# Patient Record
Sex: Male | Born: 1935 | Race: White | Hispanic: No | Smoking: Former smoker
Health system: Southern US, Community
[De-identification: ages and names within clinical notes are randomized; demographics above are authoritative.]

## PROBLEM LIST (undated history)

## (undated) DIAGNOSIS — Z86010 Personal history of colonic polyps: Secondary | ICD-10-CM

## (undated) DIAGNOSIS — I10 Essential (primary) hypertension: Secondary | ICD-10-CM

## (undated) DIAGNOSIS — T7840XA Allergy, unspecified, initial encounter: Secondary | ICD-10-CM

## (undated) DIAGNOSIS — C4491 Basal cell carcinoma of skin, unspecified: Secondary | ICD-10-CM

## (undated) DIAGNOSIS — E785 Hyperlipidemia, unspecified: Secondary | ICD-10-CM

## (undated) DIAGNOSIS — I2510 Atherosclerotic heart disease of native coronary artery without angina pectoris: Secondary | ICD-10-CM

## (undated) HISTORY — DX: Hyperlipidemia, unspecified: E78.5

## (undated) HISTORY — PX: COLONOSCOPY: SHX174

## (undated) HISTORY — DX: Atherosclerotic heart disease of native coronary artery without angina pectoris: I25.10

## (undated) HISTORY — DX: Allergy, unspecified, initial encounter: T78.40XA

## (undated) HISTORY — DX: Personal history of colonic polyps: Z86.010

## (undated) HISTORY — PX: MOHS SURGERY: SHX181

## (undated) HISTORY — PX: CATARACT EXTRACTION: SUR2

## (undated) HISTORY — DX: Essential (primary) hypertension: I10

## (undated) HISTORY — PX: EYE SURGERY: SHX253

## (undated) HISTORY — DX: Basal cell carcinoma of skin, unspecified: C44.91

## (undated) HISTORY — PX: HERNIA REPAIR: SHX51

---

## 1998-01-27 HISTORY — PX: CORONARY ARTERY BYPASS GRAFT: SHX141

## 1998-11-23 ENCOUNTER — Inpatient Hospital Stay (HOSPITAL_COMMUNITY): Admission: AD | Admit: 1998-11-23 | Discharge: 1998-12-03 | Payer: Self-pay | Admitting: Cardiology

## 1998-11-23 ENCOUNTER — Encounter: Payer: Self-pay | Admitting: Cardiology

## 1998-11-24 ENCOUNTER — Encounter: Payer: Self-pay | Admitting: Thoracic Surgery (Cardiothoracic Vascular Surgery)

## 1998-11-26 ENCOUNTER — Encounter: Payer: Self-pay | Admitting: Thoracic Surgery (Cardiothoracic Vascular Surgery)

## 1998-11-27 ENCOUNTER — Encounter: Payer: Self-pay | Admitting: Thoracic Surgery (Cardiothoracic Vascular Surgery)

## 1998-11-28 ENCOUNTER — Encounter: Payer: Self-pay | Admitting: Thoracic Surgery (Cardiothoracic Vascular Surgery)

## 1998-11-29 ENCOUNTER — Encounter: Payer: Self-pay | Admitting: Thoracic Surgery (Cardiothoracic Vascular Surgery)

## 1998-11-30 ENCOUNTER — Encounter: Payer: Self-pay | Admitting: Thoracic Surgery (Cardiothoracic Vascular Surgery)

## 1999-02-05 ENCOUNTER — Encounter (HOSPITAL_COMMUNITY): Admission: RE | Admit: 1999-02-05 | Discharge: 1999-05-06 | Payer: Self-pay | Admitting: Family Medicine

## 2000-03-24 ENCOUNTER — Encounter: Payer: Self-pay | Admitting: Surgery

## 2000-03-26 ENCOUNTER — Ambulatory Visit (HOSPITAL_COMMUNITY): Admission: RE | Admit: 2000-03-26 | Discharge: 2000-03-26 | Payer: Self-pay | Admitting: Surgery

## 2004-03-04 ENCOUNTER — Ambulatory Visit: Payer: Self-pay | Admitting: Family Medicine

## 2004-03-13 ENCOUNTER — Ambulatory Visit: Payer: Self-pay | Admitting: Internal Medicine

## 2004-03-19 ENCOUNTER — Ambulatory Visit: Payer: Self-pay | Admitting: Internal Medicine

## 2004-03-22 ENCOUNTER — Ambulatory Visit: Payer: Self-pay | Admitting: Internal Medicine

## 2004-05-27 ENCOUNTER — Ambulatory Visit: Payer: Self-pay | Admitting: Family Medicine

## 2004-12-05 ENCOUNTER — Ambulatory Visit: Payer: Self-pay | Admitting: Family Medicine

## 2005-01-30 ENCOUNTER — Encounter: Admission: RE | Admit: 2005-01-30 | Discharge: 2005-01-30 | Payer: Self-pay | Admitting: Family Medicine

## 2005-01-30 ENCOUNTER — Ambulatory Visit: Payer: Self-pay | Admitting: Family Medicine

## 2005-12-10 ENCOUNTER — Ambulatory Visit: Payer: Self-pay | Admitting: Family Medicine

## 2006-02-10 ENCOUNTER — Ambulatory Visit: Payer: Self-pay | Admitting: Family Medicine

## 2006-02-10 LAB — CONVERTED CEMR LAB
ALT: 15 units/L (ref 0–40)
AST: 20 units/L (ref 0–37)
Alkaline Phosphatase: 66 units/L (ref 39–117)
BUN: 12 mg/dL (ref 6–23)
Basophils Absolute: 0 10*3/uL (ref 0.0–0.1)
Basophils Relative: 0.3 % (ref 0.0–1.0)
Calcium: 9.5 mg/dL (ref 8.4–10.5)
Cholesterol: 122 mg/dL (ref 0–200)
Creatinine, Ser: 1.2 mg/dL (ref 0.4–1.5)
Eosinophils Relative: 0.6 % (ref 0.0–5.0)
Glucose, Bld: 91 mg/dL (ref 70–99)
HCT: 47.3 % (ref 39.0–52.0)
HDL: 35 mg/dL — ABNORMAL LOW (ref >39.0)
Hemoglobin: 15.8 g/dL (ref 13.0–17.0)
LDL Cholesterol: 59 mg/dL (ref 0–99)
Lymphocytes Relative: 26.1 % (ref 12.0–46.0)
MCHC: 33.4 g/dL (ref 30.0–36.0)
MCV: 91.7 fL (ref 78.0–100.0)
Monocytes Absolute: 0.4 10*3/uL (ref 0.2–0.7)
Monocytes Relative: 6.7 % (ref 3.0–11.0)
Neutro Abs: 4 10*3/uL (ref 1.4–7.7)
Neutrophils Relative %: 66.3 % (ref 43.0–77.0)
PSA: 2.92 ng/mL (ref 0.10–4.00)
Platelets: 256 10*3/uL (ref 150–400)
Potassium: 4.2 meq/L (ref 3.5–5.1)
RBC: 5.16 M/uL (ref 4.22–5.81)
RDW: 12.2 % (ref 11.5–14.6)
Sodium: 139 meq/L (ref 135–145)
TSH: 3.99 microintl units/mL (ref 0.35–5.50)
Total CHOL/HDL Ratio: 3.5
Triglycerides: 138 mg/dL (ref 0–149)
VLDL: 28 mg/dL (ref 0–40)
WBC: 5.9 10*3/uL (ref 4.5–10.5)

## 2006-02-17 ENCOUNTER — Ambulatory Visit: Payer: Self-pay | Admitting: Family Medicine

## 2006-05-14 ENCOUNTER — Ambulatory Visit: Payer: Self-pay | Admitting: Family Medicine

## 2006-12-03 ENCOUNTER — Ambulatory Visit: Payer: Self-pay | Admitting: Family Medicine

## 2007-02-22 ENCOUNTER — Ambulatory Visit: Payer: Self-pay | Admitting: Family Medicine

## 2007-02-22 DIAGNOSIS — I10 Essential (primary) hypertension: Secondary | ICD-10-CM | POA: Insufficient documentation

## 2007-02-22 DIAGNOSIS — E785 Hyperlipidemia, unspecified: Secondary | ICD-10-CM | POA: Insufficient documentation

## 2007-02-22 DIAGNOSIS — B3749 Other urogenital candidiasis: Secondary | ICD-10-CM | POA: Insufficient documentation

## 2007-02-22 LAB — CONVERTED CEMR LAB
ALT: 13 units/L (ref 0–53)
AST: 19 units/L (ref 0–37)
Albumin: 3.9 g/dL (ref 3.5–5.2)
Basophils Absolute: 0 10*3/uL (ref 0.0–0.1)
Basophils Relative: 0.1 % (ref 0.0–1.0)
Blood in Urine, dipstick: NEGATIVE
Calcium: 9.6 mg/dL (ref 8.4–10.5)
Chloride: 102 meq/L (ref 96–112)
Eosinophils Absolute: 0 10*3/uL (ref 0.0–0.6)
Eosinophils Relative: 0.4 % (ref 0.0–5.0)
GFR calc non Af Amer: 63 mL/min
HCT: 44.7 % (ref 39.0–52.0)
Hemoglobin: 15.2 g/dL (ref 13.0–17.0)
Lymphocytes Relative: 28.1 % (ref 12.0–46.0)
MCHC: 34.1 g/dL (ref 30.0–36.0)
Monocytes Relative: 8.1 % (ref 3.0–11.0)
Nitrite: NEGATIVE
PSA: 4.1 ng/mL — ABNORMAL HIGH (ref 0.10–4.00)
Protein, U semiquant: NEGATIVE
RDW: 11.9 % (ref 11.5–14.6)
Specific Gravity, Urine: 1.025
TSH: 3.85 microintl units/mL (ref 0.35–5.50)
WBC Urine, dipstick: NEGATIVE

## 2007-03-02 ENCOUNTER — Ambulatory Visit: Payer: Self-pay | Admitting: Family Medicine

## 2007-03-02 DIAGNOSIS — I25810 Atherosclerosis of coronary artery bypass graft(s) without angina pectoris: Secondary | ICD-10-CM

## 2007-03-02 DIAGNOSIS — I25701 Atherosclerosis of coronary artery bypass graft(s), unspecified, with angina pectoris with documented spasm: Secondary | ICD-10-CM | POA: Insufficient documentation

## 2007-10-13 ENCOUNTER — Telehealth: Payer: Self-pay | Admitting: Family Medicine

## 2007-11-10 ENCOUNTER — Ambulatory Visit: Payer: Self-pay | Admitting: Family Medicine

## 2008-03-02 ENCOUNTER — Ambulatory Visit: Payer: Self-pay | Admitting: Family Medicine

## 2008-03-09 ENCOUNTER — Ambulatory Visit: Payer: Self-pay | Admitting: Internal Medicine

## 2008-09-14 DIAGNOSIS — S61209A Unspecified open wound of unspecified finger without damage to nail, initial encounter: Secondary | ICD-10-CM | POA: Insufficient documentation

## 2008-09-28 ENCOUNTER — Ambulatory Visit: Payer: Self-pay | Admitting: Family Medicine

## 2008-10-03 ENCOUNTER — Ambulatory Visit: Payer: Self-pay | Admitting: Family Medicine

## 2008-10-03 ENCOUNTER — Encounter: Payer: Self-pay | Admitting: Family Medicine

## 2008-10-05 DIAGNOSIS — D2330 Other benign neoplasm of skin of unspecified part of face: Secondary | ICD-10-CM

## 2008-10-16 DIAGNOSIS — L0201 Cutaneous abscess of face: Secondary | ICD-10-CM

## 2008-10-16 DIAGNOSIS — L03211 Cellulitis of face: Secondary | ICD-10-CM

## 2008-10-18 ENCOUNTER — Ambulatory Visit: Payer: Self-pay | Admitting: Family Medicine

## 2008-10-31 ENCOUNTER — Ambulatory Visit: Payer: Self-pay | Admitting: Family Medicine

## 2008-11-07 ENCOUNTER — Ambulatory Visit: Payer: Self-pay | Admitting: Family Medicine

## 2009-03-27 ENCOUNTER — Ambulatory Visit: Payer: Self-pay | Admitting: Family Medicine

## 2009-03-27 DIAGNOSIS — N400 Enlarged prostate without lower urinary tract symptoms: Secondary | ICD-10-CM | POA: Insufficient documentation

## 2009-04-02 ENCOUNTER — Telehealth (INDEPENDENT_AMBULATORY_CARE_PROVIDER_SITE_OTHER): Payer: Self-pay | Admitting: Radiology

## 2009-04-03 ENCOUNTER — Ambulatory Visit: Payer: Self-pay

## 2009-04-03 ENCOUNTER — Ambulatory Visit: Payer: Self-pay | Admitting: Cardiovascular Disease

## 2009-04-03 ENCOUNTER — Encounter (HOSPITAL_COMMUNITY): Admission: RE | Admit: 2009-04-03 | Discharge: 2009-05-29 | Payer: Self-pay | Admitting: Family Medicine

## 2009-04-13 ENCOUNTER — Encounter (INDEPENDENT_AMBULATORY_CARE_PROVIDER_SITE_OTHER): Payer: Self-pay | Admitting: *Deleted

## 2009-04-30 ENCOUNTER — Encounter (INDEPENDENT_AMBULATORY_CARE_PROVIDER_SITE_OTHER): Payer: Self-pay

## 2009-05-01 ENCOUNTER — Ambulatory Visit: Payer: Self-pay | Admitting: Internal Medicine

## 2009-05-16 ENCOUNTER — Ambulatory Visit: Payer: Self-pay | Admitting: Internal Medicine

## 2009-12-10 ENCOUNTER — Ambulatory Visit: Payer: Self-pay | Admitting: Family Medicine

## 2010-02-24 LAB — CONVERTED CEMR LAB
ALT: 15 units/L (ref 0–53)
AST: 24 units/L (ref 0–37)
Albumin: 3.9 g/dL (ref 3.5–5.2)
Alkaline Phosphatase: 63 units/L (ref 39–117)
BUN: 13 mg/dL (ref 6–23)
BUN: 15 mg/dL (ref 6–23)
Basophils Absolute: 0 10*3/uL (ref 0.0–0.1)
Bilirubin Urine: NEGATIVE
Bilirubin, Direct: 0.1 mg/dL (ref 0.0–0.3)
Bilirubin, Direct: 0.2 mg/dL (ref 0.0–0.3)
Blood in Urine, dipstick: NEGATIVE
Blood in Urine, dipstick: NEGATIVE
Chloride: 114 meq/L — ABNORMAL HIGH (ref 96–112)
Cholesterol: 120 mg/dL (ref 0–200)
Creatinine, Ser: 1.1 mg/dL (ref 0.4–1.5)
Eosinophils Absolute: 0 10*3/uL (ref 0.0–0.7)
Eosinophils Relative: 0.4 % (ref 0.0–5.0)
Eosinophils Relative: 0.6 % (ref 0.0–5.0)
GFR calc non Af Amer: 69.57 mL/min (ref >60)
GFR calc non Af Amer: 78 mL/min
Glucose, Bld: 99 mg/dL (ref 70–99)
Glucose, Urine, Semiquant: NEGATIVE
HDL: 32.5 mg/dL — ABNORMAL LOW (ref >39.0)
HDL: 32.7 mg/dL — ABNORMAL LOW (ref >39.0)
Ketones, urine, test strip: NEGATIVE
LDL Cholesterol: 57 mg/dL (ref 0–99)
Lymphs Abs: 1.2 10*3/uL (ref 0.7–4.0)
MCV: 90.3 fL (ref 78.0–100.0)
MCV: 93.3 fL (ref 78.0–100.0)
Monocytes Absolute: 0.3 10*3/uL (ref 0.1–1.0)
Monocytes Relative: 7 % (ref 3.0–12.0)
Neutrophils Relative %: 67 % (ref 43.0–77.0)
Neutrophils Relative %: 67.7 % (ref 43.0–77.0)
Nitrite: NEGATIVE
PSA: 3.74 ng/mL (ref 0.10–4.00)
Platelets: 197 10*3/uL (ref 150.0–400.0)
Platelets: 210 10*3/uL (ref 150–400)
Potassium: 4.5 meq/L (ref 3.5–5.1)
Protein, U semiquant: NEGATIVE
RDW: 12.3 % (ref 11.5–14.6)
RDW: 12.3 % (ref 11.5–14.6)
Sodium: 144 meq/L (ref 135–145)
TSH: 3.22 microintl units/mL (ref 0.35–5.50)
Total Bilirubin: 0.9 mg/dL (ref 0.3–1.2)
Total Bilirubin: 0.9 mg/dL (ref 0.3–1.2)
Total CHOL/HDL Ratio: 3.8
Triglycerides: 126 mg/dL (ref 0.0–149.0)
Triglycerides: 137 mg/dL (ref 0–149)
Urobilinogen, UA: 0.2
VLDL: 22 mg/dL (ref 0–40)
VLDL: 25.2 mg/dL (ref 0.0–40.0)
WBC Urine, dipstick: NEGATIVE
WBC: 4.9 10*3/uL (ref 4.5–10.5)
WBC: 5.4 10*3/uL (ref 4.5–10.5)
pH: 6.5
pH: 6.5

## 2010-02-26 NOTE — Assessment & Plan Note (Signed)
Summary: FLU SHOT // RS  Flu Vaccine Consent Questions     Do you have a history of severe allergic reactions to this vaccine? no    Any prior history of allergic reactions to egg and/or gelatin? no    Do you have a sensitivity to the preservative Thimersol? no    Do you have a past history of Guillan-Barre Syndrome? no    Do you currently have an acute febrile illness? no    Have you ever had a severe reaction to latex? no    Vaccine information given and explained to patient? yes    Are you currently pregnant? no    Lot Number:[id number]   Exp Date:07/27/2010   Site Given  Left Deltoid IM  Nurse Visit   Allergies: 1)  ! * Bee Sting  Orders Added: 1)  Flu Vaccine 26yrs + MEDICARE PATIENTS [Q2039] 2)  Administration Flu vaccine - MCR [G0008]

## 2010-02-26 NOTE — Assessment & Plan Note (Signed)
Summary: emp -will fast//ccm   Vital Signs:  Patient profile:   77 year old male Weight:      180 pounds BMI:     28.08 Temp:     97.9 degrees F oral Pulse rate:   48 / minute Pulse rhythm:   regular BP sitting:   104 / 70  (left arm) Cuff size:   regular  Vitals Entered By: Raechel Ache, RN (March 27, 2009 9:15 AM) CC: OV, fasting.   CC:  OV and fasting.[name]  History of Present Illness: Robert Pacheco is a 77 year old, married male, nonsmoker, who comes in today for multiple issues.  He is a history of underlying coronary artery disease.  He spent asymptomatic.  No chest pain.  He has a history of hypertension, for which he takes a beta-blocker, 50 mg one half tablet b.i.d.  He takes Zocor 20 mg nightly one half tablet for hyperlipidemia.  Will check lipid panel today.  He also takes aspirin, niacin, saw palmetto.  He also use Nizoral for fungal infections in the groin area.  Tetanus booster 2006 Pneumovax 2009 seasonal flu 2000 and  His last cardiac stress test was about 3 years ago.  His heart surgery was about 11 years ago.  He denies any chest pain, shortness of breath, decreased exercise tolerance, etc.  Allergies: 1)  ! * Bee Sting  Past History:  Past medical, surgical, family and social histories (including risk factors) reviewed, and no changes noted (except as noted below).  Past Medical History: Reviewed history from 03/02/2007 and no changes required. Hyperlipidemia Hypertension cabs 2000 allergic rhinitis  Past Surgical History: Reviewed history from 03/02/2008 and no changes required. Coronary artery bypass graft   2000  Family History: Reviewed history from 03/02/2007 and no changes required. father died at 20 of an MI.  Smoker mother died 43 motor vehicle accident 3 brothers in good health 5 sisters in good health  Social History: Reviewed history from 02/22/2007 and no changes required. Retired Married Never Smoked Alcohol use-no Drug  use-no Regular exercise-yes  Review of Systems      See HPI  Physical Exam  General:  Well-developed,well-nourished,in no acute distress; alert,appropriate and cooperative throughout examination Head:  Normocephalic and atraumatic without obvious abnormalities. No apparent alopecia or balding. Eyes:  No corneal or conjunctival inflammation noted. EOMI. Perrla. Funduscopic exam benign, without hemorrhages, exudates or papilledema. Vision grossly normal. Ears:  External ear exam shows no significant lesions or deformities.  Otoscopic examination reveals clear canals, tympanic membranes are intact bilaterally without bulging, retraction, inflammation or discharge. Hearing is grossly normal bilaterally. Nose:  External nasal examination shows no deformity or inflammation. Nasal mucosa are pink and moist without lesions or exudates. Mouth:  Oral mucosa and oropharynx without lesions or exudates.  Teeth in good repair. Neck:  No deformities, masses, or tenderness noted. Chest Wall:  No deformities, masses, tenderness or gynecomastia noted. Breasts:  No masses or gynecomastia noted Lungs:  Normal respiratory effort, chest expands symmetrically. Lungs are clear to auscultation, no crackles or wheezes. Heart:  Normal rate and regular rhythm. S1 and S2 normal without gallop, murmur, click, rub or other extra sounds. Abdomen:  Bowel sounds positive,abdomen soft and non-tender without masses, organomegaly or hernias noted. Rectal:  No external abnormalities noted. Normal sphincter tone. No rectal masses or tenderness. Genitalia:  Testes bilaterally descended without nodularity, tenderness or masses. No scrotal masses or lesions. No penis lesions or urethral discharge. Prostate:  no nodules, no asymmetry, and  2+ enlarged.   Msk:  No deformity or scoliosis noted of thoracic or lumbar spine.   Pulses:  R and L carotid,radial,femoral,dorsalis pedis and posterior tibial pulses are full and equal  bilaterally Extremities:  No clubbing, cyanosis, edema, or deformity noted with normal full range of motion of all joints.   Neurologic:  No cranial nerve deficits noted. Station and gait are normal. Plantar reflexes are down-going bilaterally. DTRs are symmetrical throughout. Sensory, motor and coordinative functions appear intact. Skin:  Intact without suspicious lesions or rashes Cervical Nodes:  No lymphadenopathy noted Axillary Nodes:  No palpable lymphadenopathy Inguinal Nodes:  No significant adenopathy Psych:  Cognition and judgment appear intact. Alert and cooperative with normal attention span and concentration. No apparent delusions, illusions, hallucinations   Impression & Recommendations:  Problem # 1:  CORONARY ATHEROSCLEROSIS OF ARTERY BYPASS GRAFT (ICD-414.04) Assessment Unchanged  His updated medication list for this problem includes:    Metoprolol Succinate 50 Mg Tb24 (Metoprolol succinate) .[name]... 1/2 am 1/2 pm    Adult Aspirin Low Strength 81 Mg Tbdp (Aspirin)    Nitrostat 0.4 Mg Subl (Nitroglycerin) ..... Uad  Orders: Cardiology Referral (Cardiology) Prescription Created Electronically [phone]) UA Dipstick w/o Micro (automated)  (81003) Venipuncture (98119) TLB-Lipid Panel (80061-LIPID) TLB-BMP (Basic Metabolic Panel-BMET) (80048-METABOL) TLB-CBC Platelet - w/Differential (85025-CBCD) TLB-Hepatic/Liver Function Pnl (80076-HEPATIC) TLB-TSH (Thyroid Stimulating Hormone) (84443-TSH) TLB-PSA (Prostate Specific Antigen) (84153-PSA)  Problem # 2:  HYPERTENSION (ICD-401.9) Assessment: Improved  His updated medication list for this problem includes:    Metoprolol Succinate 50 Mg Tb24 (Metoprolol succinate) .[name]... 1/2 am 1/2 pm  Orders: Prescription Created Electronically [phone]) UA Dipstick w/o Micro (automated)  (81003) Venipuncture (95621) TLB-Lipid Panel (80061-LIPID) TLB-BMP (Basic Metabolic Panel-BMET) (80048-METABOL) TLB-CBC Platelet - w/Differential  (85025-CBCD) TLB-Hepatic/Liver Function Pnl (80076-HEPATIC) TLB-TSH (Thyroid Stimulating Hormone) (84443-TSH) TLB-PSA (Prostate Specific Antigen) (84153-PSA) EKG w/ Interpretation (93000)  Problem # 3:  HYPERLIPIDEMIA (ICD-272.4) Assessment: Improved  His updated medication list for this problem includes:    Zocor 20 Mg Tabs (Simvastatin) .[name]... 1/2 tab once daily    Niacin 500 Mg Tabs (Niacin) .[name]... Take 1 tablet by mouth once a day otc  Orders: Prescription Created Electronically [phone]) UA Dipstick w/o Micro (automated)  (81003) Venipuncture (78469) TLB-Lipid Panel (80061-LIPID) TLB-BMP (Basic Metabolic Panel-BMET) (80048-METABOL) TLB-CBC Platelet - w/Differential (85025-CBCD) TLB-Hepatic/Liver Function Pnl (80076-HEPATIC) TLB-TSH (Thyroid Stimulating Hormone) (84443-TSH) TLB-PSA (Prostate Specific Antigen) (84153-PSA)  Problem # 4:  CANDIDIASIS OF OTHER UROGENITAL SITES (ICD-112.2) Assessment: Improved  Orders: Prescription Created Electronically [phone]) UA Dipstick w/o Micro (automated)  (81003) Venipuncture (84132) TLB-Lipid Panel (80061-LIPID) TLB-BMP (Basic Metabolic Panel-BMET) (80048-METABOL) TLB-CBC Platelet - w/Differential (85025-CBCD) TLB-Hepatic/Liver Function Pnl (80076-HEPATIC) TLB-TSH (Thyroid Stimulating Hormone) (84443-TSH) TLB-PSA (Prostate Specific Antigen) (84153-PSA)  Problem # 5:  BENIGN PROSTATIC HYPERTROPHY, WITH OBSTRUCTION (ICD-600.01) Assessment: New  Orders: Prescription Created Electronically [phone]) UA Dipstick w/o Micro (automated)  (81003) Venipuncture (27253) TLB-Lipid Panel (80061-LIPID) TLB-BMP (Basic Metabolic Panel-BMET) (80048-METABOL) TLB-CBC Platelet - w/Differential (85025-CBCD) TLB-Hepatic/Liver Function Pnl (80076-HEPATIC) TLB-TSH (Thyroid Stimulating Hormone) (84443-TSH) TLB-PSA (Prostate Specific Antigen) (84153-PSA)  Complete Medication List: 1)  Metoprolol Succinate 50 Mg Tb24 (Metoprolol succinate) .... 1/2 am 1/2  pm 2)  Zocor 20 Mg Tabs (Simvastatin) .... 1/2 tab once daily 3)  Adult Aspirin Low Strength 81 Mg Tbdp (Aspirin) 4)  Fish Oil  .... Two times a day otc 5)  Niacin 500 Mg Tabs (Niacin) .... Take 1 tablet by mouth once a day otc 6)  Saw Palmetto 450  .... Two times a day  otc 7)  Ketoconazole 2 % Crea (Ketoconazole) .... Apply b.i.d. 8)  Nitrostat 0.4 Mg Subl (Nitroglycerin) .... Uad  Patient Instructions: 1)  Please schedule a follow-up appointment in 1 year. 2)  It is important that you exercise regularly at least 20 minutes 5 times a week. If you develop chest pain, have severe difficulty breathing, or feel very tired , stop exercising immediately and seek medical attention. 3)  Schedule a colonoscopy/sigmoidoscopy to help detect colon cancer. 4)  Take an Aspirin every day 5)  we will get to set up for a cardiac stress test. Prescriptions: NITROSTAT 0.4 MG  SUBL (NITROGLYCERIN) UAD  #25 x 1   Entered and Authorized by:   Roderick Pee MD   Signed by:   Roderick Pee MD on 03/27/2009   Method used:   Electronically to        [facility] [phone]* (retail)       [address] [place], [place]  [address]       Ph: [phone]       Fax: [phone]   RxID:   [id number] KETOCONAZOLE 2 %  CREA (KETOCONAZOLE) apply b.i.d.  #60gr. x 3   Entered and Authorized by:   Roderick Pee MD   Signed by:   Roderick Pee MD on 03/27/2009   Method used:   Electronically to        [facility] [phone]* (retail)       [address] [place], [place]  [address]       Ph: [phone]       Fax: [phone]   RxID:   [id number] NIACIN 500 MG  TABS (NIACIN) Take 1 tablet by mouth once a day otc  #100 x 3   Entered and Authorized by:   Roderick Pee MD   Signed by:   Roderick Pee MD on 03/27/2009   Method used:   Electronically to        [facility] [phone]* (retail)       [address] [place], [place]  [address]       Ph: [phone]       Fax: [phone]    RxID:   [id number] ZOCOR 20 MG  TABS (SIMVASTATIN) 1/2 TAB once daily  #50 x 3   Entered and Authorized by:   Roderick Pee MD   Signed by:   Roderick Pee MD on 03/27/2009   Method used:   Electronically to        [facility] [phone]* (retail)       [address] [place], [place]  [address]       Ph: [phone]       Fax: [phone]   RxID:   [id number] METOPROLOL SUCCINATE 50 MG  TB24 (METOPROLOL SUCCINATE) 1/2 AM 1/2 PM  #100 x 3   Entered and Authorized by:   Roderick Pee MD   Signed by:   Roderick Pee MD on 03/27/2009   Method used:   Electronically to        [facility] [phone]* (retail)       [address] [place], [place]  [address]       Ph: [phone]  Fax: [phone]   RxID:   [id number]     Laboratory Results   Urine Tests  Date/Time Recieved: March 27, 2009 1:23 PM  Date/Time Reported: March 27, 2009 1:23 PM   Routine Urinalysis   Color: yellow Appearance: Clear Glucose: negative   (Normal Range: Negative) Bilirubin: negative   (Normal Range: Negative) Ketone: negative   (Normal Range: Negative) Spec. Gravity: 1.020   (Normal Range: 1.003-1.035) Blood: negative   (Normal Range: Negative) pH: 6.5   (Normal Range: 5.0-8.0) Protein: negative   (Normal Range: Negative) Urobilinogen: 0.2   (Normal Range: 0-1) Nitrite: negative   (Normal Range: Negative) Leukocyte Esterace: negative   (Normal Range: Negative)    Comments: Wynona Canes, CMA  March 27, 2009 1:23 PM

## 2010-02-26 NOTE — Letter (Signed)
Summary: [facility] Instructions  [facility]  [address] [place], [place] [address]   Phone: [phone]  Fax: [phone]       Robert Pacheco    [birth year]    MRN: [id number]        Procedure Day /Date:  05/16/09  Wednesday     Arrival Time:  7:30am      Procedure Time: 8:30am     Location of Procedure:                    _ x_  [facility] (4th Floor)   PREPARATION FOR COLONOSCOPY WITH MOVIPREP   Starting 5 days prior to your procedure _ 4/15/11_ do not eat nuts, seeds, popcorn, corn, beans, peas,  salads, or any raw vegetables.  Do not take any fiber supplements (e.g. Metamucil, Citrucel, and Benefiber).  THE DAY BEFORE YOUR PROCEDURE         DATE:  05/15/09  DAY:  Tuesday  1.  Drink clear liquids the entire day-NO SOLID FOOD  2.  Do not drink anything colored red or purple.  Avoid juices with pulp.  No orange juice.  3.  Drink at least 64 oz. (8 glasses) of fluid/clear liquids during the day to prevent dehydration and help the prep work efficiently.  CLEAR LIQUIDS INCLUDE: Water Jello Ice Popsicles Tea (sugar ok, no milk/cream) Powdered fruit flavored drinks Coffee (sugar ok, no milk/cream) Gatorade Juice: apple, white grape, white cranberry  Lemonade Clear bullion, consomm, broth Carbonated beverages (any kind) Strained chicken noodle soup Hard Candy                             4.  In the morning, mix first dose of MoviPrep solution:    Empty 1 Pouch A and 1 Pouch B into the disposable container    Add lukewarm drinking water to the top line of the container. Mix to dissolve    Refrigerate (mixed solution should be used within 24 hrs)  5.  Begin drinking the prep at 5:00 p.m. The MoviPrep container is divided by 4 marks.   Every 15 minutes drink the solution down to the next mark (approximately 8 oz) until the full liter is complete.   6.  Follow completed prep with 16 oz of clear liquid of your choice (Nothing red or purple).   Continue to drink clear liquids until bedtime.  7.  Before going to bed, mix second dose of MoviPrep solution:    Empty 1 Pouch A and 1 Pouch B into the disposable container    Add lukewarm drinking water to the top line of the container. Mix to dissolve    Refrigerate  THE DAY OF YOUR PROCEDURE      DATE:  05/16/09  DAY:  Wednesday  Beginning at 4:30 a.m. (5 hours before procedure):         1. Every 15 minutes, drink the solution down to the next mark (approx 8 oz) until the full liter is complete.  2. Follow completed prep with 16 oz. of clear liquid of your choice.    3. You may drink clear liquids until   6:30am (2 HOURS BEFORE PROCEDURE).   MEDICATION INSTRUCTIONS  Unless otherwise instructed, you should take regular prescription medications with a small sip of water   as early as possible the morning of your procedure.        OTHER INSTRUCTIONS  You will need a responsible adult at least 75 years of age to accompany you and drive you home.   This person must remain in the waiting room during your procedure.  Wear loose fitting clothing that is easily removed.  Leave jewelry and other valuables at home.  However, you may wish to bring a book to read or  an iPod/MP3 player to listen to music as you wait for your procedure to start.  Remove all body piercing jewelry and leave at home.  Total time from sign-in until discharge is approximately 2-3 hours.  You should go home directly after your procedure and rest.  You can resume normal activities the  day after your procedure.  The day of your procedure you should not:   Drive   Make legal decisions   Operate machinery   Drink alcohol   Return to work  You will receive specific instructions about eating, activities and medications before you leave.    The above instructions have been reviewed and explained to me by   [name]  May 01, 2009 9:22 AM     I fully understand and can verbalize  these instructions _____________________________ Date _________

## 2010-02-26 NOTE — Assessment & Plan Note (Signed)
Summary: Cardioogy Nuclear Study  Nuclear Med Background Indications for Stress Test: Evaluation for Ischemia, Graft Patency   History: CABG, GXT, Heart Catheterization  History Comments: 2000-Cath-3V disease- CABG(X4) 03/09/08- Negative GXT  Symptoms: Palpitations    Nuclear Pre-Procedure Cardiac Risk Factors: Family History - CAD, Hypertension, Lipids Caffeine/Decaff Intake: none NPO After: 6:00 PM Lungs: clear IV 0.9% NS with Angio Cath: 20g     IV Site: (R) wrist IV Started by: Stanton Kidney EMT-P Chest Size (in) 40     Height (in): 68 Weight (lb): 178 BMI: 27.16  Nuclear Med Study 1 or 2 day study:  1 day     Stress Test Type:  Stress Reading MD:  Charlton Haws, MD     Referring MD:  J.Todd Resting Radionuclide:  Technetium 45m Tetrofosmin     Resting Radionuclide Dose:  11.0 mCi  Stress Radionuclide:  Technetium 88m Tetrofosmin     Stress Radionuclide Dose:  33.0 mCi   Stress Protocol Exercise Time (min):  6:00 min     Max HR:  129 bpm     Predicted Max HR:  147 bpm  Max Systolic BP: 207 mm Hg     Percent Max HR:  87.76 %     METS: 7.0 Rate Pressure Product:  04540    Stress Test Technologist:  Milana Na EMT-P     Nuclear Technologist:  Burna Mortimer Deal RT-N  Rest Procedure  Myocardial perfusion imaging was performed at rest 45 minutes following the intravenous administration of Myoview Technetium 32m Tetrofosmin.  Stress Procedure  The patient exercised for 6:00. The patient stopped due to fatigue and denied any chest pain.  There were non specific ST-T wave changes and occ pacs/pvcs in pretest and recovery.  Myoview was injected at peak exercise and myocardial perfusion imaging was performed after a brief delay.  QPS Raw Data Images:  Normal; no motion artifact; normal heart/lung ratio. Stress Images:  NI: Uniform and normal uptake of tracer in all myocardial segments. Rest Images:  Normal homogeneous uptake in all areas of the myocardium. Subtraction (SDS):   Normal Transient Ischemic Dilatation:  1.00  (Normal <1.22)  Lung/Heart Ratio:  .31  (Normal <0.45)  Quantitative Gated Spect Images QGS EDV:  77 ml QGS ESV:  23 ml QGS EF:  70 % QGS cine images:  normal  Findings Normal nuclear study      Overall Impression  Exercise Capacity: Fair exercise capacity. BP Response: Hypertensive blood pressure response. Clinical Symptoms: No chest pain ECG Impression: No significant ST segment change suggestive of ischemia. Overall Impression: Normal stress nuclear study.  Appended Document: Cardioogy Nuclear Study  please call cardiac stress test, normal  Appended Document: Cardioogy Nuclear Study patient is aware

## 2010-02-26 NOTE — Letter (Signed)
Summary: Previsit letter  [facility]  [address] [place], [place] [address]   Phone: [phone]  Fax: [phone]       04/13/2007 MRN: [id number]  Robert Pacheco [address] [place], [place]  [address]  Dear [name],  Welcome to the Gastroenterology Division at [facility].    You are scheduled to see a nurse for your pre-procedure visit on 05/01/2009 at 9:00AM on the 3rd floor at [facility], [address]  We ask that you try to arrive at our office 15 minutes prior to your appointment time to allow for check-in.  Your nurse visit will consist of discussing your medical and surgical history, your immediate family medical history, and your medications.    Please bring a complete list of all your medications or, if you prefer, bring the medication bottles and we will list them.  We will need to be aware of both prescribed and over the counter drugs.  We will need to know exact dosage information as well.  If you are on blood thinners (Coumadin, Plavix, Aggrenox, Ticlid, etc.) please call our office today/prior to your appointment, as we need to consult with your physician about holding your medication.   Please be prepared to read and sign documents such as consent forms, a financial agreement, and acknowledgement forms.  If necessary, and with your consent, a friend or relative is welcome to sit-in on the nurse visit with you.  Please bring your insurance card so that we may make a copy of it.  If your insurance requires a referral to see a specialist, please bring your referral form from your primary care physician.  No co-pay is required for this nurse visit.     If you cannot keep your appointment, please call [phone] to cancel or reschedule prior to your appointment date.  This allows [place] the opportunity to schedule an appointment for another patient in need of care.    Thank you for choosing [facility] for your medical needs.   We appreciate the opportunity to care for you.  Please visit [place] at our website  to learn more about our practice.                     Sincerely.                                                                                                                   The Gastroenterology Division

## 2010-02-26 NOTE — Progress Notes (Signed)
Summary: Nuc Pre-Procedure  Phone Note Outgoing Call   Call placed by: Harlow Asa, CNMT Call placed to: Patient Reason for Call: Confirm/change Appt Summary of Call: Reviewed information on Myoview Information Sheet (see scanned document for further details).  Spoke with patient.     Nuclear Med Background Indications for Stress Test: Evaluation for Ischemia, Graft Patency   History: CABG, GXT, Heart Catheterization  History Comments: 2000-Cath-3V disease- CABG(X4) 03/09/08- Negative GXT     Nuclear Pre-Procedure Cardiac Risk Factors: Family History - CAD, Hypertension, Lipids Height (in): 67.25

## 2010-02-26 NOTE — Miscellaneous (Signed)
Summary: Lec previsit  Clinical Lists Changes  Medications: Added new medication of MOVIPREP 100 GM  SOLR (PEG-KCL-NACL-NASULF-NA ASC-C) As per prep instructions. - Signed Rx of MOVIPREP 100 GM  SOLR (PEG-KCL-NACL-NASULF-NA ASC-C) As per prep instructions.;  #1 x 0;  Signed;  Entered by: [name];  Authorized by: Iva Boop MD, [facility];  Method used: Electronically to [id number]*, [address] [place], [place]  [address], Ph: [phone], Fax: [phone] Observations: Added new observation of ALLERGY REV: Done (05/01/2009 8:48)    Prescriptions: MOVIPREP 100 GM  SOLR (PEG-KCL-NACL-NASULF-NA ASC-C) As per prep instructions.  #1 x 0   Entered by:   [name]   Authorized by:   Iva Boop MD, [facility]   Signed by:   [name] on 05/01/2009   Method used:   Electronically to        [facility] [phone]* (retail)       [address] [place], [place]  [address]       Ph: [phone]       Fax: [phone]   RxID:   [id number]

## 2010-02-26 NOTE — Procedures (Signed)
Summary: Colonoscopy  Patient: [name] Note: All result statuses are Final unless otherwise noted.  Tests: (1) Colonoscopy (COL)   COL Colonoscopy           DONE     [facility]     [address]     [place], [place]  [address]           COLONOSCOPY PROCEDURE REPORT           PATIENT:  Robert Pacheco, Robert Pacheco  MR#:  [id number]     BIRTHDATE:  08/06/1933, 76 yrs. old  GENDER:  male     ENDOSCOPIST:  Iva Boop, MD, [facility]           PROCEDURE DATE:  05/16/2009     PROCEDURE:  Colonoscopy with snare polypectomy     ASA CLASS:  Class II     INDICATIONS:  surveillance and high-risk screening, history of     pre-cancerous (adenomatous) colon polyps tubulovillous adenoma     removed 2006     MEDICATIONS:   Fentanyl 50 mcg IV, Versed 7 mg IV           DESCRIPTION OF PROCEDURE:   After the risks benefits and     alternatives of the procedure were thoroughly explained, informed     consent was obtained.  Digital rectal exam was performed and     revealed an enlarged prostate and no rectal masses.  Smooth,     moderately enlarged prostate. The LB CF-H180AL [id number] endoscope     was introduced through the anus and advanced to the cecum, which     was identified by both the appendix and ileocecal valve, without     limitations.  The quality of the prep was excellent, using     MoviPrep.  The instrument was then slowly withdrawn as the colon     was fully examined. Insertion: 6:40 minutes Withdrawal: 12:10     minutes     <<PROCEDUREIMAGES>>           FINDINGS:  There were multiple polyps identified and removed.     throughout the colon. Six polyps removed from cecum (1), hepatic     flexure (2), transverse colon (1), splenic flexure (1) and     descending colon (1). Largest 8 mm, smallest 1-2 mm (that one was     not recovered) Polyps were snared without cautery. Retrieval was     successful. snare polyp  This was otherwise a normal examination     of the colon.   Retroflexed  views in the rectum revealed no     abnormalities.    The scope was then withdrawn from the patient     and the procedure completed.           COMPLICATIONS:  None     ENDOSCOPIC IMPRESSION:     1) Polyps, multiple throughout the colon - six removed and five     recovered     2) Otherwise normal examination, excellent prep           3) Personal history of tubulovillous adenoma removal 2006.           REPEAT EXAM:  In for Colonoscopy, pending biopsy results.           Iva Boop, MD, [facility]           CC:  The Patient           n.  eSIGNED:   Iva Boop at 05/16/2009 09:28 AM           [name], [id number]  Note: An exclamation mark (!) indicates a result that was not dispersed into the flowsheet. Document Creation Date: 05/16/2009 9:29 AM _______________________________________________________________________  (1) Order result status: Final Collection or observation date-time: 05/16/2009 09:14 Requested date-time:  Receipt date-time:  Reported date-time:  Referring Physician:   Ordering Physician: Stan Head [phone]) Specimen Source:  Source: Launa Grill Order Number: [id number] Lab site:   Appended Document: Colonoscopy: 6 adenomas   Colonoscopy  Procedure date:  05/16/2009  Findings:      1) Polyps, multiple throughout the colon - six removed and five     recovered ADENOMAS     2) Otherwise normal examination, excellent prep           3) Personal history of tubulovillous adenoma removal 2006  Comments:      Repeat colonoscopy in 3 years.     Procedures Next Due Date:    Colonoscopy: 05/2012   Appended Document: Colonoscopy     Procedures Next Due Date:    Colonoscopy: 05/2012

## 2010-04-09 ENCOUNTER — Ambulatory Visit (INDEPENDENT_AMBULATORY_CARE_PROVIDER_SITE_OTHER): Payer: Medicare Other | Admitting: Family Medicine

## 2010-04-09 ENCOUNTER — Encounter: Payer: Self-pay | Admitting: Family Medicine

## 2010-04-09 DIAGNOSIS — D2330 Other benign neoplasm of skin of unspecified part of face: Secondary | ICD-10-CM

## 2010-04-09 DIAGNOSIS — I10 Essential (primary) hypertension: Secondary | ICD-10-CM

## 2010-04-09 DIAGNOSIS — N401 Enlarged prostate with lower urinary tract symptoms: Secondary | ICD-10-CM

## 2010-04-09 DIAGNOSIS — E785 Hyperlipidemia, unspecified: Secondary | ICD-10-CM

## 2010-04-09 DIAGNOSIS — I25810 Atherosclerosis of coronary artery bypass graft(s) without angina pectoris: Secondary | ICD-10-CM

## 2010-04-09 LAB — POCT URINALYSIS DIPSTICK
Bilirubin, UA: NEGATIVE
Blood, UA: NEGATIVE
Glucose, UA: NEGATIVE
Leukocytes, UA: NEGATIVE
Nitrite, UA: NEGATIVE

## 2010-04-09 LAB — CBC WITH DIFFERENTIAL/PLATELET
Basophils Relative: 0.3 % (ref 0.0–3.0)
Eosinophils Relative: 0.5 % (ref 0.0–5.0)
HCT: 45.5 % (ref 39.0–52.0)
MCV: 92.2 fl (ref 78.0–100.0)
Monocytes Absolute: 0.5 10*3/uL (ref 0.1–1.0)
Neutrophils Relative %: 69.8 % (ref 43.0–77.0)
RBC: 4.94 Mil/uL (ref 4.22–5.81)
WBC: 6.6 10*3/uL (ref 4.5–10.5)

## 2010-04-09 LAB — LIPID PANEL
Total CHOL/HDL Ratio: 4
Triglycerides: 163 mg/dL — ABNORMAL HIGH (ref 0.0–149.0)

## 2010-04-09 LAB — HEPATIC FUNCTION PANEL
ALT: 15 U/L (ref 0–53)
AST: 21 U/L (ref 0–37)
Bilirubin, Direct: 0.1 mg/dL (ref 0.0–0.3)
Total Protein: 6.9 g/dL (ref 6.0–8.3)

## 2010-04-09 LAB — BASIC METABOLIC PANEL
Chloride: 104 mEq/L (ref 96–112)
Creatinine, Ser: 1.1 mg/dL (ref 0.4–1.5)
Potassium: 5.1 mEq/L (ref 3.5–5.1)

## 2010-04-09 LAB — TSH: TSH: 4.77 u[IU]/mL (ref 0.35–5.50)

## 2010-04-09 LAB — PSA: PSA: 4.24 ng/mL — ABNORMAL HIGH (ref 0.10–4.00)

## 2010-04-09 MED ORDER — NITROGLYCERIN 0.4 MG SL SUBL: 0.4000 mg | SUBLINGUAL_TABLET | SUBLINGUAL | Status: DC | PRN

## 2010-04-09 MED ORDER — SIMVASTATIN 20 MG PO TABS: 20.0000 mg | ORAL_TABLET | Freq: Every day | ORAL | Status: DC

## 2010-04-09 MED ORDER — METOPROLOL SUCCINATE ER 50 MG PO TB24: ORAL_TABLET | ORAL | Status: DC

## 2010-04-09 NOTE — Progress Notes (Signed)
  Subjective:    Patient ID: Robert Pacheco, male    DOB: 1933/07/29, 77 y.o.   MRN: [id number]  Sakuma is a 77 year old, married male, nonsmoker, who comes in today for evaluation of multiple issues.  11 years ago.  He had bypass surgery.  He has done well.  No recurrence of chest pain.  He walks.  No chest pain or shortness of breath.  Cardiac stress test last year.  Normal.  He takes a beta-blocker 25 mg b.i.d. BP 110/70.  He is lightheaded when he stands out were cut, beta-blocker, and half.  He takes Zocor 20 mg nightly for hyperlipidemia.  Will check lipid panel.  He has BPH with outlet destruction currently urinating okay.  No medication indicated.  Review of systems otherwise negative except for an excoriated lesion left temple.  Advised to go back to the dermatologist    Review of Systems  Constitutional: Negative.   HENT: Negative.   Eyes: Negative.   Respiratory: Negative.   Cardiovascular: Negative.   Gastrointestinal: Negative.   Genitourinary: Negative.   Musculoskeletal: Negative.   Skin: Negative.   Neurological: Negative.   Hematological: Negative.   Psychiatric/Behavioral: Negative.        Objective:   Physical Exam  Constitutional: He is oriented to person, place, and time. He appears well-developed and well-nourished.  HENT:  Head: Normocephalic and atraumatic.  Right Ear: External ear normal.  Left Ear: External ear normal.  Nose: Nose normal.  Mouth/Throat: Oropharynx is clear and moist.  Eyes: Conjunctivae and EOM are normal. Pupils are equal, round, and reactive to light.  Neck: Normal range of motion. Neck supple. No JVD present. No tracheal deviation present. No thyromegaly present.  Cardiovascular: Normal rate, regular rhythm, normal heart sounds and intact distal pulses.  Exam reveals no gallop and no friction rub.   No murmur heard. Pulmonary/Chest: Effort normal and breath sounds normal. No stridor. No respiratory distress. He has no  wheezes. He has no rales. He exhibits no tenderness.  Abdominal: Soft. Bowel sounds are normal. He exhibits no distension and no mass. There is no tenderness. There is no rebound and no guarding.  Genitourinary: Rectum normal and penis normal. Guaiac negative stool. No penile tenderness.       3+ symmetrical.  BPH  Musculoskeletal: Normal range of motion. He exhibits no edema and no tenderness.  Lymphadenopathy:    He has no cervical adenopathy.  Neurological: He is alert and oriented to person, place, and time. He has normal reflexes. No cranial nerve deficit. He exhibits normal muscle tone.  Skin: Skin is warm and dry. No rash noted. No erythema. No pallor.  Psychiatric: He has a normal mood and affect. His behavior is normal. Judgment and thought content normal.          Assessment & Plan:  Coronary disease, status post bypass surgery.  No chest pain.............. Continue exercise.  Hypertension continue beta blocker, but I does to one half tab daily because systolic 110.  Hyperlipidemia.  Continue Zocor 20 nightly and an aspirin tablet.  BPH with outlet obstruction.  No medication indicated at this time.  Abnormal left forehead referred to dermatology

## 2010-04-09 NOTE — Patient Instructions (Signed)
Continue your current medications follow-up in one year or sooner if any problems 

## 2010-04-10 NOTE — Progress Notes (Signed)
Quick Note:  Attempt to call - ans mach - left msg that labs were normal ______

## 2010-04-24 ENCOUNTER — Other Ambulatory Visit: Payer: Self-pay | Admitting: Dermatology

## 2010-05-20 ENCOUNTER — Other Ambulatory Visit: Payer: Self-pay | Admitting: Dermatology

## 2010-05-23 ENCOUNTER — Other Ambulatory Visit: Payer: Self-pay | Admitting: Family Medicine

## 2010-05-23 NOTE — Telephone Encounter (Signed)
Time for an office visit 

## 2010-05-27 ENCOUNTER — Ambulatory Visit (INDEPENDENT_AMBULATORY_CARE_PROVIDER_SITE_OTHER): Payer: Medicare Other | Admitting: Family Medicine

## 2010-05-27 ENCOUNTER — Ambulatory Visit (INDEPENDENT_AMBULATORY_CARE_PROVIDER_SITE_OTHER)
Admission: RE | Admit: 2010-05-27 | Discharge: 2010-05-27 | Disposition: A | Discharge status: Home / Self Care | Payer: Medicare Other | Source: Ambulatory Visit | Attending: Family Medicine | Admitting: Family Medicine

## 2010-05-27 ENCOUNTER — Ambulatory Visit
Admission: RE | Admit: 2010-05-27 | Discharge: 2010-05-27 | Disposition: A | Discharge status: Home / Self Care | Payer: Medicare Other | Source: Ambulatory Visit | Attending: Family Medicine | Admitting: Family Medicine

## 2010-05-27 ENCOUNTER — Encounter: Payer: Self-pay | Admitting: Family Medicine

## 2010-05-27 VITALS — BP 132/72 | Temp 97.9°F | Ht 67.0 in | Wt 182.0 lb

## 2010-05-27 DIAGNOSIS — M545 Low back pain, unspecified: Secondary | ICD-10-CM

## 2010-05-27 MED ORDER — HYDROCODONE-ACETAMINOPHEN 7.5-750 MG PO TABS: ORAL_TABLET | ORAL | Status: DC

## 2010-05-27 NOTE — Progress Notes (Signed)
Addended by: Kern Reap on: 05/27/2010 12:25 PM   Modules accepted: Orders

## 2010-05-27 NOTE — Patient Instructions (Signed)
Go to the main office now for your back.  X-rays  Vicodin one half tab 3 times a day as needed for severe pain.  I will call you the x-ray report and then we will go from there
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
## 2010-05-29 ENCOUNTER — Telehealth: Payer: Self-pay | Admitting: *Deleted

## 2010-05-29 NOTE — Telephone Encounter (Signed)
Calling for x-ray results.

## 2010-05-29 NOTE — Telephone Encounter (Signed)
Left message on machine.

## 2010-05-29 NOTE — Telephone Encounter (Signed)
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

## 2010-05-29 NOTE — Telephone Encounter (Signed)
Left message on machine for patient  To return our call 

## 2010-05-30 NOTE — Progress Notes (Signed)
patient  Is aware 

## 2010-06-14 NOTE — Discharge Summary (Signed)
[place]  Patient:    Robert Pacheco                         MRN: [id number] Adm. Date:  [id number] Disc. Date: 12/03/98 Attending:  Evette Georges Dictator:   Luretha Rued Newman, P.A.C. CC:         Arturo Morton. Riley Kill, M.D. [facility]             Bruce H. Swords, M.D. [facility]             Steven C. Dorris Fetch, M.D./[facility]                           Discharge Summary  DATE OF BIRTH:  03-22-2033.  DATE OF SURGERY:  11/27/98.  ADMITTING DIAGNOSES: 1. Coronary artery disease. 2. Hyperlipidemia. 3. Positive family medical history of coronary artery disease.  DISCHARGE DIAGNOSIS:  Coronary artery disease, now status post coronary artery bypass grafting x 4, with placement of a left internal mammary artery to the left anterior descending artery, a right internal mammary artery to the right coronary artery and a left radial artery to the obtuse marginal 1 and obtuse marginal 2.  HISTORY OF PRESENT ILLNESS:  [name] is a 77 year old male patient of Drs. Shawnie Pons and [employer], with known coronary artery disease, who presented to Dr. Louretta Shorten office for a treadmill after having chest pain.  Upon treadmill, he had chest pain and EKG changes.  He is now being referred for cardiac catheterization.  HOSPITAL COURSE:  [name] was admitted to the hospital on 11/23/98 after having a positive exercise treadmill with EKG changes and chest pain.  He underwent coronary catheterization on 11/23/98 by Dr. Shawnie Pons and found to have three- vessel coronary artery disease.  Dr. Charlett Lango of [facility] was consulted for possible coronary artery bypass grafting.  After discussing the possibilities, risks and benefits of surgery with [name], the decision to proceed was agreed upon.  [name] underwent coronary artery bypass grafting on 11/27/98 by Dr. Charlett Lango.  He underwent coronary artery bypass grafting times  four, with placement of a left internal mammary artery to the left anterior descending, a right internal mammary artery to the right coronary artery and a left radial artery to the obtuse marginal 1 and obtuse marginal 2 sequentially.  He tolerated this  residual well, and was transferred to the SICU in stable condition.  His initial visit in postoperative SICU revealed stable hemodynamics with a cardiac index of 4.0.  He was being diuresed and we maintained his pulmonary toilet. The following day, he was followed throughout his hospital course by cardiology. His pleural tubes were discontinued on postop day 2, and the patient was transferred to unit 2000, in stable condition.  Continuation of his hospital course:  He continued to improve daily; he began ambulating without difficulty.  His activity was increased.  He had some episodes of poor appetite; however, this has resolved.  DISPOSITION:  The patient was deemed ready for discharge home on 12/03/98.  DISCHARGE MEDICATIONS:  Imdur 30 mg p.o. q.d., Cardizem 30 mg p.o. q.6h., enteric coated aspirin 325 mg p.o. q.d., Darvocet-N 100 one to two tablets q.4-6h. p.r.n. pain.  DISCHARGE INSTRUCTIONS:  No heavy lifting.  No strenuous activity.  No driving  car.  Keep wounds clean and dry.  He may shower as desired.  He has  a follow-up  with Dr. Riley Kill in two weeks.  He is to bring a chest x-ray from Dr. Louretta Shorten office to see Dr. Dorris Fetch in three weeks.  He is to follow-up with Dr. Cato Mulligan as needed. DD:  12/03/98 TD:  12/03/98 Job: [id number]

## 2010-06-14 NOTE — Op Note (Signed)
[facility]  Patient:    Robert Pacheco, Robert Pacheco                        MRN: [id number] Proc. Date: 03/26/00 Adm. Date:  [id number] Attending:  Charlton Haws CC:         Evette Georges, M.D. [facility]   Operative Report  CCS# [phone]  PREOPERATIVE DIAGNOSIS:  Left inguinal hernia.  POSTOPERATIVE DIAGNOSIS:  Left inguinal hernia, direct.  OPERATION PERFORMED:  Repair of direct left inguinal hernia with mesh.  SURGEON:  Currie Paris, M.D.  ANESTHESIA:  MAC.  INDICATIONS FOR PROCEDURE:  The patient is a 77 year old with a left inguinal hernia that he desired to have repaired.  DESCRIPTION OF PROCEDURE:  The patient was brought to the operating room having had the operative side identified preoperatively and marked.  He was given IV sedation and the left groin area was prepped and draped.  A combination of 1% Xylocaine plus 0.5% Marcaine was mixed with epinephrine and the Xylocaine was mixed equally.  This was used for local and infiltrated along the incision line plus above the anterior superior iliac spine. Incision was made and deepened to the external oblique aponeurosis with additional local infiltrated as needed and bleeders electrocoagulated or clamped, cut and tied with 4-0 Vicryl.  The external oblique was opened in line with its fibers and the external oblique aponeurosis lifted up off the underlying tissues.  The cord was identified and the ilioinguinal nerve kept with this while it was dissected up off the floor.  The cord appeared to be intact ____________ indirect sac.  There was a large direct hernia involving the entire floor.  This was freed up off the cord inferiorly where it was stuck a little bit to the symphysis pubis and reduced.  A large mesh patch was used to plug the hole and sutured with 2-0 Prolene to hold it in place.  This produced a nice closure and nothing else was protuding.  A final check was made of the cord for  an indirect sac and again, none was found.  The patch was then overlaid and sutured with a running Prolene inferiorly and tacked medially to the internal oblique and the tails crossed to go around the cord recreating a deep ring.  The wound was checked for hemostasis and everything appeared to be dry.  The incision was closed with 3-0 Vicryl to close the external oblique over the repair, 3-0 Vicryl in the Scarpas and 4-0 Monocryl subcuticular with Steri-Strips in the skin.  The patient tolerated the procedure well.  There were no operative complications.  All counts were correct. DD:  03/26/00 TD:  03/27/00 Job: [id number]

## 2010-06-14 NOTE — Cardiovascular Report (Signed)
[place]. [facility]  Patient:    Robert Pacheco                         MRN: [id number] Proc. Date: 11/23/98 Adm. Date:  [id number] Attending:  Evette Georges CC:         Valetta Mole Swords, M.D. [facility]             Evette Georges, M.D. [facility]             Cardiac Catheterization Laboratory                        Cardiac Catheterization  INDICATIONS:  Mr. Montanye is a pleasant 77 year old white male who has had a recent history of exertional chest tightness, exercise tolerance test was positive, and he was referred for cardiac catheterization.  PROCEDURES PERFORMED:  Left heart catheterization, selective coronary angiography, selective left ventriculography, subclavian angiography.  DESCRIPTION OF PROCEDURE:  The patient was performed from the right femoral artery using 6-French catheters.  We had to upgrade to a 7-French catheter and used a JL-3.0 in order to engage the left coronary system which had separate ostia. The procedure was then completed without complication.  He was taken to the holding  area for direct manual compression.  HEMODYNAMIC DATA:  The central aortic pressure was 139/80.  LV pressure 128/9.  There was no gradient on pullback across the aortic valve.  ANGIOGRAPHIC DATA:  There are separate ostia for the left anterior descending and left circumflex arteries.  1. The left anterior descending artery has 30% to 50% segmental diffuse plaquing in    the proximal vessel.  The vessel overall appears to be somewhat diffusely    diseased.  In the mid portion, there is an 80% to 90% area of stenosis.  The    distal vessel appears to be suitable for grafting.  However, there is, as    previously noted, some diffuse irregularity.  The ostium of the LAD is difficult    to grade, but does appear to be at least 30% to 50%.  2. The circumflex is a large codominant vessel.  There is about 50% to 70%    narrowing at the ostium, although this  is somewhat difficult to grade as well.    There is a tiny first marginal branch that is insignificant.  There is a second    large marginal branch which bifurcates and has a 90% stenosis crossing the    bifurcation.  The superior aspect of the bifurcation also has 80% narrowing.  Both branches appear to be suitable for grafting.  The distal vessel courses to  the far lateral wall where it provides a posterolateral branch and a small    PDA.  3. The right coronary artery is a codominant vessel.  The right coronary artery    demonstrates an 80% proximal and an 80% stenosis in the distal portion of the    mid vessel and a 70% stenosis distally leading into the posterior descending.  4. The subclavian is widely patent and so in the internal mammary and vertebral.  LEFT VENTRICULOGRAPHY:  Ventriculography in the RAO projection reveals vigorous  global systolic function.  Ejection fraction is 78%.  No significant mitral regurgitation was noted.  CONCLUSIONS: 1. Severe three-vessel coronary artery disease. 2. Preserved left ventricular function.  RECOMMENDATIONS:  A CVTS consultation will be obtained for possible revascularization surgery.  DD:  11/23/98 TD:  11/25/98 Job: [id number]

## 2010-08-16 ENCOUNTER — Other Ambulatory Visit: Payer: Self-pay | Admitting: Family Medicine

## 2010-08-22 ENCOUNTER — Other Ambulatory Visit: Payer: Self-pay | Admitting: Family Medicine

## 2010-12-16 ENCOUNTER — Other Ambulatory Visit: Payer: Self-pay | Admitting: Family Medicine

## 2010-12-31 ENCOUNTER — Ambulatory Visit (INDEPENDENT_AMBULATORY_CARE_PROVIDER_SITE_OTHER): Payer: Medicare Other | Admitting: Family Medicine

## 2010-12-31 DIAGNOSIS — Z23 Encounter for immunization: Secondary | ICD-10-CM

## 2011-03-06 ENCOUNTER — Other Ambulatory Visit: Payer: Self-pay | Admitting: Family Medicine

## 2011-04-09 ENCOUNTER — Other Ambulatory Visit: Payer: Self-pay | Admitting: Family Medicine

## 2011-04-29 ENCOUNTER — Encounter: Payer: Medicare Other | Admitting: Family Medicine

## 2011-05-27 ENCOUNTER — Encounter: Payer: Self-pay | Admitting: Family Medicine

## 2011-05-28 ENCOUNTER — Encounter: Payer: Self-pay | Admitting: Family Medicine

## 2011-05-28 ENCOUNTER — Ambulatory Visit (INDEPENDENT_AMBULATORY_CARE_PROVIDER_SITE_OTHER): Payer: Medicare Other | Admitting: Family Medicine

## 2011-05-28 VITALS — BP 124/80 | Temp 97.4°F | Ht 67.5 in | Wt 183.0 lb

## 2011-05-28 DIAGNOSIS — E785 Hyperlipidemia, unspecified: Secondary | ICD-10-CM

## 2011-05-28 DIAGNOSIS — I10 Essential (primary) hypertension: Secondary | ICD-10-CM

## 2011-05-28 DIAGNOSIS — Z0000 Encounter for general adult medical examination without abnormal findings: Secondary | ICD-10-CM

## 2011-05-28 DIAGNOSIS — I25810 Atherosclerosis of coronary artery bypass graft(s) without angina pectoris: Secondary | ICD-10-CM

## 2011-05-28 DIAGNOSIS — B49 Unspecified mycosis: Secondary | ICD-10-CM

## 2011-05-28 DIAGNOSIS — N401 Enlarged prostate with lower urinary tract symptoms: Secondary | ICD-10-CM

## 2011-05-28 LAB — HEPATIC FUNCTION PANEL
Alkaline Phosphatase: 71 U/L (ref 39–117)
Bilirubin, Direct: 0.2 mg/dL (ref 0.0–0.3)
Total Protein: 6.7 g/dL (ref 6.0–8.3)

## 2011-05-28 LAB — LIPID PANEL
Total CHOL/HDL Ratio: 3
Triglycerides: 97 mg/dL (ref 0.0–149.0)

## 2011-05-28 LAB — BASIC METABOLIC PANEL
BUN: 17 mg/dL (ref 6–23)
CO2: 26 mEq/L (ref 19–32)
Calcium: 8.9 mg/dL (ref 8.4–10.5)
Creatinine, Ser: 1.1 mg/dL (ref 0.4–1.5)
Glucose, Bld: 78 mg/dL (ref 70–99)

## 2011-05-28 LAB — CBC WITH DIFFERENTIAL/PLATELET
Basophils Relative: 0.2 % (ref 0.0–3.0)
Eosinophils Absolute: 0 10*3/uL (ref 0.0–0.7)
Lymphs Abs: 1.6 10*3/uL (ref 0.7–4.0)
MCHC: 33.9 g/dL (ref 30.0–36.0)
MCV: 91.6 fl (ref 78.0–100.0)
Monocytes Absolute: 0.4 10*3/uL (ref 0.1–1.0)
Neutro Abs: 4.2 10*3/uL (ref 1.4–7.7)
Neutrophils Relative %: 67.9 % (ref 43.0–77.0)
RBC: 5.03 Mil/uL (ref 4.22–5.81)

## 2011-05-28 LAB — POCT URINALYSIS DIPSTICK
Protein, UA: NEGATIVE
Spec Grav, UA: 1.02
Urobilinogen, UA: 1
pH, UA: 6

## 2011-05-28 MED ORDER — NITROGLYCERIN 0.4 MG SL SUBL: 0.4000 mg | SUBLINGUAL_TABLET | SUBLINGUAL | Status: DC | PRN

## 2011-05-28 MED ORDER — METOPROLOL SUCCINATE ER 50 MG PO TB24: 50.0000 mg | ORAL_TABLET | Freq: Every day | ORAL | Status: DC

## 2011-05-28 MED ORDER — KETOCONAZOLE 2 % EX CREA: TOPICAL_CREAM | Freq: Two times a day (BID) | CUTANEOUS | Status: DC

## 2011-05-28 MED ORDER — SIMVASTATIN 20 MG PO TABS: 20.0000 mg | ORAL_TABLET | Freq: Every day | ORAL | Status: DC

## 2011-05-28 NOTE — Patient Instructions (Signed)
Continue your current medications  Followup in 1 year sooner if any problems 

## 2011-05-28 NOTE — Progress Notes (Signed)
  Subjective:    Patient ID: Robert Pacheco, male    DOB: [birth year], 78 y.o.   MRN: [id number]  HPI Robert Pacheco is a 78 year old male nonsmoker who comes in today for a Medicare wellness examination because of a history of coronary disease,,,,,,,,, bypass surgery by Dr. Robby Pacheco in 13 years ago,,,,,,, hypertension hyperlipidemia  He states he feels well no complications walks no angina. Vaccinations tetanus 2006, Pneumovax x2, sloughs seasonal flu shot 2012, information given on shingles  Cognitive function normal he walks on a regular basis home health safety reviewed no issues identified no guns in the house he does have a health care power of attorney and living well  He gets routine eye care recently told he was borderline glaucoma, regular hearing, regular dental care, colonoscopy done in GI.   Review of Systems  Constitutional: Negative.   HENT: Negative.   Eyes: Negative.   Respiratory: Negative.   Cardiovascular: Negative.   Gastrointestinal: Negative.   Genitourinary: Negative.   Musculoskeletal: Negative.   Skin: Negative.   Neurological: Negative.   Hematological: Negative.   Psychiatric/Behavioral: Negative.        Objective:   Physical Exam  Constitutional: He is oriented to person, place, and time. He appears well-developed and well-nourished.  HENT:  Head: Normocephalic and atraumatic.  Right Ear: External ear normal.  Left Ear: External ear normal.  Nose: Nose normal.  Mouth/Throat: Oropharynx is clear and moist.  Eyes: Conjunctivae and EOM are normal. Pupils are equal, round, and reactive to light.  Neck: Normal range of motion. Neck supple. No JVD present. No tracheal deviation present. No thyromegaly present.  Cardiovascular: Normal rate, regular rhythm, normal heart sounds and intact distal pulses.  Exam reveals no gallop and no friction rub.   No murmur heard. Pulmonary/Chest: Effort normal and breath sounds normal. No stridor. No respiratory distress. He has  no wheezes. He has no rales. He exhibits no tenderness.  Abdominal: Soft. Bowel sounds are normal. He exhibits no distension and no mass. There is no tenderness. There is no rebound and no guarding.  Genitourinary: Rectum normal and penis normal. Guaiac negative stool. No penile tenderness.       2+ symmetrical BPH  Musculoskeletal: Normal range of motion. He exhibits no edema and no tenderness.  Lymphadenopathy:    He has no cervical adenopathy.  Neurological: He is alert and oriented to person, place, and time. He has normal reflexes. No cranial nerve deficit. He exhibits normal muscle tone.  Skin: Skin is warm and dry. No rash noted. No erythema. No pallor.  Psychiatric: He has a normal mood and affect. His behavior is normal. Judgment and thought content normal.          Assessment & Plan:  Healthy male  Coronary disease asymptomatic  Hypertension at goal continue current medication  Hyperlipidemia check labs  BPH with outlet obstruction continue OTC Robert Pacheco

## 2012-01-14 ENCOUNTER — Ambulatory Visit (INDEPENDENT_AMBULATORY_CARE_PROVIDER_SITE_OTHER): Payer: Medicare Other

## 2012-01-14 DIAGNOSIS — Z23 Encounter for immunization: Secondary | ICD-10-CM

## 2012-05-10 ENCOUNTER — Encounter: Payer: Self-pay | Admitting: Internal Medicine

## 2012-05-10 DIAGNOSIS — Z860100 Personal history of colon polyps, unspecified: Secondary | ICD-10-CM

## 2012-05-10 HISTORY — DX: Personal history of colonic polyps: Z86.010

## 2012-05-10 HISTORY — DX: Personal history of colon polyps, unspecified: Z86.0100

## 2012-05-11 ENCOUNTER — Encounter: Payer: Self-pay | Admitting: Internal Medicine

## 2012-07-02 ENCOUNTER — Other Ambulatory Visit: Payer: Self-pay | Admitting: *Deleted

## 2012-07-02 DIAGNOSIS — I10 Essential (primary) hypertension: Secondary | ICD-10-CM

## 2012-07-02 MED ORDER — METOPROLOL SUCCINATE ER 50 MG PO TB24: 50.0000 mg | ORAL_TABLET | Freq: Every day | ORAL | Status: DC

## 2012-07-06 ENCOUNTER — Ambulatory Visit (INDEPENDENT_AMBULATORY_CARE_PROVIDER_SITE_OTHER): Payer: Medicare Other | Admitting: Family Medicine

## 2012-07-06 ENCOUNTER — Encounter: Payer: Self-pay | Admitting: Family Medicine

## 2012-07-06 VITALS — BP 140/80 | Temp 98.1°F | Ht 67.0 in | Wt 182.0 lb

## 2012-07-06 DIAGNOSIS — Z860100 Personal history of colon polyps, unspecified: Secondary | ICD-10-CM

## 2012-07-06 DIAGNOSIS — E785 Hyperlipidemia, unspecified: Secondary | ICD-10-CM

## 2012-07-06 DIAGNOSIS — N401 Enlarged prostate with lower urinary tract symptoms: Secondary | ICD-10-CM

## 2012-07-06 DIAGNOSIS — I10 Essential (primary) hypertension: Secondary | ICD-10-CM

## 2012-07-06 DIAGNOSIS — Z0000 Encounter for general adult medical examination without abnormal findings: Secondary | ICD-10-CM

## 2012-07-06 DIAGNOSIS — I25810 Atherosclerosis of coronary artery bypass graft(s) without angina pectoris: Secondary | ICD-10-CM

## 2012-07-06 DIAGNOSIS — N138 Other obstructive and reflux uropathy: Secondary | ICD-10-CM

## 2012-07-06 LAB — CBC WITH DIFFERENTIAL/PLATELET
Basophils Relative: 0.4 % (ref 0.0–3.0)
Eosinophils Absolute: 0 10*3/uL (ref 0.0–0.7)
Lymphocytes Relative: 24.8 % (ref 12.0–46.0)
MCHC: 33.3 g/dL (ref 30.0–36.0)
Monocytes Relative: 7.7 % (ref 3.0–12.0)
Neutrophils Relative %: 66.5 % (ref 43.0–77.0)
RBC: 4.87 Mil/uL (ref 4.22–5.81)
WBC: 6.3 10*3/uL (ref 4.5–10.5)

## 2012-07-06 LAB — LIPID PANEL
Cholesterol: 121 mg/dL (ref 0–200)
HDL: 30.5 mg/dL — ABNORMAL LOW (ref >39.00)
LDL Cholesterol: 67 mg/dL (ref 0–99)
Triglycerides: 116 mg/dL (ref 0.0–149.0)
VLDL: 23.2 mg/dL (ref 0.0–40.0)

## 2012-07-06 LAB — HEPATIC FUNCTION PANEL
Albumin: 3.9 g/dL (ref 3.5–5.2)
Total Protein: 7 g/dL (ref 6.0–8.3)

## 2012-07-06 LAB — BASIC METABOLIC PANEL
Calcium: 9.3 mg/dL (ref 8.4–10.5)
GFR: 76.98 mL/min (ref >60.00)
Sodium: 139 mEq/L (ref 135–145)

## 2012-07-06 LAB — POCT URINALYSIS DIPSTICK
Glucose, UA: NEGATIVE
Nitrite, UA: NEGATIVE
Spec Grav, UA: >=1.03
Urobilinogen, UA: 1

## 2012-07-06 LAB — TSH: TSH: 3.77 u[IU]/mL (ref 0.35–5.50)

## 2012-07-06 LAB — PSA: PSA: 4.3 ng/mL — ABNORMAL HIGH (ref 0.10–4.00)

## 2012-07-06 MED ORDER — SIMVASTATIN 20 MG PO TABS: 20.0000 mg | ORAL_TABLET | Freq: Every day | ORAL | Status: DC

## 2012-07-06 MED ORDER — METOPROLOL SUCCINATE ER 50 MG PO TB24: 50.0000 mg | ORAL_TABLET | Freq: Every day | ORAL | Status: DC

## 2012-07-06 MED ORDER — NITROGLYCERIN 0.4 MG SL SUBL: 0.4000 mg | SUBLINGUAL_TABLET | SUBLINGUAL | Status: DC | PRN

## 2012-07-06 NOTE — Progress Notes (Signed)
Subjective:    Patient ID: Robert Pacheco, male    DOB: 10/26/1933, 79 y.o.   MRN: [id number]  HPI Robert Pacheco is a 79 year old married male nonsmoker who comes in today for a Medicare wellness examination because of a history of underlying coronary artery disease,,,,,,,,,, bypass surgery 14 years ago,,,,,,,, hyperlipidemia on Zocor 20 mg daily. He also takes Toprol 50 mg daily BP 140 or 80 and an aspirin tablet. He has a prescription for nitroglycerin but has not had to use it. He walks about 3-4 days per week no chest pain  He states he feels well he has no complaints  He gets routine eye care, dental care, recently got a letter from GI about a followup colonoscopy. He states he's not sure if he should go back. He does have a history of colon polyps therefore I would recommend he have another colonoscopy  Cognitive function normal he walks on a regular basis home health safety reviewed no issues identified, no guns in the house, he does have a health care power of attorney and living well   Review of Systems  Constitutional: Negative.   HENT: Negative.   Eyes: Negative.   Respiratory: Negative.   Cardiovascular: Negative.   Gastrointestinal: Negative.   Genitourinary: Negative.   Musculoskeletal: Negative.   Skin: Negative.   Neurological: Negative.   Psychiatric/Behavioral: Negative.        Objective:   Physical Exam  Constitutional: He is oriented to person, place, and time. He appears well-developed and well-nourished.  HENT:  Head: Normocephalic and atraumatic.  Right Ear: External ear normal.  Left Ear: External ear normal.  Nose: Nose normal.  Mouth/Throat: Oropharynx is clear and moist.  Eyes: Conjunctivae and EOM are normal. Pupils are equal, round, and reactive to light.  Neck: Normal range of motion. Neck supple. No JVD present. No tracheal deviation present. No thyromegaly present.  Cardiovascular: Normal rate, regular rhythm, normal heart sounds and intact distal  pulses.  Exam reveals no gallop and no friction rub.   No murmur heard. Scar midline chest from previous bypass surgery 14 years ago  No carotid or aortic bruits peripheral pulses 1+ out of 2 and symmetrical  Pulmonary/Chest: Effort normal and breath sounds normal. No stridor. No respiratory distress. He has no wheezes. He has no rales. He exhibits no tenderness.  Abdominal: Soft. Bowel sounds are normal. He exhibits no distension and no mass. There is no tenderness. There is no rebound and no guarding.  Genitourinary: Rectum normal, prostate normal and penis normal. Guaiac negative stool. No penile tenderness.  Musculoskeletal: Normal range of motion. He exhibits no edema and no tenderness.  Lymphadenopathy:    He has no cervical adenopathy.  Neurological: He is alert and oriented to person, place, and time. He has normal reflexes. No cranial nerve deficit. He exhibits normal muscle tone.  Skin: Skin is warm and dry. No rash noted. No erythema. No pallor.  Total body skin exam normal. He has had a basal cell carcinoma removed from his face. He recently had a skin exam by his dermatologist. They froze off a couple lesions on the top of his head and one on his right forehead.  Psychiatric: He has a normal mood and affect. His behavior is normal. Judgment and thought content normal.  symmetrical 2+ BPH        Assessment & Plan:  Healthy male  Status post bypass surgery 14 years ago asymptomatic  Hypertension continue Toprol 50 mg daily and an aspirin  Hyperlipidemia continue Zocor 20 mg daily  BPH with mild outlet obstruction

## 2012-07-06 NOTE — Patient Instructions (Signed)
Continue your current medications  Remember to walk daily  Followup in 1 year sooner if any problems  Call GI and get set up for followup colonoscopy since you had colon polyps

## 2012-07-23 ENCOUNTER — Encounter: Payer: Self-pay | Admitting: Internal Medicine

## 2012-09-09 ENCOUNTER — Ambulatory Visit (AMBULATORY_SURGERY_CENTER): Payer: Medicare Other

## 2012-09-09 VITALS — Ht 67.5 in | Wt 180.6 lb

## 2012-09-09 DIAGNOSIS — Z860100 Personal history of colon polyps, unspecified: Secondary | ICD-10-CM

## 2012-09-09 MED ORDER — SUPREP BOWEL PREP KIT 17.5-3.13-1.6 GM/177ML PO SOLN: 1.0000 | Freq: Once | ORAL | Status: DC

## 2012-09-23 ENCOUNTER — Ambulatory Visit (AMBULATORY_SURGERY_CENTER): Payer: Medicare Other | Admitting: Internal Medicine

## 2012-09-23 ENCOUNTER — Encounter: Payer: Self-pay | Admitting: Internal Medicine

## 2012-09-23 VITALS — BP 125/76 | HR 67 | Temp 96.8°F | Resp 22 | Ht 67.2 in | Wt 180.0 lb

## 2012-09-23 DIAGNOSIS — Z86010 Personal history of colonic polyps: Secondary | ICD-10-CM

## 2012-09-23 DIAGNOSIS — D126 Benign neoplasm of colon, unspecified: Secondary | ICD-10-CM

## 2012-09-23 MED ORDER — SODIUM CHLORIDE 0.9 % IV SOLN: 500.0000 mL | INTRAVENOUS | Status: DC

## 2012-09-23 MED ORDER — ASPIRIN 81 MG PO TABS: 81.0000 mg | ORAL_TABLET | Freq: Every day | ORAL | Status: DC

## 2012-09-23 NOTE — Op Note (Addendum)
[facility] [address] [place], [address]   COLONOSCOPY PROCEDURE REPORT  PATIENT: Robert Pacheco, Robert Pacheco  MR#: [id number] BIRTHDATE: 10/04/1933 , 79  yrs. old GENDER: Male ENDOSCOPIST: Iva Boop, MD, [facility] REFERRED BY: PROCEDURE DATE:  09/23/2012 PROCEDURE:   Colonoscopy with biopsy and snare polypectomy First Screening Colonoscopy - Avg.  risk and is 50 yrs.  old or older - No.  Prior Negative Screening - Now for repeat screening. N/A  History of Adenoma - Now for follow-up colonoscopy & has been > or = to 3 yrs.  Yes hx of adenoma.  Has been 3 or more years since last colonoscopy.  Polyps Removed Today? Yes. ASA CLASS:   Class II INDICATIONS:Patient's personal history of adenomatous colon polyps.  MEDICATIONS: Propofol (Diprivan) 330 mg IV, MAC sedation, administered by CRNA, and These medications were titrated to patient response per physician's verbal order  DESCRIPTION OF PROCEDURE:   After the risks benefits and alternatives of the procedure were thoroughly explained, informed consent was obtained.  A digital rectal exam revealed an enlarged prostate, moderate, that was smooth.   The [id number] endoscope was introduced through the anus and advanced to the cecum, which was identified by both the appendix and ileocecal valve. No adverse events experienced.   The quality of the prep was Suprep good  The instrument was then slowly withdrawn as the colon was fully examined.      COLON FINDINGS: Six sessile polyps measuring 3-15 mm in size were found in the ascending colon (15 mm hot snare and 3 mm cold biopsy removal both recovered), transverse colon (5 mm cold snare not recovered) , at the splenic flexure (1 cm and 8 mm hot snare - 8 mm not recovered), and in the descending colon (12 mm hot snare recovered).  A polypectomy was performed with cold forceps, with a cold snare and using snare cautery.  The resection was complete and the polyp  tissue was partially retrieved.   The colon mucosa was otherwise normal.  Retroflexed views revealed no abnormalities. The time to cecum=9 minutes 26 seconds.  Withdrawal time=24 minutes 23 seconds.  The scope was withdrawn and the procedure completed. COMPLICATIONS: There were no complications.  ENDOSCOPIC IMPRESSION: 1.   Six sessile polyps measuring 3-15 mm in size were found in the ascending colon, transverse colon, at the splenic flexure, and in the descending colon; polypectomy was performed with cold forceps, with a cold snare and using snare cautery 2.   The colon mucosa was otherwise normal - good prep - hx of adenomas in this patient - brother recently dx CRCA  RECOMMENDATIONS: 1.  Hold aspirin, aspirin products, and anti-inflammatory medication for 2 weeks. restart 10/08/2012 2.  Timing of repeat colonoscopy will be determined by pathology findings. 3.   Even though he is elderly, given #'s of polyps in past 2 colonoscopies would consider a repeat colonoscopy one more time if fit.   eSigned:  Iva Boop, MD, [facility] 09/23/2012 11:28 AM Revised: 09/23/2012 11:28 AM  cc: The Patient   PATIENT NAME:  [name], [name] MR#: [id number]

## 2012-09-23 NOTE — Progress Notes (Signed)
Called to room to assist during endoscopic procedure.  Patient ID and intended procedure confirmed with present staff. Received instructions for my participation in the procedure from the performing physician.  

## 2012-09-23 NOTE — Patient Instructions (Addendum)
I found and removed 6 polyps. I was unable to collect 2 of them but they all looked benign.   I will let you know pathology results and when to have another routine colonoscopy by mail. Given today's findings you should consider having another colonoscopy in 3 years, if fit.  I appreciate the opportunity to care for you. Iva Boop, MD, FACG   YOU HAD AN ENDOSCOPIC PROCEDURE TODAY AT THE [facility]: Refer to the procedure report that was given to you for any specific questions about what was found during the examination.  If the procedure report does not answer your questions, please call your gastroenterologist to clarify.  If you requested that your care partner not be given the details of your procedure findings, then the procedure report has been included in a sealed envelope for you to review at your convenience later.  YOU SHOULD EXPECT: Some feelings of bloating in the abdomen. Passage of more gas than usual.  Walking can help get rid of the air that was put into your GI tract during the procedure and reduce the bloating. If you had a lower endoscopy (such as a colonoscopy or flexible sigmoidoscopy) you may notice spotting of blood in your stool or on the toilet paper. If you underwent a bowel prep for your procedure, then you may not have a normal bowel movement for a few days.  DIET: Your first meal following the procedure should be a light meal and then it is ok to progress to your normal diet.  A half-sandwich or bowl of soup is an example of a good first meal.  Heavy or fried foods are harder to digest and may make you feel nauseous or bloated.  Likewise meals heavy in dairy and vegetables can cause extra gas to form and this can also increase the bloating.  Drink plenty of fluids but you should avoid alcoholic beverages for 24 hours.  ACTIVITY: Your care partner should take you home directly after the procedure.  You should plan to take it easy, moving slowly for the  rest of the day.  You can resume normal activity the day after the procedure however you should NOT DRIVE or use heavy machinery for 24 hours (because of the sedation medicines used during the test).    SYMPTOMS TO REPORT IMMEDIATELY: A gastroenterologist can be reached at any hour.  During normal business hours, 8:30 AM to 5:00 PM Monday through Friday, call [phone].  After hours and on weekends, please call the GI answering service at [phone] who will take a message and have the physician on call contact you.   Following lower endoscopy (colonoscopy or flexible sigmoidoscopy):  Excessive amounts of blood in the stool  Significant tenderness or worsening of abdominal pains  Swelling of the abdomen that is new, acute  Fever of 100F or higher  FOLLOW UP: If any biopsies were taken you will be contacted by phone or by letter within the next 1-3 weeks.  Call your gastroenterologist if you have not heard about the biopsies in 3 weeks.  Our staff will call the home number listed on your records the next business day following your procedure to check on you and address any questions or concerns that you may have at that time regarding the information given to you following your procedure. This is a courtesy call and so if there is no answer at the home number and we have not heard from you through the emergency  physician on call, we will assume that you have returned to your regular daily activities without incident.  SIGNATURES/CONFIDENTIALITY: You and/or your care partner have signed paperwork which will be entered into your electronic medical record.  These signatures attest to the fact that that the information above on your After Visit Summary has been reviewed and is understood.  Full responsibility of the confidentiality of this discharge information lies with you and/or your care-partner.   HOLD ASPIRIN, IBUPROFEN, ALEVE, AND ANY OTHER NSAIDS FOR 2 WEEKS.   YOU MAY TAKE TYLENOL  FOR PAIN RELIEF.

## 2012-09-23 NOTE — Progress Notes (Signed)
Patient did not have preoperative order for IV antibiotic SSI prophylaxis. (G8918)  Patient did not experience any of the following events: a burn prior to discharge; a fall within the facility; wrong site/side/patient/procedure/implant event; or a hospital transfer or hospital admission upon discharge from the facility. (G8907)  

## 2012-09-23 NOTE — Progress Notes (Signed)
Procedure ends, to recovery, report given and VSS. 

## 2012-09-24 ENCOUNTER — Telehealth: Payer: Self-pay

## 2012-09-24 NOTE — Telephone Encounter (Signed)
  Follow up Call-  Call back number 09/23/2012  Post procedure Call Back phone  # [phone]  Permission to leave phone message Yes     Patient questions:  Do you have a fever, pain , or abdominal swelling? no Pain Score  0   Have you tolerated food without any problems? yes  Have you been able to return to your normal activities? yes  Do you have any questions about your discharge instructions: Diet   no Medications  no Follow up visit  no  Do you have questions or concerns about your Care? no  Actions: * If pain score is 4 or above: No action needed, pain <4.

## 2012-10-01 ENCOUNTER — Encounter: Payer: Self-pay | Admitting: Internal Medicine

## 2012-10-01 DIAGNOSIS — Z800 Family history of malignant neoplasm of digestive organs: Secondary | ICD-10-CM | POA: Insufficient documentation

## 2012-10-01 NOTE — Progress Notes (Signed)
Quick Note:  6 adenomas and TV adenomas max 15 mm Repeat colon 2017 ______

## 2012-12-15 ENCOUNTER — Ambulatory Visit (INDEPENDENT_AMBULATORY_CARE_PROVIDER_SITE_OTHER): Payer: Medicare Other

## 2012-12-15 ENCOUNTER — Telehealth: Payer: Self-pay | Admitting: Family Medicine

## 2012-12-15 DIAGNOSIS — Z23 Encounter for immunization: Secondary | ICD-10-CM

## 2012-12-15 NOTE — Telephone Encounter (Signed)
Patient was concerned about the medication he is currently taking niacin 500 MG tablet. He stated he noticed an announcement on tv and it is causing several other things and he was just wondering if he still needs to take it.

## 2012-12-15 NOTE — Telephone Encounter (Signed)
Patient has a  Question about niacin 500 MG tablet Sig - Route: Take 500 mg by mouth daily with breakfast. - Oral He has been taking it and there has been an announcement on TV about it and he is concerned. It was a study and can cause several things and he was just wondering.

## 2012-12-16 NOTE — Telephone Encounter (Signed)
Robert Pacheco please call okay to stop the niacin

## 2012-12-17 NOTE — Telephone Encounter (Signed)
Spoke with patient.

## 2013-07-09 ENCOUNTER — Other Ambulatory Visit: Payer: Self-pay | Admitting: Family Medicine

## 2013-07-28 ENCOUNTER — Encounter: Payer: Self-pay | Admitting: Family Medicine

## 2013-07-28 ENCOUNTER — Ambulatory Visit (INDEPENDENT_AMBULATORY_CARE_PROVIDER_SITE_OTHER): Payer: Medicare Other | Admitting: Family Medicine

## 2013-07-28 VITALS — BP 142/80 | Temp 98.4°F | Ht 67.0 in | Wt 180.0 lb

## 2013-07-28 DIAGNOSIS — Z86010 Personal history of colonic polyps: Secondary | ICD-10-CM

## 2013-07-28 DIAGNOSIS — I10 Essential (primary) hypertension: Secondary | ICD-10-CM

## 2013-07-28 DIAGNOSIS — I209 Angina pectoris, unspecified: Secondary | ICD-10-CM

## 2013-07-28 DIAGNOSIS — N401 Enlarged prostate with lower urinary tract symptoms: Secondary | ICD-10-CM

## 2013-07-28 DIAGNOSIS — Z23 Encounter for immunization: Secondary | ICD-10-CM

## 2013-07-28 DIAGNOSIS — I25701 Atherosclerosis of coronary artery bypass graft(s), unspecified, with angina pectoris with documented spasm: Secondary | ICD-10-CM

## 2013-07-28 DIAGNOSIS — I25810 Atherosclerosis of coronary artery bypass graft(s) without angina pectoris: Secondary | ICD-10-CM

## 2013-07-28 DIAGNOSIS — E785 Hyperlipidemia, unspecified: Secondary | ICD-10-CM

## 2013-07-28 LAB — LIPID PANEL
CHOL/HDL RATIO: 3
Cholesterol: 127 mg/dL (ref 0–200)
HDL: 36.9 mg/dL — ABNORMAL LOW (ref >39.00)
LDL Cholesterol: 67 mg/dL (ref 0–99)
NONHDL: 90.1
Triglycerides: 117 mg/dL (ref 0.0–149.0)
VLDL: 23.4 mg/dL (ref 0.0–40.0)

## 2013-07-28 LAB — HEPATIC FUNCTION PANEL
ALK PHOS: 76 U/L (ref 39–117)
ALT: 17 U/L (ref 0–53)
AST: 21 U/L (ref 0–37)
Albumin: 4 g/dL (ref 3.5–5.2)
BILIRUBIN DIRECT: 0.2 mg/dL (ref 0.0–0.3)
Total Bilirubin: 1.1 mg/dL (ref 0.2–1.2)
Total Protein: 6.8 g/dL (ref 6.0–8.3)

## 2013-07-28 LAB — BASIC METABOLIC PANEL
BUN: 15 mg/dL (ref 6–23)
CALCIUM: 9.3 mg/dL (ref 8.4–10.5)
CHLORIDE: 105 meq/L (ref 96–112)
CO2: 28 meq/L (ref 19–32)
Creatinine, Ser: 0.9 mg/dL (ref 0.4–1.5)
GFR: 83.47 mL/min (ref >60.00)
GLUCOSE: 97 mg/dL (ref 70–99)
Potassium: 4.6 mEq/L (ref 3.5–5.1)
SODIUM: 140 meq/L (ref 135–145)

## 2013-07-28 LAB — POCT URINALYSIS DIPSTICK
Bilirubin, UA: NEGATIVE
GLUCOSE UA: NEGATIVE
Ketones, UA: NEGATIVE
Leukocytes, UA: NEGATIVE
Nitrite, UA: NEGATIVE
PROTEIN UA: NEGATIVE
SPEC GRAV UA: 1.025
UROBILINOGEN UA: 0.2
pH, UA: 5.5

## 2013-07-28 LAB — TSH: TSH: 4.8 u[IU]/mL — ABNORMAL HIGH (ref 0.35–4.50)

## 2013-07-28 LAB — CBC WITH DIFFERENTIAL/PLATELET
BASOS PCT: 0.3 % (ref 0.0–3.0)
Basophils Absolute: 0 10*3/uL (ref 0.0–0.1)
EOS ABS: 0 10*3/uL (ref 0.0–0.7)
Eosinophils Relative: 0.2 % (ref 0.0–5.0)
HCT: 46.5 % (ref 39.0–52.0)
HEMOGLOBIN: 15.9 g/dL (ref 13.0–17.0)
Lymphocytes Relative: 21.2 % (ref 12.0–46.0)
Lymphs Abs: 1.5 10*3/uL (ref 0.7–4.0)
MCHC: 34.2 g/dL (ref 30.0–36.0)
MCV: 92.1 fl (ref 78.0–100.0)
MONO ABS: 0.4 10*3/uL (ref 0.1–1.0)
Monocytes Relative: 6.1 % (ref 3.0–12.0)
NEUTROS ABS: 5.1 10*3/uL (ref 1.4–7.7)
Neutrophils Relative %: 72.2 % (ref 43.0–77.0)
Platelets: 221 10*3/uL (ref 150.0–400.0)
RBC: 5.05 Mil/uL (ref 4.22–5.81)
RDW: 13.5 % (ref 11.5–15.5)
WBC: 7.1 10*3/uL (ref 4.0–10.5)

## 2013-07-28 LAB — PSA: PSA: 4.63 ng/mL — ABNORMAL HIGH (ref 0.10–4.00)

## 2013-07-28 MED ORDER — FINASTERIDE 5 MG PO TABS
5.0000 mg | ORAL_TABLET | Freq: Every day | ORAL | Status: DC
Start: 2013-07-28 — End: 2014-02-16

## 2013-07-28 MED ORDER — NITROGLYCERIN 0.4 MG SL SUBL: 0.4000 mg | SUBLINGUAL_TABLET | SUBLINGUAL | Status: DC | PRN

## 2013-07-28 MED ORDER — METOPROLOL SUCCINATE ER 50 MG PO TB24: 50.0000 mg | ORAL_TABLET | Freq: Every day | ORAL | Status: DC

## 2013-07-28 MED ORDER — SIMVASTATIN 20 MG PO TABS: 20.0000 mg | ORAL_TABLET | Freq: Every day | ORAL | Status: DC

## 2013-07-28 NOTE — Patient Instructions (Signed)
Continue current medications except stop the saw palmetto  Proscar 5 mg one tablet Monday Wednesday Friday  Return in 3 months for followup

## 2013-07-28 NOTE — Progress Notes (Signed)
Pre visit review using our clinic review tool, if applicable. No additional management support is needed unless otherwise documented below in the visit note. 

## 2013-07-28 NOTE — Progress Notes (Signed)
   Subjective:    Patient ID: Robert Pacheco, male    DOB: [birth year], 80 y.o.   MRN: [id number]  HPI [name] is a 80 year old married male nonsmoker who comes in today for evaluation of hypertension, coronary disease status post bypass surgery, hyperlipidemia, BPH with worsening outlet obstruction and nocturia now x4  He stood shingles vaccine  He gets routine eye care, dental care, colonoscopy last year showed 6 polyps. He's to go back in 3 years. He was given a Prevnar 13 today  Cognitive function normal he walks daily home health safety reviewed no issues identified, no guns in the house, he does have a health care power of attorney and living well.   Review of Systems  Constitutional: Negative.   HENT: Negative.   Eyes: Negative.   Respiratory: Negative.   Cardiovascular: Negative.   Gastrointestinal: Negative.   Genitourinary: Negative.   Musculoskeletal: Negative.   Skin: Negative.   Neurological: Negative.   Psychiatric/Behavioral: Negative.        Objective:   Physical Exam  Nursing note and vitals reviewed. Constitutional: He is oriented to person, place, and time. He appears well-developed and well-nourished.  HENT:  Head: Normocephalic and atraumatic.  Right Ear: External ear normal.  Left Ear: External ear normal.  Nose: Nose normal.  Mouth/Throat: Oropharynx is clear and moist.  Eyes: Conjunctivae and EOM are normal. Pupils are equal, round, and reactive to light.  Neck: Normal range of motion. Neck supple. No JVD present. No tracheal deviation present. No thyromegaly present.  Cardiovascular: Normal rate, regular rhythm, normal heart sounds and intact distal pulses.  Exam reveals no gallop and no friction rub.   No murmur heard. Bruits peripheral pulses 2+ and symmetrical  ...............[name] midline scar from bypass surgery 15 years ago cardiac-wise asymptomatic  Pulmonary/Chest: Effort normal and breath sounds normal. No stridor. No respiratory distress. He has  no wheezes. He has no rales. He exhibits no tenderness.  Abdominal: Soft. Bowel sounds are normal. He exhibits no distension and no mass. There is no tenderness. There is no rebound and no guarding.  Genitourinary: Rectum normal and penis normal. Guaiac negative stool. No penile tenderness.  3+ symmetrical BPH  Musculoskeletal: Normal range of motion. He exhibits no edema and no tenderness.  Lymphadenopathy:    He has no cervical adenopathy.  Neurological: He is alert and oriented to person, place, and time. He has normal reflexes. He displays normal reflexes. No cranial nerve deficit. He exhibits normal muscle tone. Coordination normal.  Skin: Skin is warm and dry. No rash noted. No erythema. No pallor.  Psychiatric: He has a normal mood and affect. His behavior is normal. Judgment and thought content normal.          Assessment & Plan:  Healthy male  Coronary disease bypass surgery 15 years ago......... asymptomatic  Hyperlipidemia continue aspirin and Zocor  Hypertension continue Toprol 50 mg daily  Worsening BPH now with nocturia x3,,,,,,, stop saw palmetto,,,,,,, begin Proscar 5 mg,

## 2013-08-01 ENCOUNTER — Telehealth: Payer: Self-pay | Admitting: Family Medicine

## 2013-08-01 NOTE — Telephone Encounter (Signed)
Relevant patient education mailed to patient.  

## 2013-11-03 ENCOUNTER — Ambulatory Visit: Payer: Medicare Other | Admitting: Family Medicine

## 2013-12-13 ENCOUNTER — Ambulatory Visit: Payer: Medicare Other

## 2013-12-13 ENCOUNTER — Ambulatory Visit (INDEPENDENT_AMBULATORY_CARE_PROVIDER_SITE_OTHER): Payer: Medicare Other

## 2013-12-13 DIAGNOSIS — Z23 Encounter for immunization: Secondary | ICD-10-CM

## 2014-01-10 ENCOUNTER — Other Ambulatory Visit: Payer: Self-pay | Admitting: Family Medicine

## 2014-02-16 ENCOUNTER — Telehealth: Payer: Self-pay | Admitting: Family Medicine

## 2014-02-16 DIAGNOSIS — N400 Enlarged prostate without lower urinary tract symptoms: Secondary | ICD-10-CM

## 2014-02-16 MED ORDER — FINASTERIDE 5 MG PO TABS: 5.0000 mg | ORAL_TABLET | Freq: Every day | ORAL | Status: DC

## 2014-02-16 NOTE — Telephone Encounter (Signed)
Pt request refill of the following: finasteride (PROSCAR) 5 MG tablet   Phamacy: [facility] [place]

## 2014-03-09 DIAGNOSIS — H52229 Regular astigmatism, unspecified eye: Secondary | ICD-10-CM | POA: Insufficient documentation

## 2014-03-09 DIAGNOSIS — H25812 Combined forms of age-related cataract, left eye: Secondary | ICD-10-CM | POA: Insufficient documentation

## 2014-06-06 ENCOUNTER — Ambulatory Visit (INDEPENDENT_AMBULATORY_CARE_PROVIDER_SITE_OTHER): Payer: Medicare Other | Admitting: Podiatry

## 2014-06-06 ENCOUNTER — Ambulatory Visit (INDEPENDENT_AMBULATORY_CARE_PROVIDER_SITE_OTHER): Payer: Medicare Other

## 2014-06-06 ENCOUNTER — Encounter: Payer: Self-pay | Admitting: Podiatry

## 2014-06-06 VITALS — BP 153/84 | HR 59 | Resp 18

## 2014-06-06 DIAGNOSIS — M79672 Pain in left foot: Secondary | ICD-10-CM

## 2014-06-06 DIAGNOSIS — M722 Plantar fascial fibromatosis: Secondary | ICD-10-CM

## 2014-06-06 MED ORDER — TRIAMCINOLONE ACETONIDE 10 MG/ML IJ SUSP
10.0000 mg | Freq: Once | INTRAMUSCULAR | Status: AC
  Administered 2014-06-06: 10 mg

## 2014-06-06 NOTE — Progress Notes (Signed)
   Subjective:    Patient ID: Robert Pacheco, male    DOB: [birth year], 81 y.o.   MRN: [id number]  HPI I HAVE SOME LEFT HEEL PAIN ON THE BOTTOM AND HAS BEEN HURTING FOR ABOUT  A MONTH AND HURTS IN THE AM AND WALKS ON THE BALL OF MY LEFT FOOT AND IS SORE    Review of Systems  Endocrine:       Increase urination  Genitourinary: Positive for urgency.  Hematological: Bruises/bleeds easily.  All other systems reviewed and are negative.      Objective:   Physical Exam        Assessment & Plan:

## 2014-06-06 NOTE — Patient Instructions (Signed)

## 2014-06-06 NOTE — Progress Notes (Signed)
Subjective:     Patient ID: Robert Pacheco, male   DOB: Aug 23, 1933, 81 y.o.   MRN: [id number]  HPI patient presents stating I have a lot of pain in my left heel that's been present for several months and worse over the last month. Worse when I get up in the morning and after periods of sitting   Review of Systems  All other systems reviewed and are negative.      Objective:   Physical Exam  Constitutional: He is oriented to person, place, and time.  Cardiovascular: Intact distal pulses.   Musculoskeletal: Normal range of motion.  Neurological: He is oriented to person, place, and time.  Skin: Skin is warm.  Nursing note and vitals reviewed.  neurovascular status intact muscle strength adequate range of motion within normal limits. Patient's noted to have exquisite discomfort plantar aspect left heel at the insertional point the tendon the calcaneus with inflammation and fluid around the medial band patient has good digital perfusion and is well oriented 3     Assessment:     Plantar fasciitis left acute in nature medial band left heel    Plan:     H&P and x-rays reviewed. Today I injected the left plantar fascia 3 mg Kenalog 5 mg Xylocaine and applied fascial brace with instructions on usage. Instructed on physical therapy and reappoint to recheck in one week

## 2014-06-14 ENCOUNTER — Encounter: Payer: Self-pay | Admitting: Podiatry

## 2014-06-14 ENCOUNTER — Ambulatory Visit (INDEPENDENT_AMBULATORY_CARE_PROVIDER_SITE_OTHER): Payer: Medicare Other | Admitting: Podiatry

## 2014-06-14 VITALS — BP 164/84 | HR 56 | Resp 15

## 2014-06-14 DIAGNOSIS — M722 Plantar fascial fibromatosis: Secondary | ICD-10-CM

## 2014-06-14 MED ORDER — TRIAMCINOLONE ACETONIDE 10 MG/ML IJ SUSP
10.0000 mg | Freq: Once | INTRAMUSCULAR | Status: AC
  Administered 2014-06-14: 10 mg

## 2014-06-14 NOTE — Progress Notes (Signed)
Subjective:     Patient ID: Robert Pacheco, male   DOB: 09/01/1933, 81 y.o.   MRN: [id number]  HPI patient presents stating my heel is better but it still hurts   Review of Systems     Objective:   Physical Exam Neurovascular status intact muscle strength adequate range of motion within normal limits with discomfort in the plantar heel left still noted    Assessment:     Plantar fasciitis left with inflammation and fluid    Plan:     Injected the left plantar fascia 3 mg Kenalog 5 mg Xylocaine and patient will be seen back as needed

## 2014-09-11 ENCOUNTER — Encounter: Payer: Self-pay | Admitting: Physician Assistant

## 2014-09-11 ENCOUNTER — Ambulatory Visit (INDEPENDENT_AMBULATORY_CARE_PROVIDER_SITE_OTHER): Payer: Medicare Other | Admitting: Physician Assistant

## 2014-09-11 VITALS — BP 134/62 | HR 56 | Temp 97.5°F | Ht 67.0 in | Wt 185.2 lb

## 2014-09-11 DIAGNOSIS — I10 Essential (primary) hypertension: Secondary | ICD-10-CM | POA: Diagnosis not present

## 2014-09-11 DIAGNOSIS — Z23 Encounter for immunization: Secondary | ICD-10-CM

## 2014-09-11 DIAGNOSIS — N400 Enlarged prostate without lower urinary tract symptoms: Secondary | ICD-10-CM

## 2014-09-11 DIAGNOSIS — E785 Hyperlipidemia, unspecified: Secondary | ICD-10-CM | POA: Diagnosis not present

## 2014-09-11 DIAGNOSIS — Z136 Encounter for screening for cardiovascular disorders: Secondary | ICD-10-CM

## 2014-09-11 DIAGNOSIS — Z0000 Encounter for general adult medical examination without abnormal findings: Secondary | ICD-10-CM | POA: Diagnosis not present

## 2014-09-11 LAB — CBC
HCT: 46.9 % (ref 39.0–52.0)
Hemoglobin: 15.9 g/dL (ref 13.0–17.0)
MCHC: 33.8 g/dL (ref 30.0–36.0)
MCV: 92.2 fl (ref 78.0–100.0)
Platelets: 257 10*3/uL (ref 150.0–400.0)
RBC: 5.09 Mil/uL (ref 4.22–5.81)
RDW: 13.2 % (ref 11.5–15.5)
WBC: 7.3 10*3/uL (ref 4.0–10.5)

## 2014-09-11 LAB — URINALYSIS, ROUTINE W REFLEX MICROSCOPIC
Bilirubin Urine: NEGATIVE
HGB URINE DIPSTICK: NEGATIVE
Ketones, ur: NEGATIVE
Leukocytes, UA: NEGATIVE
Nitrite: NEGATIVE
SPECIFIC GRAVITY, URINE: 1.02 (ref 1.000–1.030)
Total Protein, Urine: NEGATIVE
UROBILINOGEN UA: 0.2 (ref 0.0–1.0)
Urine Glucose: NEGATIVE
pH: 5.5 (ref 5.0–8.0)

## 2014-09-11 LAB — LDL CHOLESTEROL, DIRECT: LDL DIRECT: 135 mg/dL

## 2014-09-11 LAB — COMPREHENSIVE METABOLIC PANEL
ALT: 12 U/L (ref 0–53)
AST: 18 U/L (ref 0–37)
Albumin: 4.3 g/dL (ref 3.5–5.2)
Alkaline Phosphatase: 83 U/L (ref 39–117)
BILIRUBIN TOTAL: 0.7 mg/dL (ref 0.2–1.2)
BUN: 15 mg/dL (ref 6–23)
CALCIUM: 9.5 mg/dL (ref 8.4–10.5)
CHLORIDE: 105 meq/L (ref 96–112)
CO2: 29 meq/L (ref 19–32)
CREATININE: 0.98 mg/dL (ref 0.40–1.50)
GFR: 78.35 mL/min (ref >60.00)
GLUCOSE: 101 mg/dL — AB (ref 70–99)
Potassium: 4.8 mEq/L (ref 3.5–5.1)
SODIUM: 140 meq/L (ref 135–145)
Total Protein: 7.1 g/dL (ref 6.0–8.3)

## 2014-09-11 LAB — LIPID PANEL
Cholesterol: 212 mg/dL — ABNORMAL HIGH (ref 0–200)
HDL: 32.7 mg/dL — AB (ref >39.00)
NonHDL: 178.9
Total CHOL/HDL Ratio: 6
Triglycerides: 213 mg/dL — ABNORMAL HIGH (ref 0.0–149.0)
VLDL: 42.6 mg/dL — AB (ref 0.0–40.0)

## 2014-09-11 LAB — PSA: PSA: 2.81 ng/mL (ref 0.10–4.00)

## 2014-09-11 NOTE — Progress Notes (Signed)
Pre visit review using our clinic review tool, if applicable. No additional management support is needed unless otherwise documented below in the visit note. 

## 2014-09-11 NOTE — Progress Notes (Signed)
Subjective:    Robert Pacheco is a 81 y.o. male who presents for Medicare Annual/Subsequent preventive examination.   Preventive Screening-Counseling & Management  Tobacco History  Smoking status  . Former Smoker  Smokeless tobacco  . Never Used    Problems Prior to Visit 1. Hypertension with Hx of CAD -- Currently on Toprol XL 50 mg daily. Patient denies chest pain, palpitations, lightheadedness, dizziness, vision changes or frequent headaches. Is taking ASA 81 mg daily.   BP Readings from Last 3 Encounters:  09/11/14 134/62  06/14/14 164/84  06/06/14 153/84   2. Hyperlipidemia -- Previously on Simvastatin 20 mg daily. Has not taken medication in 1 year. Is fasting for repeat lipid panel  3. BPH -- Currently on Finasteride with good relief of urinary symptoms.  4. Constipation -- Endorses BM every 2-3 days. Has tried to increase fluids and start a fiber supplement.  Current Problems (verified) Patient Active Problem List   Diagnosis Date Noted  . Family history of colon cancer 10/01/2012  . Personal history of colonic adenomas 05/10/2012  . Lower back pain 05/27/2010  . BENIGN PROSTATIC HYPERTROPHY, WITH OBSTRUCTION 03/27/2009  . DYSPLASTIC NEVUS, [place] 10/05/2008  . CORONARY ATHEROSCLEROSIS OF ARTERY BYPASS GRAFT 03/02/2007  . HYPERLIPIDEMIA 02/22/2007  . HYPERTENSION 02/22/2007    Medications Prior to Visit Current Outpatient Prescriptions on File Prior to Visit  Medication Sig Dispense Refill  . aspirin 81 MG chewable tablet Chew 81 mg by mouth.    . finasteride (PROSCAR) 5 MG tablet Take 1 tablet (5 mg total) by mouth daily. 100 tablet 1  . fish oil-omega-3 fatty acids 1000 MG capsule Take 2 g by mouth daily.      [name] ketoconazole (NIZORAL) 2 % cream APPLY CREAM TOPICALLY TWICE DAILY 60 g 1  . metoprolol succinate (TOPROL-XL) 50 MG 24 hr tablet TAKE ONE TABLET BY MOUTH ONCE DAILY WITH OR IMMEDIATELY FOLLOWING A MEAL 90 tablet 0  . nitroGLYCERIN (NITROSTAT) 0.4 MG  SL tablet Place 1 tablet (0.4 mg total) under the tongue every 5 (five) minutes as needed. 25 tablet 1  . Saw Palmetto 450 MG CAPS Take 1 tablet by mouth.    . simvastatin (ZOCOR) 20 MG tablet Take 1 tablet (20 mg total) by mouth at bedtime. 100 tablet 3   No current facility-administered medications on file prior to visit.    Current Medications (verified) Current Outpatient Prescriptions  Medication Sig Dispense Refill  . aspirin 81 MG chewable tablet Chew 81 mg by mouth.    . finasteride (PROSCAR) 5 MG tablet Take 1 tablet (5 mg total) by mouth daily. 100 tablet 1  . fish oil-omega-3 fatty acids 1000 MG capsule Take 2 g by mouth daily.      [name] ketoconazole (NIZORAL) 2 % cream APPLY CREAM TOPICALLY TWICE DAILY 60 g 1  . metoprolol succinate (TOPROL-XL) 50 MG 24 hr tablet TAKE ONE TABLET BY MOUTH ONCE DAILY WITH OR IMMEDIATELY FOLLOWING A MEAL 90 tablet 0  . nitroGLYCERIN (NITROSTAT) 0.4 MG SL tablet Place 1 tablet (0.4 mg total) under the tongue every 5 (five) minutes as needed. 25 tablet 1  . Saw Palmetto 450 MG CAPS Take 1 tablet by mouth.    . simvastatin (ZOCOR) 20 MG tablet Take 1 tablet (20 mg total) by mouth at bedtime. 100 tablet 3   No current facility-administered medications for this visit.     Allergies (verified) Other   PAST HISTORY  Family History Family History  Problem Relation  Age of Onset  . Heart disease Father   . Colon cancer Brother     2 brothers  . Healthy Child     x 4  . Healthy Grandchild     x 11    Social History Social History  Substance Use Topics  . Smoking status: Former Research scientist (life sciences)  . Smokeless tobacco: Never Used  . Alcohol Use: No   Are there smokers in your home (other than you)?  No  Risk Factors Current exercise habits: Home exercise routine includes walking 1 hrs per day.  Dietary issues discussed: Body mass index is 29 kg/(m^2). Endorses well-balanced diet. Drinks a lot of water but sometimes has iced tea and milk.   Cardiac  risk factors: advanced age (older than 104 for men, 51 for women), dyslipidemia, family history of premature cardiovascular disease, hypertension, male gender and sedentary lifestyle.  Depression Screen (Note: if answer to either of the following is "Yes", a more complete depression screening is indicated)   Q1: Over the past two weeks, have you felt down, depressed or hopeless? No  Q2: Over the past two weeks, have you felt little interest or pleasure in doing things? No  Have you lost interest or pleasure in daily life? No  Do you often feel hopeless? No  Do you cry easily over simple problems? No  Activities of Daily Living In your present state of health, do you have any difficulty performing the following activities?:  Driving? No Managing money?  No Feeding yourself? No Getting from bed to chair? No Climbing a flight of stairs? No Preparing food and eating?: No Bathing or showering? No Getting dressed: No Getting to the toilet? No Using the toilet:No Moving around from place to place: No In the past year have you fallen or had a near fall?:No   Are you sexually active?  Yes  Do you have more than one partner?  No  Hearing Difficulties: Yes -- Does not wish to address further Do you often ask people to speak up or repeat themselves? Yes Do you experience ringing or noises in your ears? No Do you have difficulty understanding soft or whispered voices? No   Do you feel that you have a problem with memory? No  Do you often misplace items? No  Do you feel safe at home?  Yes  Cognitive Testing  Alert? Yes  Normal Appearance?Yes  Oriented to person? Yes  Place? Yes   Time? Yes  Recall of three objects?  Yes  Can perform simple calculations? Yes  Displays appropriate judgment?Yes  Can read the correct time from a watch face?Yes   Advanced Directives have been discussed with the patient? Yes   List the Names of Other Physician/Practitioners you currently use: 1.     Indicate any recent Medical Services you may have received from other than [facility] providers in the past year (date may be approximate).  Immunization History  Administered Date(s) Administered  . Influenza Split 12/31/2010, 01/14/2012  . Influenza Whole 12/03/2006, 11/10/2007, 10/31/2008, 12/10/2009  . Influenza, High Dose Seasonal PF 12/15/2012, 12/13/2013  . Pneumococcal Conjugate-13 07/28/2013  . Pneumococcal Polysaccharide-23 01/27/2001, 03/02/2007  . Td 01/28/2004    Screening Tests Health Maintenance  Topic Date Due  . ZOSTAVAX  05/12/1995  . TETANUS/TDAP  01/27/2014  . INFLUENZA VACCINE  08/28/2014  . COLONOSCOPY  09/24/2015  . PNA vac Low Risk Adult  Completed    All answers were reviewed with the patient and necessary referrals were made:  Leeanne Rio, PA-C   09/11/2014   History reviewed: allergies, current medications, past family history, past medical history, past social history, past surgical history and problem list  Review of Systems A comprehensive review of systems was negative.    Objective:     Blood pressure 134/62, pulse 56, temperature 97.5 F (36.4 C), temperature source Oral, height 5\' 7"  (1.702 m), weight 185 lb 3.2 oz (84.006 kg), SpO2 98 %. Body mass index is 29 kg/(m^2).  General appearance: alert, cooperative, appears stated age and no distress Head: Normocephalic, without obvious abnormality, atraumatic Eyes: conjunctivae/corneas clear. PERRL, EOM's intact. Fundi benign. Ears: normal TM's and external ear canals both ears Nose: Nares normal. Septum midline. Mucosa normal. No drainage or sinus tenderness. Throat: lips, mucosa, and tongue normal; teeth and gums normal Lungs: clear to auscultation bilaterally Heart: regular rate and rhythm, S1, S2 normal, no murmur, click, rub or gallop Abdomen: soft, non-tender; bowel sounds normal; no masses,  no organomegaly Extremities: extremities normal, atraumatic, no cyanosis or  edema Pulses: 2+ and symmetric Skin: Skin color, texture, turgor normal. No rashes or lesions Lymph nodes: Cervical, supraclavicular, and axillary nodes normal. Neurologic: Alert and oriented X 3, normal strength and tone. Normal symmetric reflexes. Normal coordination and gait     Assessment:     (1) Medicare Wellness, Subsequent (2) Screening for Ischemic Heart Disease (3) Hypertension w/ Hx CAD (4) Hyperlipidemia (5) BPH     Plan:     (1) During the course of the visit the patient was educated and counseled about appropriate screening and preventive services including:    Td vaccine  Screening electrocardiogram  Prostate cancer screening  Colorectal cancer screening  Diabetes screening  Glaucoma screening  Nutrition counseling   Advanced directives: has an advanced directive - a copy HAS NOT been provided.  Will obtain fasting labs today.  Diet review for nutrition referral? Yes ____  Not Indicated __x__  (2) EKG reveals sinus bradycardia. Asymptomatic on BB. Will check lipid panel today.  (3) Well controlled. Continue current medication regimen.  (4) Will repeat fasting lipid panel. Will restart Simvastatin based on results.  (5) Continue Proscar. Will assess PSA to monitor BPH and for prostate cancer screening.  Patient Instructions (the written plan) was given to the patient.  Medicare Attestation I have personally reviewed: The patient's medical and social history Their use of alcohol, tobacco or illicit drugs Their current medications and supplements The patient's functional ability including ADLs,fall risks, home safety risks, cognitive, and hearing and visual impairment Diet and physical activities Evidence for depression or mood disorders  The patient's weight, height, BMI, and visual acuity have been recorded in the chart.  I have made referrals, counseling, and provided education to the patient based on review of the above and I have provided  the patient with a written personalized care plan for preventive services.     Raiford Noble [place], [place]   09/11/2014

## 2014-09-11 NOTE — Patient Instructions (Signed)
Please go continue medications as directed.  Go to the lab for blood work. I will call you with your results. We will restart cholesterol medication which will be dosed based on lab results. Your Tetanus was updated today so you are good for 10 years.  Preventive Care for Adults A healthy lifestyle and preventive care can promote health and wellness. Preventive health guidelines for men include the following key practices:  A routine yearly physical is a good way to check with your health care provider about your health and preventative screening. It is a chance to share any concerns and updates on your health and to receive a thorough exam.  Visit your dentist for a routine exam and preventative care every 6 months. Brush your teeth twice a day and floss once a day. Good oral hygiene prevents tooth decay and gum disease.  The frequency of eye exams is based on your age, health, family medical history, use of contact lenses, and other factors. Follow your health care provider's recommendations for frequency of eye exams.  Eat a healthy diet. Foods such as vegetables, fruits, whole grains, low-fat dairy products, and lean protein foods contain the nutrients you need without too many calories. Decrease your intake of foods high in solid fats, added sugars, and salt. Eat the right amount of calories for you.Get information about a proper diet from your health care provider, if necessary.  Regular physical exercise is one of the most important things you can do for your health. Most adults should get at least 150 minutes of moderate-intensity exercise (any activity that increases your heart rate and causes you to sweat) each week. In addition, most adults need muscle-strengthening exercises on 2 or more days a week.  Maintain a healthy weight. The body mass index (BMI) is a screening tool to identify possible weight problems. It provides an estimate of body fat based on height and weight. Your health  care provider can find your BMI and can help you achieve or maintain a healthy weight.For adults 20 years and older:  A BMI below 18.5 is considered underweight.  A BMI of 18.5 to 24.9 is normal.  A BMI of 25 to 29.9 is considered overweight.  A BMI of 30 and above is considered obese.  Maintain normal blood lipids and cholesterol levels by exercising and minimizing your intake of saturated fat. Eat a balanced diet with plenty of fruit and vegetables. Blood tests for lipids and cholesterol should begin at age 25 and be repeated every 5 years. If your lipid or cholesterol levels are high, you are over 50, or you are at high risk for heart disease, you may need your cholesterol levels checked more frequently.Ongoing high lipid and cholesterol levels should be treated with medicines if diet and exercise are not working.  If you smoke, find out from your health care provider how to quit. If you do not use tobacco, do not start.  Lung cancer screening is recommended for adults aged 30-80 years who are at high risk for developing lung cancer because of a history of smoking. A yearly low-dose CT scan of the lungs is recommended for people who have at least a 30-pack-year history of smoking and are a current smoker or have quit within the past 15 years. A pack year of smoking is smoking an average of 1 pack of cigarettes a day for 1 year (for example: 1 pack a day for 30 years or 2 packs a day for 15 years). Yearly  screening should continue until the smoker has stopped smoking for at least 15 years. Yearly screening should be stopped for people who develop a health problem that would prevent them from having lung cancer treatment.  If you choose to drink alcohol, do not have more than 2 drinks per day. One drink is considered to be 12 ounces (355 mL) of beer, 5 ounces (148 mL) of wine, or 1.5 ounces (44 mL) of liquor.  Avoid use of street drugs. Do not share needles with anyone. Ask for help if you need  support or instructions about stopping the use of drugs.  High blood pressure causes heart disease and increases the risk of stroke. Your blood pressure should be checked at least every 1-2 years. Ongoing high blood pressure should be treated with medicines, if weight loss and exercise are not effective.  If you are 70-88 years old, ask your health care provider if you should take aspirin to prevent heart disease.  Diabetes screening involves taking a blood sample to check your fasting blood sugar level. This should be done once every 3 years, after age 28, if you are within normal weight and without risk factors for diabetes. Testing should be considered at a younger age or be carried out more frequently if you are overweight and have at least 1 risk factor for diabetes.  Colorectal cancer can be detected and often prevented. Most routine colorectal cancer screening begins at the age of 82 and continues through age 56. However, your health care provider may recommend screening at an earlier age if you have risk factors for colon cancer. On a yearly basis, your health care provider may provide home test kits to check for hidden blood in the stool. Use of a small camera at the end of a tube to directly examine the colon (sigmoidoscopy or colonoscopy) can detect the earliest forms of colorectal cancer. Talk to your health care provider about this at age 42, when routine screening begins. Direct exam of the colon should be repeated every 5-10 years through age 26, unless early forms of precancerous polyps or small growths are found.  People who are at an increased risk for hepatitis B should be screened for this virus. You are considered at high risk for hepatitis B if:  You were born in a country where hepatitis B occurs often. Talk with your health care provider about which countries are considered high risk.  Your parents were born in a high-risk country and you have not received a shot to protect  against hepatitis B (hepatitis B vaccine).  You have HIV or AIDS.  You use needles to inject street drugs.  You live with, or have sex with, someone who has hepatitis B.  You are a man who has sex with other men (MSM).  You get hemodialysis treatment.  You take certain medicines for conditions such as cancer, organ transplantation, and autoimmune conditions.  Hepatitis C blood testing is recommended for all people born from [birth year] through 1965 and any individual with known risks for hepatitis C.  Practice safe sex. Use condoms and avoid high-risk sexual practices to reduce the spread of sexually transmitted infections (STIs). STIs include gonorrhea, chlamydia, syphilis, trichomonas, herpes, HPV, and human immunodeficiency virus (HIV). Herpes, HIV, and HPV are viral illnesses that have no cure. They can result in disability, cancer, and death.  If you are at risk of being infected with HIV, it is recommended that you take a prescription medicine daily to prevent HIV  infection. This is called preexposure prophylaxis (PrEP). You are considered at risk if:  You are a man who has sex with other men (MSM) and have other risk factors.  You are a heterosexual man, are sexually active, and are at increased risk for HIV infection.  You take drugs by injection.  You are sexually active with a partner who has HIV.  Talk with your health care provider about whether you are at high risk of being infected with HIV. If you choose to begin PrEP, you should first be tested for HIV. You should then be tested every 3 months for as long as you are taking PrEP.  A one-time screening for abdominal aortic aneurysm (AAA) and surgical repair of large AAAs by ultrasound are recommended for men ages 71 to 66 years who are current or former smokers.  Healthy men should no longer receive prostate-specific antigen (PSA) blood tests as part of routine cancer screening. Talk with your health care provider about  prostate cancer screening.  Testicular cancer screening is not recommended for adult males who have no symptoms. Screening includes self-exam, a health care provider exam, and other screening tests. Consult with your health care provider about any symptoms you have or any concerns you have about testicular cancer.  Use sunscreen. Apply sunscreen liberally and repeatedly throughout the day. You should seek shade when your shadow is shorter than you. Protect yourself by wearing long sleeves, pants, a wide-brimmed hat, and sunglasses year round, whenever you are outdoors.  Once a month, do a whole-body skin exam, using a mirror to look at the skin on your back. Tell your health care provider about new moles, moles that have irregular borders, moles that are larger than a pencil eraser, or moles that have changed in shape or color.  Stay current with required vaccines (immunizations).  Influenza vaccine. All adults should be immunized every year.  Tetanus, diphtheria, and acellular pertussis (Td, Tdap) vaccine. An adult who has not previously received Tdap or who does not know his vaccine status should receive 1 dose of Tdap. This initial dose should be followed by tetanus and diphtheria toxoids (Td) booster doses every 10 years. Adults with an unknown or incomplete history of completing a 3-dose immunization series with Td-containing vaccines should begin or complete a primary immunization series including a Tdap dose. Adults should receive a Td booster every 10 years.  Varicella vaccine. An adult without evidence of immunity to varicella should receive 2 doses or a second dose if he has previously received 1 dose.  Human papillomavirus (HPV) vaccine. Males aged 50-21 years who have not received the vaccine previously should receive the 3-dose series. Males aged 22-26 years may be immunized. Immunization is recommended through the age of 78 years for any male who has sex with males and did not get any  or all doses earlier. Immunization is recommended for any person with an immunocompromised condition through the age of 25 years if he did not get any or all doses earlier. During the 3-dose series, the second dose should be obtained 4-8 weeks after the first dose. The third dose should be obtained 24 weeks after the first dose and 16 weeks after the second dose.  Zoster vaccine. One dose is recommended for adults aged 41 years or older unless certain conditions are present.  Measles, mumps, and rubella (MMR) vaccine. Adults born before 75 generally are considered immune to measles and mumps. Adults born in [birth year] or later should have 1 or  more doses of MMR vaccine unless there is a contraindication to the vaccine or there is laboratory evidence of immunity to each of the three diseases. A routine second dose of MMR vaccine should be obtained at least 28 days after the first dose for students attending postsecondary schools, health care workers, or international travelers. People who received inactivated measles vaccine or an unknown type of measles vaccine during 1963-1967 should receive 2 doses of MMR vaccine. People who received inactivated mumps vaccine or an unknown type of mumps vaccine before 1979 and are at high risk for mumps infection should consider immunization with 2 doses of MMR vaccine. Unvaccinated health care workers born before 30 who lack laboratory evidence of measles, mumps, or rubella immunity or laboratory confirmation of disease should consider measles and mumps immunization with 2 doses of MMR vaccine or rubella immunization with 1 dose of MMR vaccine.  Pneumococcal 13-valent conjugate (PCV13) vaccine. When indicated, a person who is uncertain of his immunization history and has no record of immunization should receive the PCV13 vaccine. An adult aged 75 years or older who has certain medical conditions and has not been previously immunized should receive 1 dose of PCV13 vaccine.  This PCV13 should be followed with a dose of pneumococcal polysaccharide (PPSV23) vaccine. The PPSV23 vaccine dose should be obtained at least 8 weeks after the dose of PCV13 vaccine. An adult aged 33 years or older who has certain medical conditions and previously received 1 or more doses of PPSV23 vaccine should receive 1 dose of PCV13. The PCV13 vaccine dose should be obtained 1 or more years after the last PPSV23 vaccine dose.  Pneumococcal polysaccharide (PPSV23) vaccine. When PCV13 is also indicated, PCV13 should be obtained first. All adults aged 36 years and older should be immunized. An adult younger than age 18 years who has certain medical conditions should be immunized. Any person who resides in a nursing home or long-term care facility should be immunized. An adult smoker should be immunized. People with an immunocompromised condition and certain other conditions should receive both PCV13 and PPSV23 vaccines. People with human immunodeficiency virus (HIV) infection should be immunized as soon as possible after diagnosis. Immunization during chemotherapy or radiation therapy should be avoided. Routine use of PPSV23 vaccine is not recommended for American Indians, [place] Natives, or people younger than 65 years unless there are medical conditions that require PPSV23 vaccine. When indicated, people who have unknown immunization and have no record of immunization should receive PPSV23 vaccine. One-time revaccination 5 years after the first dose of PPSV23 is recommended for people aged 19-64 years who have chronic kidney failure, nephrotic syndrome, asplenia, or immunocompromised conditions. People who received 1-2 doses of PPSV23 before age 56 years should receive another dose of PPSV23 vaccine at age 71 years or later if at least 5 years have passed since the previous dose. Doses of PPSV23 are not needed for people immunized with PPSV23 at or after age 71 years.  Meningococcal vaccine. Adults with  asplenia or persistent complement component deficiencies should receive 2 doses of quadrivalent meningococcal conjugate (MenACWY-D) vaccine. The doses should be obtained at least 2 months apart. Microbiologists working with certain meningococcal bacteria, [employer], people at risk during an outbreak, and people who travel to or live in countries with a high rate of meningitis should be immunized. A first-year college student up through age 84 years who is living in a residence hall should receive a dose if he did not receive a dose on  or after his 16th birthday. Adults who have certain high-risk conditions should receive one or more doses of vaccine.  Hepatitis A vaccine. Adults who wish to be protected from this disease, have certain high-risk conditions, work with hepatitis A-infected animals, work in hepatitis A research labs, or travel to or work in countries with a high rate of hepatitis A should be immunized. Adults who were previously unvaccinated and who anticipate close contact with an international adoptee during the first 60 days after arrival in the [place] from a country with a high rate of hepatitis A should be immunized.  Hepatitis B vaccine. Adults should be immunized if they wish to be protected from this disease, have certain high-risk conditions, may be exposed to blood or other infectious body fluids, are household contacts or sex partners of hepatitis B positive people, are clients or workers in certain care facilities, or travel to or work in countries with a high rate of hepatitis B.  Haemophilus influenzae type b (Hib) vaccine. A previously unvaccinated person with asplenia or sickle cell disease or having a scheduled splenectomy should receive 1 dose of Hib vaccine. Regardless of previous immunization, a recipient of a hematopoietic stem cell transplant should receive a 3-dose series 6-12 months after his successful transplant. Hib vaccine is not recommended for adults  with HIV infection. Preventive Service / Frequency Ages 20 to 72  Blood pressure check.** / Every 1 to 2 years.  Lipid and cholesterol check.** / Every 5 years beginning at age 37.  Hepatitis C blood test.** / For any individual with known risks for hepatitis C.  Skin self-exam. / Monthly.  Influenza vaccine. / Every year.  Tetanus, diphtheria, and acellular pertussis (Tdap, Td) vaccine.** / Consult your health care provider. 1 dose of Td every 10 years.  Varicella vaccine.** / Consult your health care provider.  HPV vaccine. / 3 doses over 6 months, if 34 or younger.  Measles, mumps, rubella (MMR) vaccine.** / You need at least 1 dose of MMR if you were born in 1957 or later. You may also need a second dose.  Pneumococcal 13-valent conjugate (PCV13) vaccine.** / Consult your health care provider.  Pneumococcal polysaccharide (PPSV23) vaccine.** / 1 to 2 doses if you smoke cigarettes or if you have certain conditions.  Meningococcal vaccine.** / 1 dose if you are age 39 to 8 years and a Market researcher living in a residence hall, or have one of several medical conditions. You may also need additional booster doses.  Hepatitis A vaccine.** / Consult your health care provider.  Hepatitis B vaccine.** / Consult your health care provider.  Haemophilus influenzae type b (Hib) vaccine.** / Consult your health care provider. Ages 76 to 96  Blood pressure check.** / Every 1 to 2 years.  Lipid and cholesterol check.** / Every 5 years beginning at age 41.  Lung cancer screening. / Every year if you are aged 35-80 years and have a 30-pack-year history of smoking and currently smoke or have quit within the past 15 years. Yearly screening is stopped once you have quit smoking for at least 15 years or develop a health problem that would prevent you from having lung cancer treatment.  Fecal occult blood test (FOBT) of stool. / Every year beginning at age 57 and continuing until  age 25. You may not have to do this test if you get a colonoscopy every 10 years.  Flexible sigmoidoscopy** or colonoscopy.** / Every 5 years for a flexible sigmoidoscopy or  every 10 years for a colonoscopy beginning at age 43 and continuing until age 56.  Hepatitis C blood test.** / For all people born from [birth year] through 1965 and any individual with known risks for hepatitis C.  Skin self-exam. / Monthly.  Influenza vaccine. / Every year.  Tetanus, diphtheria, and acellular pertussis (Tdap/Td) vaccine.** / Consult your health care provider. 1 dose of Td every 10 years.  Varicella vaccine.** / Consult your health care provider.  Zoster vaccine.** / 1 dose for adults aged 76 years or older.  Measles, mumps, rubella (MMR) vaccine.** / You need at least 1 dose of MMR if you were born in 1957 or later. You may also need a second dose.  Pneumococcal 13-valent conjugate (PCV13) vaccine.** / Consult your health care provider.  Pneumococcal polysaccharide (PPSV23) vaccine.** / 1 to 2 doses if you smoke cigarettes or if you have certain conditions.  Meningococcal vaccine.** / Consult your health care provider.  Hepatitis A vaccine.** / Consult your health care provider.  Hepatitis B vaccine.** / Consult your health care provider.  Haemophilus influenzae type b (Hib) vaccine.** / Consult your health care provider. Ages 31 and over  Blood pressure check.** / Every 1 to 2 years.  Lipid and cholesterol check.**/ Every 5 years beginning at age 19.  Lung cancer screening. / Every year if you are aged 39-80 years and have a 30-pack-year history of smoking and currently smoke or have quit within the past 15 years. Yearly screening is stopped once you have quit smoking for at least 15 years or develop a health problem that would prevent you from having lung cancer treatment.  Fecal occult blood test (FOBT) of stool. / Every year beginning at age 38 and continuing until age 44. You may not have to  do this test if you get a colonoscopy every 10 years.  Flexible sigmoidoscopy** or colonoscopy.** / Every 5 years for a flexible sigmoidoscopy or every 10 years for a colonoscopy beginning at age 4 and continuing until age 83.  Hepatitis C blood test.** / For all people born from [birth year] through 1965 and any individual with known risks for hepatitis C.  Abdominal aortic aneurysm (AAA) screening.** / A one-time screening for ages 44 to 62 years who are current or former smokers.  Skin self-exam. / Monthly.  Influenza vaccine. / Every year.  Tetanus, diphtheria, and acellular pertussis (Tdap/Td) vaccine.** / 1 dose of Td every 10 years.  Varicella vaccine.** / Consult your health care provider.  Zoster vaccine.** / 1 dose for adults aged 58 years or older.  Pneumococcal 13-valent conjugate (PCV13) vaccine.** / Consult your health care provider.  Pneumococcal polysaccharide (PPSV23) vaccine.** / 1 dose for all adults aged 12 years and older.  Meningococcal vaccine.** / Consult your health care provider.  Hepatitis A vaccine.** / Consult your health care provider.  Hepatitis B vaccine.** / Consult your health care provider.  Haemophilus influenzae type b (Hib) vaccine.** / Consult your health care provider. **Family history and personal history of risk and conditions may change your health care provider's recommendations. Document Released: 03/11/2001 Document Revised: 01/18/2013 Document Reviewed: 06/10/2010 [facility] Patient Information 2015 [place], [place]. This information is not intended to replace advice given to you by your health care provider. Make sure you discuss any questions you have with your health care provider.

## 2014-09-12 ENCOUNTER — Telehealth: Payer: Self-pay | Admitting: *Deleted

## 2014-09-12 DIAGNOSIS — E785 Hyperlipidemia, unspecified: Secondary | ICD-10-CM

## 2014-09-12 MED ORDER — SIMVASTATIN 20 MG PO TABS: 20.0000 mg | ORAL_TABLET | Freq: Every day | ORAL | Status: DC

## 2014-09-12 NOTE — Telephone Encounter (Signed)
Called and spoke with the pt's wife and informed her of the pt's recent lab results and note.  The wife verbalized understanding.  New prescription was sent to the pharmacy by e-script.  The wife stated that they will call back to schedule the 6 month follow-up.//AB/CMA

## 2014-09-12 NOTE — Telephone Encounter (Signed)
-----   Message from Brunetta Jeans, PA-C sent at 09/11/2014  3:53 PM EDT ----- Labs great overall. Cholesterol elevated -- both TGL and LDL. Needs to restart Simvastatin. Ok to refill -- 90 day supply with 1 refill.  Proscar helping prostate as PSA has decreased. Follow-up 6 months.

## 2014-10-20 ENCOUNTER — Telehealth: Payer: Self-pay | Admitting: Family Medicine

## 2014-10-20 MED ORDER — METOPROLOL SUCCINATE ER 50 MG PO TB24: 50.0000 mg | ORAL_TABLET | Freq: Every day | ORAL | Status: DC

## 2014-10-20 NOTE — Telephone Encounter (Signed)
Relation to QI:WLNL Call back number: [phone] Pharmacy: [facility] [address] [place] [phone] (Phone) [phone] (Fax)         Reason for call:  Patient requesting a refill metoprolol succinate (TOPROL-XL) 50 MG 24 hr tablet

## 2014-10-20 NOTE — Telephone Encounter (Signed)
Refill sent in to pharmacy 

## 2014-11-02 ENCOUNTER — Ambulatory Visit (INDEPENDENT_AMBULATORY_CARE_PROVIDER_SITE_OTHER): Payer: Medicare Other | Admitting: Podiatry

## 2014-11-02 ENCOUNTER — Encounter: Payer: Self-pay | Admitting: Podiatry

## 2014-11-02 VITALS — BP 152/90 | HR 60 | Resp 12

## 2014-11-02 DIAGNOSIS — M722 Plantar fascial fibromatosis: Secondary | ICD-10-CM

## 2014-11-02 MED ORDER — TRIAMCINOLONE ACETONIDE 10 MG/ML IJ SUSP
10.0000 mg | Freq: Once | INTRAMUSCULAR | Status: AC
  Administered 2014-11-02: 10 mg

## 2014-11-02 NOTE — Progress Notes (Signed)
Subjective:     Patient ID: Robert Pacheco, male   DOB: 02/05/1933, 81 y.o.   MRN: [id number]  HPI patient states my heel started to hurt me again in the last few weeks and before that it was doing better and I may have been on it too much   Review of Systems     Objective:   Physical Exam Neurovascular status intact with inflammation and pain in the plantar left heel at the insertional point tendon into the calcaneus    Assessment:     Plantar fasciitis left with inflammation    Plan:     Injected the left plantar fashion 3 mg Kenalog 5 mg Xylocaine and instructed on physical therapy ice and if symptoms persist patient will be seen back

## 2014-11-06 ENCOUNTER — Telehealth: Payer: Self-pay | Admitting: Physician Assistant

## 2014-11-06 DIAGNOSIS — N400 Enlarged prostate without lower urinary tract symptoms: Secondary | ICD-10-CM

## 2014-11-06 NOTE — Telephone Encounter (Signed)
Caller name Relation to IW:LNLG Call back number:[phone] Pharmacy: [facility] [address], [place] [phone]) [phone] (Fax)         Reason for call:  Patient requesting a refill finasteride (PROSCAR) 5 MG tablet

## 2014-11-07 MED ORDER — FINASTERIDE 5 MG PO TABS: 5.0000 mg | ORAL_TABLET | Freq: Every day | ORAL | Status: DC

## 2014-11-07 NOTE — Telephone Encounter (Signed)
Refill sent.

## 2014-12-13 ENCOUNTER — Other Ambulatory Visit: Payer: Self-pay | Admitting: Physician Assistant

## 2014-12-13 DIAGNOSIS — E785 Hyperlipidemia, unspecified: Secondary | ICD-10-CM

## 2014-12-13 DIAGNOSIS — I25810 Atherosclerosis of coronary artery bypass graft(s) without angina pectoris: Secondary | ICD-10-CM

## 2014-12-13 MED ORDER — NITROGLYCERIN 0.4 MG SL SUBL: 0.4000 mg | SUBLINGUAL_TABLET | SUBLINGUAL | Status: DC | PRN

## 2014-12-13 MED ORDER — SIMVASTATIN 20 MG PO TABS: 20.0000 mg | ORAL_TABLET | Freq: Every day | ORAL | Status: DC

## 2014-12-13 NOTE — Telephone Encounter (Signed)
Rx sent in to pharmacy. 

## 2014-12-13 NOTE — Telephone Encounter (Signed)
Caller name: Self   Can be reached: [phone]  Pharmacy:  [facility] [place], [place] [phone] (Phone) [phone] (Fax)         Reason for call: Patient request refill on simvastatin (ZOCOR) 20 MG tablet [id number] and nitroGLYCERIN (NITROSTAT) 0.4 MG SL tablet [id number]

## 2015-01-02 ENCOUNTER — Telehealth: Payer: Self-pay | Admitting: Physician Assistant

## 2015-01-02 NOTE — Telephone Encounter (Signed)
Noted in HM. 

## 2015-01-02 NOTE — Telephone Encounter (Signed)
Patient had Flu Shot at [facility] in [place], [place] around the beginning of November 2016

## 2015-04-20 ENCOUNTER — Telehealth: Payer: Self-pay | Admitting: Physician Assistant

## 2015-04-20 MED ORDER — METOPROLOL SUCCINATE ER 50 MG PO TB24: 50.0000 mg | ORAL_TABLET | Freq: Every day | ORAL | Status: DC

## 2015-04-20 NOTE — Telephone Encounter (Signed)
Relation to pt:self °Call back number: [phone] °Pharmacy: °[id number] - [place], [place] - [address] [phone] (Phone) [phone] (Fax)  °  °  ° ° ° °Reason for call:  °Patient requesting a refill metoprolol succinate (TOPROL-XL) 50 MG 24 hr tablet  °

## 2015-04-20 NOTE — Telephone Encounter (Signed)
Patient scheduled for 04/27/15 at 7:15am

## 2015-04-20 NOTE — Telephone Encounter (Signed)
30-day supply sent in. He is overdue for follow-up. He needs to schedule within next month.

## 2015-04-27 ENCOUNTER — Ambulatory Visit: Payer: Medicare Other | Admitting: Physician Assistant

## 2015-04-27 ENCOUNTER — Ambulatory Visit (INDEPENDENT_AMBULATORY_CARE_PROVIDER_SITE_OTHER): Payer: Medicare Other | Admitting: Physician Assistant

## 2015-04-27 ENCOUNTER — Encounter: Payer: Self-pay | Admitting: Physician Assistant

## 2015-04-27 VITALS — BP 118/64 | HR 58 | Temp 97.9°F | Ht 67.0 in | Wt 182.2 lb

## 2015-04-27 DIAGNOSIS — I10 Essential (primary) hypertension: Secondary | ICD-10-CM

## 2015-04-27 DIAGNOSIS — E785 Hyperlipidemia, unspecified: Secondary | ICD-10-CM | POA: Diagnosis not present

## 2015-04-27 LAB — LIPID PANEL
CHOL/HDL RATIO: 3
CHOLESTEROL: 110 mg/dL (ref 0–200)
HDL: 35.1 mg/dL — ABNORMAL LOW (ref >39.00)
LDL CALC: 56 mg/dL (ref 0–99)
NONHDL: 74.96
Triglycerides: 97 mg/dL (ref 0.0–149.0)
VLDL: 19.4 mg/dL (ref 0.0–40.0)

## 2015-04-27 LAB — COMPREHENSIVE METABOLIC PANEL
ALT: 10 U/L (ref 0–53)
AST: 17 U/L (ref 0–37)
Albumin: 4 g/dL (ref 3.5–5.2)
Alkaline Phosphatase: 74 U/L (ref 39–117)
BUN: 16 mg/dL (ref 6–23)
CHLORIDE: 105 meq/L (ref 96–112)
CO2: 29 meq/L (ref 19–32)
Calcium: 9.3 mg/dL (ref 8.4–10.5)
Creatinine, Ser: 0.99 mg/dL (ref 0.40–1.50)
GFR: 77.32 mL/min (ref >60.00)
GLUCOSE: 95 mg/dL (ref 70–99)
POTASSIUM: 4.3 meq/L (ref 3.5–5.1)
SODIUM: 140 meq/L (ref 135–145)
Total Bilirubin: 0.6 mg/dL (ref 0.2–1.2)
Total Protein: 6.8 g/dL (ref 6.0–8.3)

## 2015-04-27 NOTE — Progress Notes (Signed)
Pre visit review using our clinic review tool, if applicable. No additional management support is needed unless otherwise documented below in the visit note. 

## 2015-04-27 NOTE — Assessment & Plan Note (Signed)
Stable. Asymptomatic. Will continue current regimen. 

## 2015-04-27 NOTE — Patient Instructions (Signed)
Please go the lab for blood work. I will call with your results. Continue medications as directed. We will alter your regimen if indicated by results.  Talk with your insurance rep about covering the shingles vaccine.  Follow-up 6 months for a physical.

## 2015-04-27 NOTE — Assessment & Plan Note (Signed)
Patient taking medications as directed. Will continue dietary changes and exercise regimen. Will check CMP and lipid panel today.

## 2015-04-27 NOTE — Progress Notes (Signed)
Patient presents to clinic today for 6 month follow-up of hyperlipidemia and hypertension. Endorses taking medications as directed. Is currently on Toproil XL, simvastatin and fish oils daily. Denies side effects of medication. Patient denies chest pain, palpitations, lightheadedness, dizziness, vision changes or frequent headaches.  BP Readings from Last 3 Encounters:  04/27/15 118/64  11/02/14 152/90  09/11/14 134/62    Past Medical History  Diagnosis Date  . Hyperlipidemia   . Hypertension   . Allergy   . Personal history of colonic adenomas 05/10/2012    Current Outpatient Prescriptions on File Prior to Visit  Medication Sig Dispense Refill  . aspirin 81 MG chewable tablet Chew 81 mg by mouth.    . finasteride (PROSCAR) 5 MG tablet Take 1 tablet (5 mg total) by mouth daily. 100 tablet 1  . fish oil-omega-3 fatty acids 1000 MG capsule Take 2 g by mouth daily.      [name] ketoconazole (NIZORAL) 2 % cream APPLY CREAM TOPICALLY TWICE DAILY 60 g 1  . metoprolol succinate (TOPROL-XL) 50 MG 24 hr tablet Take 1 tablet (50 mg total) by mouth daily. Take with or immediately following a meal. 30 tablet 0  . nitroGLYCERIN (NITROSTAT) 0.4 MG SL tablet Place 1 tablet (0.4 mg total) under the tongue every 5 (five) minutes as needed. 25 tablet 1  . Saw Palmetto 450 MG CAPS Take 1 tablet by mouth.    . simvastatin (ZOCOR) 20 MG tablet Take 1 tablet (20 mg total) by mouth at bedtime. 90 tablet 1   No current facility-administered medications on file prior to visit.    Allergies  Allergen Reactions  . Other Nausea Only    UNKNOWN PAIN MED    Family History  Problem Relation Age of Onset  . Heart disease Father   . Colon cancer Brother     2 brothers  . Healthy Child     x 4  . Healthy Grandchild     x 11    Social History   Social History  . Marital Status: Married    Spouse Name: N/A  . Number of Children: 4  . Years of Education: N/A   Occupational History  .      [employer]   Social History Main Topics  . Smoking status: Former Research scientist (life sciences)  . Smokeless tobacco: Never Used  . Alcohol Use: No  . Drug Use: No  . Sexual Activity: Not Asked   Other Topics Concern  . None   Social History Narrative   Review of Systems - See HPI.  All other ROS are negative.  BP 118/64 mmHg  Pulse 58  Temp(Src) 97.9 F (36.6 C) (Oral)  Ht 5\' 7"  (1.702 m)  Wt 182 lb 3.2 oz (82.645 kg)  BMI 28.53 kg/m2  SpO2 98%  Physical Exam  Constitutional: He is oriented to person, place, and time and well-developed, well-nourished, and in no distress.  HENT:  Head: Normocephalic and atraumatic.  Eyes: Conjunctivae are normal.  Cardiovascular: Normal rate, regular rhythm, normal heart sounds and intact distal pulses.   Pulmonary/Chest: Effort normal.  Neurological: He is alert and oriented to person, place, and time.  Skin: Skin is warm and dry.  Psychiatric: Affect normal.  Vitals reviewed.   No results found for this or any previous visit (from the past 2160 hour(s)).  Assessment/Plan: Hyperlipidemia Patient taking medications as directed. Will continue dietary changes and exercise regimen. Will check CMP and lipid panel today.  Essential  hypertension Stable. Asymptomatic. Will continue current regimen.

## 2015-05-24 ENCOUNTER — Other Ambulatory Visit: Payer: Self-pay

## 2015-05-24 ENCOUNTER — Other Ambulatory Visit: Payer: Self-pay | Admitting: Physician Assistant

## 2015-05-24 MED ORDER — METOPROLOL SUCCINATE ER 50 MG PO TB24: 50.0000 mg | ORAL_TABLET | Freq: Every day | ORAL | Status: DC

## 2015-05-24 NOTE — Telephone Encounter (Signed)
Medication sent to pharmacy  

## 2015-05-24 NOTE — Telephone Encounter (Signed)
Refill request for metoprolol succinate Rx.    Pharmacy: [facility] [place], [place]

## 2015-05-30 ENCOUNTER — Encounter: Payer: Self-pay | Admitting: Internal Medicine

## 2015-06-12 ENCOUNTER — Telehealth: Payer: Self-pay | Admitting: Physician Assistant

## 2015-06-12 DIAGNOSIS — E785 Hyperlipidemia, unspecified: Secondary | ICD-10-CM

## 2015-06-12 DIAGNOSIS — N400 Enlarged prostate without lower urinary tract symptoms: Secondary | ICD-10-CM

## 2015-06-12 MED ORDER — SIMVASTATIN 20 MG PO TABS: 20.0000 mg | ORAL_TABLET | Freq: Every day | ORAL | Status: DC

## 2015-06-12 MED ORDER — FINASTERIDE 5 MG PO TABS: 5.0000 mg | ORAL_TABLET | Freq: Every day | ORAL | Status: DC

## 2015-06-12 NOTE — Telephone Encounter (Signed)
°  Relation to [id number] Call back [phone] Pharmacy: [facility] [place], [place] [phone] (Phone) [phone] (Fax)         Reason for call:  Patient requesting a refill finasteride (PROSCAR) 5 MG tablet and simvastatin (ZOCOR) 20 MG tablet

## 2015-06-12 NOTE — Telephone Encounter (Signed)
Refill sent per [facility] refill protocol/SLS  

## 2015-06-22 ENCOUNTER — Other Ambulatory Visit: Payer: Self-pay | Admitting: Physician Assistant

## 2015-06-22 MED ORDER — METOPROLOL SUCCINATE ER 50 MG PO TB24: 50.0000 mg | ORAL_TABLET | Freq: Every day | ORAL | Status: DC

## 2015-06-22 NOTE — Telephone Encounter (Signed)
Caller name: Self  Can be reached: [phone]   Pharmacy:  [facility] [place], [place] [phone] (Phone) [phone] (Fax)        Reason for call: Request refill on metoprolol succinate (TOPROL-XL) 50 MG 24 hr tablet [place]:[id number]

## 2015-06-22 NOTE — Telephone Encounter (Signed)
Rx sent 

## 2015-08-24 ENCOUNTER — Telehealth: Payer: Self-pay | Admitting: *Deleted

## 2015-08-24 MED ORDER — KETOCONAZOLE 2 % EX CREA: TOPICAL_CREAM | CUTANEOUS | 1 refills | Status: DC

## 2015-08-24 NOTE — Telephone Encounter (Signed)
Rx request to pharmacy/SLS  

## 2015-09-28 ENCOUNTER — Encounter: Payer: Self-pay | Admitting: Physician Assistant

## 2015-09-28 ENCOUNTER — Ambulatory Visit (INDEPENDENT_AMBULATORY_CARE_PROVIDER_SITE_OTHER): Payer: Medicare Other | Admitting: Physician Assistant

## 2015-09-28 VITALS — BP 138/70 | HR 55 | Temp 98.0°F | Resp 14 | Ht 67.0 in | Wt 183.2 lb

## 2015-09-28 DIAGNOSIS — Z0000 Encounter for general adult medical examination without abnormal findings: Secondary | ICD-10-CM

## 2015-09-28 DIAGNOSIS — Y92009 Unspecified place in unspecified non-institutional (private) residence as the place of occurrence of the external cause: Secondary | ICD-10-CM

## 2015-09-28 DIAGNOSIS — Y92099 Unspecified place in other non-institutional residence as the place of occurrence of the external cause: Secondary | ICD-10-CM

## 2015-09-28 DIAGNOSIS — E785 Hyperlipidemia, unspecified: Secondary | ICD-10-CM | POA: Diagnosis not present

## 2015-09-28 DIAGNOSIS — N400 Enlarged prostate without lower urinary tract symptoms: Secondary | ICD-10-CM | POA: Diagnosis not present

## 2015-09-28 DIAGNOSIS — I10 Essential (primary) hypertension: Secondary | ICD-10-CM | POA: Diagnosis not present

## 2015-09-28 DIAGNOSIS — W19XXXA Unspecified fall, initial encounter: Secondary | ICD-10-CM

## 2015-09-28 LAB — LIPID PANEL
CHOLESTEROL: 116 mg/dL (ref 0–200)
HDL: 36 mg/dL — AB (ref >39.00)
LDL Cholesterol: 57 mg/dL (ref 0–99)
NONHDL: 79.97
TRIGLYCERIDES: 115 mg/dL (ref 0.0–149.0)
Total CHOL/HDL Ratio: 3
VLDL: 23 mg/dL (ref 0.0–40.0)

## 2015-09-28 LAB — COMPREHENSIVE METABOLIC PANEL
ALBUMIN: 4 g/dL (ref 3.5–5.2)
ALK PHOS: 73 U/L (ref 39–117)
ALT: 10 U/L (ref 0–53)
AST: 16 U/L (ref 0–37)
BILIRUBIN TOTAL: 0.7 mg/dL (ref 0.2–1.2)
BUN: 15 mg/dL (ref 6–23)
CALCIUM: 9 mg/dL (ref 8.4–10.5)
CO2: 28 mEq/L (ref 19–32)
Chloride: 107 mEq/L (ref 96–112)
Creatinine, Ser: 0.99 mg/dL (ref 0.40–1.50)
GFR: 77.23 mL/min (ref >60.00)
GLUCOSE: 99 mg/dL (ref 70–99)
Potassium: 4.3 mEq/L (ref 3.5–5.1)
Sodium: 139 mEq/L (ref 135–145)
TOTAL PROTEIN: 6.9 g/dL (ref 6.0–8.3)

## 2015-09-28 LAB — CBC
HCT: 42.9 % (ref 39.0–52.0)
Hemoglobin: 14.7 g/dL (ref 13.0–17.0)
MCHC: 34.2 g/dL (ref 30.0–36.0)
MCV: 91 fl (ref 78.0–100.0)
Platelets: 223 10*3/uL (ref 150.0–400.0)
RBC: 4.71 Mil/uL (ref 4.22–5.81)
RDW: 13.2 % (ref 11.5–15.5)
WBC: 6.2 10*3/uL (ref 4.0–10.5)

## 2015-09-28 NOTE — Patient Instructions (Signed)
Please go to the lab for blood work.   Our office will call you with your results unless you have chosen to receive results via MyChart.  If your blood work is normal we will follow-up each year for physicals and as scheduled for chronic medical problems.  If anything is abnormal we will treat accordingly and get you in for a follow-up.  Please decrease your Metoprolol to 25 mg daily (1/2 tablet). Continue checking BP measurements and write these down. Follow-up in 2 weeks for a nurse visit to recheck BP.  Preventive Care for Adults, Male A healthy lifestyle and preventive care can promote health and wellness. Preventive health guidelines for men include the following key practices:  A routine yearly physical is a good way to check with your health care provider about your health and preventative screening. It is a chance to share any concerns and updates on your health and to receive a thorough exam.  Visit your dentist for a routine exam and preventative care every 6 months. Brush your teeth twice a day and floss once a day. Good oral hygiene prevents tooth decay and gum disease.  The frequency of eye exams is based on your age, health, family medical history, use of contact lenses, and other factors. Follow your health care provider's recommendations for frequency of eye exams.  Eat a healthy diet. Foods such as vegetables, fruits, whole grains, low-fat dairy products, and lean protein foods contain the nutrients you need without too many calories. Decrease your intake of foods high in solid fats, added sugars, and salt. Eat the right amount of calories for you.Get information about a proper diet from your health care provider, if necessary.  Regular physical exercise is one of the most important things you can do for your health. Most adults should get at least 150 minutes of moderate-intensity exercise (any activity that increases your heart rate and causes you to sweat) each week. In  addition, most adults need muscle-strengthening exercises on 2 or more days a week.  Maintain a healthy weight. The body mass index (BMI) is a screening tool to identify possible weight problems. It provides an estimate of body fat based on height and weight. Your health care provider can find your BMI and can help you achieve or maintain a healthy weight.For adults 20 years and older:  A BMI below 18.5 is considered underweight.  A BMI of 18.5 to 24.9 is normal.  A BMI of 25 to 29.9 is considered overweight.  A BMI of 30 and above is considered obese.  Maintain normal blood lipids and cholesterol levels by exercising and minimizing your intake of saturated fat. Eat a balanced diet with plenty of fruit and vegetables. Blood tests for lipids and cholesterol should begin at age 39 and be repeated every 5 years. If your lipid or cholesterol levels are high, you are over 50, or you are at high risk for heart disease, you may need your cholesterol levels checked more frequently.Ongoing high lipid and cholesterol levels should be treated with medicines if diet and exercise are not working.  If you smoke, find out from your health care provider how to quit. If you do not use tobacco, do not start.  Lung cancer screening is recommended for adults aged 55-80 years who are at high risk for developing lung cancer because of a history of smoking. A yearly low-dose CT scan of the lungs is recommended for people who have at least a 30-pack-year history of smoking and  are a current smoker or have quit within the past 15 years. A pack year of smoking is smoking an average of 1 pack of cigarettes a day for 1 year (for example: 1 pack a day for 30 years or 2 packs a day for 15 years). Yearly screening should continue until the smoker has stopped smoking for at least 15 years. Yearly screening should be stopped for people who develop a health problem that would prevent them from having lung cancer treatment.  If  you choose to drink alcohol, do not have more than 2 drinks per day. One drink is considered to be 12 ounces (355 mL) of beer, 5 ounces (148 mL) of wine, or 1.5 ounces (44 mL) of liquor.  Avoid use of street drugs. Do not share needles with anyone. Ask for help if you need support or instructions about stopping the use of drugs.  High blood pressure causes heart disease and increases the risk of stroke. Your blood pressure should be checked at least every 1-2 years. Ongoing high blood pressure should be treated with medicines, if weight loss and exercise are not effective.  If you are 22-16 years old, ask your health care provider if you should take aspirin to prevent heart disease.  Diabetes screening is done by taking a blood sample to check your blood glucose level after you have not eaten for a certain period of time (fasting). If you are not overweight and you do not have risk factors for diabetes, you should be screened once every 3 years starting at age 28. If you are overweight or obese and you are 67-27 years of age, you should be screened for diabetes every year as part of your cardiovascular risk assessment.  Colorectal cancer can be detected and often prevented. Most routine colorectal cancer screening begins at the age of 77 and continues through age 38. However, your health care provider may recommend screening at an earlier age if you have risk factors for colon cancer. On a yearly basis, your health care provider may provide home test kits to check for hidden blood in the stool. Use of a small camera at the end of a tube to directly examine the colon (sigmoidoscopy or colonoscopy) can detect the earliest forms of colorectal cancer. Talk to your health care provider about this at age 63, when routine screening begins. Direct exam of the colon should be repeated every 5-10 years through age 76, unless early forms of precancerous polyps or small growths are found.  People who are at an  increased risk for hepatitis B should be screened for this virus. You are considered at high risk for hepatitis B if:  You were born in a country where hepatitis B occurs often. Talk with your health care provider about which countries are considered high risk.  Your parents were born in a high-risk country and you have not received a shot to protect against hepatitis B (hepatitis B vaccine).  You have HIV or AIDS.  You use needles to inject street drugs.  You live with, or have sex with, someone who has hepatitis B.  You are a man who has sex with other men (MSM).  You get hemodialysis treatment.  You take certain medicines for conditions such as cancer, organ transplantation, and autoimmune conditions.  Hepatitis C blood testing is recommended for all people born from [birth year] through 1965 and any individual with known risks for hepatitis C.  Practice safe sex. Use condoms and avoid high-risk sexual  practices to reduce the spread of sexually transmitted infections (STIs). STIs include gonorrhea, chlamydia, syphilis, trichomonas, herpes, HPV, and human immunodeficiency virus (HIV). Herpes, HIV, and HPV are viral illnesses that have no cure. They can result in disability, cancer, and death.  If you are a man who has sex with other men, you should be screened at least once per year for:  HIV.  Urethral, rectal, and pharyngeal infection of gonorrhea, chlamydia, or both.  If you are at risk of being infected with HIV, it is recommended that you take a prescription medicine daily to prevent HIV infection. This is called preexposure prophylaxis (PrEP). You are considered at risk if:  You are a man who has sex with other men (MSM) and have other risk factors.  You are a heterosexual man, are sexually active, and are at increased risk for HIV infection.  You take drugs by injection.  You are sexually active with a partner who has HIV.  Talk with your health care provider about whether you  are at high risk of being infected with HIV. If you choose to begin PrEP, you should first be tested for HIV. You should then be tested every 3 months for as long as you are taking PrEP.  A one-time screening for abdominal aortic aneurysm (AAA) and surgical repair of large AAAs by ultrasound are recommended for men ages 68 to 79 years who are current or former smokers.  Healthy men should no longer receive prostate-specific antigen (PSA) blood tests as part of routine cancer screening. Talk with your health care provider about prostate cancer screening.  Testicular cancer screening is not recommended for adult males who have no symptoms. Screening includes self-exam, a health care provider exam, and other screening tests. Consult with your health care provider about any symptoms you have or any concerns you have about testicular cancer.  Use sunscreen. Apply sunscreen liberally and repeatedly throughout the day. You should seek shade when your shadow is shorter than you. Protect yourself by wearing long sleeves, pants, a wide-brimmed hat, and sunglasses year round, whenever you are outdoors.  Once a month, do a whole-body skin exam, using a mirror to look at the skin on your back. Tell your health care provider about new moles, moles that have irregular borders, moles that are larger than a pencil eraser, or moles that have changed in shape or color.  Stay current with required vaccines (immunizations).  Influenza vaccine. All adults should be immunized every year.  Tetanus, diphtheria, and acellular pertussis (Td, Tdap) vaccine. An adult who has not previously received Tdap or who does not know his vaccine status should receive 1 dose of Tdap. This initial dose should be followed by tetanus and diphtheria toxoids (Td) booster doses every 10 years. Adults with an unknown or incomplete history of completing a 3-dose immunization series with Td-containing vaccines should begin or complete a primary  immunization series including a Tdap dose. Adults should receive a Td booster every 10 years.  Varicella vaccine. An adult without evidence of immunity to varicella should receive 2 doses or a second dose if he has previously received 1 dose.  Human papillomavirus (HPV) vaccine. Males aged 11-21 years who have not received the vaccine previously should receive the 3-dose series. Males aged 22-26 years may be immunized. Immunization is recommended through the age of 30 years for any male who has sex with males and did not get any or all doses earlier. Immunization is recommended for any person with an  immunocompromised condition through the age of 52 years if he did not get any or all doses earlier. During the 3-dose series, the second dose should be obtained 4-8 weeks after the first dose. The third dose should be obtained 24 weeks after the first dose and 16 weeks after the second dose.  Zoster vaccine. One dose is recommended for adults aged 38 years or older unless certain conditions are present.  Measles, mumps, and rubella (MMR) vaccine. Adults born before [birth year] generally are considered immune to measles and mumps. Adults born in [birth year] or later should have 1 or more doses of MMR vaccine unless there is a contraindication to the vaccine or there is laboratory evidence of immunity to each of the three diseases. A routine second dose of MMR vaccine should be obtained at least 28 days after the first dose for students attending postsecondary schools, health care workers, or international travelers. People who received inactivated measles vaccine or an unknown type of measles vaccine during 1963-1967 should receive 2 doses of MMR vaccine. People who received inactivated mumps vaccine or an unknown type of mumps vaccine before 1979 and are at high risk for mumps infection should consider immunization with 2 doses of MMR vaccine. Unvaccinated health care workers born before [birth year] who lack laboratory evidence of  measles, mumps, or rubella immunity or laboratory confirmation of disease should consider measles and mumps immunization with 2 doses of MMR vaccine or rubella immunization with 1 dose of MMR vaccine.  Pneumococcal 13-valent conjugate (PCV13) vaccine. When indicated, a person who is uncertain of his immunization history and has no record of immunization should receive the PCV13 vaccine. All adults 80 years of age and older should receive this vaccine. An adult aged 48 years or older who has certain medical conditions and has not been previously immunized should receive 1 dose of PCV13 vaccine. This PCV13 should be followed with a dose of pneumococcal polysaccharide (PPSV23) vaccine. Adults who are at high risk for pneumococcal disease should obtain the PPSV23 vaccine at least 8 weeks after the dose of PCV13 vaccine. Adults older than 80 years of age who have normal immune system function should obtain the PPSV23 vaccine dose at least 1 year after the dose of PCV13 vaccine.  Pneumococcal polysaccharide (PPSV23) vaccine. When PCV13 is also indicated, PCV13 should be obtained first. All adults aged 99 years and older should be immunized. An adult younger than age 20 years who has certain medical conditions should be immunized. Any person who resides in a nursing home or long-term care facility should be immunized. An adult smoker should be immunized. People with an immunocompromised condition and certain other conditions should receive both PCV13 and PPSV23 vaccines. People with human immunodeficiency virus (HIV) infection should be immunized as soon as possible after diagnosis. Immunization during chemotherapy or radiation therapy should be avoided. Routine use of PPSV23 vaccine is not recommended for American Indians, [place] Natives, or people younger than 65 years unless there are medical conditions that require PPSV23 vaccine. When indicated, people who have unknown immunization and have no record of  immunization should receive PPSV23 vaccine. One-time revaccination 5 years after the first dose of PPSV23 is recommended for people aged 19-64 years who have chronic kidney failure, nephrotic syndrome, asplenia, or immunocompromised conditions. People who received 1-2 doses of PPSV23 before age 74 years should receive another dose of PPSV23 vaccine at age 15 years or later if at least 5 years have passed since the previous dose. Doses of PPSV23 are  not needed for people immunized with PPSV23 at or after age 31 years.  Meningococcal vaccine. Adults with asplenia or persistent complement component deficiencies should receive 2 doses of quadrivalent meningococcal conjugate (MenACWY-D) vaccine. The doses should be obtained at least 2 months apart. Microbiologists working with certain meningococcal bacteria, [employer] recruits, people at risk during an outbreak, and people who travel to or live in countries with a high rate of meningitis should be immunized. A first-year college student up through age 64 years who is living in a residence hall should receive a dose if he did not receive a dose on or after his 16th birthday. Adults who have certain high-risk conditions should receive one or more doses of vaccine.  Hepatitis A vaccine. Adults who wish to be protected from this disease, have chronic liver disease, work with hepatitis A-infected animals, work in hepatitis A research labs, or travel to or work in countries with a high rate of hepatitis A should be immunized. Adults who were previously unvaccinated and who anticipate close contact with an international adoptee during the first 60 days after arrival in the [place] from a country with a high rate of hepatitis A should be immunized.  Hepatitis B vaccine. Adults should be immunized if they wish to be protected from this disease, are under age 39 years and have diabetes, have chronic liver disease, have had more than one sex partner in the past 6 months,  may be exposed to blood or other infectious body fluids, are household contacts or sex partners of hepatitis B positive people, are clients or workers in certain care facilities, or travel to or work in countries with a high rate of hepatitis B.  Haemophilus influenzae type b (Hib) vaccine. A previously unvaccinated person with asplenia or sickle cell disease or having a scheduled splenectomy should receive 1 dose of Hib vaccine. Regardless of previous immunization, a recipient of a hematopoietic stem cell transplant should receive a 3-dose series 6-12 months after his successful transplant. Hib vaccine is not recommended for adults with HIV infection. Preventive Service / Frequency Ages 62 to 20  Blood pressure check.** / Every 3-5 years.  Lipid and cholesterol check.** / Every 5 years beginning at age 47.  Hepatitis C blood test.** / For any individual with known risks for hepatitis C.  Skin self-exam. / Monthly.  Influenza vaccine. / Every year.  Tetanus, diphtheria, and acellular pertussis (Tdap, Td) vaccine.** / Consult your health care provider. 1 dose of Td every 10 years.  Varicella vaccine.** / Consult your health care provider.  HPV vaccine. / 3 doses over 6 months, if 10 or younger.  Measles, mumps, rubella (MMR) vaccine.** / You need at least 1 dose of MMR if you were born in 1957 or later. You may also need a second dose.  Pneumococcal 13-valent conjugate (PCV13) vaccine.** / Consult your health care provider.  Pneumococcal polysaccharide (PPSV23) vaccine.** / 1 to 2 doses if you smoke cigarettes or if you have certain conditions.  Meningococcal vaccine.** / 1 dose if you are age 14 to 28 years and a [employer] researcher living in a residence hall, or have one of several medical conditions. You may also need additional booster doses.  Hepatitis A vaccine.** / Consult your health care provider.  Hepatitis B vaccine.** / Consult your health care  provider.  Haemophilus influenzae type b (Hib) vaccine.** / Consult your health care provider. Ages 69 to 19  Blood pressure check.** / Every year.  Lipid and  cholesterol check.** / Every 5 years beginning at age 67.  Lung cancer screening. / Every year if you are aged 89-80 years and have a 30-pack-year history of smoking and currently smoke or have quit within the past 15 years. Yearly screening is stopped once you have quit smoking for at least 15 years or develop a health problem that would prevent you from having lung cancer treatment.  Fecal occult blood test (FOBT) of stool. / Every year beginning at age 56 and continuing until age 24. You may not have to do this test if you get a colonoscopy every 10 years.  Flexible sigmoidoscopy** or colonoscopy.** / Every 5 years for a flexible sigmoidoscopy or every 10 years for a colonoscopy beginning at age 54 and continuing until age 43.  Hepatitis C blood test.** / For all people born from [birth year] through 1965 and any individual with known risks for hepatitis C.  Skin self-exam. / Monthly.  Influenza vaccine. / Every year.  Tetanus, diphtheria, and acellular pertussis (Tdap/Td) vaccine.** / Consult your health care provider. 1 dose of Td every 10 years.  Varicella vaccine.** / Consult your health care provider.  Zoster vaccine.** / 1 dose for adults aged 42 years or older.  Measles, mumps, rubella (MMR) vaccine.** / You need at least 1 dose of MMR if you were born in 1955 or later. You may also need a second dose.  Pneumococcal 13-valent conjugate (PCV13) vaccine.** / Consult your health care provider.  Pneumococcal polysaccharide (PPSV23) vaccine.** / 1 to 2 doses if you smoke cigarettes or if you have certain conditions.  Meningococcal vaccine.** / Consult your health care provider.  Hepatitis A vaccine.** / Consult your health care provider.  Hepatitis B vaccine.** / Consult your health care provider.  Haemophilus influenzae  type b (Hib) vaccine.** / Consult your health care provider. Ages 51 and over  Blood pressure check.** / Every year.  Lipid and cholesterol check.**/ Every 5 years beginning at age 73.  Lung cancer screening. / Every year if you are aged 71-80 years and have a 30-pack-year history of smoking and currently smoke or have quit within the past 15 years. Yearly screening is stopped once you have quit smoking for at least 15 years or develop a health problem that would prevent you from having lung cancer treatment.  Fecal occult blood test (FOBT) of stool. / Every year beginning at age 76 and continuing until age 75. You may not have to do this test if you get a colonoscopy every 10 years.  Flexible sigmoidoscopy** or colonoscopy.** / Every 5 years for a flexible sigmoidoscopy or every 10 years for a colonoscopy beginning at age 77 and continuing until age 7.  Hepatitis C blood test.** / For all people born from [birth year] through 1963 and any individual with known risks for hepatitis C.  Abdominal aortic aneurysm (AAA) screening.** / A one-time screening for ages 70 to 30 years who are current or former smokers.  Skin self-exam. / Monthly.  Influenza vaccine. / Every year.  Tetanus, diphtheria, and acellular pertussis (Tdap/Td) vaccine.** / 1 dose of Td every 10 years.  Varicella vaccine.** / Consult your health care provider.  Zoster vaccine.** / 1 dose for adults aged 65 years or older.  Pneumococcal 13-valent conjugate (PCV13) vaccine.** / 1 dose for all adults aged 75 years and older.  Pneumococcal polysaccharide (PPSV23) vaccine.** / 1 dose for all adults aged 48 years and older.  Meningococcal vaccine.** / Consult your health care provider.  Hepatitis A vaccine.** / Consult your health care provider.  Hepatitis B vaccine.** / Consult your health care provider.  Haemophilus influenzae type b (Hib) vaccine.** / Consult your health care provider. **Family history and personal history  of risk and conditions may change your health care provider's recommendations.   This information is not intended to replace advice given to you by your health care provider. Make sure you discuss any questions you have with your health care provider.   Document Released: 03/11/2001 Document Revised: 02/03/2014 Document Reviewed: 06/10/2010 Elsevier Interactive Patient Education [employer].

## 2015-09-28 NOTE — Progress Notes (Signed)
Subjective:    Robert Pacheco is a 82 y.o. male who presents for Medicare Annual/Subsequent preventive examination.   Preventive Screening-Counseling & Management  Tobacco History  Smoking Status  . Former Smoker  Smokeless Tobacco  . Never Used   Chronic Issues: Hypertension -- Is currently on Toprol XL 50 mg daily. Is taking as directed but did not take today yet. Patient denies chest pain, palpitations, dizziness, vision changes or frequent headaches. Does sometimes notes lightheadedness on standing.   BP Readings from Last 3 Encounters:  09/28/15 138/70  04/27/15 118/64  11/02/14 (!) 152/90   Hyperlipidemia -- Is currently on statin therapy -- Zocor 20 mg daily. Is also taking 81 mg ASA daily. Is staying active around the house. Does more exercise in Spring and Fall as he notes Summer is too hot for him.   BPH -- Is taking Proscar 5 mg tablet daily as directed. Denies any urinary symptoms with this regimen.  Acute Concerns: Patient endorses fall from chair last week. Patient states he had gotten hot from working. Had not have anything to eat or drink that morning. Sat down to cool off and rest. States he then got up quickly and felt lightheaded, falling onto buttocks and hitting head on counter. Denies LOC. Since then has felt fine. Denies any residual symptoms. Denies AMS, vision changes, weakness. Denies pain.   Current Problems (verified) Patient Active Problem List   Diagnosis Date Noted  . Family history of colon cancer 10/01/2012  . Personal history of colonic adenomas 05/10/2012  . Lower back pain 05/27/2010  . BENIGN PROSTATIC HYPERTROPHY, WITH OBSTRUCTION 03/27/2009  . DYSPLASTIC NEVUS, [place] 10/05/2008  . CORONARY ATHEROSCLEROSIS OF ARTERY BYPASS GRAFT 03/02/2007  . Hyperlipidemia 02/22/2007  . Essential hypertension 02/22/2007    Medications Prior to Visit Current Outpatient Prescriptions on File Prior to Visit  Medication Sig Dispense Refill  . aspirin 81 MG  chewable tablet Chew 81 mg by mouth.    . finasteride (PROSCAR) 5 MG tablet Take 1 tablet (5 mg total) by mouth daily. 90 tablet 1  . fish oil-omega-3 fatty acids 1000 MG capsule Take 2 g by mouth daily.      [name] ketoconazole (NIZORAL) 2 % cream APPLY CREAM TOPICALLY TWICE DAILY 60 g 1  . metoprolol succinate (TOPROL-XL) 50 MG 24 hr tablet Take 1 tablet (50 mg total) by mouth daily. Take with or immediately following a meal. 30 tablet 5  . nitroGLYCERIN (NITROSTAT) 0.4 MG SL tablet Place 1 tablet (0.4 mg total) under the tongue every 5 (five) minutes as needed. 25 tablet 1  . Saw Palmetto 450 MG CAPS Take 1 tablet by mouth.    . simvastatin (ZOCOR) 20 MG tablet Take 1 tablet (20 mg total) by mouth at bedtime. 90 tablet 1   No current facility-administered medications on file prior to visit.     Current Medications (verified) Current Outpatient Prescriptions  Medication Sig Dispense Refill  . aspirin 81 MG chewable tablet Chew 81 mg by mouth.    . finasteride (PROSCAR) 5 MG tablet Take 1 tablet (5 mg total) by mouth daily. 90 tablet 1  . fish oil-omega-3 fatty acids 1000 MG capsule Take 2 g by mouth daily.      [name] ketoconazole (NIZORAL) 2 % cream APPLY CREAM TOPICALLY TWICE DAILY 60 g 1  . metoprolol succinate (TOPROL-XL) 50 MG 24 hr tablet Take 1 tablet (50 mg total) by mouth daily. Take with or immediately following a meal. 30 tablet  5  . nitroGLYCERIN (NITROSTAT) 0.4 MG SL tablet Place 1 tablet (0.4 mg total) under the tongue every 5 (five) minutes as needed. 25 tablet 1  . Saw Palmetto 450 MG CAPS Take 1 tablet by mouth.    . simvastatin (ZOCOR) 20 MG tablet Take 1 tablet (20 mg total) by mouth at bedtime. 90 tablet 1   No current facility-administered medications for this visit.      Allergies (verified) Other   PAST HISTORY  Family History Family History  Problem Relation Age of Onset  . Heart disease Father   . Colon cancer Brother     2 brothers  . Healthy Child     x 4  .  Healthy Grandchild     x 11    Social History Social History  Substance Use Topics  . Smoking status: Former Research scientist (life sciences)  . Smokeless tobacco: Never Used  . Alcohol use No    Are there smokers in your home (other than you)?  No  Risk Factors Current exercise habits: Home exercise routine includes walking 1 hrs per day.  Dietary issues discussed: Well-balanced.   Cardiac risk factors: advanced age (older than 32 for men, 21 for women), dyslipidemia, hypertension, male gender and obesity (BMI >= 30 kg/m2).  Depression Screen (Note: if answer to either of the following is "Yes", a more complete depression screening is indicated)   Q1: Over the past two weeks, have you felt down, depressed or hopeless? No  Q2: Over the past two weeks, have you felt little interest or pleasure in doing things? No  Have you lost interest or pleasure in daily life? No  Do you often feel hopeless? No  Do you cry easily over simple problems? No  Activities of Daily Living In your present state of health, do you have any difficulty performing the following activities?:  Driving? No Managing money?  No Feeding yourself? No Getting from bed to chair? No Climbing a flight of stairs? No Preparing food and eating?: No Bathing or showering? No Getting dressed: No Getting to the toilet? No Using the toilet:No Moving around from place to place: No In the past year have you fallen or had a near fall?:Yes - See HPI above.   Are you sexually active?  Yes  Do you have more than one partner?  No  Hearing Difficulties: No Do you often ask people to speak up or repeat themselves? No Do you experience ringing or noises in your ears? No Do you have difficulty understanding soft or whispered voices? No   Do you feel that you have a problem with memory? No  Do you often misplace items? No  Do you feel safe at home?  Yes  Cognitive Testing  Alert? Yes  Normal Appearance?Yes  Oriented to person? Yes  Place?  Yes   Time? Yes  Recall of three objects?  Yes  Can perform simple calculations? Yes  Displays appropriate judgment?Yes   Advanced Directives have been discussed with the patient? Yes   List the Names of Other Physician/Practitioners you currently use: See EMR for comprehensive list  Indicate any recent Medical Services you may have received from other than [facility] providers in the past year (date may be approximate).  Immunization History  Administered Date(s) Administered  . Influenza Split 12/31/2010, 01/14/2012  . Influenza Whole 12/03/2006, 11/10/2007, 10/31/2008, 12/10/2009  . Influenza, High Dose Seasonal PF 12/15/2012, 12/13/2013  . Pneumococcal Conjugate-13 07/28/2013  . Pneumococcal Polysaccharide-23 01/27/2001, 03/02/2007  . Td  01/28/2004  . Tdap 09/11/2014    Screening Tests Health Maintenance  Topic Date Due  . ZOSTAVAX  05/12/1995  . COLONOSCOPY  09/24/2015  . INFLUENZA VACCINE  04/26/2016 (Originally 08/28/2015)  . DTaP/Tdap/Td (2 - Td) 09/10/2024  . TETANUS/TDAP  09/10/2024  . PNA vac Low Risk Adult  Completed    All answers were reviewed with the patient and necessary referrals were made:  Leeanne Rio, PA-C   09/28/2015   History reviewed: allergies, current medications, past family history, past medical history, past social history, past surgical history and problem list  Review of Systems Pertinent items noted in HPI and remainder of comprehensive ROS otherwise negative.    Objective:     Blood pressure 138/70, pulse (!) 55, temperature 98 F (36.7 C), temperature source Oral, resp. rate 14, height 5\' 7"  (1.702 m), weight 183 lb 3.2 oz (83.1 kg), SpO2 98 %. Body mass index is 28.69 kg/m.   General appearance: alert, cooperative, appears stated age and no distress Head: Normocephalic, without obvious abnormality, atraumatic Ears: normal TM's and external ear canals both ears Nose: Nares normal. Septum midline. Mucosa normal. No drainage or  sinus tenderness. Throat: lips, mucosa, and tongue normal; teeth and gums normal Lungs: clear to auscultation bilaterally Heart: regular rate and rhythm, S1, S2 normal, no murmur, click, rub or gallop Abdomen: soft, non-tender; bowel sounds normal; no masses,  no organomegaly Extremities: extremities normal, atraumatic, no cyanosis or edema Pulses: 2+ and symmetric Neurologic: Alert and oriented X 3, normal strength and tone. Normal symmetric reflexes. Normal coordination and gait     Assessment:     (1) Medicare Wellness, Subsequent (2) Hypertension (3) Hyperlipidemia (4) BPH (5) Fall at home     Plan:     (1) During the course of the visit the patient was educated and counseled about appropriate screening and preventive services including:    Colorectal cancer screening  Diabetes screening  Nutrition counseling   Advanced directives: has an advanced directive - a copy HAS NOT been provided.  (2) BP stable but patient with episodes of orthostatic lightheadedness. Will attempt trial of decreased Toprol XL 50 mg to 25 mg. DASH diet discussed. FU scheduled for nurse visit for BP recheck. He is to check BP at home. Goal is < 150/90.   (3) Will repeat lipid panel today.  (4) Doing well. Continue current regimen.   (5) No LOC. After episode of orthostatic lightheadedness. BP regimen is being altered. Exam within normal limits. No residual symptoms. Diet and hydration reviewed with patient.     Patient Instructions (the written plan) was given to the patient.  Medicare Attestation I have personally reviewed: The patient's medical and social history Their use of alcohol, tobacco or illicit drugs Their current medications and supplements The patient's functional ability including ADLs,fall risks, home safety risks, cognitive, and hearing and visual impairment Diet and physical activities Evidence for depression or mood disorders  The patient's weight, height, BMI, and  visual acuity have been recorded in the chart.  I have made referrals, counseling, and provided education to the patient based on review of the above and I have provided the patient with a written personalized care plan for preventive services.     Raiford Noble [place], [place]   09/28/2015

## 2015-09-28 NOTE — Progress Notes (Signed)
Pre visit review using our clinic review tool, if applicable. No additional management support is needed unless otherwise documented below in the visit note. 

## 2015-10-08 ENCOUNTER — Encounter: Payer: Self-pay | Admitting: Internal Medicine

## 2015-10-10 ENCOUNTER — Encounter: Payer: Self-pay | Admitting: Internal Medicine

## 2015-10-12 ENCOUNTER — Ambulatory Visit (INDEPENDENT_AMBULATORY_CARE_PROVIDER_SITE_OTHER): Payer: Medicare Other | Admitting: Physician Assistant

## 2015-10-12 ENCOUNTER — Encounter: Payer: Self-pay | Admitting: Physician Assistant

## 2015-10-12 DIAGNOSIS — N400 Enlarged prostate without lower urinary tract symptoms: Secondary | ICD-10-CM | POA: Diagnosis not present

## 2015-10-12 DIAGNOSIS — E785 Hyperlipidemia, unspecified: Secondary | ICD-10-CM

## 2015-10-12 MED ORDER — SIMVASTATIN 20 MG PO TABS: 20.0000 mg | ORAL_TABLET | Freq: Every day | ORAL | 1 refills | Status: DC

## 2015-10-12 MED ORDER — FINASTERIDE 5 MG PO TABS: 5.0000 mg | ORAL_TABLET | Freq: Every day | ORAL | 1 refills | Status: DC

## 2015-10-12 MED ORDER — SAW PALMETTO 450 MG PO CAPS: 900.0000 mg | ORAL_CAPSULE | Freq: Every day | ORAL | Status: DC

## 2015-10-14 NOTE — Progress Notes (Signed)
Patient presented today for BP check with the nurse.   BP Readings from Last 3 Encounters:  10/12/15 122/76  09/28/15 138/70  04/27/15 118/64   Patient tolerating lower dose of medications without symptoms. BP stable. Continue current regimen. FU scheduled.  Leeanne Rio, PA-C

## 2015-10-15 ENCOUNTER — Ambulatory Visit (AMBULATORY_SURGERY_CENTER): Payer: Self-pay | Admitting: *Deleted

## 2015-10-15 VITALS — Ht 67.5 in | Wt 181.0 lb

## 2015-10-15 DIAGNOSIS — Z800 Family history of malignant neoplasm of digestive organs: Secondary | ICD-10-CM

## 2015-10-15 DIAGNOSIS — Z86010 Personal history of colonic polyps: Secondary | ICD-10-CM

## 2015-10-15 NOTE — Progress Notes (Signed)
Patient denies any allergies to eggs or soy. Patient denies any problems with anesthesia/sedation. Patient denies any oxygen use at home and does not take any diet/weight loss medications.  

## 2015-11-01 ENCOUNTER — Ambulatory Visit (AMBULATORY_SURGERY_CENTER): Payer: Medicare Other | Admitting: Internal Medicine

## 2015-11-01 ENCOUNTER — Encounter: Payer: Self-pay | Admitting: Internal Medicine

## 2015-11-01 VITALS — BP 136/80 | HR 72 | Temp 99.1°F | Resp 18 | Ht 67.0 in | Wt 181.0 lb

## 2015-11-01 DIAGNOSIS — D123 Benign neoplasm of transverse colon: Secondary | ICD-10-CM | POA: Diagnosis not present

## 2015-11-01 DIAGNOSIS — Z86010 Personal history of colonic polyps: Secondary | ICD-10-CM

## 2015-11-01 MED ORDER — SODIUM CHLORIDE 0.9 % IV SOLN: 500.0000 mL | INTRAVENOUS | Status: DC

## 2015-11-01 NOTE — Progress Notes (Signed)
Called to room to assist during endoscopic procedure.  Patient ID and intended procedure confirmed with present staff. Received instructions for my participation in the procedure from the performing physician.  

## 2015-11-01 NOTE — Progress Notes (Signed)
Report to PACU, RN, vss, BBS= Clear.  

## 2015-11-01 NOTE — Patient Instructions (Addendum)
   I found and removed 4 small polyps - all look benign. I do not plan to recommend any more routine colonoscopy testing.  I appreciate the opportunity to care for you. Gatha Mayer, MD, FACG   YOU HAD AN ENDOSCOPIC PROCEDURE TODAY AT [facility]:   Refer to the procedure report that was given to you for any specific questions about what was found during the examination.  If the procedure report does not answer your questions, please call your gastroenterologist to clarify.  If you requested that your care partner not be given the details of your procedure findings, then the procedure report has been included in a sealed envelope for you to review at your convenience later.  YOU SHOULD EXPECT: Some feelings of bloating in the abdomen. Passage of more gas than usual.  Walking can help get rid of the air that was put into your GI tract during the procedure and reduce the bloating. If you had a lower endoscopy (such as a colonoscopy or flexible sigmoidoscopy) you may notice spotting of blood in your stool or on the toilet paper. If you underwent a bowel prep for your procedure, you may not have a normal bowel movement for a few days.  Please Note:  You might notice some irritation and congestion in your nose or some drainage.  This is from the oxygen used during your procedure.  There is no need for concern and it should clear up in a day or so.  SYMPTOMS TO REPORT IMMEDIATELY:   Following lower endoscopy (colonoscopy or flexible sigmoidoscopy):  Excessive amounts of blood in the stool  Significant tenderness or worsening of abdominal pains  Swelling of the abdomen that is new, acute  Fever of 100F or higher  For urgent or emergent issues, a gastroenterologist can be reached at any hour by calling [phone].  DIET:  We do recommend a small meal at first, but then you may proceed to your regular diet.  Drink plenty of fluids but you should avoid alcoholic beverages  for 24 hours.  ACTIVITY:  You should plan to take it easy for the rest of today and you should NOT DRIVE or use heavy machinery until tomorrow (because of the sedation medicines used during the test).    FOLLOW UP: Our staff will call the number listed on your records the next business day following your procedure to check on you and address any questions or concerns that you may have regarding the information given to you following your procedure. If we do not reach you, we will leave a message.  However, if you are feeling well and you are not experiencing any problems, there is no need to return our call.  We will assume that you have returned to your regular daily activities without incident.  If any biopsies were taken you will be contacted by phone or by letter within the next 1-3 weeks.  Please call us at [phone] if you have not heard about the biopsies in 3 weeks.   SIGNATURES/CONFIDENTIALITY: You and/or your care partner have signed paperwork which will be entered into your electronic medical record.  These signatures attest to the fact that that the information above on your After Visit Summary has been reviewed and is understood.  Full responsibility of the confidentiality of this discharge information lies with you and/or your care-partner.  Please read over handout about polyps  Please continue your normal medications

## 2015-11-01 NOTE — Op Note (Signed)
[place] Patient Name: Robert Pacheco Procedure Date: 11/01/2015 2:46 PM MRN: [id number] Endoscopist: Gatha Mayer , MD Age: 82 Referring MD:  Date of Birth: 1933/07/30 Gender: Male Account #: [id number] Procedure:                Colonoscopy Indications:              Surveillance: Personal history of adenomatous                            polyps on last colonoscopy 3 years ago Medicines:                Propofol per Anesthesia, Monitored Anesthesia Care Procedure:                Pre-Anesthesia Assessment:                           - Prior to the procedure, a History and Physical                            was performed, and patient medications and                            allergies were reviewed. The patient's tolerance of                            previous anesthesia was also reviewed. The risks                            and benefits of the procedure and the sedation                            options and risks were discussed with the patient.                            All questions were answered, and informed consent                            was obtained. Prior Anticoagulants: The patient                            last took aspirin 1 day prior to the procedure. ASA                            Grade Assessment: III - A patient with severe                            systemic disease. After reviewing the risks and                            benefits, the patient was deemed in satisfactory                            condition to undergo the procedure.  After obtaining informed consent, the colonoscope                            was passed under direct vision. Throughout the                            procedure, the patient's blood pressure, pulse, and                            oxygen saturations were monitored continuously. The                            Model CF-HQ190L [id number]) scope was introduced                            through the  anus and advanced to the the cecum,                            identified by appendiceal orifice and ileocecal                            valve. The colonoscopy was performed without                            difficulty. The ileocecal valve, appendiceal                            orifice, and rectum were photographed. The bowel                            preparation used was Miralax. The quality of the                            bowel preparation was good. Scope In: 2:55:07 PM Scope Out: 3:17:34 PM Scope Withdrawal Time: 0 hours 16 minutes 2 seconds  Total Procedure Duration: 0 hours 22 minutes 27 seconds  Findings:                 The perianal examination was normal.                           The digital rectal exam findings include enlarged                            prostate. Pertinent negatives include no palpable                            rectal lesions.                           Four sessile polyps were found in the transverse                            colon. The polyps were 4 to 6 mm in size. These  polyps were removed with a cold snare. Resection                            and retrieval were complete. Verification of                            patient identification for the specimen was done.                            Estimated blood loss was minimal.                           The exam was otherwise without abnormality on                            direct and retroflexion views. Complications:            No immediate complications. Estimated Blood Loss:     Estimated blood loss was minimal. Impression:               - Enlarged prostate found on digital rectal exam.                           - Four 4 to 6 mm polyps in the transverse colon,                            removed with a cold snare. Resected and retrieved.                           - The examination was otherwise normal on direct                            and retroflexion  views. Recommendation:           - Patient has a contact number available for                            emergencies. The signs and symptoms of potential                            delayed complications were discussed with the                            patient. Return to normal activities tomorrow.                            Written discharge instructions were provided to the                            patient.                           - Resume previous diet.                           - Continue present medications.                           -  No repeat colonoscopy due to age. Gatha Mayer, MD 11/01/2015 3:22:50 PM This report has been signed electronically.

## 2015-11-02 ENCOUNTER — Telehealth: Payer: Self-pay

## 2015-11-02 ENCOUNTER — Telehealth: Payer: Self-pay | Admitting: Internal Medicine

## 2015-11-02 NOTE — Telephone Encounter (Signed)
The pt was advised that the section of the report he is speaking of is a general statement that is on everyone's paperwork and does not concern his health.  Only those that are affected by it are on blood thinners.  He will call with any further questions

## 2015-11-02 NOTE — Telephone Encounter (Signed)
  Follow up Call-  Call back number 11/01/2015  Post procedure Call Back phone  # [phone]  Permission to leave phone message Yes  Some recent data might be hidden     Patient questions:  Do you have a fever, pain , or abdominal swelling? No. Pain Score  0 *  Have you tolerated food without any problems? Yes.    Have you been able to return to your normal activities? Yes.    Do you have any questions about your discharge instructions: Diet   No. Medications  No. Follow up visit  No.  Do you have questions or concerns about your Care? No.  Actions: * If pain score is 4 or above: No action needed, pain <4.

## 2015-11-09 ENCOUNTER — Encounter: Payer: Self-pay | Admitting: Internal Medicine

## 2015-11-09 DIAGNOSIS — Z86010 Personal history of colonic polyps: Secondary | ICD-10-CM

## 2015-11-09 NOTE — Progress Notes (Signed)
4 adenomas - small No recall age

## 2015-11-26 ENCOUNTER — Telehealth: Payer: Self-pay | Admitting: Physician Assistant

## 2015-11-26 NOTE — Telephone Encounter (Signed)
Patient request to transfer care from [place] to Dr. Wendling °

## 2015-11-26 NOTE — Telephone Encounter (Signed)
OK with me. TY.

## 2015-11-26 NOTE — Telephone Encounter (Signed)
Ok with me 

## 2015-12-18 ENCOUNTER — Encounter: Payer: Self-pay | Admitting: Physician Assistant

## 2015-12-19 ENCOUNTER — Ambulatory Visit (INDEPENDENT_AMBULATORY_CARE_PROVIDER_SITE_OTHER): Payer: Medicare Other | Admitting: Medical

## 2015-12-19 ENCOUNTER — Encounter: Payer: Self-pay | Admitting: Medical

## 2015-12-19 VITALS — BP 122/82 | HR 68 | Temp 97.8°F | Resp 16 | Ht 67.0 in | Wt 181.0 lb

## 2015-12-19 DIAGNOSIS — J209 Acute bronchitis, unspecified: Secondary | ICD-10-CM | POA: Diagnosis not present

## 2015-12-19 DIAGNOSIS — J029 Acute pharyngitis, unspecified: Secondary | ICD-10-CM | POA: Diagnosis not present

## 2015-12-19 LAB — POCT RAPID STREP A (OFFICE): Rapid Strep A Screen: NEGATIVE

## 2015-12-19 MED ORDER — BENZONATATE 100 MG PO CAPS: 100.0000 mg | ORAL_CAPSULE | Freq: Three times a day (TID) | ORAL | 0 refills | Status: DC | PRN

## 2015-12-19 MED ORDER — FLUTICASONE PROPIONATE 50 MCG/ACT NA SUSP: 2.0000 | Freq: Every day | NASAL | 1 refills | Status: DC

## 2015-12-19 MED ORDER — AZITHROMYCIN 250 MG PO TABS: ORAL_TABLET | ORAL | 0 refills | Status: DC

## 2015-12-19 NOTE — Progress Notes (Signed)
Pre visit review using our clinic review tool, if applicable. No additional management support is needed unless otherwise documented below in the visit note/SLS  

## 2015-12-19 NOTE — Telephone Encounter (Signed)
error:[id number] ° °

## 2015-12-19 NOTE — Progress Notes (Signed)
Subjective:    Patient ID: Robert Pacheco, male    DOB: [birth year], 82 y.o.   MRN: [id number]  HPI  Cough,(nasal and chest congestion), runny nose, and pnd for 2 wks. But states just now has  productive cough for 1 wk.   No fever, no chills or sweats. No Body aches.   Pt describes very faint st on coughing mostly.   Review of Systems  Constitutional: Negative for chills, fatigue and fever.  HENT: Positive for congestion, postnasal drip, rhinorrhea and sore throat. Negative for sinus pain and sinus pressure.        Faint st.  Respiratory: Positive for cough. Negative for chest tightness, shortness of breath and wheezing.        Productive.  Cardiovascular: Negative for chest pain and palpitations.  Gastrointestinal: Negative for abdominal pain and anal bleeding.  Musculoskeletal: Negative for back pain, joint swelling and myalgias.  Hematological: Negative for adenopathy. Does not bruise/bleed easily.  Psychiatric/Behavioral: Negative for behavioral problems and confusion.    Past Medical History:  Diagnosis Date  . Allergy   . Hyperlipidemia   . Hypertension   . Personal history of colonic adenomas 05/10/2012  . Skin cancer, basal cell      Social History   Social History  . Marital status: Married    Spouse name: N/A  . Number of children: 4  . Years of education: N/A   Occupational History  .      [employer]   Social History Main Topics  . Smoking status: Former Research scientist (life sciences)  . Smokeless tobacco: Never Used  . Alcohol use No  . Drug use: No  . Sexual activity: Not on file   Other Topics Concern  . Not on file   Social History Narrative  . No narrative on file    Past Surgical History:  Procedure Laterality Date  . CATARACT EXTRACTION    . COLONOSCOPY  multiple  . CORONARY ARTERY BYPASS GRAFT  2000  . EYE SURGERY    . HERNIA REPAIR    . MOHS SURGERY      Family History  Problem Relation Age of Onset  . Heart disease Father   . Colon cancer  Brother 82  . Healthy Child     x 4  . Healthy Grandchild     x 11  . Colon cancer Sister 26  . Prostate cancer Brother     Allergies  Allergen Reactions  . Other Nausea Only    UNKNOWN PAIN MED    Current Outpatient Prescriptions on File Prior to Visit  Medication Sig Dispense Refill  . aspirin 81 MG chewable tablet Chew 81 mg by mouth.    . finasteride (PROSCAR) 5 MG tablet Take 1 tablet (5 mg total) by mouth daily. 90 tablet 1  . fish oil-omega-3 fatty acids 1000 MG capsule Take 2 g by mouth daily.      [name] ketoconazole (NIZORAL) 2 % cream APPLY CREAM TOPICALLY TWICE DAILY (Patient taking differently: 2 (two) times daily as needed. APPLY CREAM TOPICALLY TWICE DAILY) 60 g 1  . metoprolol succinate (TOPROL-XL) 50 MG 24 hr tablet Take 1 tablet (50 mg total) by mouth daily. Take with or immediately following a meal. (Patient taking differently: Take 25 mg by mouth daily. Take with or immediately following a meal.) 30 tablet 5  . nitroGLYCERIN (NITROSTAT) 0.4 MG SL tablet Place 1 tablet (0.4 mg total) under the tongue every 5 (five) minutes as needed. 25  tablet 1  . Saw Palmetto 450 MG CAPS Take 2 capsules (900 mg total) by mouth daily.    . simvastatin (ZOCOR) 20 MG tablet Take 1 tablet (20 mg total) by mouth at bedtime. 90 tablet 1   Current Facility-Administered Medications on File Prior to Visit  Medication Dose Route Frequency Provider Last Rate Last Dose  . 0.9 %  sodium chloride infusion  500 mL Intravenous Continuous Gatha Mayer, MD        BP 122/82 (BP Location: Left Arm, Patient Position: Sitting, Cuff Size: Large)   Pulse 68   Temp 97.8 F (36.6 C) (Oral)   Resp 16   Ht 5\' 7"  (1.702 m)   Wt 181 lb (82.1 kg)   SpO2 98%   BMI 28.35 kg/m       Objective:   Physical Exam   General  Mental Status - Alert. General Appearance - Well groomed. Not in acute distress.  Skin Rashes- No Rashes.  HEENT Head- Normal. Ear Auditory Canal - Left- Normal. Right -  Normal.Tympanic Membrane- Left- Normal. Right- Normal. Eye Sclera/Conjunctiva- Left- Normal. Right- Normal. Nose & Sinuses Nasal Mucosa- Left-  Boggy and Congested. Right-  Boggy and  Congested.Bilateral  No maxillary and  No frontal sinus pressure. Mouth & Throat Lips: Upper Lip- Normal: no dryness, cracking, pallor, cyanosis, or vesicular eruption. Lower Lip-Normal: no dryness, cracking, pallor, cyanosis or vesicular eruption. Buccal Mucosa- Bilateral- No Aphthous ulcers. Oropharynx- No Discharge or Erythema. +pnd Tonsils: Characteristics- Bilateral- No Erythema or Congestion. Size/Enlargement- Bilateral- No enlargement. Discharge- bilateral-None.  Neck Neck- Supple. No Masses.   Chest and Lung Exam Auscultation: Breath Sounds:- even and unlabored.  Cardiovascular Auscultation:Rythm- Regular, rate and rhythm. Murmurs & Other Heart Sounds:Ausculatation of the heart reveal- No Murmurs.  Lymphatic Head & Neck General Head & Neck Lymphatics: Bilateral: Description- No Localized lymphadenopathy.      Assessment & Plan:  You appear to have bronchitis. Rest hydrate and tylenol for fever. I am prescribing cough medicine benzonatate and azithromycin  antibiotic. For your nasal congestion flonase nasal steroid.   Azithromycin antibiotic has coverage of most bacteria that can infect throat as well.  You should gradually get better. If not then notify us and would recommend a chest xray.  Follow up in 7-10 days or as needed  Gaylin Bulthuis, Percell Miller, [employer]

## 2015-12-19 NOTE — Patient Instructions (Addendum)
You appear to have bronchitis. Rest hydrate and tylenol for fever. I am prescribing cough medicine benzonatate and azithromycin  antibiotic. For your nasal congestion flonase nasal steroid.   Azithromycin antibiotic has coverage of most bacteria that can infect throat as well.  You should gradually get better. If not then notify us and would recommend a chest xray.  Follow up in 7-10 days or as needed

## 2015-12-24 ENCOUNTER — Telehealth: Payer: Self-pay | Admitting: Physician Assistant

## 2015-12-24 DIAGNOSIS — N400 Enlarged prostate without lower urinary tract symptoms: Secondary | ICD-10-CM

## 2015-12-24 DIAGNOSIS — E785 Hyperlipidemia, unspecified: Secondary | ICD-10-CM

## 2015-12-24 MED ORDER — FINASTERIDE 5 MG PO TABS: 5.0000 mg | ORAL_TABLET | Freq: Every day | ORAL | 0 refills | Status: DC

## 2015-12-24 MED ORDER — SIMVASTATIN 20 MG PO TABS: 20.0000 mg | ORAL_TABLET | Freq: Every day | ORAL | 0 refills | Status: DC

## 2015-12-24 NOTE — Telephone Encounter (Signed)
lvm advising patient of message below °

## 2015-12-24 NOTE — Telephone Encounter (Signed)
°  Relation to [id number] Call back number:[phone] Pharmacy: [place], [place] [phone] (Fax)     Reason for call:  Patient requesting a refill finasteride (PROSCAR) 5 MG tablet and simvastatin (ZOCOR) 20 MG tablet

## 2015-12-24 NOTE — Telephone Encounter (Signed)
Refills sent. 90-day supplies given.

## 2015-12-27 ENCOUNTER — Encounter: Payer: Self-pay | Admitting: Family Medicine

## 2015-12-27 ENCOUNTER — Ambulatory Visit (INDEPENDENT_AMBULATORY_CARE_PROVIDER_SITE_OTHER): Payer: Medicare Other | Admitting: Family Medicine

## 2015-12-27 VITALS — BP 118/84 | HR 67 | Temp 98.1°F | Ht 67.0 in | Wt 181.0 lb

## 2015-12-27 DIAGNOSIS — J209 Acute bronchitis, unspecified: Secondary | ICD-10-CM

## 2015-12-27 MED ORDER — BENZONATATE 100 MG PO CAPS: 100.0000 mg | ORAL_CAPSULE | Freq: Three times a day (TID) | ORAL | 0 refills | Status: DC | PRN

## 2015-12-27 NOTE — Progress Notes (Signed)
Pre visit review using our clinic review tool, if applicable. No additional management support is needed unless otherwise documented below in the visit note. 

## 2015-12-27 NOTE — Patient Instructions (Signed)
Claritin (loratadine), Allegra (fexofenadine), Zyrtec (cetirizine); these are listed in order from weakest to strongest. Generic, and therefore cheaper, options are in the parentheses.   There are available OTC, and the generic versions, which may be cheaper, are in parentheses. Show this to a pharmacist if you have trouble finding any of these items.

## 2015-12-27 NOTE — Progress Notes (Signed)
Chief Complaint  Patient presents with  . Establish Care    Robert Pacheco here for URI complaints.  Duration: 3 weeks  Associated symptoms: sinus congestion, rhinorrhea and cough Denies: sinus pain, itchy watery eyes, sore throat, shortness of breath and fever Treatment to date: fluids and rest, Zpak; doing a little better but not 100%. Sick contacts: No  ROS:  Const: Denies fevers HEENT: As noted in HPI Lungs: No SOB  Past Medical History:  Diagnosis Date  . Allergy   . Hyperlipidemia   . Hypertension   . Personal history of colonic adenomas 05/10/2012  . Skin cancer, basal cell    Family History  Problem Relation Age of Onset  . Heart disease Father   . Colon cancer Brother 59  . Healthy Child     x 4  . Healthy Grandchild     x 11  . Colon cancer Sister 78  . Prostate cancer Brother     BP 118/84 (BP Location: Left Arm, Patient Position: Sitting, Cuff Size: Normal)   Pulse 67   Temp 98.1 F (36.7 C) (Oral)   Ht 5\' 7"  (1.702 m)   Wt 181 lb (82.1 kg)   BMI 28.35 kg/m  General: Awake, alert, appears stated age HEENT: AT, [place], ears patent b/l and TM's neg, nares patent w/o discharge, pharynx pink and without exudates, MMM Neck: No masses or asymmetry Heart: RRR, no murmurs, no bruits Lungs: CTAB, no accessory muscle use Psych: Age appropriate judgment and insight, normal mood and affect  Acute bronchitis, unspecified organism - Plan: benzonatate (TESSALON) 100 MG capsule  Orders as above. Likely viral in etiology. Discussed post-infectious cough. Recommended PO antihistamine for supportive care as well. F/u 3 mo for BP check. Pt voiced understanding and agreement to the plan.  [place], DO 12/27/15 10:42 AM

## 2016-01-16 ENCOUNTER — Telehealth: Payer: Self-pay | Admitting: Physician Assistant

## 2016-01-16 DIAGNOSIS — J209 Acute bronchitis, unspecified: Secondary | ICD-10-CM

## 2016-01-16 NOTE — Telephone Encounter (Signed)
Relation to [id number] Call back number:[phone] Pharmacy: [place], [place] [phone] (Fax)     Reason for call:  Patient requesting a refill benzonatate (TESSALON) 100 MG capsule, please advise

## 2016-01-17 MED ORDER — BENZONATATE 100 MG PO CAPS: 100.0000 mg | ORAL_CAPSULE | Freq: Three times a day (TID) | ORAL | 0 refills | Status: DC | PRN

## 2016-01-17 NOTE — Telephone Encounter (Signed)
OK for refill. Let him know if he is still having issues after this refill is completed, I would like to re-evaluate him. TY.

## 2016-01-17 NOTE — Addendum Note (Signed)
Addended by: Sherman Cellar on: 01/17/2016 05:12 PM   Modules accepted: Orders

## 2016-01-17 NOTE — Telephone Encounter (Signed)
Please advise for recommendation. Should patient come back in for follow up?

## 2016-01-17 NOTE — Telephone Encounter (Signed)
Per Dr. Nani Ravens recommendation ok to refill Rx. #21 capsule #0 refill TL/CMA

## 2016-03-12 ENCOUNTER — Encounter: Payer: Self-pay | Admitting: Family Medicine

## 2016-03-12 ENCOUNTER — Ambulatory Visit (INDEPENDENT_AMBULATORY_CARE_PROVIDER_SITE_OTHER): Payer: Medicare Other | Admitting: Family Medicine

## 2016-03-12 VITALS — BP 110/60 | HR 73 | Temp 97.8°F | Ht 67.0 in | Wt 183.0 lb

## 2016-03-12 DIAGNOSIS — L309 Dermatitis, unspecified: Secondary | ICD-10-CM

## 2016-03-12 DIAGNOSIS — R51 Headache: Secondary | ICD-10-CM | POA: Diagnosis not present

## 2016-03-12 DIAGNOSIS — R519 Headache, unspecified: Secondary | ICD-10-CM

## 2016-03-12 DIAGNOSIS — J302 Other seasonal allergic rhinitis: Secondary | ICD-10-CM

## 2016-03-12 MED ORDER — FLUTICASONE PROPIONATE 50 MCG/ACT NA SUSP: 2.0000 | Freq: Every day | NASAL | 1 refills | Status: DC

## 2016-03-12 NOTE — Progress Notes (Signed)
Chief Complaint  Patient presents with  . Headache    x 3-4 weeks-pt states it's getting worse    Robert Pacheco is a 83 y.o. male here for evaluation of a b/l frontal headache.  Duration: 4 weeks Palliation: Ibuprofen (200 mg at a time), unknown if anything else Provocation: unknown Quality: dull Associated symptoms: runny nose (+hx of allergies) Denies: nausea, vomiting, sonophobia, photophobia, scotomata, photopsia, vertigo, ataxia, neck stiffness, lacrimation Currently with headache? No Failed therapies: none  Skin: Has an itchy and scaly area on the back of his L lower leg. He used some left over Nizoral cream from a "jock itch" infection. Unsure if this was contributing to headaches so he stopped taking it. No new exposures/topoicals. No fevers or recent travel.   Past Medical History:  Diagnosis Date  . Allergy   . Hyperlipidemia   . Hypertension   . Personal history of colonic adenomas 05/10/2012  . Skin cancer, basal cell     BP 110/60 (BP Location: Left Arm, Patient Position: Sitting, Cuff Size: Normal)   Pulse 73   Temp 97.8 F (36.6 C) (Oral)   Ht 5\' 7"  (1.702 m)   Wt 183 lb (83 kg)   SpO2 98%   BMI 28.66 kg/m  General: awake, alert, appearing stated age Eyes: PERRLA, EOMi Heart: RRR, no murmurs, no bruits Lungs: CTAB, no accessory muscle use Neuro: CN 2-12 intact, no cerebellar signs, DTR's equal and symmetry, no clonus MSK: 5/5 strength throughout, normal gait, no TTP over posterior cervical triangle or paraspinal cervical musculature, no TTP over TMJ or jaw clicking, no TTP over sinuses Skin: Scaly and excoriated lesion on posterior distal L leg. No drainage or fluid filled lesions. No warmth, no TTP. Psych: Age appropriate judgment and insight, mood and affect normal  Nonintractable headache, unspecified chronicity pattern, unspecified headache type  Eczema, unspecified type  Seasonal allergic rhinitis, unspecified chronicity, unspecified trigger -  Plan: fluticasone (FLONASE) 50 MCG/ACT nasal spray  Orders as above. Lotions twice daily, can add OTC hydrocortisone BID prn. I think headache is related to sinuses. Take 0.5 tabs of Claritin daily and restart Flonase. Acetaminophen for pain.  Follow up in 2 weeks to recheck skin and HA's. The patient voiced understanding and agreement to the plan.  [place], [place] 10:51 AM 03/12/16

## 2016-03-12 NOTE — Progress Notes (Signed)
Pre visit review using our clinic review tool, if applicable. No additional management support is needed unless otherwise documented below in the visit note. 

## 2016-03-12 NOTE — Patient Instructions (Addendum)
Use lotion twice daily and hydrocortisone 1% for itching.  Stay well hydrated. Take 1/2 a Claritin daily. Start using Flonase again. Tylenol 650 mg (2 regular strength tabs) every 6 hours for pain.  Cancel appt if you are doing better.

## 2016-03-17 ENCOUNTER — Telehealth: Payer: Self-pay | Admitting: Family Medicine

## 2016-03-17 DIAGNOSIS — N400 Enlarged prostate without lower urinary tract symptoms: Secondary | ICD-10-CM

## 2016-03-17 MED ORDER — FINASTERIDE 5 MG PO TABS: 5.0000 mg | ORAL_TABLET | Freq: Every day | ORAL | 0 refills | Status: DC

## 2016-03-17 MED ORDER — METOPROLOL SUCCINATE ER 50 MG PO TB24
50.0000 mg | ORAL_TABLET | Freq: Every day | ORAL | 0 refills | Status: DC
Start: 2016-03-17 — End: 2016-03-27

## 2016-03-17 NOTE — Telephone Encounter (Signed)
°  Relation to [id number] Call back number:[phone] Pharmacy: [place], [place] [phone] (Fax)     Reason for call:  Patient requesting a refill finasteride (PROSCAR) 5 MG tablet and metoprolol succinate (TOPROL-XL) 50 MG 24 hr tablet

## 2016-03-27 ENCOUNTER — Telehealth: Payer: Self-pay | Admitting: Family Medicine

## 2016-03-27 ENCOUNTER — Ambulatory Visit (INDEPENDENT_AMBULATORY_CARE_PROVIDER_SITE_OTHER): Payer: Medicare Other | Admitting: Family Medicine

## 2016-03-27 ENCOUNTER — Encounter: Payer: Self-pay | Admitting: Family Medicine

## 2016-03-27 VITALS — BP 127/79 | HR 77 | Temp 97.7°F | Ht 67.0 in | Wt 181.2 lb

## 2016-03-27 DIAGNOSIS — R51 Headache: Secondary | ICD-10-CM

## 2016-03-27 DIAGNOSIS — E785 Hyperlipidemia, unspecified: Secondary | ICD-10-CM

## 2016-03-27 DIAGNOSIS — R490 Dysphonia: Secondary | ICD-10-CM

## 2016-03-27 DIAGNOSIS — R519 Headache, unspecified: Secondary | ICD-10-CM

## 2016-03-27 MED ORDER — METOPROLOL SUCCINATE ER 50 MG PO TB24: 50.0000 mg | ORAL_TABLET | Freq: Every day | ORAL | 0 refills | Status: DC

## 2016-03-27 MED ORDER — SIMVASTATIN 20 MG PO TABS: 20.0000 mg | ORAL_TABLET | Freq: Every day | ORAL | 0 refills | Status: DC

## 2016-03-27 NOTE — Telephone Encounter (Signed)
Caller name: Relationship to patient: Self Can be reached: [phone] Pharmacy:  [facility] [address] [place] - [place] [phone] (Phone) [phone] (Fax)     Reason for call: States he did not get a refill on his simvastatin (ZOCOR) 20 MG tablet this morning and needs to have it called in to pharmacy

## 2016-03-27 NOTE — Progress Notes (Signed)
Pre visit review using our clinic review tool, if applicable. No additional management support is needed unless otherwise documented below in the visit note. 

## 2016-03-27 NOTE — Patient Instructions (Addendum)
Get an air humidifier.   Stay with the Flonase for the next 6 weeks.  You can consider taking Magnesium 400 mg (or 500 mg) every day. After 2 weeks, you can start taking 2 tabs after 2 weeks.   Stop taking Beta-plus for now.

## 2016-03-27 NOTE — Telephone Encounter (Signed)
Done

## 2016-03-27 NOTE — Progress Notes (Signed)
Chief Complaint  Patient presents with  . Headache    F/U. States that there isn't much change.    Robert Pacheco is a 83 y.o. male here for evaluation of headache.  Duration: 6 weeks Palliation: Tylenol, stopped using Ibuprofen at last appt Provocation: unknown Severity: 4/10 Quality: dull Associated symptoms: none Denies: nausea, vomiting, sonophobia, photophobia, diplopia, vertigo, tinnitus, ataxia, lacrimation, nasal congestion Currently with headache? No Failed therapies: Was taking Claritin and Flonase- has not noticed a significant difference.  He also notes that he has had some hoarseness in his voice for the past 6 mo or so. He likes to sing at church, but has not been able to do so. He is a former smoker, and dabbled a bit when he was around 70. No weight loss.  Past Medical History:  Diagnosis Date  . Allergy   . Hyperlipidemia   . Hypertension   . Personal history of colonic adenomas 05/10/2012  . Skin cancer, basal cell    Family History  Problem Relation Age of Onset  . Heart disease Father   . Colon cancer Brother 71  . Healthy Child     x 4  . Healthy Grandchild     x 11  . Colon cancer Sister 10  . Prostate cancer Brother    Current Meds  Medication Sig  . aspirin 81 MG chewable tablet Chew 81 mg by mouth.  . docusate sodium (COLACE) 100 MG capsule Take 100 mg by mouth daily.  . finasteride (PROSCAR) 5 MG tablet Take 1 tablet (5 mg total) by mouth daily.  . fish oil-omega-3 fatty acids 1000 MG capsule Take 2 g by mouth daily.    . fluticasone (FLONASE) 50 MCG/ACT nasal spray Place 2 sprays into both nostrils daily.  [name] ketoconazole (NIZORAL) 2 % cream APPLY CREAM TOPICALLY TWICE DAILY (Patient taking differently: 2 (two) times daily as needed. APPLY CREAM TOPICALLY TWICE DAILY)  . metoprolol succinate (TOPROL-XL) 50 MG 24 hr tablet Take 1 tablet (50 mg total) by mouth daily. Take with or immediately following a meal.  . nitroGLYCERIN (NITROSTAT) 0.4 MG SL  tablet Place 1 tablet (0.4 mg total) under the tongue every 5 (five) minutes as needed.  . psyllium (METAMUCIL) 58.6 % packet Take 1 packet by mouth daily.  . Saw Palmetto 450 MG CAPS Take 2 capsules (900 mg total) by mouth daily.  . simvastatin (ZOCOR) 20 MG tablet Take 1 tablet (20 mg total) by mouth at bedtime.    BP 127/79 (BP Location: Left Arm, Patient Position: Sitting, Cuff Size: Normal)   Pulse 77   Temp 97.7 F (36.5 C) (Oral)   Ht 5\' 7"  (1.702 m)   Wt 181 lb 3.2 oz (82.2 kg)   SpO2 99% Comment: RA  BMI 28.38 kg/m  General: awake, alert, appearing stated age Eyes: PERRLA, EOMi Nose: Nares patent, no sinus tenderness Heart: RRR, no murmurs, no bruits Lungs: CTAB, no accessory muscle use Neuro: CN 2-12 intact, no cerebellar signs, DTR's equal and symmetry, no clonus MSK: 5/5 strength throughout, normal gait, no TTP over posterior cervical triangle or paraspinal cervical musculature Psych: Age appropriate judgment and insight, mood and affect normal  Frontal headache  Hoarseness  Orders as above. No red flag signs. Start taking Mg daily. OK to increase after 2 weeks. Offered referral to ENT for hoarseness for a scope if he is concerned, he will hold off for now and continue Claritin and Flonase. Follow up in 4 weeks. If  no improvement, will discuss other prophylactic med (Topamax vs Propranolol) vs imaging vs referral.  The patient voiced understanding and agreement to the plan.  [place], DO 10:16 AM 03/27/16

## 2016-04-28 ENCOUNTER — Telehealth: Payer: Self-pay

## 2016-04-28 NOTE — Telephone Encounter (Signed)
Called patient regarding call that wa made to [facility]. Patient cut finger but state it is not bleeding at this time and they have been putting Nupirocin 2% ung on area. Paitent had last Td 09/11/14. Wife states patient was using the nasal spray he was prescribed  And his nose started bleeding so he stopped using. Please advise if patient needs a different medication.

## 2016-04-29 NOTE — Telephone Encounter (Signed)
Not at this time. If they think it is infected, schedule appt. TY.

## 2016-04-29 NOTE — Telephone Encounter (Signed)
Called left message to call back 

## 2016-04-29 NOTE — Telephone Encounter (Signed)
Patient returned phone call and his finger is fine today/no infection. He will call if any changes.

## 2016-06-26 ENCOUNTER — Telehealth: Payer: Self-pay | Admitting: Family Medicine

## 2016-06-26 DIAGNOSIS — N400 Enlarged prostate without lower urinary tract symptoms: Secondary | ICD-10-CM

## 2016-06-26 NOTE — Telephone Encounter (Signed)
Relation to [id number] Call back number:[phone] Pharmacy: [place], [place] [phone] (Fax)     Reason for call:  Patient requesting a refill finasteride (PROSCAR) 5 MG tablet and ketoconazole (NIZORAL) 2 % cream

## 2016-06-26 NOTE — Telephone Encounter (Signed)
Requesting Nizoral 2% cream-Apply cream topically twice daily. Last refill:08/24/15 Last OV:03/27/16 Please advise.//AB/CMA

## 2016-06-26 NOTE — Telephone Encounter (Signed)
OK to refill 90 day supply, schedule appt in 3 mo w me for a medication check visit. TY.

## 2016-06-27 MED ORDER — FINASTERIDE 5 MG PO TABS: 5.0000 mg | ORAL_TABLET | Freq: Every day | ORAL | 0 refills | Status: DC

## 2016-06-27 MED ORDER — KETOCONAZOLE 2 % EX CREA: TOPICAL_CREAM | CUTANEOUS | 1 refills | Status: DC

## 2016-06-27 NOTE — Telephone Encounter (Signed)
Rx's approved and sent to the pharmacy by e-script.  Pt aware.//AB/CMA

## 2016-06-27 NOTE — Telephone Encounter (Signed)
Patient checking on the status of message below, patient states he will be going out of town this weekend, please advise

## 2016-06-27 NOTE — Telephone Encounter (Signed)
Patient scheduled 3 month follow up for 10/08/2016 with PCP

## 2016-07-08 ENCOUNTER — Telehealth: Payer: Self-pay | Admitting: Family Medicine

## 2016-07-08 DIAGNOSIS — E785 Hyperlipidemia, unspecified: Secondary | ICD-10-CM

## 2016-07-08 NOTE — Telephone Encounter (Signed)
Caller name: Relationship to patient: Self Can be reached: [phone]  Pharmacy:  [facility] [address], [place] - [place] [phone] (Phone) [phone] (Fax)     Reason for call: Refill simvastatin (ZOCOR) 20 MG tablet

## 2016-07-09 MED ORDER — SIMVASTATIN 20 MG PO TABS: 20.0000 mg | ORAL_TABLET | Freq: Every day | ORAL | 0 refills | Status: DC

## 2016-07-09 NOTE — Telephone Encounter (Signed)
Rx approved and sent to the pharmacy by e-script.  Pt has an appt scheduled for (10/08/16).//AB/CMA

## 2016-08-07 ENCOUNTER — Encounter (HOSPITAL_COMMUNITY): Payer: Self-pay | Admitting: Emergency Medicine

## 2016-08-07 ENCOUNTER — Emergency Department (HOSPITAL_COMMUNITY)
Admission: EM | Admit: 2016-08-07 | Discharge: 2016-08-07 | Disposition: A | Discharge status: Home / Self Care | Payer: Medicare Other | Attending: Emergency Medicine | Admitting: Emergency Medicine

## 2016-08-07 ENCOUNTER — Emergency Department (HOSPITAL_COMMUNITY): Payer: Medicare Other

## 2016-08-07 DIAGNOSIS — Y999 Unspecified external cause status: Secondary | ICD-10-CM | POA: Insufficient documentation

## 2016-08-07 DIAGNOSIS — Z7982 Long term (current) use of aspirin: Secondary | ICD-10-CM | POA: Diagnosis not present

## 2016-08-07 DIAGNOSIS — Z87891 Personal history of nicotine dependence: Secondary | ICD-10-CM | POA: Diagnosis not present

## 2016-08-07 DIAGNOSIS — Y929 Unspecified place or not applicable: Secondary | ICD-10-CM | POA: Insufficient documentation

## 2016-08-07 DIAGNOSIS — S51811A Laceration without foreign body of right forearm, initial encounter: Secondary | ICD-10-CM | POA: Insufficient documentation

## 2016-08-07 DIAGNOSIS — Z85828 Personal history of other malignant neoplasm of skin: Secondary | ICD-10-CM | POA: Insufficient documentation

## 2016-08-07 DIAGNOSIS — Y93H3 Activity, building and construction: Secondary | ICD-10-CM | POA: Insufficient documentation

## 2016-08-07 DIAGNOSIS — Z79899 Other long term (current) drug therapy: Secondary | ICD-10-CM | POA: Insufficient documentation

## 2016-08-07 DIAGNOSIS — W458XXA Other foreign body or object entering through skin, initial encounter: Secondary | ICD-10-CM | POA: Insufficient documentation

## 2016-08-07 DIAGNOSIS — I25729 Atherosclerosis of autologous artery coronary artery bypass graft(s) with unspecified angina pectoris: Secondary | ICD-10-CM | POA: Diagnosis not present

## 2016-08-07 DIAGNOSIS — I10 Essential (primary) hypertension: Secondary | ICD-10-CM | POA: Diagnosis not present

## 2016-08-07 DIAGNOSIS — S59911A Unspecified injury of right forearm, initial encounter: Secondary | ICD-10-CM | POA: Diagnosis present

## 2016-08-07 MED ORDER — LIDOCAINE HCL (PF) 1 % IJ SOLN
30.0000 mL | Freq: Once | INTRAMUSCULAR | Status: AC
  Administered 2016-08-07: 30 mL
  Filled 2016-08-07: qty 30

## 2016-08-07 MED ORDER — LIDOCAINE-EPINEPHRINE-TETRACAINE (LET) SOLUTION
3.0000 mL | Freq: Once | NASAL | Status: AC
  Administered 2016-08-07: 3 mL via TOPICAL
  Filled 2016-08-07: qty 3

## 2016-08-07 NOTE — ED Provider Notes (Signed)
[facility] DEPT Provider Note   CSN: [id number] Arrival date & time: 08/07/16  1117     History   Chief Complaint Chief Complaint  Patient presents with  . Extremity Laceration    HPI Robert Pacheco is a 83 y.o. male presenting via EMS after sustaining a laceration to his right forearm just distal to the antecubital fossa while attempting to cut through plywood. He explains that the saw got stuck and he was injured by the corner of the plywood. Bleeding controlled. No anticoagulant use. Tetanus up to date. No other injuries or complaints.  HPI  Past Medical History:  Diagnosis Date  . Allergy   . Hyperlipidemia   . Hypertension   . Personal history of colonic adenomas 05/10/2012  . Skin cancer, basal cell     Patient Active Problem List   Diagnosis Date Noted  . Combined forms of age-related cataract of left eye 03/09/2014  . Regular astigmatism 03/09/2014  . Family history of colon cancer 10/01/2012  . History of colonic polyps 05/10/2012  . BPH (benign prostatic hyperplasia) 03/27/2009  . DYSPLASTIC NEVUS, [place] 10/05/2008  . CORONARY ATHEROSCLEROSIS OF ARTERY BYPASS GRAFT 03/02/2007  . Hyperlipidemia 02/22/2007  . Essential hypertension 02/22/2007    Past Surgical History:  Procedure Laterality Date  . CATARACT EXTRACTION    . COLONOSCOPY  multiple  . CORONARY ARTERY BYPASS GRAFT  2000  . EYE SURGERY    . HERNIA REPAIR    . MOHS SURGERY         Home Medications    Prior to Admission medications   Medication Sig Start Date End Date Taking? Authorizing Provider  aspirin 81 MG chewable tablet Chew 81 mg by mouth.    [provider]  docusate sodium (COLACE) 100 MG capsule Take 100 mg by mouth daily.    [provider]  finasteride (PROSCAR) 5 MG tablet Take 1 tablet (5 mg total) by mouth daily. 06/27/16   Shelda Pal, DO  fish oil-omega-3 fatty acids 1000 MG capsule Take 2 g by mouth daily.      [provider]    fluticasone (FLONASE) 50 MCG/ACT nasal spray Place 2 sprays into both nostrils daily. 03/12/16   Shelda Pal, DO  ketoconazole (NIZORAL) 2 % cream APPLY CREAM TOPICALLY TWICE DAILY 06/27/16   Shelda Pal, DO  metoprolol succinate (TOPROL-XL) 50 MG 24 hr tablet Take 1 tablet (50 mg total) by mouth daily. Take with or immediately following a meal. 03/27/16   Wendling, Crosby Oyster, DO  nitroGLYCERIN (NITROSTAT) 0.4 MG SL tablet Place 1 tablet (0.4 mg total) under the tongue every 5 (five) minutes as needed. 12/13/14   Brunetta Jeans, PA-C  psyllium (METAMUCIL) 58.6 % packet Take 1 packet by mouth daily.    [provider]  Saw Palmetto 450 MG CAPS Take 2 capsules (900 mg total) by mouth daily. 10/12/15   Brunetta Jeans, PA-C  simvastatin (ZOCOR) 20 MG tablet Take 1 tablet (20 mg total) by mouth at bedtime. 07/09/16   Shelda Pal, DO    Family History Family History  Problem Relation Age of Onset  . Heart disease Father   . Colon cancer Brother 12  . Healthy Child        x 4  . Healthy Grandchild        x 11  . Colon cancer Sister 76  . Prostate cancer Brother     Social History Social History  Substance  Use Topics  . Smoking status: Former Research scientist (life sciences)  . Smokeless tobacco: Never Used  . Alcohol use No     Allergies   Other   Review of Systems Review of Systems  Constitutional: Negative for chills and fever.  HENT: Negative for ear pain and sore throat.   Eyes: Negative for pain and visual disturbance.  Respiratory: Negative for cough, shortness of breath, wheezing and stridor.   Cardiovascular: Negative for chest pain and palpitations.  Gastrointestinal: Negative for abdominal pain, nausea and vomiting.  Genitourinary: Negative for dysuria and hematuria.  Musculoskeletal: Negative for arthralgias, back pain and neck pain.  Skin: Positive for wound. Negative for color change, pallor and rash.  Neurological: Negative for seizures,  syncope, weakness and numbness.     Physical Exam Updated Vital Signs BP (!) 154/77 (BP Location: Left Arm)   Pulse 68   Temp 98.1 F (36.7 C) (Oral)   Resp 19   Ht 5' 7.5" (1.715 m)   Wt 81.6 kg (180 lb)   SpO2 99%   BMI 27.78 kg/m   Physical Exam  Constitutional: He appears well-developed and well-nourished. No distress.  Afebrile, nontoxic-appearing, sitting comfortably in bed in no acute distress.  HENT:  Head: Normocephalic and atraumatic.  Eyes: Conjunctivae are normal.  Neck: Neck supple.  Cardiovascular: Normal rate, regular rhythm, normal heart sounds and intact distal pulses.   No murmur heard. Pulmonary/Chest: Effort normal and breath sounds normal. No respiratory distress. He has no wheezes. He has no rales.  Abdominal: He exhibits no distension.  Musculoskeletal: Normal range of motion. He exhibits no edema or deformity.  Full rom, 5/5 strength grips NVI distally   Neurological: He is alert. No sensory deficit.  Skin: Skin is warm and dry. Capillary refill takes less than 2 seconds. No rash noted. He is not diaphoretic. No erythema. No pallor.  Right U-shaped distal to antecubital fossa laceration ~ 10cm. Bleeding controlled.  Psychiatric: He has a normal mood and affect.  Nursing note and vitals reviewed.        ED Treatments / Results  Labs (all labs ordered are listed, but only abnormal results are displayed) Labs Reviewed - No data to display  EKG  EKG Interpretation None       Radiology Dg Elbow Complete Right  Result Date: 08/07/2016 CLINICAL DATA:  Laceration to proximal right forearm. Bruising to elbow. EXAM: RIGHT ELBOW - COMPLETE 3+ VIEW COMPARISON:  None. FINDINGS: No acute bony abnormality. Specifically, no fracture, subluxation, or dislocation. Soft tissues are intact. No joint effusion. IMPRESSION: No acute bony abnormality. Electronically Signed   By: Rolm Baptise M.D.   On: 08/07/2016 12:38   Dg Forearm Right  Result Date:  08/07/2016 CLINICAL DATA:  Laceration to proximal right forearm. EXAM: RIGHT FOREARM - 2 VIEW COMPARISON:  None. FINDINGS: Soft tissue laceration noted in the proximal to mid soft tissues anteriorly. No radiopaque foreign body. No fracture, subluxation or dislocation. IMPRESSION: No acute bony abnormality or radiopaque foreign body. Electronically Signed   By: Rolm Baptise M.D.   On: 08/07/2016 12:38    Procedures Procedures (including critical care time) LACERATION REPAIR Performed by: Emeline General Authorized by: Emeline General Consent: Verbal consent obtained. Risks and benefits: risks, benefits and alternatives were discussed Consent given by: patient Patient identity confirmed: provided demographic data Prepped and Draped in normal sterile fashion Wound explored  Laceration Location: forearm right  Laceration Length: ~ 10 cm  No Foreign Bodies seen or palpated  Anesthesia: local infiltration  Local anesthetic: lidocaine 1% w/out epinephrine  Anesthetic total: 10 ml  Irrigation method: syringe Amount of cleaning: standard  Skin closure: 5.0 absorbable vicryl and non-absorbable proplene  Number of sutures: 15 (horizontal mattress), 4 (subQ absorbable)  Technique: buried subcutaneous and  Horizontal mattress  Patient tolerance: Patient tolerated the procedure well with no immediate complications.  LACERATION REPAIR Performed by: Emeline General Authorized by: Emeline General Consent: Verbal consent obtained. Risks and benefits: risks, benefits and alternatives were discussed Consent given by: patient Patient identity confirmed: provided demographic data Prepped and Draped in normal sterile fashion Wound explored  Laceration Location: forearm right  Laceration Length: 4cm  No Foreign Bodies seen or palpated  Anesthesia: local infiltration  Local anesthetic: lidocaine 1% w/out epinephrine  Anesthetic total: 3 ml  Irrigation method:  syringe Amount of cleaning: standard  Skin closure: 5.0 non-absorbable proplene  Number of sutures: 7  Technique: simple interrupted  Patient tolerance: Patient tolerated the procedure well with no immediate complications.  Medications Ordered in ED Medications  lidocaine (PF) (XYLOCAINE) 1 % injection 30 mL (30 mLs Infiltration Given 08/07/16 1231)  lidocaine-EPINEPHrine-tetracaine (LET) solution (3 mLs Topical Given 08/07/16 1231)     Initial Impression / Assessment and Plan / ED Course  I have reviewed the triage vital signs and the nursing notes.  Pertinent labs & imaging results that were available during my care of the patient were reviewed by me and considered in my medical decision making (see chart for details).     Patient presents with a deep laceration sustained while attempting to cut through plywood with a saw. Full range of motion at the elbow and all fingers. 5 out of 5 strength to grip. Neurovascularly intact distally. Strong radial pulses and brisk cap refills. No tendon involvement.  Copious irrigation performed. Wound explored and base of wound visualized in a bloodless field without evidence of foreign body.  Laceration occurred < 8 hours prior to repair which was well tolerated. Tdap up to date.    Discussed suture home care with patient and answered questions.   Pt to follow-up for wound check and suture removal in 7-10 days; they are to return to the ED sooner for signs of infection. Pt is hemodynamically stable with no complaints prior to dc.   Discussed strict return precautions and advised to return to the emergency department if experiencing any new or worsening symptoms. Instructions were understood and patient agreed with discharge plan. Final Clinical Impressions(s) / ED Diagnoses   Final diagnoses:  Laceration of right forearm, initial encounter    New Prescriptions Discharge Medication List as of 08/07/2016  4:24 PM       Emeline General,  PA-C 08/07/16 2025    Long, Wonda Olds, MD 08/08/16 [phone]

## 2016-08-07 NOTE — ED Triage Notes (Signed)
Per EMS, patient was working with saw and piece of wood kicked back causing laceration to right forearm.  Minimal blood loss.  Hx of 4 bypasses.  Takes aspirin daily.  170/90, 80 HR, 16 RR.

## 2016-08-07 NOTE — Discharge Instructions (Addendum)
As discussed, keep the area clean and dry and monitor for any signs of infection. Tylenol for pain. Have the wound rechecked in 3 days. Return to be seen sooner if you experience redness, warmth, swelling, increased pain, purulent discharge, fever or other concerning symptoms in the meantime.

## 2016-08-07 NOTE — ED Notes (Signed)
Applied non-adherent gauze to sutured laceration.  Gave family extra supplies for home.

## 2016-08-07 NOTE — ED Notes (Signed)
EDP at bedside irrigating and suturing laceration.

## 2016-08-07 NOTE — ED Notes (Signed)
LET applied to laceration and laceration re-dressed with gauze while numbing medicine takes affect.  Radial pulse still good on right wrist.  Hand cold so placing warm heat pack on hand and covering with blanket.

## 2016-08-09 ENCOUNTER — Emergency Department (HOSPITAL_COMMUNITY)
Admission: EM | Admit: 2016-08-09 | Discharge: 2016-08-09 | Disposition: A | Discharge status: Home / Self Care | Payer: Medicare Other | Attending: Emergency Medicine | Admitting: Emergency Medicine

## 2016-08-09 ENCOUNTER — Encounter (HOSPITAL_COMMUNITY): Payer: Self-pay

## 2016-08-09 DIAGNOSIS — Z951 Presence of aortocoronary bypass graft: Secondary | ICD-10-CM | POA: Insufficient documentation

## 2016-08-09 DIAGNOSIS — Z7982 Long term (current) use of aspirin: Secondary | ICD-10-CM | POA: Insufficient documentation

## 2016-08-09 DIAGNOSIS — E785 Hyperlipidemia, unspecified: Secondary | ICD-10-CM | POA: Insufficient documentation

## 2016-08-09 DIAGNOSIS — Z87891 Personal history of nicotine dependence: Secondary | ICD-10-CM | POA: Diagnosis not present

## 2016-08-09 DIAGNOSIS — I10 Essential (primary) hypertension: Secondary | ICD-10-CM | POA: Insufficient documentation

## 2016-08-09 DIAGNOSIS — Z85828 Personal history of other malignant neoplasm of skin: Secondary | ICD-10-CM | POA: Insufficient documentation

## 2016-08-09 DIAGNOSIS — Z9889 Other specified postprocedural states: Secondary | ICD-10-CM | POA: Diagnosis not present

## 2016-08-09 DIAGNOSIS — S41111D Laceration without foreign body of right upper arm, subsequent encounter: Secondary | ICD-10-CM

## 2016-08-09 NOTE — ED Provider Notes (Signed)
[facility] Provider Note   CSN: [id number] Arrival date & time: 08/09/16  1654  By signing my name below, I, Robert Pacheco, attest that this documentation has been prepared under the direction and in the presence of , Wyn Quaker, [place]. Electronically Signed: Lise Auer, ED Scribe. 08/09/16. 7:15 PM.  History   Chief Complaint Chief Complaint  Patient presents with  . Laceration   HPI  HPI Comments: Robert Pacheco is a 83 y.o. male with a history of HLD, HTN, and skin cancer, who presents to the Emergency Department for evaluation of his wound to his right forearm distal to the antecubital fossa that was sutured two days ago. He notes associated bleeding, swelling, redness, and mild tenderness to the area. Bleeding is controlled at this time. Tetanus is up to date. He was seen on 7/12 after sustaining a laceration to the right forearm while cutting through plywood. He is not currently on anticoagulations. Currently on 81 mg PO ASA  daily.  Denies fever, chills, nausea, emesis, or fatigue.   Past Medical History:  Diagnosis Date  . Allergy   . Hyperlipidemia   . Hypertension   . Personal history of colonic adenomas 05/10/2012  . Skin cancer, basal cell     Patient Active Problem List   Diagnosis Date Noted  . Combined forms of age-related cataract of left eye 03/09/2014  . Regular astigmatism 03/09/2014  . Family history of colon cancer 10/01/2012  . History of colonic polyps 05/10/2012  . BPH (benign prostatic hyperplasia) 03/27/2009  . DYSPLASTIC NEVUS, [place] 10/05/2008  . CORONARY ATHEROSCLEROSIS OF ARTERY BYPASS GRAFT 03/02/2007  . Hyperlipidemia 02/22/2007  . Essential hypertension 02/22/2007    Past Surgical History:  Procedure Laterality Date  . CATARACT EXTRACTION    . COLONOSCOPY  multiple  . CORONARY ARTERY BYPASS GRAFT  2000  . EYE SURGERY    . HERNIA REPAIR    . MOHS SURGERY         Home Medications    Prior to Admission medications     Medication Sig Start Date End Date Taking? Authorizing Provider  aspirin 81 MG chewable tablet Chew 81 mg by mouth.    [provider]  docusate sodium (COLACE) 100 MG capsule Take 100 mg by mouth daily.    [provider]  finasteride (PROSCAR) 5 MG tablet Take 1 tablet (5 mg total) by mouth daily. 06/27/16   Shelda Pal, DO  fish oil-omega-3 fatty acids 1000 MG capsule Take 2 g by mouth daily.      [provider]  fluticasone (FLONASE) 50 MCG/ACT nasal spray Place 2 sprays into both nostrils daily. 03/12/16   Shelda Pal, DO  ketoconazole (NIZORAL) 2 % cream APPLY CREAM TOPICALLY TWICE DAILY 06/27/16   Shelda Pal, DO  metoprolol succinate (TOPROL-XL) 50 MG 24 hr tablet Take 1 tablet (50 mg total) by mouth daily. Take with or immediately following a meal. 03/27/16   Wendling, Crosby Oyster, DO  nitroGLYCERIN (NITROSTAT) 0.4 MG SL tablet Place 1 tablet (0.4 mg total) under the tongue every 5 (five) minutes as needed. 12/13/14   Brunetta Jeans, PA-C  psyllium (METAMUCIL) 58.6 % packet Take 1 packet by mouth daily.    [provider]  Saw Palmetto 450 MG CAPS Take 2 capsules (900 mg total) by mouth daily. 10/12/15   Brunetta Jeans, PA-C  simvastatin (ZOCOR) 20 MG tablet Take 1 tablet (20 mg total) by mouth at bedtime. 07/09/16  Shelda Pal, DO    Family History Family History  Problem Relation Age of Onset  . Heart disease Father   . Colon cancer Brother 59  . Healthy Child        x 4  . Healthy Grandchild        x 11  . Colon cancer Sister 96  . Prostate cancer Brother     Social History Social History  Substance Use Topics  . Smoking status: Former Research scientist (life sciences)  . Smokeless tobacco: Never Used  . Alcohol use No   Allergies   Other  Review of Systems Review of Systems  Constitutional: Negative for chills, fatigue and fever.  Eyes: Negative for visual disturbance.  Respiratory: Negative for  shortness of breath.   Gastrointestinal: Negative for nausea and vomiting.  Musculoskeletal: Negative for neck pain.  Skin: Positive for wound.       Sutured laceration to the right forearm distal to the antecubital fossa.   Neurological: Negative for dizziness, weakness, light-headedness and numbness.  Hematological: Bruises/bleeds easily.    Physical Exam Updated Vital Signs BP (!) 156/93 (BP Location: Left Arm)   Pulse (!) 54   Temp 98 F (36.7 C) (Oral)   Resp 16   Ht 5' 7.5" (1.715 m)   Wt 81.6 kg (180 lb)   SpO2 99%   BMI 27.78 kg/m     Physical Exam  Constitutional: He appears well-developed and well-nourished.  Eyes: No scleral icterus.  Musculoskeletal:  Right Arm compartments are soft without pain through range of motion.  Neurological: He is alert.  Skin: Skin is warm and dry. He is not diaphoretic.  Large area of ecchymosis on right forearm. The sutured lacerations are intact with minimal blood oozing and serosanguineous drainage. No drainage of purulent material.  The area is not abnormally warm or painful to palpation.  Distally, the hand is warm and well perfused with intact function.   Psychiatric: He has a normal mood and affect. His behavior is normal.  Nursing note and vitals reviewed.     ED Treatments / Results  DIAGNOSTIC STUDIES: Oxygen Saturation is 98% on RA, normal by my interpretation.   COORDINATION OF CARE: 6:54 PM-Discussed next steps with pt. Pt verbalized understanding and is agreeable with the plan.   Labs (all labs ordered are listed, but only abnormal results are displayed) Labs Reviewed - No data to display  EKG  EKG Interpretation None       Radiology No results found.  Procedures Procedures (including critical care time)  Medications Ordered in ED Medications - No data to display   Initial Impression / Assessment and Plan / ED Course  I have reviewed the triage vital signs and the nursing notes.  Pertinent  labs & imaging results that were available during my care of the patient were reviewed by me and considered in my medical decision making (see chart for details).    Robert Pacheco presents today for evaluation of a previously sutured wound as he is concerned that it is continuing to ooze blood. I reassured patient that this is normal and expected given the injury sustained. He is not on blood thinners, is not having orthostatic type symptoms. The large area of erythema is nonblanching, appears consistent with hematoma from his injury. The red area was marked.  No obvious signs of infection at this time.  This patient was discussed with and seen by Dr. Leonette Monarch.  Patient was given the option to his questions,  all of which were answered to the best of my abilities. Patient was given return precautions and stated his understanding. Patient DC to home with PCP follow up as needed, and for suture removal.    Final Clinical Impressions(s) / ED Diagnoses   Final diagnoses:  Laceration of right upper extremity, subsequent encounter    New Prescriptions Discharge Medication List as of 08/09/2016  7:20 PM    I personally performed the services described in this documentation, which was scribed in my presence. The recorded information has been reviewed and is accurate.    Lorin Glass, PA-C 08/11/16 0152    Fatima Blank, MD 08/12/16 [phone]

## 2016-08-09 NOTE — ED Provider Notes (Signed)
Medical screening examination/treatment/procedure(s) were conducted as a shared visit with non-physician practitioner(s) and myself.  I personally evaluated the patient during the encounter. Briefly, the patient is a 83 y.o. male who presents to the ED for wound check. Patient sustained right forearm laceration that was sutured 2 days ago. Came in because he had some clear discharge and skin changes surrounding the wound. On exam discharge is serous, nonpurulent, nonbloody. Skin changes appears to be settling ecchymosis. Low suspicion for infection at this time. Continued monitoring recommended. Strict return precautions given. Patient already has an appointment with his PCP in 2 days who can reexamine.   EKG Interpretation None           [place]ama, Grayce Sessions, MD 08/09/16 1924

## 2016-08-09 NOTE — Discharge Instructions (Signed)
Please return to the ED if you have any other questions or concerns.  If the area of redness extends outside  of the line please contact your doctor or return to the ED.   It is normal to have oozing or slight bleeding from a cut even after sutures (stitches) are placed.

## 2016-08-09 NOTE — ED Triage Notes (Signed)
Pt presents to the ed after getting stitches put in his right arm, pt states it is still bleeding and he did not think it still should be bleeding. On this rn assessment pt has stitches in place that are intact with minimal bleeding, redressed by this rn. No signs of infection.

## 2016-08-09 NOTE — ED Notes (Signed)
ED EMT in dressing pt's arm at this time per Dr. Leonette Monarch.

## 2016-08-11 ENCOUNTER — Ambulatory Visit (INDEPENDENT_AMBULATORY_CARE_PROVIDER_SITE_OTHER): Payer: Medicare Other | Admitting: Family Medicine

## 2016-08-11 ENCOUNTER — Encounter: Payer: Self-pay | Admitting: Family Medicine

## 2016-08-11 VITALS — BP 120/70 | HR 61 | Temp 97.9°F | Ht 67.0 in | Wt 181.6 lb

## 2016-08-11 DIAGNOSIS — S41111A Laceration without foreign body of right upper arm, initial encounter: Secondary | ICD-10-CM | POA: Diagnosis not present

## 2016-08-11 NOTE — Progress Notes (Signed)
Chief Complaint  Patient presents with  . Follow-up    for (R) lower arm injury    Subjective: Patient is a 83 y.o. male here for ED f/u.  On 7/12, the patient had an issue with a piece of wood and had a laceration on his right forearm. He went to the emergency department and had nearly 30 stitches placed. 2 days later, he returned due to redness and swelling. It was not felt that he had an infection and no antibiotics were given. Today, he notes improvement in both the swelling and redness. Pain has never been an issue. He denies any purulent drainage or fevers. He has not washed it and has been very guarded with his movements.   ROS: Const: No fevers  Family History  Problem Relation Age of Onset  . Heart disease Father   . Colon cancer Brother 22  . Healthy Child        x 4  . Healthy Grandchild        x 11  . Colon cancer Sister 38  . Prostate cancer Brother    Past Medical History:  Diagnosis Date  . Allergy   . Hyperlipidemia   . Hypertension   . Personal history of colonic adenomas 05/10/2012  . Skin cancer, basal cell    Allergies  Allergen Reactions  . Other Nausea Only    UNKNOWN PAIN MED    Current Outpatient Prescriptions:  .  aspirin 81 MG chewable tablet, Chew 81 mg by mouth., Disp: , Rfl:  .  docusate sodium (COLACE) 100 MG capsule, Take 100 mg by mouth daily., Disp: , Rfl:  .  finasteride (PROSCAR) 5 MG tablet, Take 1 tablet (5 mg total) by mouth daily., Disp: 90 tablet, Rfl: 0 .  fish oil-omega-3 fatty acids 1000 MG capsule, Take 2 g by mouth daily.  , Disp: , Rfl:  .  ketoconazole (NIZORAL) 2 % cream, APPLY CREAM TOPICALLY TWICE DAILY, Disp: 60 g, Rfl: 1 .  metoprolol succinate (TOPROL-XL) 50 MG 24 hr tablet, Take 1 tablet (50 mg total) by mouth daily. Take with or immediately following a meal., Disp: 90 tablet, Rfl: 0 .  nitroGLYCERIN (NITROSTAT) 0.4 MG SL tablet, Place 1 tablet (0.4 mg total) under the tongue every 5 (five) minutes as needed., Disp:  25 tablet, Rfl: 1 .  psyllium (METAMUCIL) 58.6 % packet, Take 1 packet by mouth daily., Disp: , Rfl:  .  Saw Palmetto 450 MG CAPS, Take 2 capsules (900 mg total) by mouth daily., Disp: , Rfl:  .  simvastatin (ZOCOR) 20 MG tablet, Take 1 tablet (20 mg total) by mouth at bedtime., Disp: 90 tablet, Rfl: 0  Current Facility-Administered Medications:  .  0.9 %  sodium chloride infusion, 500 mL, Intravenous, Continuous, Gatha Mayer, MD  Objective: BP 120/70 (BP Location: Left Arm, Patient Position: Sitting, Cuff Size: Normal)   Pulse 61   Temp 97.9 F (36.6 C) (Oral)   Ht 5\' 7"  (1.702 m)   Wt 181 lb 9.6 oz (82.4 kg)   SpO2 98%   BMI 28.44 kg/m  General: Awake, appears stated age Skin: prox volar forearm with mild soft tissue edema, no fluctuance, erythema, purulent drainage, foul odor. There is a well approximated laceration proximal to a larger one. The wound is c/d/i.  Psych: Age appropriate judgment and insight, normal affect and mood  Assessment and Plan: Laceration of right upper extremity, initial encounter  Very good approximation of the wound edges. TAO  BID to prevent infection, keep clean and dry. Warning signs and symptoms verbalized and written down. Okay to start washing with soap and water. No vigorous scrubbing. Do not use anything like peroxide. F/u for suture removal as instructed by ED. The patient voiced understanding and agreement to the plan.  [place], DO 08/11/16  10:05 AM

## 2016-08-11 NOTE — Patient Instructions (Signed)
Put some triple antibiotic ointment on area twice daily to help prevent infection.  When you do wash it, use only soap and water. Do not vigorously scrub. Keep the area clean and dry.   Things to look out for: increasing pain not relieved by ibuprofen/acetaminophen, fevers, spreading redness, drainage of pus, or foul odor.

## 2016-08-20 ENCOUNTER — Encounter: Payer: Self-pay | Admitting: Family Medicine

## 2016-08-20 ENCOUNTER — Ambulatory Visit (INDEPENDENT_AMBULATORY_CARE_PROVIDER_SITE_OTHER): Payer: Medicare Other | Admitting: Family Medicine

## 2016-08-20 VITALS — BP 110/50 | HR 62 | Temp 97.9°F | Ht 67.0 in | Wt 183.2 lb

## 2016-08-20 DIAGNOSIS — S4991XD Unspecified injury of right shoulder and upper arm, subsequent encounter: Secondary | ICD-10-CM | POA: Diagnosis not present

## 2016-08-20 NOTE — Progress Notes (Signed)
Chief Complaint  Patient presents with  . Suture / Staple Removal    pt states doing okay    Pt here for suture removal plcaed in ED. 15 horizontal mattress sutures and 7 simple sutures removed without issue. Wound stayed well approximated. No signs of infection.   BP (!) 110/50 (BP Location: Left Arm, Patient Position: Sitting, Cuff Size: Normal)   Pulse 62   Temp 97.9 F (36.6 C) (Oral)   Ht 5\' 7"  (1.702 m)   Wt 183 lb 3.2 oz (83.1 kg)   SpO2 98%   BMI 28.69 kg/m   Keep area clean and dry. OK to start light lifting. Let us know if any new developments arise. Pt voiced understanding and agreement to the plan.  [place], DO 08/20/16 10:12 AM

## 2016-09-01 ENCOUNTER — Telehealth: Payer: Self-pay | Admitting: *Deleted

## 2016-09-01 NOTE — Telephone Encounter (Signed)
AWV changed to 10/08/16 @830 

## 2016-09-02 ENCOUNTER — Telehealth: Payer: Self-pay | Admitting: Family Medicine

## 2016-09-02 NOTE — Telephone Encounter (Signed)
Self.   Refill for metoprolol succinate  Pharmacy: [facility] [address] [place]

## 2016-09-03 MED ORDER — METOPROLOL SUCCINATE ER 50 MG PO TB24: 50.0000 mg | ORAL_TABLET | Freq: Every day | ORAL | 1 refills | Status: DC

## 2016-09-03 NOTE — Telephone Encounter (Signed)
Rx approved and sent to the pharmacy by e-script.//AB/CMA 

## 2016-09-26 ENCOUNTER — Telehealth: Payer: Self-pay | Admitting: Family Medicine

## 2016-09-26 DIAGNOSIS — N400 Enlarged prostate without lower urinary tract symptoms: Secondary | ICD-10-CM

## 2016-09-26 MED ORDER — FINASTERIDE 5 MG PO TABS: 5.0000 mg | ORAL_TABLET | Freq: Every day | ORAL | 1 refills | Status: DC

## 2016-09-26 NOTE — Telephone Encounter (Signed)
Refill done and patient notified. 

## 2016-09-26 NOTE — Telephone Encounter (Signed)
°  Relation to PJ:RPZP Call back number:[phone] Pharmacy: [facility], [place] [phone] (Phone) [phone] (Fax)    Reason for call:  Patient requesting a 90 day supply of finasteride (PROSCAR) 5 MG tablet,please advise

## 2016-10-01 ENCOUNTER — Encounter: Payer: Medicare Other | Admitting: Physician Assistant

## 2016-10-01 ENCOUNTER — Ambulatory Visit: Payer: Medicare Other | Admitting: *Deleted

## 2016-10-01 NOTE — Progress Notes (Addendum)
Subjective:   Robert Pacheco is a 83 y.o. male who presents for Medicare Annual/Subsequent preventive examination.  Review of Systems:  No ROS.  Medicare Wellness Visit. Additional risk factors are reflected in the social history.  Cardiac Risk Factors include: advanced age (>18men, >22 women);dyslipidemia;hypertension;male gender;sedentary lifestyle Sleep patterns: wakes frequently to urinate. Home Safety/Smoke Alarms: Feels safe in home. Smoke alarms in place.  Living environment; residence and Adult nurse: lives with wife.   Seat Belt Safety/Bike Helmet: Wears seat belt.  Male:   PSA-  Lab Results  Component Value Date   PSA 2.81 09/11/2014   PSA 4.63 (H) 07/28/2013   PSA 4.30 (H) 07/06/2012       Objective:    Vitals: BP (!) 165/88 (BP Location: Left Arm, Cuff Size: Normal)   Pulse 61   Ht 5\' 7"  (1.702 m)   Wt 183 lb 9.6 oz (83.3 kg)   SpO2 99%   BMI 28.76 kg/m   Body mass index is 28.76 kg/m.  Tobacco History  Smoking Status  . Former Smoker  Smokeless Tobacco  . Never Used     Counseling given: Not Answered   Past Medical History:  Diagnosis Date  . Allergy   . Hyperlipidemia   . Hypertension   . Personal history of colonic adenomas 05/10/2012  . Skin cancer, basal cell    Past Surgical History:  Procedure Laterality Date  . CATARACT EXTRACTION    . COLONOSCOPY  multiple  . CORONARY ARTERY BYPASS GRAFT  2000  . EYE SURGERY    . HERNIA REPAIR    . MOHS SURGERY     Family History  Problem Relation Age of Onset  . Heart disease Father   . Colon cancer Brother 36  . Healthy Child        x 4  . Healthy Grandchild        x 11  . Colon cancer Sister 43  . Prostate cancer Brother    History  Sexual Activity  . Sexual activity: Not on file    Outpatient Encounter Prescriptions as of 10/08/2016  Medication Sig  . aspirin 81 MG chewable tablet Chew 81 mg by mouth.  . finasteride (PROSCAR) 5 MG tablet Take 1 tablet (5 mg total) by mouth  daily.  . fish oil-omega-3 fatty acids 1000 MG capsule Take 2 g by mouth daily.    [name] ketoconazole (NIZORAL) 2 % cream APPLY CREAM TOPICALLY TWICE DAILY  . metoprolol succinate (TOPROL-XL) 50 MG 24 hr tablet Take 1 tablet (50 mg total) by mouth daily. Take with or immediately following a meal. (Patient taking differently: Take 25 mg by mouth daily. Take with or immediately following a meal.)  . Saw Palmetto 450 MG CAPS Take 2 capsules (900 mg total) by mouth daily.  . simvastatin (ZOCOR) 20 MG tablet Take 1 tablet (20 mg total) by mouth at bedtime.  . docusate sodium (COLACE) 100 MG capsule Take 100 mg by mouth daily.  . nitroGLYCERIN (NITROSTAT) 0.4 MG SL tablet Place 1 tablet (0.4 mg total) under the tongue every 5 (five) minutes as needed. (Patient not taking: Reported on 10/08/2016)  . psyllium (METAMUCIL) 58.6 % packet Take 1 packet by mouth daily.   Facility-Administered Encounter Medications as of 10/08/2016  Medication  . 0.9 %  sodium chloride infusion    Activities of Daily Living In your present state of health, do you have any difficulty performing the following activities: 10/08/2016  Hearing? N  Vision?  N  Comment Wears glasses. Dr.Harris yearly.  Difficulty concentrating or making decisions? N  Walking or climbing stairs? N  Dressing or bathing? N  Doing errands, shopping? N  Preparing Food and eating ? N  Using the Toilet? N  In the past six months, have you accidently leaked urine? Y  Do you have problems with loss of bowel control? N  Managing your Medications? N  Managing your Finances? N  Housekeeping or managing your Housekeeping? N  Some recent data might be hidden    Patient Care Team: Shelda Pal, DO as PCP - General (Family Medicine)   Assessment:    Physical assessment deferred to PCP.  Exercise Activities and Dietary recommendations Current Exercise Habits: The patient does not participate in regular exercise at present, Exercise limited by:  None identified   Diet (meal preparation, eat out, water intake, caffeinated beverages, dairy products, fruits and vegetables): in general, a "healthy" diet      Goals      Patient Stated   . Maintain current health (pt-stated)      Fall Risk Fall Risk  10/08/2016 09/28/2015 09/11/2014  Falls in the past year? No Yes No  Number falls in past yr: - 1 -  Comment - Fell off stool in garage last week and bumped head. -  Injury with Fall? - No -   Depression Screen PHQ 2/9 Scores 10/08/2016 09/28/2015 09/11/2014  PHQ - 2 Score 0 0 0    Cognitive Function MMSE - Mini Mental State Exam 10/08/2016  Orientation to time 5  Orientation to Place 5  Registration 3  Attention/ Calculation 4  Recall 2  Language- name 2 objects 2  Language- repeat 1  Language- follow 3 step command 3  Language- read & follow direction 1  Write a sentence 1  Copy design 0  Total score 27        Immunization History  Administered Date(s) Administered  . Influenza Split 12/31/2010, 01/14/2012  . Influenza Whole 12/03/2006, 11/10/2007, 10/31/2008, 12/10/2009  . Influenza, High Dose Seasonal PF 12/15/2012, 12/13/2013  . Pneumococcal Conjugate-13 07/28/2013  . Pneumococcal Polysaccharide-23 01/27/2001, 03/02/2007  . Td 01/28/2004  . Tdap 09/11/2014   Screening Tests Health Maintenance  Topic Date Due  . INFLUENZA VACCINE  11/07/2016 (Originally 08/27/2016)  . TETANUS/TDAP  09/10/2024  . PNA vac Low Risk Adult  Completed      Plan:   Follow up with PCP today as scheduled.  Continue to eat heart healthy diet (full of fruits, vegetables, whole grains, lean protein, water--limit salt, fat, and sugar intake) and increase physical activity as tolerated.  Continue doing brain stimulating activities (puzzles, reading, adult coloring books, staying active) to keep memory sharp.     I have personally reviewed and noted the following in the patient's chart:   . Medical and social history . Use of alcohol,  tobacco or illicit drugs  . Current medications and supplements . Functional ability and status . Nutritional status . Physical activity . Advanced directives . List of other physicians . Hospitalizations, surgeries, and ER visits in previous 12 months . Vitals . Screenings to include cognitive, depression, and falls . Referrals and appointments  In addition, I have reviewed and discussed with patient certain preventive protocols, quality metrics, and best practice recommendations. A written personalized care plan for preventive services as well as general preventive health recommendations were provided to patient.     Shela Nevin, [place]  10/08/2016  Noted. Agree with  above. Shelda Pal, DO 10/08/16 9:30 AM

## 2016-10-08 ENCOUNTER — Ambulatory Visit (INDEPENDENT_AMBULATORY_CARE_PROVIDER_SITE_OTHER): Payer: Medicare Other | Admitting: Family Medicine

## 2016-10-08 ENCOUNTER — Encounter: Payer: Self-pay | Admitting: Family Medicine

## 2016-10-08 VITALS — BP 130/84 | HR 60 | Ht 67.0 in | Wt 183.6 lb

## 2016-10-08 DIAGNOSIS — I25701 Atherosclerosis of coronary artery bypass graft(s), unspecified, with angina pectoris with documented spasm: Secondary | ICD-10-CM

## 2016-10-08 DIAGNOSIS — I10 Essential (primary) hypertension: Secondary | ICD-10-CM

## 2016-10-08 DIAGNOSIS — R350 Frequency of micturition: Secondary | ICD-10-CM

## 2016-10-08 DIAGNOSIS — N401 Enlarged prostate with lower urinary tract symptoms: Secondary | ICD-10-CM | POA: Diagnosis not present

## 2016-10-08 DIAGNOSIS — E785 Hyperlipidemia, unspecified: Secondary | ICD-10-CM

## 2016-10-08 LAB — LIPID PANEL
CHOL/HDL RATIO: 2
CHOLESTEROL: 105 mg/dL (ref 0–200)
HDL: 46.3 mg/dL (ref >39.00)
LDL CALC: 40 mg/dL (ref 0–99)
NonHDL: 58.58
Triglycerides: 93 mg/dL (ref 0.0–149.0)
VLDL: 18.6 mg/dL (ref 0.0–40.0)

## 2016-10-08 LAB — COMPREHENSIVE METABOLIC PANEL
ALT: 13 U/L (ref 0–53)
AST: 17 U/L (ref 0–37)
Albumin: 4.1 g/dL (ref 3.5–5.2)
Alkaline Phosphatase: 76 U/L (ref 39–117)
BUN: 16 mg/dL (ref 6–23)
CHLORIDE: 106 meq/L (ref 96–112)
CO2: 26 meq/L (ref 19–32)
CREATININE: 1.08 mg/dL (ref 0.40–1.50)
Calcium: 9.3 mg/dL (ref 8.4–10.5)
GFR: 69.67 mL/min (ref >60.00)
GLUCOSE: 100 mg/dL — AB (ref 70–99)
POTASSIUM: 4.4 meq/L (ref 3.5–5.1)
SODIUM: 141 meq/L (ref 135–145)
Total Bilirubin: 0.6 mg/dL (ref 0.2–1.2)
Total Protein: 6.8 g/dL (ref 6.0–8.3)

## 2016-10-08 MED ORDER — METOPROLOL SUCCINATE ER 25 MG PO TB24: 25.0000 mg | ORAL_TABLET | Freq: Every day | ORAL | 3 refills | Status: DC

## 2016-10-08 MED ORDER — SIMVASTATIN 20 MG PO TABS: 20.0000 mg | ORAL_TABLET | Freq: Every day | ORAL | 3 refills | Status: DC

## 2016-10-08 NOTE — Patient Instructions (Signed)
Robert Pacheco , Thank you for taking time to come for your Medicare Wellness Visit. I appreciate your ongoing commitment to your health goals. Please review the following plan we discussed and let me know if I can assist you in the future.   These are the goals we discussed: Goals      Patient Stated   . Maintain current health (pt-stated)       This is a list of the screening recommended for you and due dates:  Health Maintenance  Topic Date Due  . Flu Shot  11/07/2016*  . Tetanus Vaccine  09/10/2024  . Pneumonia vaccines  Completed  *Topic was postponed. The date shown is not the original due date.   Continue to eat heart healthy diet (full of fruits, vegetables, whole grains, lean protein, water--limit salt, fat, and sugar intake) and increase physical activity as tolerated.  Continue doing brain stimulating activities (puzzles, reading, adult coloring books, staying active) to keep memory sharp.    Health Maintenance, Male A healthy lifestyle and preventive care is important for your health and wellness. Ask your health care provider about what schedule of regular examinations is right for you. What should I know about weight and diet? Eat a Healthy Diet  Eat plenty of vegetables, fruits, whole grains, low-fat dairy products, and lean protein.  Do not eat a lot of foods high in solid fats, added sugars, or salt.  Maintain a Healthy Weight Regular exercise can help you achieve or maintain a healthy weight. You should:  Do at least 150 minutes of exercise each week. The exercise should increase your heart rate and make you sweat (moderate-intensity exercise).  Do strength-training exercises at least twice a week.  Watch Your Levels of Cholesterol and Blood Lipids  Have your blood tested for lipids and cholesterol every 5 years starting at 81 years of age. If you are at high risk for heart disease, you should start having your blood tested when you are 81 years old. You may  need to have your cholesterol levels checked more often if: ? Your lipid or cholesterol levels are high. ? You are older than 81 years of age. ? You are at high risk for heart disease.  What should I know about cancer screening? Many types of cancers can be detected early and may often be prevented. Lung Cancer  You should be screened every year for lung cancer if: ? You are a current smoker who has smoked for at least 30 years. ? You are a former smoker who has quit within the past 15 years.  Talk to your health care provider about your screening options, when you should start screening, and how often you should be screened.  Colorectal Cancer  Routine colorectal cancer screening usually begins at 81 years of age and should be repeated every 5-10 years until you are 81 years old. You may need to be screened more often if early forms of precancerous polyps or small growths are found. Your health care provider may recommend screening at an earlier age if you have risk factors for colon cancer.  Your health care provider may recommend using home test kits to check for hidden blood in the stool.  A small camera at the end of a tube can be used to examine your colon (sigmoidoscopy or colonoscopy). This checks for the earliest forms of colorectal cancer.  Prostate and Testicular Cancer  Depending on your age and overall health, your health care provider may do  certain tests to screen for prostate and testicular cancer.  Talk to your health care provider about any symptoms or concerns you have about testicular or prostate cancer.  Skin Cancer  Check your skin from head to toe regularly.  Tell your health care provider about any new moles or changes in moles, especially if: ? There is a change in a mole's size, shape, or color. ? You have a mole that is larger than a pencil eraser.  Always use sunscreen. Apply sunscreen liberally and repeat throughout the day.  Protect yourself by  wearing long sleeves, pants, a wide-brimmed hat, and sunglasses when outside.  What should I know about heart disease, diabetes, and high blood pressure?  If you are 43-76 years of age, have your blood pressure checked every 3-5 years. If you are 62 years of age or older, have your blood pressure checked every year. You should have your blood pressure measured twice-once when you are at a hospital or clinic, and once when you are not at a hospital or clinic. Record the average of the two measurements. To check your blood pressure when you are not at a hospital or clinic, you can use: ? An automated blood pressure machine at a pharmacy. ? A home blood pressure monitor.  Talk to your health care provider about your target blood pressure.  If you are between 20-69 years old, ask your health care provider if you should take aspirin to prevent heart disease.  Have regular diabetes screenings by checking your fasting blood sugar level. ? If you are at a normal weight and have a low risk for diabetes, have this test once every three years after the age of 64. ? If you are overweight and have a high risk for diabetes, consider being tested at a younger age or more often.  A one-time screening for abdominal aortic aneurysm (AAA) by ultrasound is recommended for men aged 48-75 years who are current or former smokers. What should I know about preventing infection? Hepatitis B If you have a higher risk for hepatitis B, you should be screened for this virus. Talk with your health care provider to find out if you are at risk for hepatitis B infection. Hepatitis C Blood testing is recommended for:  Everyone born from [birth year] through 1963.  Anyone with known risk factors for hepatitis C.  Sexually Transmitted Diseases (STDs)  You should be screened each year for STDs including gonorrhea and chlamydia if: ? You are sexually active and are younger than 81 years of age. ? You are older than 81 years of age  and your health care provider tells you that you are at risk for this type of infection. ? Your sexual activity has changed since you were last screened and you are at an increased risk for chlamydia or gonorrhea. Ask your health care provider if you are at risk.  Talk with your health care provider about whether you are at high risk of being infected with HIV. Your health care provider may recommend a prescription medicine to help prevent HIV infection.  What else can I do?  Schedule regular health, dental, and eye exams.  Stay current with your vaccines (immunizations).  Do not use any tobacco products, such as cigarettes, chewing tobacco, and e-cigarettes. If you need help quitting, ask your health care provider.  Limit alcohol intake to no more than 2 drinks per day. One drink equals 12 ounces of beer, 5 ounces of wine, or 1 ounces of  hard liquor.  Do not use street drugs.  Do not share needles.  Ask your health care provider for help if you need support or information about quitting drugs.  Tell your health care provider if you often feel depressed.  Tell your health care provider if you have ever been abused or do not feel safe at home. This information is not intended to replace advice given to you by your health care provider. Make sure you discuss any questions you have with your health care provider. Document Released: 07/12/2007 Document Revised: 09/12/2015 Document Reviewed: 10/17/2014 Elsevier Interactive Patient Education  [employer].

## 2016-10-08 NOTE — Progress Notes (Signed)
Chief Complaint  Patient presents with  . Medicare Wellness    with RN    Subjective Robert Pacheco is a 83 y.o. male who presents for hypertension follow up. He does monitor home blood pressures. Blood pressures ranging from 130's/80's on average. He is on Toprol XL 50 mg daily- he has been cutting the pill in half and taking 1 daily. Patient has these side effects of medication: none He is not adhering to a healthy diet overall. Current exercise: walking, not as much as he would like  Dyslipidemia Patient presents for dyslipidemia follow up. Compliance with treatment thus far has been good. He denies myalgias. He is not adhering to a low sodium and low fat diet. The patient exercises intermittently.  The patient is known to have coexisting coronary artery disease.  BPH He is on Finasteride 5 mg daily and tolerating it well.  He will urinate 4 times per night. His old PCP was going to put him on a medicine in the past, but he decided not ot take it due to side effects.  He does not wish to change his dosage.    Past Medical History:  Diagnosis Date  . Allergy   . Coronary artery disease   . Hyperlipidemia   . Hypertension   . Personal history of colonic adenomas 05/10/2012  . Skin cancer, basal cell    Family History  Problem Relation Age of Onset  . Heart disease Father   . Colon cancer Brother 59  . Healthy Child        x 4  . Healthy Grandchild        x 11  . Colon cancer Sister 12  . Prostate cancer Brother      Medications Current Outpatient Prescriptions on File Prior to Visit  Medication Sig Dispense Refill  . aspirin 81 MG chewable tablet Chew 81 mg by mouth.    . finasteride (PROSCAR) 5 MG tablet Take 1 tablet (5 mg total) by mouth daily. 90 tablet 1  . fish oil-omega-3 fatty acids 1000 MG capsule Take 2 g by mouth daily.      [name] ketoconazole (NIZORAL) 2 % cream APPLY CREAM TOPICALLY TWICE DAILY 60 g 1  . metoprolol succinate (TOPROL-XL) 50 MG 24 hr  tablet Take 1 tablet (50 mg total) by mouth daily. Take with or immediately following a meal. (Patient taking differently: Take 25 mg by mouth daily. Take with or immediately following a meal.) 90 tablet 1  . Saw Palmetto 450 MG CAPS Take 2 capsules (900 mg total) by mouth daily.    . simvastatin (ZOCOR) 20 MG tablet Take 1 tablet (20 mg total) by mouth at bedtime. 90 tablet 0  . docusate sodium (COLACE) 100 MG capsule Take 100 mg by mouth daily.    . nitroGLYCERIN (NITROSTAT) 0.4 MG SL tablet Place 1 tablet (0.4 mg total) under the tongue every 5 (five) minutes as needed. (Patient not taking: Reported on 10/08/2016) 25 tablet 1  . psyllium (METAMUCIL) 58.6 % packet Take 1 packet by mouth daily.     Allergies Allergies  Allergen Reactions  . Other Nausea Only    UNKNOWN PAIN MED    Review of Systems Cardiovascular: no chest pain Respiratory:  no shortness of breath  Exam BP 130/84 (BP Location: Left Arm, Cuff Size: Normal) Comment: PCP aware and seeing pt today.  Pulse 60   Ht 5\' 7"  (1.702 m)   Wt 183 lb 9.6 oz (83.3 kg)  SpO2 99%   BMI 28.76 kg/m  General:  well developed, well nourished, in no apparent distress Skin:  warm, no pallor or diaphoresis Eyes:  pupils equal and round, sclera anicteric without injection Heart :RRR, no murmurs, no LE edema Lungs:  clear to auscultation, no accessory muscle use Psych: well oriented with normal range of affect and appropriate judgment/insight  Essential hypertension - Plan: metoprolol succinate (TOPROL-XL) 25 MG 24 hr tablet, Comprehensive metabolic panel  Hyperlipidemia, unspecified hyperlipidemia type - Plan: Lipid panel  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 Counseled on diet and exercise- doing pretty well. Will change XL dose so he is not  breaking apart a long acting medicine.  Cont Zocor w hx of CAD and good tolerance of medicine.  Cont Finasteride at current dose.  F/u in 6 mo. The patient voiced understanding and agreement to the plan.  [place], DO 10/08/16  9:26 AM

## 2017-02-26 ENCOUNTER — Telehealth: Payer: Self-pay | Admitting: Family Medicine

## 2017-02-26 NOTE — Telephone Encounter (Signed)
Copied from [place] [id number]. Topic: Quick Communication - See Telephone Encounter >> Feb 26, 2017 10:51 AM Cleaster Corin, NT wrote: CRM for notification. See Telephone encounter for:   02/26/17. Pt. Wife calling to let Dr. Nani Ravens that pt. Has received his flu shot at [facility] today. 02-26-2017

## 2017-02-26 NOTE — Telephone Encounter (Signed)
Flu shot documented

## 2017-03-24 ENCOUNTER — Other Ambulatory Visit: Payer: Self-pay | Admitting: Family Medicine

## 2017-03-24 DIAGNOSIS — N400 Enlarged prostate without lower urinary tract symptoms: Secondary | ICD-10-CM

## 2017-03-24 MED ORDER — FINASTERIDE 5 MG PO TABS: 5.0000 mg | ORAL_TABLET | Freq: Every day | ORAL | 0 refills | Status: DC

## 2017-03-24 NOTE — Telephone Encounter (Signed)
Copied from [place] [id number]. Topic: Quick Communication - Rx Refill/Question >> Mar 24, 2017 10:21 AM Burnis Medin, NT wrote: Medication: finasteride (PROSCAR) 5 MG tablet   Has the patient contacted their pharmacy? Yes    (Agent: If no, request that the patient contact the pharmacy for the refill.)   Preferred Pharmacy (with phone number or street name): [facility], [place] [phone] (Phone) [phone] (Fax)     Agent: Please be advised that RX refills may take up to 3 business days. We ask that you follow-up with your pharmacy.

## 2017-06-16 DIAGNOSIS — T8522XA Displacement of intraocular lens, initial encounter: Secondary | ICD-10-CM | POA: Insufficient documentation

## 2017-06-25 ENCOUNTER — Other Ambulatory Visit: Payer: Self-pay | Admitting: Family Medicine

## 2017-06-25 DIAGNOSIS — N400 Enlarged prostate without lower urinary tract symptoms: Secondary | ICD-10-CM

## 2017-08-01 ENCOUNTER — Other Ambulatory Visit: Payer: Self-pay | Admitting: Physician Assistant

## 2017-08-01 ENCOUNTER — Other Ambulatory Visit: Payer: Self-pay | Admitting: Family Medicine

## 2017-08-01 DIAGNOSIS — I25810 Atherosclerosis of coronary artery bypass graft(s) without angina pectoris: Secondary | ICD-10-CM

## 2017-08-03 NOTE — Telephone Encounter (Signed)
Will defer further refills of patient's medications to PCP  

## 2017-09-24 ENCOUNTER — Other Ambulatory Visit: Payer: Self-pay | Admitting: Family Medicine

## 2017-09-24 DIAGNOSIS — N400 Enlarged prostate without lower urinary tract symptoms: Secondary | ICD-10-CM

## 2017-10-03 ENCOUNTER — Encounter: Payer: Self-pay | Admitting: Emergency Medicine

## 2017-10-03 ENCOUNTER — Emergency Department (INDEPENDENT_AMBULATORY_CARE_PROVIDER_SITE_OTHER)
Admission: EM | Admit: 2017-10-03 | Discharge: 2017-10-03 | Disposition: A | Discharge status: Home / Self Care | Payer: Medicare Other | Source: Home / Self Care | Attending: Family Medicine | Admitting: Family Medicine

## 2017-10-03 DIAGNOSIS — L03116 Cellulitis of left lower limb: Secondary | ICD-10-CM

## 2017-10-03 DIAGNOSIS — L089 Local infection of the skin and subcutaneous tissue, unspecified: Secondary | ICD-10-CM | POA: Diagnosis not present

## 2017-10-03 DIAGNOSIS — T148XXA Other injury of unspecified body region, initial encounter: Secondary | ICD-10-CM

## 2017-10-03 MED ORDER — MUPIROCIN 2 % EX OINT: TOPICAL_OINTMENT | CUTANEOUS | 0 refills | Status: DC

## 2017-10-03 MED ORDER — DOXYCYCLINE HYCLATE 100 MG PO CAPS: 100.0000 mg | ORAL_CAPSULE | Freq: Two times a day (BID) | ORAL | 0 refills | Status: DC

## 2017-10-03 NOTE — ED Provider Notes (Signed)
[facility]    CSN: [id number] Arrival date & time: 10/03/17  1353     History   Chief Complaint Chief Complaint  Patient presents with  . Puncture Wound    HPI RODOLFO GASTER is a 84 y.o. male.   HPI  LUC SHAMMAS is a 84 y.o. male presenting to UC with c/o mild soreness with worsening redness and swelling to Left lower leg around a puncture wound from a goat bucking him 2 weeks ago.  Family encouraged him to be evaluated for a skin infection.  He has been applying Neosporin but no relief. Denies fever or chills.  Last Tdap 2016  Past Medical History:  Diagnosis Date  . Allergy   . Coronary artery disease   . Hyperlipidemia   . Hypertension   . Personal history of colonic adenomas 05/10/2012  . Skin cancer, basal cell     Patient Active Problem List   Diagnosis Date Noted  . Combined forms of age-related cataract of left eye 03/09/2014  . Regular astigmatism 03/09/2014  . Family history of colon cancer 10/01/2012  . History of colonic polyps 05/10/2012  . BPH (benign prostatic hyperplasia) 03/27/2009  . DYSPLASTIC NEVUS, [place] 10/05/2008  . CORONARY ATHEROSCLEROSIS OF ARTERY BYPASS GRAFT 03/02/2007  . Hyperlipidemia 02/22/2007  . Essential hypertension 02/22/2007    Past Surgical History:  Procedure Laterality Date  . CATARACT EXTRACTION    . COLONOSCOPY  multiple  . CORONARY ARTERY BYPASS GRAFT  2000  . EYE SURGERY    . HERNIA REPAIR    . MOHS SURGERY         Home Medications    Prior to Admission medications   Medication Sig Start Date End Date Taking? Authorizing Provider  aspirin 81 MG chewable tablet Chew 81 mg by mouth.    [provider]  docusate sodium (COLACE) 100 MG capsule Take 100 mg by mouth daily.    [provider]  doxycycline (VIBRAMYCIN) 100 MG capsule Take 1 capsule (100 mg total) by mouth 2 (two) times daily. One po bid x 7 days 10/03/17   Noe Gens, PA-C  finasteride (PROSCAR) 5 MG tablet TAKE 1  TABLET BY MOUTH ONCE DAILY 09/24/17   Shelda Pal, DO  fish oil-omega-3 fatty acids 1000 MG capsule Take 2 g by mouth daily.      [provider]  ketoconazole (NIZORAL) 2 % cream APPLY  CREAM TOPICALLY TWICE DAILY 08/04/17   Shelda Pal, DO  metoprolol succinate (TOPROL-XL) 25 MG 24 hr tablet Take 1 tablet (25 mg total) by mouth daily. 10/08/16   Shelda Pal, DO  mupirocin ointment (BACTROBAN) 2 % Apply to wound 3 times daily for 5 days 10/03/17   Noe Gens, PA-C  NITROSTAT 0.4 MG SL tablet DISSOLVE ONE TABLET UNDER THE TONGUE EVERY 5 MINUTES AS NEEDED. 08/03/17   Shelda Pal, DO  psyllium (METAMUCIL) 58.6 % packet Take 1 packet by mouth daily.    [provider]  Saw Palmetto 450 MG CAPS Take 2 capsules (900 mg total) by mouth daily. 10/12/15   Brunetta Jeans, PA-C  simvastatin (ZOCOR) 20 MG tablet Take 1 tablet (20 mg total) by mouth at bedtime. 10/08/16   Shelda Pal, DO    Family History Family History  Problem Relation Age of Onset  . Heart disease Father   . Colon cancer Brother 16  . Healthy Child  x 4  . Healthy Grandchild        x 11  . Colon cancer Sister 2  . Prostate cancer Brother     Social History Social History   Tobacco Use  . Smoking status: Former Research scientist (life sciences)  . Smokeless tobacco: Never Used  Substance Use Topics  . Alcohol use: No  . Drug use: No     Allergies   Other   Review of Systems Review of Systems  Constitutional: Negative for chills and fever.  Gastrointestinal: Negative for diarrhea, nausea and vomiting.  Skin: Positive for color change and wound.     Physical Exam Triage Vital Signs ED Triage Vitals  Enc Vitals Group     BP 10/03/17 1442 (!) 169/74     Pulse Rate 10/03/17 1442 66     Resp --      Temp 10/03/17 1442 97.8 F (36.6 C)     Temp Source 10/03/17 1442 Oral     SpO2 10/03/17 1442 99 %     Weight 10/03/17 1443 180 lb (81.6 kg)     Height --       Head Circumference --      Peak Flow --      Pain Score 10/03/17 1443 0     Pain Loc --      Pain Edu? --      Excl. in [place]? --    No data found.  Updated Vital Signs BP (!) 169/74 (BP Location: Right Arm)   Pulse 66   Temp 97.8 F (36.6 C) (Oral)   Wt 180 lb (81.6 kg)   SpO2 99%   BMI 28.19 kg/m   Visual Acuity Right Eye Distance:   Left Eye Distance:   Bilateral Distance:    Right Eye Near:   Left Eye Near:    Bilateral Near:     Physical Exam  Constitutional: He is oriented to person, place, and time. He appears well-developed and well-nourished.  HENT:  Head: Normocephalic and atraumatic.  Eyes: EOM are normal.  Neck: Normal range of motion.  Cardiovascular: Normal rate.  Pulmonary/Chest: Effort normal.  Musculoskeletal: Normal range of motion. He exhibits edema and tenderness.       Legs: Left lower leg: mild edema. Tenderness. Muscle compartment is soft. Full ROM knee and ankle. (see skin exam)  Neurological: He is alert and oriented to person, place, and time.  Skin: Skin is warm and dry. There is erythema.  Left lower leg: 4cm area of erythema with centralized blackened scab. Mild edema. Tender. No bleeding or drainage.   Psychiatric: He has a normal mood and affect. His behavior is normal.  Nursing note and vitals reviewed.    UC Treatments / Results  Labs (all labs ordered are listed, but only abnormal results are displayed) Labs Reviewed - No data to display  EKG None  Radiology No results found.  Procedures Procedures (including critical care time)  Medications Ordered in UC Medications - No data to display  Initial Impression / Assessment and Plan / UC Course  I have reviewed the triage vital signs and the nursing notes.  Pertinent labs & imaging results that were available during my care of the patient were reviewed by me and considered in my medical decision making (see chart for details).     Hx and exam c/w cellulitis. Will  tx with oral and topical antibiotics Home care instructions provided.  Final Clinical Impressions(s) / UC Diagnoses   Final diagnoses:  Infected puncture wound  Cellulitis of left lower leg     Discharge Instructions      Keep wound clean with warm water and soap. Pat dry and apply the prescribed antibiotic ointment 3 times daily for 5 days.  Please take the doxycycline, antibiotic, one tab twice daily as prescribed to help with the infection.  It is recommended that you follow up in 4-5 days if not improving, sooner if significantly worsening.     ED Prescriptions    Medication Sig Dispense Auth. Provider   doxycycline (VIBRAMYCIN) 100 MG capsule Take 1 capsule (100 mg total) by mouth 2 (two) times daily. One po bid x 7 days 14 capsule Gerarda Fraction, Kerri-Anne Haeberle O, PA-C   mupirocin ointment (BACTROBAN) 2 % Apply to wound 3 times daily for 5 days 22 g Noe Gens, [place]     Controlled Substance Prescriptions [place] Controlled Substance Registry consulted? Not Applicable   Tyrell Antonio 10/04/17 1427

## 2017-10-03 NOTE — ED Triage Notes (Signed)
Pt states a goat bucked his left lower leg with horns about 2 weeks ago. Denies pain but wants to check for infection.

## 2017-10-03 NOTE — Discharge Instructions (Signed)
°  Keep wound clean with warm water and soap. Pat dry and apply the prescribed antibiotic ointment 3 times daily for 5 days.  Please take the doxycycline, antibiotic, one tab twice daily as prescribed to help with the infection.  It is recommended that you follow up in 4-5 days if not improving, sooner if significantly worsening.

## 2017-10-07 NOTE — Progress Notes (Deleted)
Subjective:   Robert Pacheco is a 84 y.o. male who presents for Medicare Annual/Subsequent preventive examination.  Review of Systems: No ROS.  Medicare Wellness Visit. Additional risk factors are reflected in the social history.    Sleep patterns:   Home Safety/Smoke Alarms: Feels safe in home. Smoke alarms in place.   Male:     PSA-  Lab Results  Component Value Date   PSA 2.81 09/11/2014   PSA 4.63 (H) 07/28/2013   PSA 4.30 (H) 07/06/2012       Objective:    Vitals: There were no vitals taken for this visit.  There is no height or weight on file to calculate BMI.  Advanced Directives 10/08/2016 08/09/2016 08/07/2016 10/15/2015  Does Patient Have a Medical Advance Directive? Yes No Yes Yes  Type of Paramedic of [place];Living will - Living will [place];Living will    Tobacco Social History   Tobacco Use  Smoking Status Former Smoker  Smokeless Tobacco Never Used     Counseling given: Not Answered   Clinical Intake:                       Past Medical History:  Diagnosis Date  . Allergy   . Coronary artery disease   . Hyperlipidemia   . Hypertension   . Personal history of colonic adenomas 05/10/2012  . Skin cancer, basal cell    Past Surgical History:  Procedure Laterality Date  . CATARACT EXTRACTION    . COLONOSCOPY  multiple  . CORONARY ARTERY BYPASS GRAFT  2000  . EYE SURGERY    . HERNIA REPAIR    . MOHS SURGERY     Family History  Problem Relation Age of Onset  . Heart disease Father   . Colon cancer Brother 55  . Healthy Child        x 4  . Healthy Grandchild        x 11  . Colon cancer Sister 46  . Prostate cancer Brother    Social History   Socioeconomic History  . Marital status: Married    Spouse name: Not on file  . Number of children: 4  . Years of education: Not on file  . Highest education level: Not on file  Occupational History    Comment: Education administrator    Social Needs  . Financial resource strain: Not on file  . Food insecurity:    Worry: Not on file    Inability: Not on file  . Transportation needs:    Medical: Not on file    Non-medical: Not on file  Tobacco Use  . Smoking status: Former Research scientist (life sciences)  . Smokeless tobacco: Never Used  Substance and Sexual Activity  . Alcohol use: No  . Drug use: No  . Sexual activity: Not on file  Lifestyle  . Physical activity:    Days per week: Not on file    Minutes per session: Not on file  . Stress: Not on file  Relationships  . Social connections:    Talks on phone: Not on file    Gets together: Not on file    Attends religious service: Not on file    Active member of club or organization: Not on file    Attends meetings of clubs or organizations: Not on file    Relationship status: Not on file  Other Topics Concern  . Not on file  Social History  Narrative  . Not on file    Outpatient Encounter Medications as of 10/09/2017  Medication Sig  . aspirin 81 MG chewable tablet Chew 81 mg by mouth.  . docusate sodium (COLACE) 100 MG capsule Take 100 mg by mouth daily.  [name] doxycycline (VIBRAMYCIN) 100 MG capsule Take 1 capsule (100 mg total) by mouth 2 (two) times daily. One po bid x 7 days  . finasteride (PROSCAR) 5 MG tablet TAKE 1 TABLET BY MOUTH ONCE DAILY  . fish oil-omega-3 fatty acids 1000 MG capsule Take 2 g by mouth daily.    [name] ketoconazole (NIZORAL) 2 % cream APPLY  CREAM TOPICALLY TWICE DAILY  . metoprolol succinate (TOPROL-XL) 25 MG 24 hr tablet Take 1 tablet (25 mg total) by mouth daily.  . mupirocin ointment (BACTROBAN) 2 % Apply to wound 3 times daily for 5 days  . NITROSTAT 0.4 MG SL tablet DISSOLVE ONE TABLET UNDER THE TONGUE EVERY 5 MINUTES AS NEEDED.  [name] psyllium (METAMUCIL) 58.6 % packet Take 1 packet by mouth daily.  . Saw Palmetto 450 MG CAPS Take 2 capsules (900 mg total) by mouth daily.  . simvastatin (ZOCOR) 20 MG tablet Take 1 tablet (20 mg total) by mouth at bedtime.    Facility-Administered Encounter Medications as of 10/09/2017  Medication  . 0.9 %  sodium chloride infusion    Activities of Daily Living In your present state of health, do you have any difficulty performing the following activities: 10/08/2016  Hearing? N  Vision? N  Comment Wears glasses. Dr.Harris yearly.  Difficulty concentrating or making decisions? N  Walking or climbing stairs? N  Dressing or bathing? N  Doing errands, shopping? N  Preparing Food and eating ? N  Using the Toilet? N  In the past six months, have you accidently leaked urine? Y  Do you have problems with loss of bowel control? N  Managing your Medications? N  Managing your Finances? N  Housekeeping or managing your Housekeeping? N  Some recent data might be hidden    Patient Care Team: Shelda Pal, DO as PCP - General (Family Medicine)   Assessment:   This is a routine wellness examination for Robert Pacheco. Physical assessment deferred to PCP.  Exercise Activities and Dietary recommendations   Diet (meal preparation, eat out, water intake, caffeinated beverages, dairy products, fruits and vegetables): {Desc; diets:16563} Breakfast: Lunch:  Dinner:      Goals    . Maintain current health (pt-stated)       Fall Risk Fall Risk  10/08/2016 09/28/2015 09/11/2014  Falls in the past year? No Yes No  Number falls in past yr: - 1 -  Comment - Fell off stool in garage last week and bumped head. -  Injury with Fall? - No -   Depression Screen PHQ 2/9 Scores 10/08/2016 09/28/2015 09/11/2014  PHQ - 2 Score 0 0 0    Cognitive Function MMSE - Mini Mental State Exam 10/08/2016  Orientation to time 5  Orientation to Place 5  Registration 3  Attention/ Calculation 4  Recall 2  Language- name 2 objects 2  Language- repeat 1  Language- follow 3 step command 3  Language- read & follow direction 1  Write a sentence 1  Copy design 0  Total score 27        Immunization History  Administered Date(s)  Administered  . Influenza Split 12/31/2010, 01/14/2012  . Influenza Whole 12/03/2006, 11/10/2007, 10/31/2008, 12/10/2009  . Influenza, High Dose Seasonal PF 12/15/2012,  12/13/2013  . Influenza-Unspecified 02/26/2017  . Pneumococcal Conjugate-13 07/28/2013  . Pneumococcal Polysaccharide-23 01/27/2001, 03/02/2007  . Td 01/28/2004  . Tdap 09/11/2014    Screening Tests Health Maintenance  Topic Date Due  . INFLUENZA VACCINE  08/27/2017  . TETANUS/TDAP  09/10/2024  . PNA vac Low Risk Adult  Completed       Plan:   ***  I have personally reviewed and noted the following in the patient's chart:   . Medical and social history . Use of alcohol, tobacco or illicit drugs  . Current medications and supplements . Functional ability and status . Nutritional status . Physical activity . Advanced directives . List of other physicians . Hospitalizations, surgeries, and ER visits in previous 12 months . Vitals . Screenings to include cognitive, depression, and falls . Referrals and appointments  In addition, I have reviewed and discussed with patient certain preventive protocols, quality metrics, and best practice recommendations. A written personalized care plan for preventive services as well as general preventive health recommendations were provided to patient.     Shela Nevin, [place]  10/07/2017

## 2017-10-09 ENCOUNTER — Ambulatory Visit: Payer: Medicare Other | Admitting: *Deleted

## 2017-10-09 NOTE — Progress Notes (Signed)
Subjective:   Robert Pacheco is a 84 y.o. male who presents for Medicare Annual/Subsequent preventive examination.  Review of Systems: No ROS.  Medicare Wellness Visit. Additional risk factors are reflected in the social history. Cardiac Risk Factors include: advanced age (>42men, >46 women);male gender;dyslipidemia;hypertension Sleep patterns: Sleeps about 7 hrs. Home Safety/Smoke Alarms: Feels safe in home. Smoke alarms in place. Lives in 1 story home with wife.  Male:       PSA-  Lab Results  Component Value Date   PSA 2.81 09/11/2014   PSA 4.63 (H) 07/28/2013   PSA 4.30 (H) 07/06/2012       Objective:    Vitals: BP 120/70 (BP Location: Left Arm, Patient Position: Sitting, Cuff Size: Normal)   Pulse 65   Ht 5\' 7"  (1.702 m)   Wt 182 lb 12.8 oz (82.9 kg)   SpO2 97%   BMI 28.63 kg/m   Body mass index is 28.63 kg/m.  Advanced Directives 10/12/2017 10/08/2016 08/09/2016 08/07/2016 10/15/2015  Does Patient Have a Medical Advance Directive? No Yes No Yes Yes  Type of Advance Directive - [place];Living will - Living will [place];Living will  Would patient like information on creating a medical advance directive? No - Patient declined - - - -    Tobacco Social History   Tobacco Use  Smoking Status Former Smoker  Smokeless Tobacco Never Used     Counseling given: Not Answered   Clinical Intake: Pain : No/denies pain   Past Medical History:  Diagnosis Date  . Allergy   . Coronary artery disease   . Hyperlipidemia   . Hypertension   . Personal history of colonic adenomas 05/10/2012  . Skin cancer, basal cell    Past Surgical History:  Procedure Laterality Date  . CATARACT EXTRACTION    . COLONOSCOPY  multiple  . CORONARY ARTERY BYPASS GRAFT  2000  . EYE SURGERY    . HERNIA REPAIR    . MOHS SURGERY     Family History  Problem Relation Age of Onset  . Heart disease Father   . Colon cancer Brother 87  . Healthy Child          x 4  . Healthy Grandchild        x 11  . Colon cancer Sister 69  . Prostate cancer Brother    Social History   Socioeconomic History  . Marital status: Married    Spouse name: Not on file  . Number of children: 4  . Years of education: Not on file  . Highest education level: Not on file  Occupational History    Comment: Education administrator  Social Needs  . Financial resource strain: Not on file  . Food insecurity:    Worry: Not on file    Inability: Not on file  . Transportation needs:    Medical: Not on file    Non-medical: Not on file  Tobacco Use  . Smoking status: Former Research scientist (life sciences)  . Smokeless tobacco: Never Used  Substance and Sexual Activity  . Alcohol use: No  . Drug use: No  . Sexual activity: Not on file  Lifestyle  . Physical activity:    Days per week: Not on file    Minutes per session: Not on file  . Stress: Not on file  Relationships  . Social connections:    Talks on phone: Not on file    Gets together: Not on file  Attends religious service: Not on file    Active member of club or organization: Not on file    Attends meetings of clubs or organizations: Not on file    Relationship status: Not on file  Other Topics Concern  . Not on file  Social History Narrative  . Not on file    Outpatient Encounter Medications as of 10/12/2017  Medication Sig  . aspirin 81 MG chewable tablet Chew 81 mg by mouth.  . finasteride (PROSCAR) 5 MG tablet TAKE 1 TABLET BY MOUTH ONCE DAILY  . fish oil-omega-3 fatty acids 1000 MG capsule Take 2 g by mouth daily.    [name] ketoconazole (NIZORAL) 2 % cream APPLY  CREAM TOPICALLY TWICE DAILY  . metoprolol succinate (TOPROL-XL) 25 MG 24 hr tablet Take 1 tablet (25 mg total) by mouth daily.  . mupirocin ointment (BACTROBAN) 2 % Apply to wound 3 times daily for 5 days  . NITROSTAT 0.4 MG SL tablet DISSOLVE ONE TABLET UNDER THE TONGUE EVERY 5 MINUTES AS NEEDED.  [name] psyllium (METAMUCIL) 58.6 % packet Take 1 packet by mouth  daily.  . simvastatin (ZOCOR) 20 MG tablet Take 1 tablet (20 mg total) by mouth at bedtime.  . docusate sodium (COLACE) 100 MG capsule Take 100 mg by mouth daily.  . Saw Palmetto 450 MG CAPS Take 2 capsules (900 mg total) by mouth daily. (Patient not taking: Reported on 10/12/2017)  . [DISCONTINUED] doxycycline (VIBRAMYCIN) 100 MG capsule Take 1 capsule (100 mg total) by mouth 2 (two) times daily. One po bid x 7 days   Facility-Administered Encounter Medications as of 10/12/2017  Medication  . 0.9 %  sodium chloride infusion    Activities of Daily Living In your present state of health, do you have any difficulty performing the following activities: 10/12/2017  Hearing? N  Vision? N  Difficulty concentrating or making decisions? N  Walking or climbing stairs? N  Dressing or bathing? N  Doing errands, shopping? N  Preparing Food and eating ? N  Using the Toilet? N  In the past six months, have you accidently leaked urine? N  Do you have problems with loss of bowel control? N  Managing your Medications? N  Managing your Finances? N  Housekeeping or managing your Housekeeping? N  Some recent data might be hidden    Patient Care Team: Shelda Pal, DO as PCP - General (Family Medicine)   Assessment:   This is a routine wellness examination for [name]. Physical assessment deferred to PCP.  Exercise Activities and Dietary recommendations Current Exercise Habits: The patient does not participate in regular exercise at present, Exercise limited by: None identified   Diet (meal preparation, eat out, water intake, caffeinated beverages, dairy products, fruits and vegetables): 24 hr recall Breakfast: cereal , coffee Lunch: tomato sandwich, chip, tea Dinner: potato salad, milk , and light bread  Goals    . Maintain current health (pt-stated)       Fall Risk Fall Risk  10/12/2017 10/08/2016 09/28/2015 09/11/2014  Falls in the past year? No No Yes No  Number falls in past yr: -  - 1 -  Comment - - Fell off stool in garage last week and bumped head. -  Injury with Fall? - - No -   Depression Screen PHQ 2/9 Scores 10/12/2017 10/08/2016 09/28/2015 09/11/2014  PHQ - 2 Score 0 0 0 0    Cognitive Function MMSE - Mini Mental State Exam 10/12/2017 10/08/2016  Orientation to time 5  5  Orientation to Place 5 5  Registration 3 3  Attention/ Calculation 4 4  Recall 1 2  Language- name 2 objects 2 2  Language- repeat 1 1  Language- follow 3 step command 3 3  Language- read & follow direction 1 1  Write a sentence 1 1  Copy design 1 0  Total score 27 27        Immunization History  Administered Date(s) Administered  . Influenza Split 12/31/2010, 01/14/2012  . Influenza Whole 12/03/2006, 11/10/2007, 10/31/2008, 12/10/2009  . Influenza, High Dose Seasonal PF 12/15/2012, 12/13/2013  . Influenza-Unspecified 02/26/2017  . Pneumococcal Conjugate-13 07/28/2013  . Pneumococcal Polysaccharide-23 01/27/2001, 03/02/2007  . Td 01/28/2004  . Tdap 09/11/2014   Screening Tests Health Maintenance  Topic Date Due  . INFLUENZA VACCINE  08/27/2017  . TETANUS/TDAP  09/10/2024  . PNA vac Low Risk Adult  Completed        Plan:   Follow up PCP today as scheduled.  Please schedule your next medicare wellness visit with me in 1 yr.  Continue to eat heart healthy diet (full of fruits, vegetables, whole grains, lean protein, water--limit salt, fat, and sugar intake) and increase physical activity as tolerated.   I have personally reviewed and noted the following in the patient's chart:   . Medical and social history . Use of alcohol, tobacco or illicit drugs  . Current medications and supplements . Functional ability and status . Nutritional status . Physical activity . Advanced directives . List of other physicians . Hospitalizations, surgeries, and ER visits in previous 12 months . Vitals . Screenings to include cognitive, depression, and falls . Referrals and  appointments  In addition, I have reviewed and discussed with patient certain preventive protocols, quality metrics, and best practice recommendations. A written personalized care plan for preventive services as well as general preventive health recommendations were provided to patient.     Shela Nevin, [place]  10/12/2017

## 2017-10-12 ENCOUNTER — Ambulatory Visit (INDEPENDENT_AMBULATORY_CARE_PROVIDER_SITE_OTHER): Payer: Medicare Other | Admitting: Family Medicine

## 2017-10-12 ENCOUNTER — Ambulatory Visit (INDEPENDENT_AMBULATORY_CARE_PROVIDER_SITE_OTHER): Payer: Medicare Other | Admitting: *Deleted

## 2017-10-12 ENCOUNTER — Encounter: Payer: Self-pay | Admitting: *Deleted

## 2017-10-12 ENCOUNTER — Encounter: Payer: Self-pay | Admitting: Family Medicine

## 2017-10-12 VITALS — BP 120/70 | HR 65 | Ht 67.0 in | Wt 182.8 lb

## 2017-10-12 VITALS — BP 122/68 | HR 68 | Temp 98.2°F | Ht 67.0 in | Wt 182.5 lb

## 2017-10-12 DIAGNOSIS — Z0000 Encounter for general adult medical examination without abnormal findings: Secondary | ICD-10-CM

## 2017-10-12 DIAGNOSIS — I10 Essential (primary) hypertension: Secondary | ICD-10-CM

## 2017-10-12 DIAGNOSIS — R234 Changes in skin texture: Secondary | ICD-10-CM | POA: Diagnosis not present

## 2017-10-12 DIAGNOSIS — Z09 Encounter for follow-up examination after completed treatment for conditions other than malignant neoplasm: Secondary | ICD-10-CM

## 2017-10-12 DIAGNOSIS — I25701 Atherosclerosis of coronary artery bypass graft(s), unspecified, with angina pectoris with documented spasm: Secondary | ICD-10-CM | POA: Diagnosis not present

## 2017-10-12 DIAGNOSIS — W5532XD Struck by other hoof stock, subsequent encounter: Secondary | ICD-10-CM

## 2017-10-12 MED ORDER — METOPROLOL SUCCINATE ER 25 MG PO TB24: 25.0000 mg | ORAL_TABLET | Freq: Every day | ORAL | 3 refills | Status: DC

## 2017-10-12 MED ORDER — SIMVASTATIN 20 MG PO TABS: 20.0000 mg | ORAL_TABLET | Freq: Every day | ORAL | 3 refills | Status: DC

## 2017-10-12 NOTE — Progress Notes (Signed)
Chief Complaint  Patient presents with  . Follow-up    Subjective Robert Pacheco is a 84 y.o. male who presents for hypertension follow up. He does monitor home blood pressures, runs at 120-130's/60-70's. He is compliant with medication- metoprolol XL 25 mg/d. Patient has these side effects of medication: none He is adhering to a healthy diet overall. Current exercise: active with yardwork  Has a history of coronary artery disease.  Compliant with both aspirin and Zocor.  No side effects.  Exercise/diet habits as above.  Denies any chest pain or shortness of breath.  Has not used nitroglycerin since last visit.  2 weeks ago got horned in L leg by a goat. Went to UC 1 week later and got cream and abx.  Having some pain and it is still slightly red, no excessive warmth, drainage, fevers, or foul odor.   Past Medical History:  Diagnosis Date  . Allergy   . Coronary artery disease   . Hyperlipidemia   . Hypertension   . Personal history of colonic adenomas 05/10/2012  . Skin cancer, basal cell     Review of Systems Cardiovascular: no chest pain Respiratory:  no shortness of breath  Exam BP 122/68 (BP Location: Left Arm, Patient Position: Sitting, Cuff Size: Normal)   Pulse 68   Temp 98.2 F (36.8 C) (Oral)   Ht 5\' 7"  (1.702 m)   Wt 182 lb 8 oz (82.8 kg)   SpO2 96%   BMI 28.58 kg/m  General:  well developed, well nourished, in no apparent distress Heart: RRR, no bruits, no LE edema Lungs: clear to auscultation, no accessory muscle use Skin: There is a scab on the anterior portion of the left lower extremity.  There is mild hyperemia surrounding this area.  Lower extremity edema is noted diffusely throughout the lower extremity.  There is no excessive warmth, fluctuance, tenderness to palpation, or streaking. 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 Counseled on diet and exercise. Would not do anything for skin, will continue to heal.  He is up-to-date with his tetanus shot. F/u in 6 months. The patient voiced understanding and agreement to the plan.  [place], DO 10/12/17  5:14 PM

## 2017-10-12 NOTE — Progress Notes (Signed)
Noted. Agree with above. PSA screening not rec'd in his age range.   [place], DO 10/12/17 5:16 PM

## 2017-10-12 NOTE — Patient Instructions (Addendum)
Give [place] 2-3 business days to get the results of your labs back.   You can check your blood pressure as much or as little as you want.   Keep the diet clean. Stay active.  Things to look out for: increasing pain not relieved by ibuprofen/acetaminophen, fevers, spreading redness, drainage of pus, or foul odor.  Let us know if you need anything.

## 2017-10-12 NOTE — Patient Instructions (Signed)
Follow up PCP today as scheduled.  Please schedule your next medicare wellness visit with me in 1 yr.  Continue to eat heart healthy diet (full of fruits, vegetables, whole grains, lean protein, water--limit salt, fat, and sugar intake) and increase physical activity as tolerated.   Mr. Aldrete , Thank you for taking time to come for your Medicare Wellness Visit. I appreciate your ongoing commitment to your health goals. Please review the following plan we discussed and let me know if I can assist you in the future.   These are the goals we discussed: Goals    . Maintain current health (pt-stated)       This is a list of the screening recommended for you and due dates:  Health Maintenance  Topic Date Due  . Flu Shot  08/27/2017  . Tetanus Vaccine  09/10/2024  . Pneumonia vaccines  Completed    Health Maintenance, Male A healthy lifestyle and preventive care is important for your health and wellness. Ask your health care provider about what schedule of regular examinations is right for you. What should I know about weight and diet? Eat a Healthy Diet  Eat plenty of vegetables, fruits, whole grains, low-fat dairy products, and lean protein.  Do not eat a lot of foods high in solid fats, added sugars, or salt.  Maintain a Healthy Weight Regular exercise can help you achieve or maintain a healthy weight. You should:  Do at least 150 minutes of exercise each week. The exercise should increase your heart rate and make you sweat (moderate-intensity exercise).  Do strength-training exercises at least twice a week.  Watch Your Levels of Cholesterol and Blood Lipids  Have your blood tested for lipids and cholesterol every 5 years starting at 82 years of age. If you are at high risk for heart disease, you should start having your blood tested when you are 82 years old. You may need to have your cholesterol levels checked more often if: ? Your lipid or cholesterol levels are high. ? You  are older than 82 years of age. ? You are at high risk for heart disease.  What should I know about cancer screening? Many types of cancers can be detected early and may often be prevented. Lung Cancer  You should be screened every year for lung cancer if: ? You are a current smoker who has smoked for at least 30 years. ? You are a former smoker who has quit within the past 15 years.  Talk to your health care provider about your screening options, when you should start screening, and how often you should be screened.  Colorectal Cancer  Routine colorectal cancer screening usually begins at 82 years of age and should be repeated every 5-10 years until you are 82 years old. You may need to be screened more often if early forms of precancerous polyps or small growths are found. Your health care provider may recommend screening at an earlier age if you have risk factors for colon cancer.  Your health care provider may recommend using home test kits to check for hidden blood in the stool.  A small camera at the end of a tube can be used to examine your colon (sigmoidoscopy or colonoscopy). This checks for the earliest forms of colorectal cancer.  Prostate and Testicular Cancer  Depending on your age and overall health, your health care provider may do certain tests to screen for prostate and testicular cancer.  Talk to your health care provider  about any symptoms or concerns you have about testicular or prostate cancer.  Skin Cancer  Check your skin from head to toe regularly.  Tell your health care provider about any new moles or changes in moles, especially if: ? There is a change in a mole's size, shape, or color. ? You have a mole that is larger than a pencil eraser.  Always use sunscreen. Apply sunscreen liberally and repeat throughout the day.  Protect yourself by wearing long sleeves, pants, a wide-brimmed hat, and sunglasses when outside.  What should I know about heart  disease, diabetes, and high blood pressure?  If you are 81-51 years of age, have your blood pressure checked every 3-5 years. If you are 13 years of age or older, have your blood pressure checked every year. You should have your blood pressure measured twice-once when you are at a hospital or clinic, and once when you are not at a hospital or clinic. Record the average of the two measurements. To check your blood pressure when you are not at a hospital or clinic, you can use: ? An automated blood pressure machine at a pharmacy. ? A home blood pressure monitor.  Talk to your health care provider about your target blood pressure.  If you are between 37-81 years old, ask your health care provider if you should take aspirin to prevent heart disease.  Have regular diabetes screenings by checking your fasting blood sugar level. ? If you are at a normal weight and have a low risk for diabetes, have this test once every three years after the age of 68. ? If you are overweight and have a high risk for diabetes, consider being tested at a younger age or more often.  A one-time screening for abdominal aortic aneurysm (AAA) by ultrasound is recommended for men aged 29-75 years who are current or former smokers. What should I know about preventing infection? Hepatitis B If you have a higher risk for hepatitis B, you should be screened for this virus. Talk with your health care provider to find out if you are at risk for hepatitis B infection. Hepatitis C Blood testing is recommended for:  Everyone born from [birth year] through 1963.  Anyone with known risk factors for hepatitis C.  Sexually Transmitted Diseases (STDs)  You should be screened each year for STDs including gonorrhea and chlamydia if: ? You are sexually active and are younger than 82 years of age. ? You are older than 82 years of age and your health care provider tells you that you are at risk for this type of infection. ? Your sexual activity  has changed since you were last screened and you are at an increased risk for chlamydia or gonorrhea. Ask your health care provider if you are at risk.  Talk with your health care provider about whether you are at high risk of being infected with HIV. Your health care provider may recommend a prescription medicine to help prevent HIV infection.  What else can I do?  Schedule regular health, dental, and eye exams.  Stay current with your vaccines (immunizations).  Do not use any tobacco products, such as cigarettes, chewing tobacco, and e-cigarettes. If you need help quitting, ask your health care provider.  Limit alcohol intake to no more than 2 drinks per day. One drink equals 12 ounces of beer, 5 ounces of wine, or 1 ounces of hard liquor.  Do not use street drugs.  Do not share needles.  Ask your  health care provider for help if you need support or information about quitting drugs.  Tell your health care provider if you often feel depressed.  Tell your health care provider if you have ever been abused or do not feel safe at home. This information is not intended to replace advice given to you by your health care provider. Make sure you discuss any questions you have with your health care provider. Document Released: 07/12/2007 Document Revised: 09/12/2015 Document Reviewed: 10/17/2014 Elsevier Interactive Patient Education  [employer].

## 2017-10-12 NOTE — Progress Notes (Signed)
Pre visit review using our clinic review tool, if applicable. No additional management support is needed unless otherwise documented below in the visit note. 

## 2017-10-13 LAB — COMPREHENSIVE METABOLIC PANEL
ALBUMIN: 4.1 g/dL (ref 3.5–5.2)
ALT: 10 U/L (ref 0–53)
AST: 16 U/L (ref 0–37)
Alkaline Phosphatase: 82 U/L (ref 39–117)
BUN: 15 mg/dL (ref 6–23)
CALCIUM: 9.3 mg/dL (ref 8.4–10.5)
CHLORIDE: 105 meq/L (ref 96–112)
CO2: 26 mEq/L (ref 19–32)
Creatinine, Ser: 1 mg/dL (ref 0.40–1.50)
GFR: 75.95 mL/min (ref >60.00)
Glucose, Bld: 92 mg/dL (ref 70–99)
POTASSIUM: 4.8 meq/L (ref 3.5–5.1)
SODIUM: 140 meq/L (ref 135–145)
Total Bilirubin: 0.6 mg/dL (ref 0.2–1.2)
Total Protein: 6.7 g/dL (ref 6.0–8.3)

## 2017-10-13 LAB — LIPID PANEL
CHOLESTEROL: 105 mg/dL (ref 0–200)
HDL: 32.6 mg/dL — ABNORMAL LOW (ref >39.00)
LDL Cholesterol: 46 mg/dL (ref 0–99)
NonHDL: 72.3
TRIGLYCERIDES: 133 mg/dL (ref 0.0–149.0)
Total CHOL/HDL Ratio: 3
VLDL: 26.6 mg/dL (ref 0.0–40.0)

## 2017-12-25 ENCOUNTER — Other Ambulatory Visit: Payer: Self-pay | Admitting: Family Medicine

## 2017-12-25 DIAGNOSIS — N400 Enlarged prostate without lower urinary tract symptoms: Secondary | ICD-10-CM

## 2018-02-11 ENCOUNTER — Encounter: Payer: Self-pay | Admitting: Family Medicine

## 2018-02-11 ENCOUNTER — Ambulatory Visit (INDEPENDENT_AMBULATORY_CARE_PROVIDER_SITE_OTHER): Payer: Medicare Other | Admitting: Family Medicine

## 2018-02-11 ENCOUNTER — Ambulatory Visit (HOSPITAL_BASED_OUTPATIENT_CLINIC_OR_DEPARTMENT_OTHER)
Admission: RE | Admit: 2018-02-11 | Discharge: 2018-02-11 | Disposition: A | Discharge status: Home / Self Care | Payer: Medicare Other | Source: Ambulatory Visit | Attending: Family Medicine | Admitting: Family Medicine

## 2018-02-11 ENCOUNTER — Ambulatory Visit: Payer: Self-pay | Admitting: *Deleted

## 2018-02-11 VITALS — BP 140/76 | HR 66 | Temp 98.2°F | Ht 67.0 in | Wt 186.2 lb

## 2018-02-11 DIAGNOSIS — S8992XA Unspecified injury of left lower leg, initial encounter: Secondary | ICD-10-CM | POA: Diagnosis not present

## 2018-02-11 MED ORDER — MELOXICAM 15 MG PO TABS: 15.0000 mg | ORAL_TABLET | Freq: Every day | ORAL | 0 refills | Status: DC

## 2018-02-11 NOTE — Patient Instructions (Addendum)
Ice/cold pack over area for 10-15 min twice daily.  OK to take Tylenol 1000 mg (2 extra strength tabs) or 975 mg (3 regular strength tabs) every 6 hours as needed.  A radiologist will review your X-ray and if she/he sees anything I did not, we will be in touch.   Neosporin twice daily over the skin abrasion on your leg.   Send me a message on Monday or call if you aren't getting better or are worsening. We will get you set up with the sports doctor upstairs.   Let us know if you need anything.

## 2018-02-11 NOTE — Telephone Encounter (Signed)
Pt called stating that fell on 02/05/2018; he complains of leg swelling and pain that started at that time; the area is bruised; nurse triage initiated; recommendations made per protocol; pt offered and accepted appointment with Dr Nani Ravens, [place], 02/11/2018 at 1445; he verbalized understanding; will route to office for notification.  Reason for Disposition . [1] High-risk adult (e.g., age > 12, osteoporosis, chronic steroid use) AND [2] limping  Answer Assessment - Initial Assessment Questions 1. MECHANISM: "How did the injury happen?" (e.g., twisting injury, direct blow)      Pt fell on 02/05/2018; he was attempting to climb up in the chair and  2. ONSET: "When did the injury happen?" (Minutes or hours ago)    02/05/2018 3. LOCATION: "Where is the injury located?"      Left leg 4. APPEARANCE of INJURY: "What does the injury look like?"  (e.g., deformity of leg)     Tore skin above knee 5. SEVERITY: "Can you put weight on that leg?" "Can you walk?"      Can bear weight but painful; pt can walk 6. SIZE: For cuts, bruises, or swelling, ask: "How large is it?" (e.g., inches or centimeters)      Bruised area the app 2-3 inches; torn skin the size of a quarter 7. PAIN: "Is there pain?" If so, ask: "How bad is the pain?"  (Scale 1-10; or mild, moderate, severe)     Rated 7 out of 10 8. TETANUS: For any breaks in the skin, ask: "When was the last tetanus booster?"     Yes; not sure of date of last booster 9. OTHER SYMPTOMS: "Do you have any other symptoms?"      no 10. PREGNANCY: "Is there any chance you are pregnant?" "When was your last menstrual period?"       n/a  Protocols used: LEG INJURY-A-AH

## 2018-02-11 NOTE — Progress Notes (Signed)
Musculoskeletal Exam  Patient: Robert Pacheco DOB: 1933/05/17  DOS: 02/11/2018  SUBJECTIVE:  Chief Complaint:   Chief Complaint  Patient presents with  . Leg Pain    left leg chair fell on his leg    Robert Pacheco is a 85 y.o.  male for evaluation and treatment of LLE pain.   Onset:  6 days ago. Feel off chair and it fell into leg Location: LLE Character:  aching  Progression of issue:  has worsened Associated symptoms: swelling Treatment: to date has been ice and acetaminophen.   Neurovascular symptoms: no UTD w tetanus  ROS: Musculoskeletal/Extremities: +LLE pain  Past Medical History:  Diagnosis Date  . Allergy   . Coronary artery disease   . Hyperlipidemia   . Hypertension   . Personal history of colonic adenomas 05/10/2012  . Skin cancer, basal cell     Objective: VITAL SIGNS: BP 140/76 (BP Location: Left Arm, Patient Position: Sitting, Cuff Size: Normal)   Pulse 66   Temp 98.2 F (36.8 C) (Oral)   Ht 5\' 7"  (1.702 m)   Wt 186 lb 4 oz (84.5 kg)   SpO2 98%   BMI 29.17 kg/m  Constitutional: Well formed, well developed. No acute distress. Skin: See below Thorax & Lungs: No accessory muscle use Musculoskeletal: LLE.   Tenderness to palpation: yes Deformity: no Ecchymosis: no Neurologic: Normal sensory function. No focal deficits noted.  Psychiatric: Normal mood. Age appropriate judgment and insight. Alert & oriented x 3.     LLE  Assessment:  Injury of left lower extremity, initial encounter - Plan: DG Tibia/Fibula Left  Plan: Orders as above. XR unofficially neg. Mobic, short course Tylenol, ice, TAO for abrasion. F/u prn, call Mon if no improvement and will refer to Dr. Barbaraann Barthel of sports med. The patient voiced understanding and agreement to the plan.   [place], DO 02/11/18  3:51 PM

## 2018-02-11 NOTE — Progress Notes (Signed)
Pre visit review using our clinic review tool, if applicable. No additional management support is needed unless otherwise documented below in the visit note. 

## 2018-02-19 IMAGING — CR DG ELBOW COMPLETE 3+V*R*
4 series · 4 of 4 positions shown · non-contrast
Comparison: None.

CLINICAL DATA: Laceration to proximal right forearm. Bruising to
elbow.

EXAM:
RIGHT ELBOW - COMPLETE 3+ VIEW

[elbow ap]
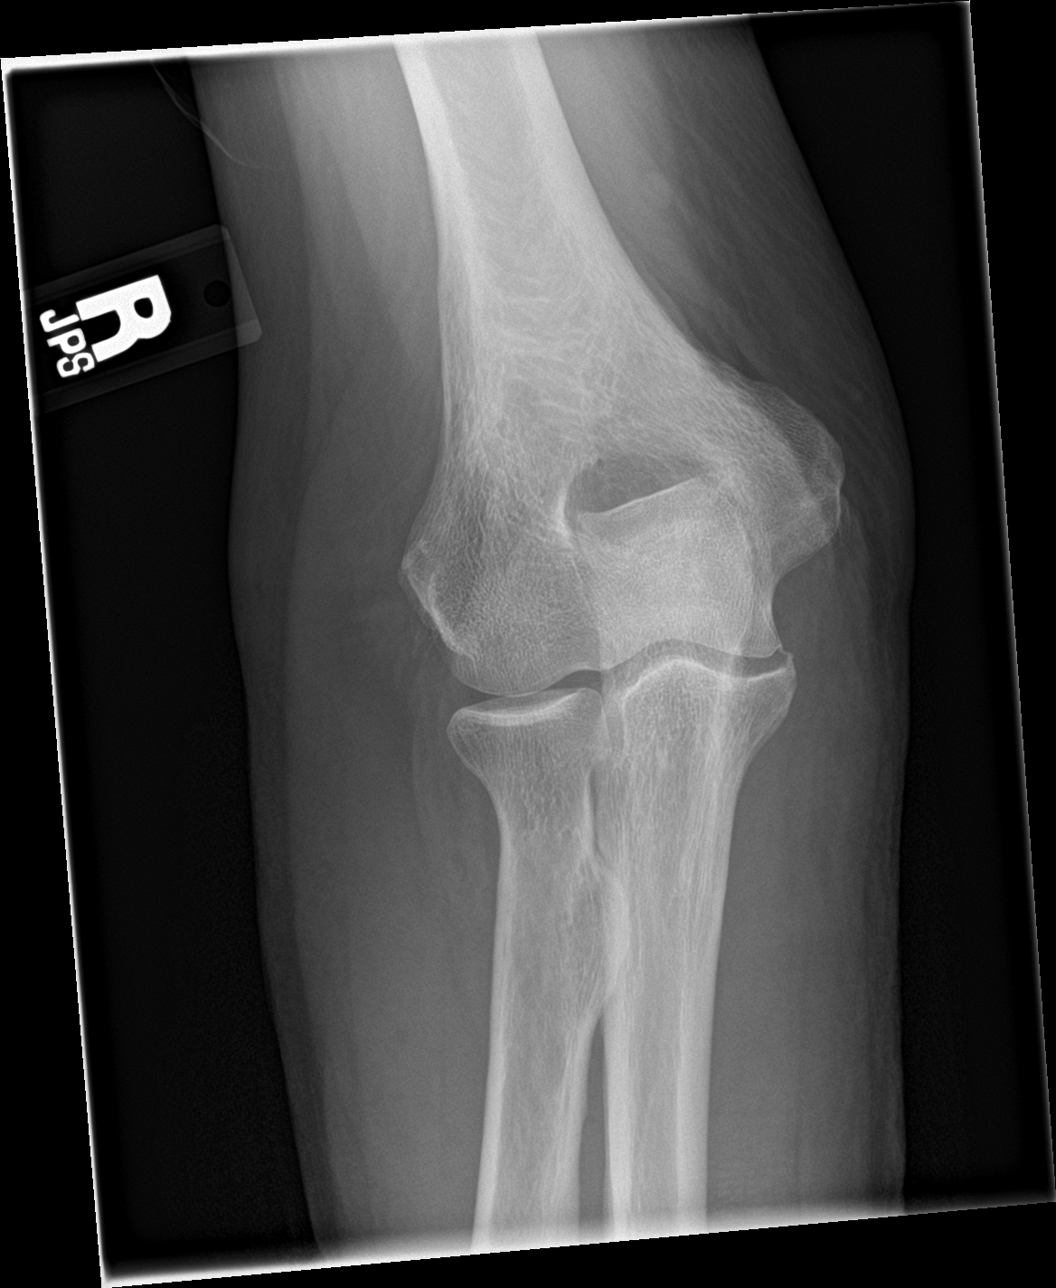

[elbow obl (1 of 2)]
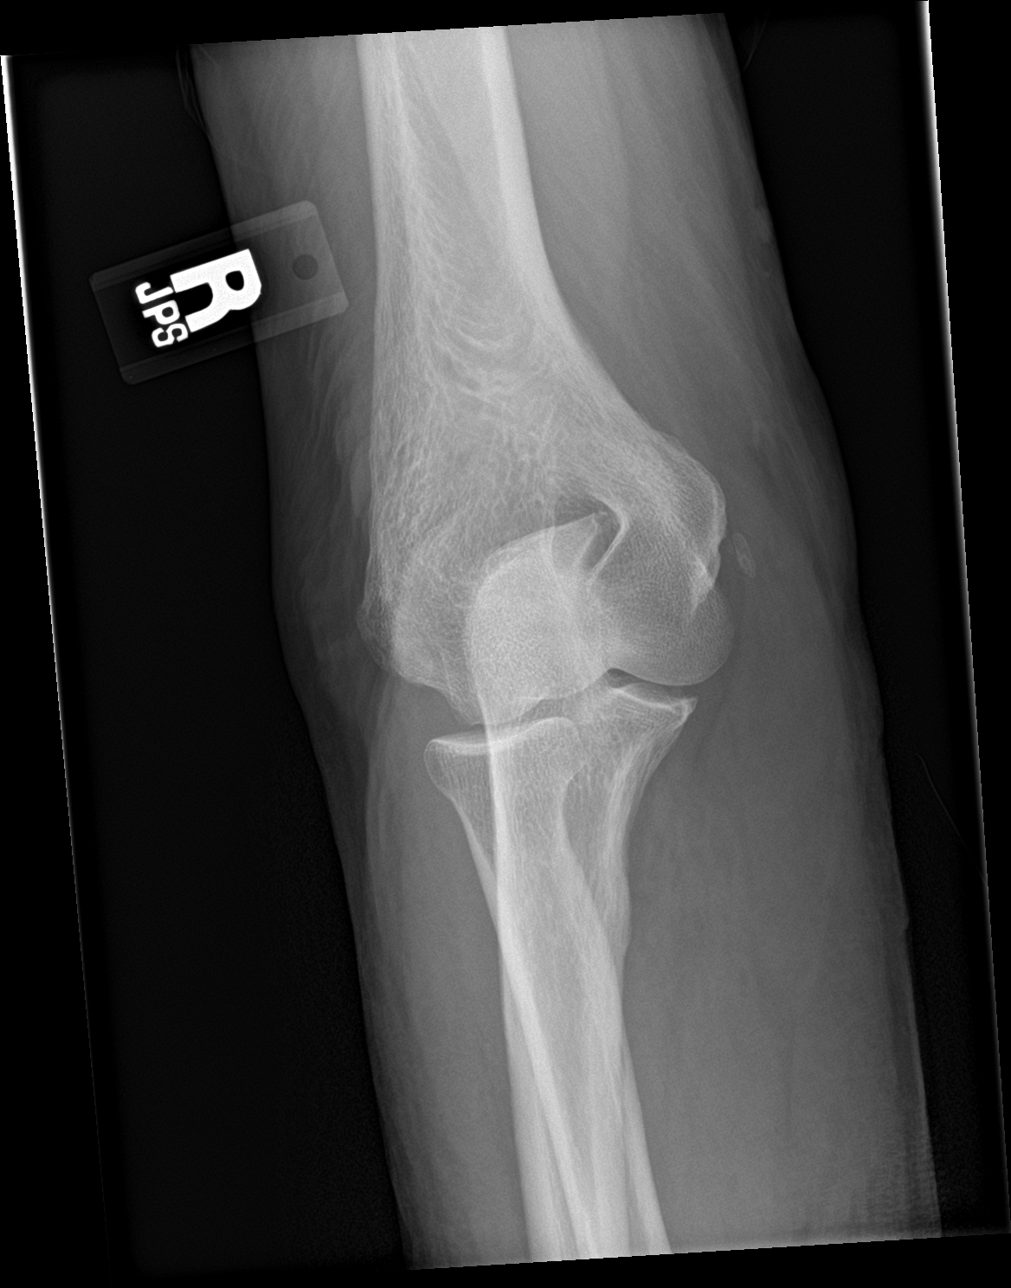

[elbow obl (2 of 2)]
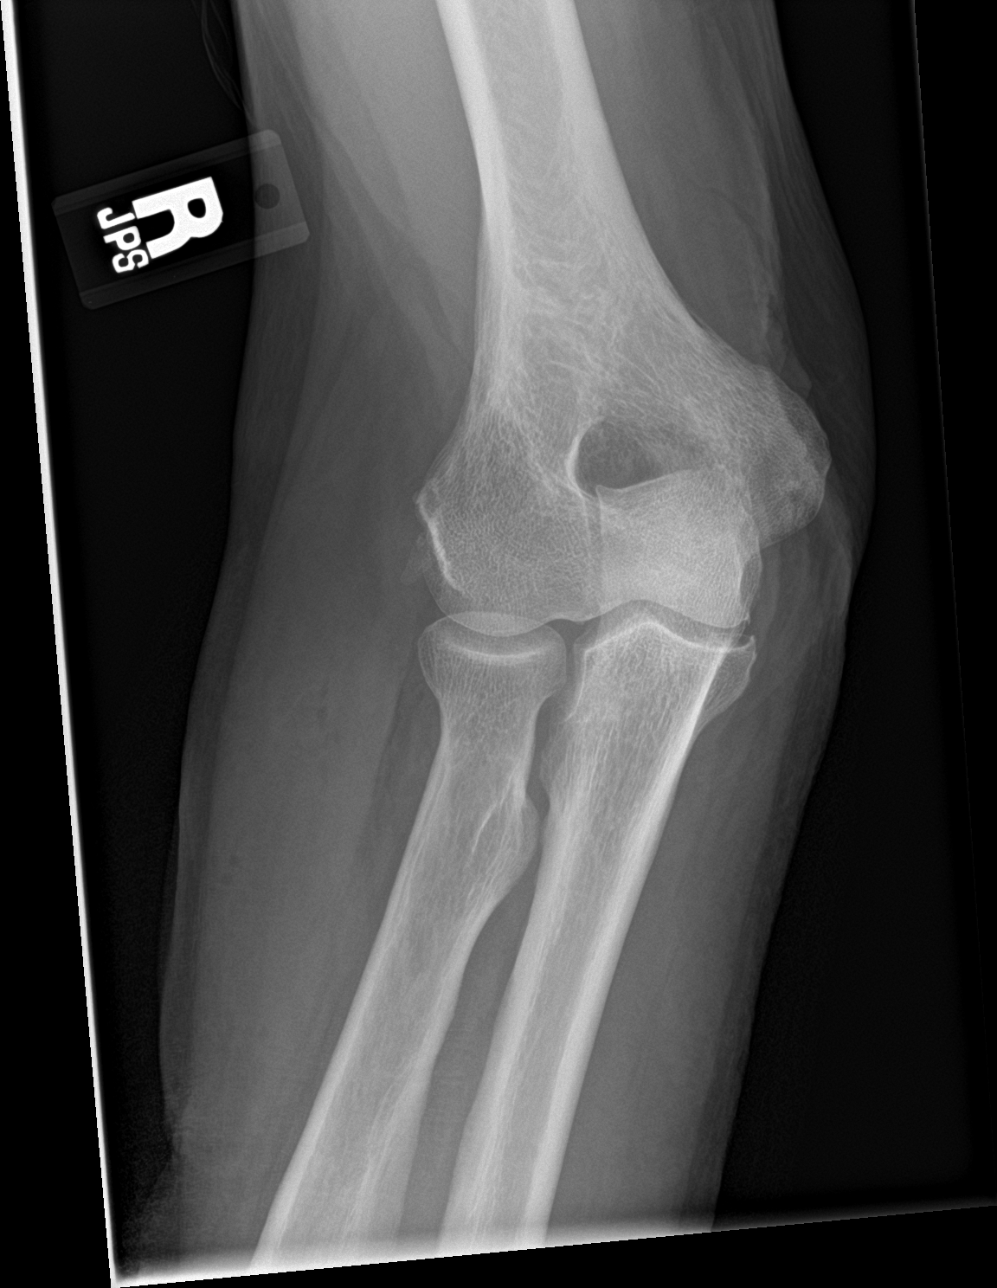

[elbow lat]
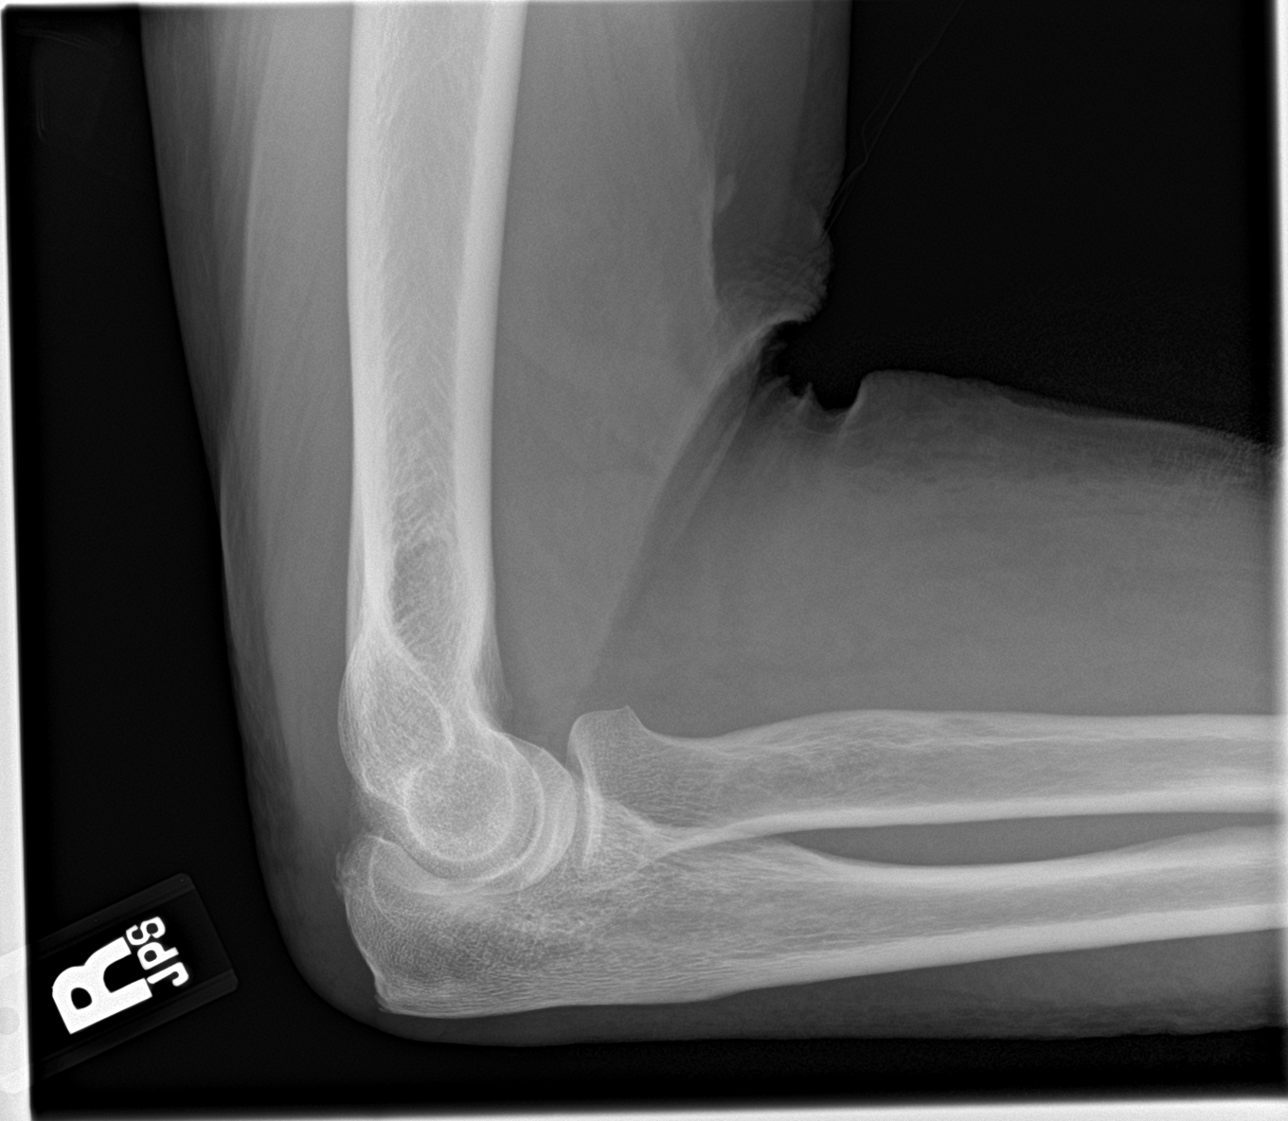

[4 of 4 positions shown; findings below may reference images not displayed]

FINDINGS: No acute bony abnormality. Specifically, no fracture, subluxation,
or dislocation. Soft tissues are intact. No joint effusion.
IMPRESSION: No acute bony abnormality.

## 2018-02-20 ENCOUNTER — Encounter: Payer: Self-pay | Admitting: Emergency Medicine

## 2018-02-20 ENCOUNTER — Emergency Department (INDEPENDENT_AMBULATORY_CARE_PROVIDER_SITE_OTHER)
Admission: EM | Admit: 2018-02-20 | Discharge: 2018-02-20 | Disposition: A | Discharge status: Home / Self Care | Payer: Medicare Other | Source: Home / Self Care

## 2018-02-20 DIAGNOSIS — B354 Tinea corporis: Secondary | ICD-10-CM | POA: Diagnosis not present

## 2018-02-20 MED ORDER — TERBINAFINE HCL 250 MG PO TABS: 250.0000 mg | ORAL_TABLET | Freq: Every day | ORAL | 0 refills | Status: AC

## 2018-02-20 NOTE — ED Triage Notes (Signed)
Patient c/o rash on both arms and side x 1 week, some itching, no change in detergent or soaps.

## 2018-02-20 NOTE — ED Provider Notes (Signed)
[facility]    CSN: [id number] Arrival date & time: 02/20/18  1153     History   Chief Complaint Chief Complaint  Patient presents with  . Rash    HPI Robert Pacheco is a 85 y.o. male.   HPI Robert Pacheco is a 85 y.o. male presenting to UC with c/o itchy dried patchy red rash on upper arms and Right flank that started 1 week ago.  He has taken benadryl with mild relief.  No new soaps, lotions or medications. Rash does not hurt.    Past Medical History:  Diagnosis Date  . Allergy   . Coronary artery disease   . Hyperlipidemia   . Hypertension   . Personal history of colonic adenomas 05/10/2012  . Skin cancer, basal cell     Patient Active Problem List   Diagnosis Date Noted  . Combined forms of age-related cataract of left eye 03/09/2014  . Regular astigmatism 03/09/2014  . Family history of colon cancer 10/01/2012  . History of colonic polyps 05/10/2012  . BPH (benign prostatic hyperplasia) 03/27/2009  . DYSPLASTIC NEVUS, [place] 10/05/2008  . CORONARY ATHEROSCLEROSIS OF ARTERY BYPASS GRAFT 03/02/2007  . Hyperlipidemia 02/22/2007  . Essential hypertension 02/22/2007    Past Surgical History:  Procedure Laterality Date  . CATARACT EXTRACTION    . COLONOSCOPY  multiple  . CORONARY ARTERY BYPASS GRAFT  2000  . EYE SURGERY    . HERNIA REPAIR    . MOHS SURGERY         Home Medications    Prior to Admission medications   Medication Sig Start Date End Date Taking? Authorizing Provider  aspirin 81 MG chewable tablet Chew 81 mg by mouth.    [provider]  finasteride (PROSCAR) 5 MG tablet TAKE 1 TABLET BY MOUTH ONCE DAILY 12/28/17   Nani Ravens, [place], DO  meloxicam (MOBIC) 15 MG tablet Take 1 tablet (15 mg total) by mouth daily. 02/11/18   Shelda Pal, DO  metoprolol succinate (TOPROL-XL) 25 MG 24 hr tablet Take 1 tablet (25 mg total) by mouth daily. 10/12/17   Shelda Pal, DO  mupirocin ointment (BACTROBAN) 2 %  Apply to wound 3 times daily for 5 days 10/03/17   Noe Gens, PA-C  NITROSTAT 0.4 MG SL tablet DISSOLVE ONE TABLET UNDER THE TONGUE EVERY 5 MINUTES AS NEEDED. 08/03/17   Shelda Pal, DO  simvastatin (ZOCOR) 20 MG tablet Take 1 tablet (20 mg total) by mouth at bedtime. 10/12/17   Shelda Pal, DO  terbinafine (LAMISIL) 250 MG tablet Take 1 tablet (250 mg total) by mouth daily for 14 days. 02/20/18 03/06/18  Noe Gens, PA-C    Family History Family History  Problem Relation Age of Onset  . Heart disease Father   . Colon cancer Brother 51  . Healthy Child        x 4  . Healthy Grandchild        x 11  . Colon cancer Sister 66  . Prostate cancer Brother     Social History Social History   Tobacco Use  . Smoking status: Former Research scientist (life sciences)  . Smokeless tobacco: Never Used  Substance Use Topics  . Alcohol use: No  . Drug use: No     Allergies   Other   Review of Systems Review of Systems  Musculoskeletal: Negative for arthralgias and myalgias.  Skin: Positive for rash. Negative for wound.     Physical  Exam Triage Vital Signs ED Triage Vitals  Enc Vitals Group     BP 02/20/18 1206 (!) 168/87     Pulse Rate 02/20/18 1206 72     Resp --      Temp 02/20/18 1206 97.8 F (36.6 C)     Temp Source 02/20/18 1206 Oral     SpO2 02/20/18 1206 99 %     Weight 02/20/18 1207 182 lb 12 oz (82.9 kg)     Height 02/20/18 1207 5\' 7"  (1.702 m)     Head Circumference --      Peak Flow --      Pain Score 02/20/18 1207 0     Pain Loc --      Pain Edu? --      Excl. in [place]? --    No data found.  Updated Vital Signs BP (!) 168/87 (BP Location: Right Arm)   Pulse 72   Temp 97.8 F (36.6 C) (Oral)   Ht 5\' 7"  (1.702 m)   Wt 182 lb 12 oz (82.9 kg)   SpO2 99%   BMI 28.62 kg/m   Visual Acuity Right Eye Distance:   Left Eye Distance:   Bilateral Distance:    Right Eye Near:   Left Eye Near:    Bilateral Near:     Physical Exam Vitals signs and nursing  note reviewed.  Constitutional:      Appearance: Normal appearance. He is well-developed.  HENT:     Head: Normocephalic and atraumatic.  Neck:     Musculoskeletal: Normal range of motion.  Cardiovascular:     Rate and Rhythm: Normal rate.  Pulmonary:     Effort: Pulmonary effort is normal.  Musculoskeletal: Normal range of motion.  Skin:    General: Skin is warm and dry.     Findings: Rash present.     Comments: Bilateral upper arms and right flank: faint erythematous dried patchy rash with well defined circular lesions with central clearing. Non-tender. No drainage.   Neurological:     Mental Status: He is alert and oriented to person, place, and time.  Psychiatric:        Behavior: Behavior normal.      UC Treatments / Results  Labs (all labs ordered are listed, but only abnormal results are displayed) Labs Reviewed - No data to display  EKG None  Radiology No results found.  Procedures Procedures (including critical care time)  Medications Ordered in UC Medications - No data to display  Initial Impression / Assessment and Plan / UC Course  I have reviewed the triage vital signs and the nursing notes.  Pertinent labs & imaging results that were available during my care of the patient were reviewed by me and considered in my medical decision making (see chart for details).     Rash most c/w tinea Will tx with oral terbinafine given diffuse nature of rash.  Final Clinical Impressions(s) / UC Diagnoses   Final diagnoses:  Tinea corporis     Discharge Instructions      Please take the medication as prescribed.  Call to schedule a follow up appointment with your family doctor in 1-2 weeks if not improving, sooner if worsening- continuing to spread, develop pain, or other new symptoms.    ED Prescriptions    Medication Sig Dispense Auth. Provider   terbinafine (LAMISIL) 250 MG tablet Take 1 tablet (250 mg total) by mouth daily for 14 days. 14 tablet  Noe Gens, [place]  Controlled Substance Prescriptions [place] Controlled Substance Registry consulted? Not Applicable   Tyrell Antonio 02/20/18 1335

## 2018-02-20 NOTE — Discharge Instructions (Signed)
°  Please take the medication as prescribed.  Call to schedule a follow up appointment with your family doctor in 1-2 weeks if not improving, sooner if worsening- continuing to spread, develop pain, or other new symptoms.

## 2018-03-10 ENCOUNTER — Encounter: Payer: Self-pay | Admitting: Medical

## 2018-03-10 ENCOUNTER — Ambulatory Visit (INDEPENDENT_AMBULATORY_CARE_PROVIDER_SITE_OTHER): Payer: Medicare Other | Admitting: Medical

## 2018-03-10 VITALS — BP 136/71 | HR 59 | Temp 97.9°F | Resp 16 | Ht 67.0 in | Wt 185.0 lb

## 2018-03-10 DIAGNOSIS — R21 Rash and other nonspecific skin eruption: Secondary | ICD-10-CM

## 2018-03-10 DIAGNOSIS — L309 Dermatitis, unspecified: Secondary | ICD-10-CM | POA: Diagnosis not present

## 2018-03-10 DIAGNOSIS — L299 Pruritus, unspecified: Secondary | ICD-10-CM

## 2018-03-10 MED ORDER — CLOTRIMAZOLE-BETAMETHASONE 1-0.05 % EX CREA: 1.0000 "application " | TOPICAL_CREAM | Freq: Two times a day (BID) | CUTANEOUS | 0 refills | Status: DC

## 2018-03-10 MED ORDER — METHYLPREDNISOLONE ACETATE 40 MG/ML IJ SUSP
40.0000 mg | Freq: Once | INTRAMUSCULAR | Status: AC
  Administered 2018-03-10: 40 mg via INTRAMUSCULAR

## 2018-03-10 MED ORDER — PREDNISONE 10 MG PO TABS: ORAL_TABLET | ORAL | 0 refills | Status: DC

## 2018-03-10 NOTE — Progress Notes (Signed)
Subjective:    Patient ID: Robert Pacheco, male    DOB: 27-Nov-1933, 85 y.o.   MRN: [id number]  HPI   Pt is in diffuse rash on pt upper arms  And on thorax.  Pt was seen in the ED and he was diagnosed with tinea corposis. Given lamsil tablets for 2 weeks. Not much improvement per pt. In fact rash on his upper arms spread more per pt.   The area do itch.  Pt denies any history of eczema.  Also some red dry area behind knees as well.   Review of Systems  Constitutional: Negative for chills, fatigue and fever.  Respiratory: Negative for cough, chest tightness, shortness of breath and wheezing.   Cardiovascular: Negative for chest pain and palpitations.  Gastrointestinal: Negative for abdominal pain.  Skin: Positive for rash.       See hpi.  Neurological: Negative for dizziness, seizures and headaches.  Hematological: Negative for adenopathy. Does not bruise/bleed easily.  Psychiatric/Behavioral: Negative for behavioral problems and decreased concentration.   Past Medical History:  Diagnosis Date  . Allergy   . Coronary artery disease   . Hyperlipidemia   . Hypertension   . Personal history of colonic adenomas 05/10/2012  . Skin cancer, basal cell      Social History   Socioeconomic History  . Marital status: Married    Spouse name: Not on file  . Number of children: 4  . Years of education: Not on file  . Highest education level: Not on file  Occupational History    Comment: Education administrator  Social Needs  . Financial resource strain: Not on file  . Food insecurity:    Worry: Not on file    Inability: Not on file  . Transportation needs:    Medical: Not on file    Non-medical: Not on file  Tobacco Use  . Smoking status: Former Research scientist (life sciences)  . Smokeless tobacco: Never Used  Substance and Sexual Activity  . Alcohol use: No  . Drug use: No  . Sexual activity: Not on file  Lifestyle  . Physical activity:    Days per week: Not on file    Minutes per session: Not  on file  . Stress: Not on file  Relationships  . Social connections:    Talks on phone: Not on file    Gets together: Not on file    Attends religious service: Not on file    Active member of club or organization: Not on file    Attends meetings of clubs or organizations: Not on file    Relationship status: Not on file  . Intimate partner violence:    Fear of current or ex partner: Not on file    Emotionally abused: Not on file    Physically abused: Not on file    Forced sexual activity: Not on file  Other Topics Concern  . Not on file  Social History Narrative  . Not on file    Past Surgical History:  Procedure Laterality Date  . CATARACT EXTRACTION    . COLONOSCOPY  multiple  . CORONARY ARTERY BYPASS GRAFT  2000  . EYE SURGERY    . HERNIA REPAIR    . MOHS SURGERY      Family History  Problem Relation Age of Onset  . Heart disease Father   . Colon cancer Brother 44  . Healthy Child        x 4  . Healthy Grandchild  x 11  . Colon cancer Sister 13  . Prostate cancer Brother     Allergies  Allergen Reactions  . Other Nausea Only    UNKNOWN PAIN MED    Current Outpatient Medications on File Prior to Visit  Medication Sig Dispense Refill  . aspirin 81 MG chewable tablet Chew 81 mg by mouth.    . finasteride (PROSCAR) 5 MG tablet TAKE 1 TABLET BY MOUTH ONCE DAILY 90 tablet 0  . meloxicam (MOBIC) 15 MG tablet Take 1 tablet (15 mg total) by mouth daily. 15 tablet 0  . metoprolol succinate (TOPROL-XL) 25 MG 24 hr tablet Take 1 tablet (25 mg total) by mouth daily. 90 tablet 3  . mupirocin ointment (BACTROBAN) 2 % Apply to wound 3 times daily for 5 days 22 g 0  . NITROSTAT 0.4 MG SL tablet DISSOLVE ONE TABLET UNDER THE TONGUE EVERY 5 MINUTES AS NEEDED. 25 tablet 1  . simvastatin (ZOCOR) 20 MG tablet Take 1 tablet (20 mg total) by mouth at bedtime. 90 tablet 3   No current facility-administered medications on file prior to visit.     BP 136/71   Pulse (!) 59    Temp 97.9 F (36.6 C) (Oral)   Resp 16   Ht 5\' 7"  (1.702 m)   Wt 185 lb (83.9 kg)   SpO2 100%   BMI 28.98 kg/m       Objective:   Physical Exam  General- No acute distress. Pleasant patient. Neck- Full range of motion, no jvd Lungs- Clear, even and unlabored. Heart- regular rate and rhythm. Neurologic- CNII- XII grossly intact.  Skin- red/pinkish rash from upper arms down to forearms. Not warm to touch, not tender. Dry to touch. Abdomen- 4-5 circular pink rash diameter of about 2.5 cm. No central clearing. No definite rim to edge of rash. Behind knees-popliteal area and upper calf dry flaky rash.      Assessment & Plan:  You were diagnosed with ringworm in the emergency department.  You have been on Lamisil for 2 weeks by you describe minimal to no improvement in some areas.  Also describe unfortunately rash has worsened.   This makes me think you might not have ringworm/tinea corporis.  On today's exam the dryness of the rash, widespread distribution including popliteal regions make me think you have possible eczema.  We gave you Depo-Medrol 40 mg IM injection today.  You can start prednisone 4-day taper dose tomorrow.  I did give you prescription of Lotrisone to apply thin film twice daily to circular areas on your abdomen only.  Also recommend that you get good moisturizer over-the-counter such as Aveeno or Lubriderm or Cetaphil.  Apply these twice daily to rash regions.  Follow-up next Wednesday morning 8:20 AM or sooner.  Mackie Pai, PA-C

## 2018-03-10 NOTE — Patient Instructions (Signed)
You were diagnosed with ringworm in the emergency department.  You have been on Lamisil for 2 weeks by you describe minimal to no improvement in some areas.  Also describe unfortunately rash has worsened.   This makes me think you might not have ringworm/tinea corporis.  On today's exam the dryness of the rash, widespread distribution including popliteal regions make me think you have possible eczema.  We gave you Depo-Medrol 40 mg IM injection today.  You can start prednisone 4-day taper dose tomorrow.  I did give you prescription of Lotrisone to apply thin film twice daily to circular areas on your abdomen only.  Also recommend that you get good moisturizer over-the-counter such as Aveeno or Lubriderm or Cetaphil.  Apply these twice daily to rash regions.  Follow-up next Wednesday morning 8:20 AM or sooner.

## 2018-03-17 ENCOUNTER — Encounter: Payer: Self-pay | Admitting: Family Medicine

## 2018-03-17 ENCOUNTER — Ambulatory Visit (INDEPENDENT_AMBULATORY_CARE_PROVIDER_SITE_OTHER): Payer: Medicare Other | Admitting: Family Medicine

## 2018-03-17 VITALS — BP 108/70 | HR 64 | Temp 98.3°F | Ht 67.0 in | Wt 182.0 lb

## 2018-03-17 DIAGNOSIS — R21 Rash and other nonspecific skin eruption: Secondary | ICD-10-CM | POA: Diagnosis not present

## 2018-03-17 MED ORDER — TRIAMCINOLONE ACETONIDE 0.5 % EX CREA: 1.0000 "application " | TOPICAL_CREAM | Freq: Two times a day (BID) | CUTANEOUS | 0 refills | Status: AC

## 2018-03-17 NOTE — Patient Instructions (Signed)
This looks like nummular eczema. We are starting a steroid cream. Do not put any other creams on it. Continue with lotions over area though.  If no improvement, please come back and we will take a biopsy.  Let us know if you need anything.

## 2018-03-17 NOTE — Progress Notes (Signed)
Chief Complaint  Patient presents with  . Rash    Robert Pacheco is a 85 y.o. male here for a skin complaint.  Duration: 3 weeks Location: LE's, torso Pruritic? Yes Painful? No Drainage? No New soaps/lotions/topicals/detergents? No Sick contacts? No Other associated symptoms: None Therapies tried thus far: Lamisil, IM/PO steroids, Lotrisone  ROS:  Const: No fevers Skin: As noted in HPI  Past Medical History:  Diagnosis Date  . Allergy   . Coronary artery disease   . Hyperlipidemia   . Hypertension   . Personal history of colonic adenomas 05/10/2012  . Skin cancer, basal cell     BP 108/70 (BP Location: Left Arm, Patient Position: Sitting, Cuff Size: Normal)   Pulse 64   Temp 98.3 F (36.8 C) (Oral)   Ht 5\' 7"  (1.702 m)   Wt 182 lb (82.6 kg)   SpO2 96%   BMI 28.51 kg/m  Gen: awake, alert, appearing stated age Lungs: No accessory muscle use Skin: See below. No drainage, TTP, fluctuance, excoriation Psych: Age appropriate judgment and insight   L posterior knee   L abd wall   R abd wall   Rash - Plan: triamcinolone cream (KENALOG) 0.5 %  Orders as above. Bid for 10 d. Looks like nummular eczema.  F/u prn, may need to biopsy if no better. The patient voiced understanding and agreement to the plan.  [place], DO 03/17/18 11:12 AM

## 2018-04-08 ENCOUNTER — Other Ambulatory Visit: Payer: Self-pay | Admitting: Family Medicine

## 2018-04-08 DIAGNOSIS — N400 Enlarged prostate without lower urinary tract symptoms: Secondary | ICD-10-CM

## 2018-04-12 ENCOUNTER — Ambulatory Visit: Payer: Medicare Other | Admitting: Family Medicine

## 2018-09-01 ENCOUNTER — Ambulatory Visit: Payer: Medicare Other | Admitting: Family Medicine

## 2018-10-13 ENCOUNTER — Other Ambulatory Visit: Payer: Self-pay

## 2018-10-14 NOTE — Progress Notes (Signed)
Subjective:   Robert Pacheco is a 85 y.o. male who presents for Medicare Annual/Subsequent preventive examination.  Review of Systems:  Home Safety/Smoke Alarms: Feels safe in home. Smoke alarms in place.  Lives with wife in 1 story home.   Male:   PSA-  Lab Results  Component Value Date   PSA 2.81 09/11/2014   PSA 4.63 (H) 07/28/2013   PSA 4.30 (H) 07/06/2012        Objective:    Vitals: BP 140/84 (BP Location: Left Arm, Patient Position: Sitting, Cuff Size: Normal) Comment: all vitals done by R.Ewing CMA  Pulse 80   Temp (!) 96.6 F (35.9 C) (Temporal)   Ht 5\' 7"  (1.702 m)   Wt 182 lb (82.6 kg)   SpO2 95%   BMI 28.51 kg/m   Body mass index is 28.51 kg/m.  Advanced Directives 10/15/2018 10/12/2017 10/08/2016 08/09/2016 08/07/2016 10/15/2015  Does Patient Have a Medical Advance Directive? Yes No Yes No Yes Yes  Type of Paramedic of [place];Living will - [place];Living will - Living will [place];Living will  Does patient want to make changes to medical advance directive? No - Patient declined - - - - -  Copy of [place] in Chart? No - copy requested - - - - -  Would patient like information on creating a medical advance directive? - No - Patient declined - - - -    Tobacco Social History   Tobacco Use  Smoking Status Former Smoker  Smokeless Tobacco Never Used     Counseling given: Not Answered   Clinical Intake:     Pain : No/denies pain                 Past Medical History:  Diagnosis Date  . Allergy   . Coronary artery disease   . Hyperlipidemia   . Hypertension   . Personal history of colonic adenomas 05/10/2012  . Skin cancer, basal cell    Past Surgical History:  Procedure Laterality Date  . CATARACT EXTRACTION    . COLONOSCOPY  multiple  . CORONARY ARTERY BYPASS GRAFT  2000  . EYE SURGERY    . HERNIA REPAIR    . MOHS SURGERY     Family History   Problem Relation Age of Onset  . Heart disease Father   . Colon cancer Brother 31  . Healthy Child        x 4  . Healthy Grandchild        x 11  . Colon cancer Sister 36  . Prostate cancer Brother    Social History   Socioeconomic History  . Marital status: Married    Spouse name: Not on file  . Number of children: 4  . Years of education: Not on file  . Highest education level: Not on file  Occupational History    Comment: Education administrator  Social Needs  . Financial resource strain: Not on file  . Food insecurity    Worry: Not on file    Inability: Not on file  . Transportation needs    Medical: Not on file    Non-medical: Not on file  Tobacco Use  . Smoking status: Former Research scientist (life sciences)  . Smokeless tobacco: Never Used  Substance and Sexual Activity  . Alcohol use: No  . Drug use: No  . Sexual activity: Not on file  Lifestyle  . Physical activity  Days per week: Not on file    Minutes per session: Not on file  . Stress: Not on file  Relationships  . Social Herbalist on phone: Not on file    Gets together: Not on file    Attends religious service: Not on file    Active member of club or organization: Not on file    Attends meetings of clubs or organizations: Not on file    Relationship status: Not on file  Other Topics Concern  . Not on file  Social History Narrative  . Not on file    Outpatient Encounter Medications as of 10/15/2018  Medication Sig  . aspirin 81 MG chewable tablet Chew 81 mg by mouth.  . meloxicam (MOBIC) 15 MG tablet Take 1 tablet (15 mg total) by mouth daily.  . simvastatin (ZOCOR) 20 MG tablet Take 1 tablet (20 mg total) by mouth at bedtime.  [name] NITROSTAT 0.4 MG SL tablet DISSOLVE ONE TABLET UNDER THE TONGUE EVERY 5 MINUTES AS NEEDED. (Patient not taking: Reported on 10/15/2018)  . [DISCONTINUED] finasteride (PROSCAR) 5 MG tablet Take 1 tablet by mouth once daily (Patient not taking: Reported on 10/15/2018)  . [DISCONTINUED]  metoprolol succinate (TOPROL-XL) 25 MG 24 hr tablet Take 1 tablet (25 mg total) by mouth daily. (Patient not taking: Reported on 10/15/2018)  . [DISCONTINUED] NITROSTAT 0.4 MG SL tablet DISSOLVE ONE TABLET UNDER THE TONGUE EVERY 5 MINUTES AS NEEDED.  . [DISCONTINUED] predniSONE (DELTASONE) 10 MG tablet 4 tab po day 1, 3 tab po day 2, 2 tab po day 3, 1 tab po day 4.  . [DISCONTINUED] simvastatin (ZOCOR) 20 MG tablet Take 1 tablet (20 mg total) by mouth at bedtime.   No facility-administered encounter medications on file as of 10/15/2018.     Activities of Daily Living In your present state of health, do you have any difficulty performing the following activities: 10/15/2018  Hearing? N  Vision? N  Comment wears reading glasses.  Difficulty concentrating or making decisions? N  Walking or climbing stairs? N  Dressing or bathing? N  Doing errands, shopping? N  Preparing Food and eating ? N  Using the Toilet? N  In the past six months, have you accidently leaked urine? N  Do you have problems with loss of bowel control? N  Managing your Medications? N  Managing your Finances? N  Housekeeping or managing your Housekeeping? N  Some recent data might be hidden    Patient Care Team: Shelda Pal, DO as PCP - General (Family Medicine)   Assessment:   This is a routine wellness examination for [name]. Physical assessment deferred to PCP.  Exercise Activities and Dietary recommendations Current Exercise Habits: The patient does not participate in regular exercise at present, Exercise limited by: None identified Diet (meal preparation, eat out, water intake, caffeinated beverages, dairy products, fruits and vegetables): 24 hr recall Breakfast: cereal Lunch: cheese sandwich Dinner: hamburger helper     Goals    . Maintain current health (pt-stated)       Fall Risk Fall Risk  10/15/2018 10/12/2017 10/08/2016 09/28/2015 09/11/2014  Falls in the past year? 1 No No Yes No  Number falls  in past yr: 0 - - 1 -  Comment - - - Fell off stool in garage last week and bumped head. -  Injury with Fall? 0 - - No -    Depression Screen PHQ 2/9 Scores 10/15/2018 10/12/2017 10/08/2016 09/28/2015  PHQ -  2 Score 0 0 0 0    Cognitive Function Ad8 score reviewed for issues:  Issues making decisions: no  Less interest in hobbies / activities:no  Repeats questions, stories (family complaining):no  Trouble using ordinary gadgets (microwave, computer, phone):no  Forgets the month or year: no  Mismanaging finances: no  Remembering appts:no  Daily problems with thinking and/or memory:no Ad8 score is=0   MMSE - Mini Mental State Exam 10/12/2017 10/08/2016  Orientation to time 5 5  Orientation to Place 5 5  Registration 3 3  Attention/ Calculation 4 4  Recall 1 2  Language- name 2 objects 2 2  Language- repeat 1 1  Language- follow 3 step command 3 3  Language- read & follow direction 1 1  Write a sentence 1 1  Copy design 1 0  Total score 27 27        Immunization History  Administered Date(s) Administered  . Fluad Quad(high Dose 65+) 10/15/2018  . Influenza Split 12/31/2010, 01/14/2012  . Influenza Whole 12/03/2006, 11/10/2007, 10/31/2008, 12/10/2009  . Influenza, High Dose Seasonal PF 12/15/2012, 12/13/2013  . Influenza-Unspecified 02/26/2017  . Pneumococcal Conjugate-13 07/28/2013  . Pneumococcal Polysaccharide-23 01/27/2001, 03/02/2007  . Td 01/28/2004  . Tdap 09/11/2014   Screening Tests Health Maintenance  Topic Date Due  . TETANUS/TDAP  09/10/2024  . INFLUENZA VACCINE  Completed  . PNA vac Low Risk Adult  Completed    Plan:   See you next year!  Continue to eat heart healthy diet (full of fruits, vegetables, whole grains, lean protein, water--limit salt, fat, and sugar intake) and increase physical activity as tolerated.  Continue doing brain stimulating activities (puzzles, reading, adult coloring books, staying active) to keep memory sharp.    Bring a copy of your living will and/or healthcare power of attorney to your next office visit.   I have personally reviewed and noted the following in the patient's chart:   . Medical and social history . Use of alcohol, tobacco or illicit drugs  . Current medications and supplements . Functional ability and status . Nutritional status . Physical activity . Advanced directives . List of other physicians . Hospitalizations, surgeries, and ER visits in previous 12 months . Vitals . Screenings to include cognitive, depression, and falls . Referrals and appointments  In addition, I have reviewed and discussed with patient certain preventive protocols, quality metrics, and best practice recommendations. A written personalized care plan for preventive services as well as general preventive health recommendations were provided to patient.     Naaman Plummer [place], [place]  10/15/2018

## 2018-10-15 ENCOUNTER — Ambulatory Visit (INDEPENDENT_AMBULATORY_CARE_PROVIDER_SITE_OTHER): Payer: Medicare Other | Admitting: *Deleted

## 2018-10-15 ENCOUNTER — Ambulatory Visit (INDEPENDENT_AMBULATORY_CARE_PROVIDER_SITE_OTHER): Payer: Medicare Other | Admitting: Family Medicine

## 2018-10-15 ENCOUNTER — Encounter: Payer: Self-pay | Admitting: Family Medicine

## 2018-10-15 ENCOUNTER — Encounter: Payer: Self-pay | Admitting: *Deleted

## 2018-10-15 ENCOUNTER — Other Ambulatory Visit: Payer: Self-pay

## 2018-10-15 VITALS — BP 140/84 | HR 80 | Temp 96.6°F | Ht 67.0 in | Wt 182.0 lb

## 2018-10-15 VITALS — BP 140/84 | HR 80 | Temp 96.6°F | Ht 67.0 in | Wt 182.4 lb

## 2018-10-15 DIAGNOSIS — I25701 Atherosclerosis of coronary artery bypass graft(s), unspecified, with angina pectoris with documented spasm: Secondary | ICD-10-CM | POA: Diagnosis not present

## 2018-10-15 DIAGNOSIS — R600 Localized edema: Secondary | ICD-10-CM | POA: Diagnosis not present

## 2018-10-15 DIAGNOSIS — Z23 Encounter for immunization: Secondary | ICD-10-CM | POA: Diagnosis not present

## 2018-10-15 DIAGNOSIS — Z0000 Encounter for general adult medical examination without abnormal findings: Secondary | ICD-10-CM

## 2018-10-15 DIAGNOSIS — I10 Essential (primary) hypertension: Secondary | ICD-10-CM | POA: Diagnosis not present

## 2018-10-15 LAB — LIPID PANEL
Cholesterol: 97 mg/dL (ref 0–200)
HDL: 34.6 mg/dL — ABNORMAL LOW (ref >39.00)
LDL Cholesterol: 45 mg/dL (ref 0–99)
NonHDL: 62.57
Total CHOL/HDL Ratio: 3
Triglycerides: 90 mg/dL (ref 0.0–149.0)
VLDL: 18 mg/dL (ref 0.0–40.0)

## 2018-10-15 LAB — COMPREHENSIVE METABOLIC PANEL
ALT: 11 U/L (ref 0–53)
AST: 19 U/L (ref 0–37)
Albumin: 4.2 g/dL (ref 3.5–5.2)
Alkaline Phosphatase: 78 U/L (ref 39–117)
BUN: 15 mg/dL (ref 6–23)
CO2: 26 mEq/L (ref 19–32)
Calcium: 9.5 mg/dL (ref 8.4–10.5)
Chloride: 105 mEq/L (ref 96–112)
Creatinine, Ser: 1.06 mg/dL (ref 0.40–1.50)
GFR: 66.65 mL/min (ref >60.00)
Glucose, Bld: 92 mg/dL (ref 70–99)
Potassium: 4.3 mEq/L (ref 3.5–5.1)
Sodium: 139 mEq/L (ref 135–145)
Total Bilirubin: 0.8 mg/dL (ref 0.2–1.2)
Total Protein: 7 g/dL (ref 6.0–8.3)

## 2018-10-15 MED ORDER — NITROSTAT 0.4 MG SL SUBL: SUBLINGUAL_TABLET | SUBLINGUAL | 1 refills | Status: DC

## 2018-10-15 MED ORDER — SIMVASTATIN 20 MG PO TABS: 20.0000 mg | ORAL_TABLET | Freq: Every day | ORAL | 3 refills | Status: DC

## 2018-10-15 NOTE — Patient Instructions (Signed)
Keep the diet clean and stay active.  Give us 2-3 business days to get the results of your labs back.   For the swelling in your lower extremities, be sure to elevate your legs when able, mind the salt intake, stay physically active and consider wearing compression stockings.  Let us know if you need anything.  

## 2018-10-15 NOTE — Patient Instructions (Signed)
See you next year!  Continue to eat heart healthy diet (full of fruits, vegetables, whole grains, lean protein, water--limit salt, fat, and sugar intake) and increase physical activity as tolerated.  Continue doing brain stimulating activities (puzzles, reading, adult coloring books, staying active) to keep memory sharp.   Bring a copy of your living will and/or healthcare power of attorney to your next office visit.   [name] , Thank you for taking time to come for your Medicare Wellness Visit. I appreciate your ongoing commitment to your health goals. Please review the following plan we discussed and let me know if I can assist you in the future.   These are the goals we discussed: Goals    . Maintain current health (pt-stated)       This is a list of the screening recommended for you and due dates:  Health Maintenance  Topic Date Due  . Tetanus Vaccine  09/10/2024  . Flu Shot  Completed  . Pneumonia vaccines  Completed    Health Maintenance After Age 63 After age 50, you are at a higher risk for certain long-term diseases and infections as well as injuries from falls. Falls are a major cause of broken bones and head injuries in people who are older than age 62. Getting regular preventive care can help to keep you healthy and well. Preventive care includes getting regular testing and making lifestyle changes as recommended by your health care provider. Talk with your health care provider about:  Which screenings and tests you should have. A screening is a test that checks for a disease when you have no symptoms.  A diet and exercise plan that is right for you. What should I know about screenings and tests to prevent falls? Screening and testing are the best ways to find a health problem early. Early diagnosis and treatment give you the best chance of managing medical conditions that are common after age 39. Certain conditions and lifestyle choices may make you more likely to have  a fall. Your health care provider may recommend:  Regular vision checks. Poor vision and conditions such as cataracts can make you more likely to have a fall. If you wear glasses, make sure to get your prescription updated if your vision changes.  Medicine review. Work with your health care provider to regularly review all of the medicines you are taking, including over-the-counter medicines. Ask your health care provider about any side effects that may make you more likely to have a fall. Tell your health care provider if any medicines that you take make you feel dizzy or sleepy.  Osteoporosis screening. Osteoporosis is a condition that causes the bones to get weaker. This can make the bones weak and cause them to break more easily.  Blood pressure screening. Blood pressure changes and medicines to control blood pressure can make you feel dizzy.  Strength and balance checks. Your health care provider may recommend certain tests to check your strength and balance while standing, walking, or changing positions.  Foot health exam. Foot pain and numbness, as well as not wearing proper footwear, can make you more likely to have a fall.  Depression screening. You may be more likely to have a fall if you have a fear of falling, feel emotionally low, or feel unable to do activities that you used to do.  Alcohol use screening. Using too much alcohol can affect your balance and may make you more likely to have a fall. What actions can  I take to lower my risk of falls? General instructions  Talk with your health care provider about your risks for falling. Tell your health care provider if: ? You fall. Be sure to tell your health care provider about all falls, even ones that seem minor. ? You feel dizzy, sleepy, or off-balance.  Take over-the-counter and prescription medicines only as told by your health care provider. These include any supplements.  Eat a healthy diet and maintain a healthy weight. A  healthy diet includes low-fat dairy products, low-fat (lean) meats, and fiber from whole grains, beans, and lots of fruits and vegetables. Home safety  Remove any tripping hazards, such as rugs, cords, and clutter.  Install safety equipment such as grab bars in bathrooms and safety rails on stairs.  Keep rooms and walkways well-lit. Activity   Follow a regular exercise program to stay fit. This will help you maintain your balance. Ask your health care provider what types of exercise are appropriate for you.  If you need a cane or walker, use it as recommended by your health care provider.  Wear supportive shoes that have nonskid soles. Lifestyle  Do not drink alcohol if your health care provider tells you not to drink.  If you drink alcohol, limit how much you have: ? 0-1 drink a day for women. ? 0-2 drinks a day for men.  Be aware of how much alcohol is in your drink. In the [place] one drink equals one typical bottle of beer (12 oz), one-half glass of wine (5 oz), or one shot of hard liquor (1 oz).  Do not use any products that contain nicotine or tobacco, such as cigarettes and e-cigarettes. If you need help quitting, ask your health care provider. Summary  Having a healthy lifestyle and getting preventive care can help to protect your health and wellness after age 62.  Screening and testing are the best way to find a health problem early and help you avoid having a fall. Early diagnosis and treatment give you the best chance for managing medical conditions that are more common for people who are older than age 71.  Falls are a major cause of broken bones and head injuries in people who are older than age 63. Take precautions to prevent a fall at home.  Work with your health care provider to learn what changes you can make to improve your health and wellness and to prevent falls. This information is not intended to replace advice given to you by your health care provider. Make  sure you discuss any questions you have with your health care provider. Document Released: 11/26/2016 Document Revised: 05/06/2018 Document Reviewed: 11/26/2016 Elsevier Patient Education  2020 [employer].

## 2018-10-15 NOTE — Progress Notes (Signed)
Chief Complaint  Patient presents with  . Follow-up    Subjective: Hyperlipidemia Patient presents for Hyperlipidemia/CAD follow up. Currently taking Zocor 20 mg/d and compliance with treatment thus far has been good. He denies myalgias. He is usually adhering to a healthy diet. Exercise: walking The patient is known to have coexisting coronary artery disease. Requesting refill of Nitro, not needing it usual.   Hypertension Patient presents for hypertension follow up. He does monitor home blood pressures. Blood pressures ranging on average from 120's/70's. He stopped the Toprol and is now diet controlled. He is usually adhering to a healthy diet overall. Exercise: walking  LE edema LLE edema for many years. Comes and goes.  He does have compression stockings at home but does not wear them.  He does try to mind his salt intake and tries to stay active.  He does not elevate his legs frequently.  No calf pain or recent injury.  ROS: Heart: Denies chest pain Lungs: Denies SOB  Past Medical History:  Diagnosis Date  . Allergy   . Coronary artery disease   . Hyperlipidemia   . Hypertension   . Personal history of colonic adenomas 05/10/2012  . Skin cancer, basal cell     Objective: BP 140/84 (BP Location: Left Arm, Patient Position: Sitting, Cuff Size: Normal)   Pulse 80   Temp (!) 96.6 F (35.9 C) (Temporal)   Ht 5\' 7"  (1.702 m)   Wt 182 lb 6 oz (82.7 kg)   SpO2 95%   BMI 28.56 kg/m  General: Awake, appears stated age HEENT: MMM Heart: RRR, no RLE edema, + 1+ pitting left lower extremity edema tapering at the knee.  No bruits Lungs: CTAB, no rales, wheezes or rhonchi. 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 F/u in 6 mo or prn. The patient voiced understanding and agreement to the plan.  [place], DO 10/15/18  8:24 AM

## 2018-10-18 ENCOUNTER — Other Ambulatory Visit: Payer: Self-pay | Admitting: Family Medicine

## 2018-10-18 DIAGNOSIS — I25701 Atherosclerosis of coronary artery bypass graft(s), unspecified, with angina pectoris with documented spasm: Secondary | ICD-10-CM

## 2018-10-18 MED ORDER — SIMVASTATIN 20 MG PO TABS: 20.0000 mg | ORAL_TABLET | Freq: Every day | ORAL | 3 refills | Status: DC

## 2018-10-28 ENCOUNTER — Telehealth: Payer: Self-pay | Admitting: Family Medicine

## 2018-10-28 NOTE — Telephone Encounter (Signed)
Medication Refill - Medication:  ketoconazole (NIZORAL) 2 % cream  Patient was advised by pharmacy to call office as it is no longer on medication list. Please advise.   Preferred Pharmacy (with phone number or street name):  [place], [place] [phone] (Phone) [phone] (Fax)   Agent: Please be advised that RX refills may take up to 3 business days. We ask that you follow-up with your pharmacy.

## 2018-10-28 NOTE — Telephone Encounter (Signed)
Please advise 

## 2018-10-29 ENCOUNTER — Other Ambulatory Visit: Payer: Self-pay | Admitting: Family Medicine

## 2018-10-29 MED ORDER — KETOCONAZOLE 2 % EX CREA: TOPICAL_CREAM | Freq: Two times a day (BID) | CUTANEOUS | 1 refills | Status: DC | PRN

## 2019-04-13 ENCOUNTER — Other Ambulatory Visit: Payer: Self-pay

## 2019-04-15 ENCOUNTER — Other Ambulatory Visit: Payer: Self-pay

## 2019-04-18 ENCOUNTER — Other Ambulatory Visit: Payer: Self-pay

## 2019-04-18 ENCOUNTER — Encounter: Payer: Self-pay | Admitting: Family Medicine

## 2019-04-18 ENCOUNTER — Ambulatory Visit (INDEPENDENT_AMBULATORY_CARE_PROVIDER_SITE_OTHER): Payer: Medicare Other | Admitting: Family Medicine

## 2019-04-18 VITALS — BP 140/68 | HR 88 | Temp 96.4°F | Ht 67.0 in | Wt 183.2 lb

## 2019-04-18 DIAGNOSIS — M7630 Iliotibial band syndrome, unspecified leg: Secondary | ICD-10-CM | POA: Diagnosis not present

## 2019-04-18 DIAGNOSIS — I25701 Atherosclerosis of coronary artery bypass graft(s), unspecified, with angina pectoris with documented spasm: Secondary | ICD-10-CM

## 2019-04-18 DIAGNOSIS — R42 Dizziness and giddiness: Secondary | ICD-10-CM

## 2019-04-18 DIAGNOSIS — K5900 Constipation, unspecified: Secondary | ICD-10-CM | POA: Diagnosis not present

## 2019-04-18 DIAGNOSIS — M25551 Pain in right hip: Secondary | ICD-10-CM | POA: Insufficient documentation

## 2019-04-18 LAB — CBC
HCT: 45.3 % (ref 39.0–52.0)
Hemoglobin: 15.4 g/dL (ref 13.0–17.0)
MCHC: 34.1 g/dL (ref 30.0–36.0)
MCV: 91 fl (ref 78.0–100.0)
Platelets: 229 10*3/uL (ref 150.0–400.0)
RBC: 4.98 Mil/uL (ref 4.22–5.81)
RDW: 13.5 % (ref 11.5–15.5)
WBC: 6.4 10*3/uL (ref 4.0–10.5)

## 2019-04-18 LAB — COMPREHENSIVE METABOLIC PANEL
ALT: 9 U/L (ref 0–53)
AST: 15 U/L (ref 0–37)
Albumin: 4.1 g/dL (ref 3.5–5.2)
Alkaline Phosphatase: 77 U/L (ref 39–117)
BUN: 16 mg/dL (ref 6–23)
CO2: 24 mEq/L (ref 19–32)
Calcium: 9.2 mg/dL (ref 8.4–10.5)
Chloride: 108 mEq/L (ref 96–112)
Creatinine, Ser: 1.02 mg/dL (ref 0.40–1.50)
GFR: 69.59 mL/min (ref >60.00)
Glucose, Bld: 99 mg/dL (ref 70–99)
Potassium: 4.1 mEq/L (ref 3.5–5.1)
Sodium: 140 mEq/L (ref 135–145)
Total Bilirubin: 0.6 mg/dL (ref 0.2–1.2)
Total Protein: 6.6 g/dL (ref 6.0–8.3)

## 2019-04-18 LAB — LIPID PANEL
Cholesterol: 105 mg/dL (ref 0–200)
HDL: 36.8 mg/dL — ABNORMAL LOW (ref >39.00)
LDL Cholesterol: 42 mg/dL (ref 0–99)
NonHDL: 68.37
Total CHOL/HDL Ratio: 3
Triglycerides: 134 mg/dL (ref 0.0–149.0)
VLDL: 26.8 mg/dL (ref 0.0–40.0)

## 2019-04-18 NOTE — Progress Notes (Signed)
Chief Complaint  Patient presents with  . Follow-up    Subjective: Hyperlipidemia Patient presents for Hyperlipidemia follow up. Currently taking Zocor 20 mg/d and compliance with treatment thus far has been good. He denies myalgias. He is adhering to a healthy diet. Exercise: walking The patient is known to have coexisting coronary artery disease.  Over past 6 mo, has been having lightheadedness when he stands up. Goes away after a few seconds. Does not drink much water. He is not spinning and has not been sick. PO intake unchanged.   Has been having constipation. Taking metamucil, stopped and it got worse. No abd pain, N/V. +Hx of polyps.  Mole on back.   ROS: Heart: Denies chest pain or palpitations Lungs: Denies SOB or cough  Past Medical History:  Diagnosis Date  . Allergy   . Coronary artery disease   . Hyperlipidemia   . Hypertension   . Personal history of colonic adenomas 05/10/2012  . Skin cancer, basal cell     Objective: BP 140/68 (BP Location: Left Arm, Patient Position: Sitting, Cuff Size: Normal)   Pulse 88   Temp (!) 96.4 F (35.8 C) (Temporal)   Ht 5\' 7"  (1.702 m)   Wt 183 lb 4 oz (83.1 kg)   SpO2 98%   BMI 28.70 kg/m  General: Awake, appears stated age HEENT: MMM Heart: RRR, 2+ pitting b/l LE edema tapering at prox 1/3 of tibia, no bruits Lungs: CTAB, no rales, wheezes or rhonchi. No accessory muscle use MSK: Mild ttp over greater troch bursa, neg [place]inch[place], FABER, FADDIR, log roll b/l Neuro: Gait is normal Skin: Various SK's and lentigos on back.  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 Encouraged hydration. Get up slowly. 2- Cont statin, ck labs. 3- Water increases, trial MiraLAX, call his GI doc if no improvement.  4- Stretches/exercises, heat, ice, Tylenol.  F/u in 1 mo. The patient voiced understanding and agreement to the plan.  [place], DO 04/18/19  10:14 AM

## 2019-04-18 NOTE — Patient Instructions (Addendum)
Try to drink around 55-60 oz of water daily. Try MiraLAX if no improvement.   Keep the diet clean and stay active.  Give [place] 2-3 business days to get the results of your labs back.   Ice/cold pack over area for 10-15 min twice daily.  Heat (pad or rice pillow in microwave) over affected area, 10-15 minutes twice daily.   OK to take Tylenol 1000 mg (2 extra strength tabs) or 975 mg (3 regular strength tabs) every 6 hours as needed.  I am not worried about any spot on your back at this time.   Let us know if you need anything.  Iliotibial Band Syndrome Rehab It is normal to feel mild stretching, pulling, tightness, or discomfort as you do these exercises, but you should stop right away if you feel sudden pain or your pain gets worse.  Stretching and range of motion exercises These exercises warm up your muscles and joints and improve the movement and flexibility of your hip and pelvis. Exercise A: Quadriceps, prone    1. Lie on your abdomen on a firm surface, such as a bed or padded floor. 2. Bend your left / right knee and hold your ankle. If you cannot reach your ankle or pant leg, loop a belt around your foot and grab the belt instead. 3. Gently pull your heel toward your buttocks. Your knee should not slide out to the side. You should feel a stretch in the front of your thigh and knee. 4. Hold this position for 30 seconds. Repeat 2 times. Complete this stretch 3 times per week. Exercise B: Iliotibial band    1. Lie on your side with your left / right leg in the top position. 2. Bend both of your knees and grab your left / right ankle. Stretch out your bottom arm to help you balance. 3. Slowly bring your top knee back so your thigh goes behind your trunk. 4. Slowly lower your top leg toward the floor until you feel a gentle stretch on the outside of your left / right hip and thigh. If you do not feel a stretch and your knee will not fall farther, place the heel of your other foot on  top of your knee and pull your knee down toward the floor with your foot. 5. Hold this position for 30 seconds. Repeat 2 times. Complete this stretch 3 times per week. Strengthening exercises These exercises build strength and endurance in your hip and pelvis. Endurance is the ability to use your muscles for a long time, even after they get tired. Exercise C: Straight leg raises (hip abductors)     1. Lie on your side with your left / right leg in the top position. Lie so your head, shoulder, knee, and hip line up. You may bend your bottom knee to help you balance. 2. Roll your hips slightly forward so your hips are stacked directly over each other and your left / right knee is facing forward. 3. Tense the muscles in your outer thigh and lift your top leg 4-6 inches (10-15 cm). 4. Hold this position for 3 seconds. Repeat for a total of 10 reps. 5. Slowly return to the starting position. Let your muscles relax completely before doing another repetition. Repeat 2 times. Complete this exercise 3 times per week. Exercise D: Straight leg raises (hip extensors) 1. Lie on your abdomen on your bed or a firm surface. You can put a pillow under your hips if that is  more comfortable. 2. Bend your left / right knee so your foot is straight up in the air. 3. Squeeze your buttock muscles and lift your left / right thigh off the bed. Do not let your back arch. 4. Tense this muscle as hard as you can without increasing any knee pain. 5. Hold this position for 2 seconds. Repeat for a total of 10 reps 6. Slowly lower your leg to the starting position and allow it to relax completely. Repeat 2 times. Complete this exercise 3 times per week. Exercise E: Hip hike 1. Stand sideways on a bottom step. Stand on your left / right leg with your other foot unsupported next to the step. You can hold onto the railing or wall if needed for balance. 2. Keep your knees straight and your torso square. Then, lift your left /  right hip up toward the ceiling. 3. Slowly let your left / right hip lower toward the floor, past the starting position. Your foot should get closer to the floor. Do not lean or bend your knees. Repeat 2 times. Complete this exercise 3 times per week.  Document Released: 01/13/2005 Document Revised: 09/18/2015 Document Reviewed: 12/15/2014 Elsevier Interactive Patient Education  [employer].

## 2019-05-18 ENCOUNTER — Other Ambulatory Visit: Payer: Self-pay

## 2019-05-18 ENCOUNTER — Encounter: Payer: Self-pay | Admitting: Family Medicine

## 2019-05-18 ENCOUNTER — Ambulatory Visit (INDEPENDENT_AMBULATORY_CARE_PROVIDER_SITE_OTHER): Payer: Medicare Other | Admitting: Family Medicine

## 2019-05-18 VITALS — BP 138/80 | HR 78 | Temp 96.2°F | Ht 67.0 in | Wt 181.4 lb

## 2019-05-18 DIAGNOSIS — R42 Dizziness and giddiness: Secondary | ICD-10-CM

## 2019-05-18 DIAGNOSIS — M7630 Iliotibial band syndrome, unspecified leg: Secondary | ICD-10-CM

## 2019-05-18 NOTE — Patient Instructions (Addendum)
Send me a message or call in the next 2-3 weeks if the lightheadedness does not improve with increased hydration (55-60 oz daily).  Consider salt tabs.  Continue the stretches/exercises for the IT band/hip. If no improvement, send message/call and we will set you up with physical therapy. If the pain gets worse, I want to see you in the office.  Let us know if you need anything.

## 2019-05-18 NOTE — Progress Notes (Signed)
Chief Complaint  Patient presents with  . Follow-up    Subjective: Patient is a 86 y.o. male here for f/u.  Tx'd for IT band syndrome 1 mo ago w stretches/exercises, ice, heat. Got better, would like to try them more before seeing PT. Still having some pain, worse after prolonged sitting. No new inj or change in activity.   Lightheadedness is unchanged since last visit. He is drinking more water, but estimates it is around 48 oz. No Cp or SOB. Always a/w positional change when he rises. No spinning, N/V.   Past Medical History:  Diagnosis Date  . Allergy   . Coronary artery disease   . Hyperlipidemia   . Hypertension   . Personal history of colonic adenomas 05/10/2012  . Skin cancer, basal cell     Objective: BP 138/80 (BP Location: Left Arm, Patient Position: Sitting, Cuff Size: Normal)   Pulse 78   Temp (!) 96.2 F (35.7 C) (Temporal)   Ht 5\' 7"  (1.702 m)   Wt 181 lb 6 oz (82.3 kg)   SpO2 95%   BMI 28.41 kg/m  General: Awake, appears stated age HEENT: MMM, EOMi Heart: RRR, no bruits Lungs: CTAB, no rales, wheezes or rhonchi. No accessory muscle use MSK: Mild ttp over b/l greater troch, +Ober's b/l, neg log roll Psych: Age appropriate judgment and insight, normal affect and mood  Assessment and Plan: Iliotibial band syndrome, unspecified laterality  Lightheaded - Plan: Ambulatory referral to Cardiology  Cont Stretches/exercises, ice, heat, Tylenol. Will send message/call in 3 weeks if no better and will refer to PT. He will trial salt tabs and increased water, send message in 3 weeks and will refer to cards if no better. Given BP, I don't think midodrine at this time is warranted.  F/u in 5 mo or prn. The patient voiced understanding and agreement to the plan.  [place], DO 05/18/19  9:40 AM

## 2019-08-01 ENCOUNTER — Emergency Department (HOSPITAL_BASED_OUTPATIENT_CLINIC_OR_DEPARTMENT_OTHER): Payer: Medicare Other

## 2019-08-01 ENCOUNTER — Encounter (HOSPITAL_BASED_OUTPATIENT_CLINIC_OR_DEPARTMENT_OTHER): Payer: Self-pay

## 2019-08-01 ENCOUNTER — Other Ambulatory Visit: Payer: Self-pay

## 2019-08-01 ENCOUNTER — Emergency Department (HOSPITAL_BASED_OUTPATIENT_CLINIC_OR_DEPARTMENT_OTHER)
Admission: EM | Admit: 2019-08-01 | Discharge: 2019-08-01 | Disposition: A | Discharge status: Home / Self Care | Payer: Medicare Other | Attending: Emergency Medicine | Admitting: Emergency Medicine

## 2019-08-01 DIAGNOSIS — Z87891 Personal history of nicotine dependence: Secondary | ICD-10-CM | POA: Insufficient documentation

## 2019-08-01 DIAGNOSIS — M25552 Pain in left hip: Secondary | ICD-10-CM | POA: Diagnosis not present

## 2019-08-01 DIAGNOSIS — I2510 Atherosclerotic heart disease of native coronary artery without angina pectoris: Secondary | ICD-10-CM | POA: Insufficient documentation

## 2019-08-01 DIAGNOSIS — R42 Dizziness and giddiness: Secondary | ICD-10-CM | POA: Diagnosis not present

## 2019-08-01 DIAGNOSIS — M25551 Pain in right hip: Secondary | ICD-10-CM | POA: Diagnosis present

## 2019-08-01 DIAGNOSIS — I10 Essential (primary) hypertension: Secondary | ICD-10-CM | POA: Diagnosis not present

## 2019-08-01 DIAGNOSIS — Z85828 Personal history of other malignant neoplasm of skin: Secondary | ICD-10-CM | POA: Insufficient documentation

## 2019-08-01 DIAGNOSIS — M549 Dorsalgia, unspecified: Secondary | ICD-10-CM | POA: Diagnosis not present

## 2019-08-01 MED ORDER — OXYCODONE HCL 5 MG PO TABS: 5.0000 mg | ORAL_TABLET | Freq: Four times a day (QID) | ORAL | 0 refills | Status: AC | PRN

## 2019-08-01 MED ORDER — LIDOCAINE 5 % EX PTCH
1.0000 | MEDICATED_PATCH | CUTANEOUS | 0 refills | Status: AC
Start: 2019-08-01 — End: 2019-08-06

## 2019-08-01 MED ORDER — HYDROCODONE-ACETAMINOPHEN 5-325 MG PO TABS
1.0000 | ORAL_TABLET | Freq: Once | ORAL | Status: AC
  Administered 2019-08-01: 1 via ORAL
  Filled 2019-08-01: qty 1

## 2019-08-01 MED ORDER — CYCLOBENZAPRINE HCL 5 MG PO TABS
5.0000 mg | ORAL_TABLET | Freq: Once | ORAL | Status: AC
  Administered 2019-08-01: 5 mg via ORAL
  Filled 2019-08-01: qty 1

## 2019-08-01 MED ORDER — LIDOCAINE 5 % EX PTCH
1.0000 | MEDICATED_PATCH | CUTANEOUS | Status: DC
  Administered 2019-08-01: 1 via TRANSDERMAL
  Filled 2019-08-01: qty 1

## 2019-08-01 MED ORDER — METHYLPREDNISOLONE 4 MG PO TBPK
ORAL_TABLET | ORAL | 0 refills | Status: DC
Start: 2019-08-01 — End: 2019-09-13

## 2019-08-01 MED ORDER — ACETAMINOPHEN 325 MG PO TABS
650.0000 mg | ORAL_TABLET | Freq: Once | ORAL | Status: AC
  Administered 2019-08-01: 650 mg via ORAL
  Filled 2019-08-01: qty 2

## 2019-08-01 MED ORDER — OXYCODONE-ACETAMINOPHEN 5-325 MG PO TABS
1.0000 | ORAL_TABLET | Freq: Once | ORAL | Status: DC
  Filled 2019-08-01: qty 1

## 2019-08-01 MED ORDER — CYCLOBENZAPRINE HCL 10 MG PO TABS
10.0000 mg | ORAL_TABLET | Freq: Three times a day (TID) | ORAL | 0 refills | Status: DC
Start: 2019-08-01 — End: 2019-08-15

## 2019-08-01 NOTE — Discharge Instructions (Signed)
You were seen in the ER for right hip and back pain  Given your symptoms and physical exam findings, I suspect you have an soft tissue or muscular injury. This could be muscular spasm or strain.  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   We will treat your pain and inflammation with the following medication regimen: Steroid taper Flexeril 5 mg every 8 hours Acetaminophen 500 mg every 8 hours Oxycodone 5 mg every 6 hours for severe pain Heating pad as needed Over the counter lidocaine patches can be helpful  Avoid any exacerbating activities for the next 48 hours.  After 48 hours, start doing light back range of motion exercises and walking to avoid worsening back stiffness.   Return for fevers, chills, abdominal pain, changes in bowel movement, urinary symptoms, groin numbness, loss of bladder or bowel control, numbness weakness or heaviness to your extremities, rash.

## 2019-08-01 NOTE — ED Triage Notes (Signed)
Per EMS:  Sudden right hip pain this am while sitting in the chair.  Pt has history of same for last year.  VSS

## 2019-08-01 NOTE — ED Triage Notes (Signed)
Pt states he woke up this morning with significant hip pain that "made me sick" Pt reports becoming sweaty and dizzy.
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
## 2019-08-01 NOTE — ED Notes (Signed)
Sudden onset of rt hip pain while sitting this am  No pain at present  ambulatory to er stretcher from ems stretcher

## 2019-08-08 ENCOUNTER — Other Ambulatory Visit: Payer: Self-pay

## 2019-08-08 ENCOUNTER — Ambulatory Visit (INDEPENDENT_AMBULATORY_CARE_PROVIDER_SITE_OTHER): Payer: Medicare Other | Admitting: Family Medicine

## 2019-08-08 ENCOUNTER — Encounter: Payer: Self-pay | Admitting: Family Medicine

## 2019-08-08 VITALS — BP 153/84 | HR 91 | Ht 67.0 in | Wt 175.0 lb

## 2019-08-08 DIAGNOSIS — M5416 Radiculopathy, lumbar region: Secondary | ICD-10-CM | POA: Insufficient documentation

## 2019-08-08 MED ORDER — HYDROCODONE-ACETAMINOPHEN 5-325 MG PO TABS: 1.0000 | ORAL_TABLET | Freq: Two times a day (BID) | ORAL | 0 refills | Status: DC | PRN

## 2019-08-08 MED ORDER — METHYLPREDNISOLONE ACETATE 40 MG/ML IJ SUSP
40.0000 mg | Freq: Once | INTRAMUSCULAR | Status: AC
  Administered 2019-08-08: 40 mg via INTRAMUSCULAR
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
## 2019-08-08 NOTE — Progress Notes (Signed)
Robert Pacheco - 86 y.o. male MRN [id number]  Date of birth: 20-Sep-1933  SUBJECTIVE:  Including CC & ROS.  Chief Complaint  Patient presents with  . Hip Pain    right side    Robert Pacheco is a 86 y.o. male that is  Presenting with acute right hip posterior pain.  Has been ongoing for a few weeks.  Has acutely gotten worse.  He feels over the posterior aspect of the hip as well as rating down to the lateral aspect of the leg.  No previous surgery.  Has been doing home exercises.  He feels he may have exacerbated with his home exercises.  No numbness or tingling..  Independent review of the right hip x-ray from 7/5 shows mild degenerative changes of the pelvis.  She has moderate bilateral degenerative changes. Independent review   Review of Systems See HPI   HISTORY: Past Medical, Surgical, Social, and Family History Reviewed & Updated per EMR.   Pertinent Historical Findings include:  Past Medical History:  Diagnosis Date  . Allergy   . Coronary artery disease   . Hyperlipidemia   . Hypertension   . Personal history of colonic adenomas 05/10/2012  . Skin cancer, basal cell     Past Surgical History:  Procedure Laterality Date  . CATARACT EXTRACTION    . COLONOSCOPY  multiple  . CORONARY ARTERY BYPASS GRAFT  2000  . EYE SURGERY    . HERNIA REPAIR    . MOHS SURGERY      Family History  Problem Relation Age of Onset  . Heart disease Father   . Colon cancer Brother 97  . Healthy Child        x 4  . Healthy Grandchild        x 11  . Colon cancer Sister 80  . Prostate cancer Brother     Social History   Socioeconomic History  . Marital status: Married    Spouse name: Not on file  . Number of children: 4  . Years of education: Not on file  . Highest education level: Not on file  Occupational History    Comment: [employer]  Tobacco Use  . Smoking status: Former Research scientist (life sciences)  . Smokeless tobacco: Never Used  Vaping Use  . Vaping Use: Never used  Substance and  Sexual Activity  . Alcohol use: No  . Drug use: No  . Sexual activity: Not on file  Other Topics Concern  . Not on file  Social History Narrative  . Not on file   Social Determinants of Health   Financial Resource Strain:   . Difficulty of Paying Living Expenses:   Food Insecurity:   . Worried About Charity fundraiser in the Last Year:   . Arboriculturist in the Last Year:   Transportation Needs:   . Film/video editor (Medical):   [name] Lack of Transportation (Non-Medical):   Physical Activity:   . Days of Exercise per Week:   . Minutes of Exercise per Session:   Stress:   . Feeling of Stress :   Social Connections:   . Frequency of Communication with Friends and Family:   . Frequency of Social Gatherings with Friends and Family:   . Attends Religious Services:   . Active Member of Clubs or Organizations:   . Attends Archivist Meetings:   [name] Marital Status:   Intimate Partner Violence:   . Fear of Current or  Ex-Partner:   . Emotionally Abused:   [name] Physically Abused:   . Sexually Abused:      PHYSICAL EXAM:  VS: BP (!) 153/84   Pulse 91   Ht 5\' 7"  (1.702 m)   Wt 175 lb (79.4 kg)   BMI 27.41 kg/m  Physical Exam Gen: NAD, alert, cooperative with exam, well-appearing MSK:  Back: Pain with standing. Tenderness palpation of the gluteus minimus and medius. No tenderness palpation of the greater trochanter. No tenderness palpation of the SI joint. Normal internal and external rotation of the hip. Positive straight leg raise. Neurovascular intact     ASSESSMENT & PLAN:   Lumbar radiculopathy Pain is acutely gotten worse.  He has significant structural deformities of his lumbar spine.  He was performing home exercises that may have exacerbated some of this pain.  May have SI joint involvement.   -Counseled on home exercise therapy and supportive care. -IM Depo. -Pennsaid sample. -Could consider physical therapy. -Could consider SI joint injection  or greater choke injection - Has a history of compression fracture.  May need to evaluate bone density.

## 2019-08-08 NOTE — Patient Instructions (Signed)
Nice to meet you Please take 500 mg tylenol three times per day  Please try heat  Please take norco for severe pain.  Please try pennsaid over the area Please optimize your vitamin d and calcium  Please send me a message in MyChart with any questions or updates.  Please see me back in 2 weeks.   --Dr. Raeford Razor

## 2019-08-09 ENCOUNTER — Telehealth: Payer: Self-pay | Admitting: Family Medicine

## 2019-08-09 NOTE — Telephone Encounter (Signed)
Pt's wife called states they forgot to ask if it was okay for him to continue taking his daily 81 MG asprin with the new Rx (hydrocodine/ tylenol) together.  -Please advise them @ [phone].   --glh

## 2019-08-10 NOTE — Telephone Encounter (Signed)
Informed that patient can still take aspirin.   Rosemarie Ax, MD [facility] 08/10/2019, 9:10 AM

## 2019-08-11 ENCOUNTER — Telehealth: Payer: Self-pay | Admitting: Family Medicine

## 2019-08-11 NOTE — Telephone Encounter (Signed)
Patient called states he hasn't been able to have a bowel movement even with laxatives in 2 dys since starting the :  HYDROcodone-acetaminophen (NORCO/VICODIN) 5-325 MG tablet [[id number]]   Order Details Dose: 1 tablet Route: Oral Frequency: 2 times daily PRN   ---Please advised them on what else can be done. --Pt req call back @ [phone].  --glh

## 2019-08-11 NOTE — Telephone Encounter (Signed)
Spoke with patient about symptoms. He reports not having a bowel movement for 9 days.  Counseled on taking MiraLAX to achieve improvement.  Counseled if he does not achieve a movement.,  We may need to add Colace or stimulant.  Rosemarie Ax, MD [facility] 08/11/2019, 11:01 AM

## 2019-08-15 ENCOUNTER — Other Ambulatory Visit: Payer: Self-pay | Admitting: *Deleted

## 2019-08-15 MED ORDER — PREDNISONE 10 MG (21) PO TBPK
ORAL_TABLET | ORAL | 0 refills | Status: DC
Start: 2019-08-15 — End: 2019-09-13

## 2019-08-15 MED ORDER — CYCLOBENZAPRINE HCL 10 MG PO TABS
ORAL_TABLET | ORAL | 0 refills | Status: DC
Start: 2019-08-15 — End: 2019-10-25

## 2019-08-23 ENCOUNTER — Other Ambulatory Visit: Payer: Self-pay

## 2019-08-23 ENCOUNTER — Encounter: Payer: Self-pay | Admitting: Family Medicine

## 2019-08-23 ENCOUNTER — Ambulatory Visit (INDEPENDENT_AMBULATORY_CARE_PROVIDER_SITE_OTHER): Payer: Medicare Other | Admitting: Family Medicine

## 2019-08-23 DIAGNOSIS — M5416 Radiculopathy, lumbar region: Secondary | ICD-10-CM

## 2019-08-23 DIAGNOSIS — M25551 Pain in right hip: Secondary | ICD-10-CM | POA: Diagnosis not present

## 2019-08-23 NOTE — Patient Instructions (Signed)
Good to see you Please try heat and ice  Please try the exercises   Please send me a message in MyChart with any questions or updates.  Please see me back in 6 weeks.   --Dr. Raeford Razor

## 2019-08-23 NOTE — Assessment & Plan Note (Addendum)
Pain is improved that appear to be more radicular in nature.  He is able to control the pain with topical lidocaine and Tylenol. -Counseled on home exercise therapy and supportive care. -Could consider physical therapy.

## 2019-08-23 NOTE — Progress Notes (Signed)
Robert Pacheco - 86 y.o. male MRN [id number]  Date of birth: 29-Jul-1933  SUBJECTIVE:  Including CC & ROS.  Chief Complaint  Patient presents with  . Follow-up    right hip    Robert Pacheco is a 86 y.o. male that is following up for his right leg pain.  His pain is improved but still has pain in the right lateral hip.  Seems to be worse at night.  He does get improvement with the topical lidocaine and Tylenol.  He is able to do more of his normal activities.   Review of Systems See HPI   HISTORY: Past Medical, Surgical, Social, and Family History Reviewed & Updated per EMR.   Pertinent Historical Findings include:  Past Medical History:  Diagnosis Date  . Allergy   . Coronary artery disease   . Hyperlipidemia   . Hypertension   . Personal history of colonic adenomas 05/10/2012  . Skin cancer, basal cell     Past Surgical History:  Procedure Laterality Date  . CATARACT EXTRACTION    . COLONOSCOPY  multiple  . CORONARY ARTERY BYPASS GRAFT  2000  . EYE SURGERY    . HERNIA REPAIR    . MOHS SURGERY      Family History  Problem Relation Age of Onset  . Heart disease Father   . Colon cancer Brother 27  . Healthy Child        x 4  . Healthy Grandchild        x 11  . Colon cancer Sister 36  . Prostate cancer Brother     Social History   Socioeconomic History  . Marital status: Married    Spouse name: Not on file  . Number of children: 4  . Years of education: Not on file  . Highest education level: Not on file  Occupational History    Comment: [employer]  Tobacco Use  . Smoking status: Former Research scientist (life sciences)  . Smokeless tobacco: Never Used  Vaping Use  . Vaping Use: Never used  Substance and Sexual Activity  . Alcohol use: No  . Drug use: No  . Sexual activity: Not on file  Other Topics Concern  . Not on file  Social History Narrative  . Not on file   Social Determinants of Health   Financial Resource Strain:   . Difficulty of Paying Living Expenses:     Food Insecurity:   . Worried About Charity fundraiser in the Last Year:   . Arboriculturist in the Last Year:   Transportation Needs:   . Film/video editor (Medical):   [name] Lack of Transportation (Non-Medical):   Physical Activity:   . Days of Exercise per Week:   . Minutes of Exercise per Session:   Stress:   . Feeling of Stress :   Social Connections:   . Frequency of Communication with Friends and Family:   . Frequency of Social Gatherings with Friends and Family:   . Attends Religious Services:   . Active Member of Clubs or Organizations:   . Attends Archivist Meetings:   [name] Marital Status:   Intimate Partner Violence:   . Fear of Current or Ex-Partner:   . Emotionally Abused:   [name] Physically Abused:   . Sexually Abused:      PHYSICAL EXAM:  VS: BP (!) 158/88   Pulse 89   Ht 5\' 7"  (1.702 m)   Wt 172 lb (78  kg)   BMI 26.94 kg/m  Physical Exam Gen: NAD, alert, cooperative with exam, well-appearing MSK:  Right hip: Normal internal and external rotation. Tenderness to palpation of the greater trochanter. Normal strength resistance with hip flexion. Neurovascular intact     ASSESSMENT & PLAN:   Lumbar radiculopathy Pain is improved that appear to be more radicular in nature.  He is able to control the pain with topical lidocaine and Tylenol. -Counseled on home exercise therapy and supportive care. -Could consider physical therapy.  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  May be component of radicular symptoms. -Counseled on home exercise therapy and supportive care. -Could consider injection if ongoing.

## 2019-08-23 NOTE — Assessment & Plan Note (Signed)
Having lateral hip pain which seems more bursitis in nature.  May be component of radicular symptoms. -Counseled on home exercise therapy and supportive care. -Could consider injection if ongoing.

## 2019-09-13 ENCOUNTER — Ambulatory Visit (INDEPENDENT_AMBULATORY_CARE_PROVIDER_SITE_OTHER): Payer: Medicare Other | Admitting: Family Medicine

## 2019-09-13 ENCOUNTER — Other Ambulatory Visit: Payer: Self-pay

## 2019-09-13 ENCOUNTER — Encounter: Payer: Self-pay | Admitting: Family Medicine

## 2019-09-13 VITALS — BP 140/76 | HR 83 | Temp 97.9°F | Ht 67.0 in | Wt 171.5 lb

## 2019-09-13 DIAGNOSIS — R195 Other fecal abnormalities: Secondary | ICD-10-CM

## 2019-09-13 NOTE — Progress Notes (Signed)
Chief Complaint  Patient presents with  . Follow-up    Digestive problems    Robert Pacheco is 86 y.o. male here for complaint of looser stool.  Duration: 1 month Abdominal pain? No Bleeding? No Recent travel? No Recent antibiotic use? No Therapies tried: MiraLAX seemed to incite, last dosage around 1 week ago  Past Medical History:  Diagnosis Date  . Allergy   . Coronary artery disease   . Hyperlipidemia   . Hypertension   . Personal history of colonic adenomas 05/10/2012  . Skin cancer, basal cell     BP 140/76 (BP Location: Left Arm, Patient Position: Sitting, Cuff Size: Normal)   Pulse 83   Temp 97.9 F (36.6 C) (Oral)   Ht 5\' 7"  (1.702 m)   Wt 171 lb 8 oz (77.8 kg)   SpO2 98%   BMI 26.86 kg/m  Gen: awake, alert, appearing stated age HEENT: MMM Heart: RRR, no LE edema Lungs: CTAB, no accessory muscle use Abd: BS+, soft, TTP, non-distended, no masses or organomegaly Psych: Age appropriate judgment and insight  Change in stool  Stay hydrated.  Add Metamucil daily.  I think he needs add bulk to his stool.  If no improvement, could consider stool culture versus referral to gastroenterology. F/u prn. The patient voiced understanding and agreement to the plan.  [place], DO 09/13/19 12:47 PM

## 2019-09-13 NOTE — Patient Instructions (Addendum)
Stay hydrated with around 55-60 oz of water daily.  Take Metamucil daily for the next 1-2 weeks. If no improvement, let me know.  Don't take any other laxatives or bowel aids.   Let us know if you need anything.

## 2019-10-11 ENCOUNTER — Ambulatory Visit: Payer: Medicare Other | Admitting: Family Medicine

## 2019-10-17 ENCOUNTER — Ambulatory Visit: Payer: Self-pay | Admitting: *Deleted

## 2019-10-21 ENCOUNTER — Ambulatory Visit: Payer: Medicare Other | Admitting: Family Medicine

## 2019-10-25 ENCOUNTER — Other Ambulatory Visit: Payer: Self-pay

## 2019-10-25 ENCOUNTER — Encounter: Payer: Self-pay | Admitting: Family Medicine

## 2019-10-25 ENCOUNTER — Ambulatory Visit (INDEPENDENT_AMBULATORY_CARE_PROVIDER_SITE_OTHER): Payer: Medicare Other | Admitting: Family Medicine

## 2019-10-25 VITALS — BP 110/68 | HR 86 | Temp 98.3°F | Ht 67.0 in | Wt 173.2 lb

## 2019-10-25 DIAGNOSIS — K644 Residual hemorrhoidal skin tags: Secondary | ICD-10-CM | POA: Diagnosis not present

## 2019-10-25 DIAGNOSIS — K5900 Constipation, unspecified: Secondary | ICD-10-CM

## 2019-10-25 DIAGNOSIS — K921 Melena: Secondary | ICD-10-CM | POA: Diagnosis not present

## 2019-10-25 DIAGNOSIS — E785 Hyperlipidemia, unspecified: Secondary | ICD-10-CM

## 2019-10-25 DIAGNOSIS — I10 Essential (primary) hypertension: Secondary | ICD-10-CM

## 2019-10-25 DIAGNOSIS — Z23 Encounter for immunization: Secondary | ICD-10-CM | POA: Diagnosis not present

## 2019-10-25 NOTE — Patient Instructions (Addendum)
Try to drink 50-55 oz of water daily.    Try MiraLAX (or other laxative) 1-2 times daily over the next 3-4 days. If no improvement, try using an enema or suppository. Stay well hydrated and keep lots of fiber in your diet.  Send me a message or call at the end of next week (10/6ish) if things are not better.    Keep the diet clean and stay active.  Because your blood pressure is well-controlled, you no longer have to check your blood pressure at home anymore unless you wish. Some people check it twice daily every day and some people stop altogether. Either or anything in between is fine. Strong work!   If you keep bleeding with regular bowel movements, let me know.   Let us know if you need anything.

## 2019-10-25 NOTE — Progress Notes (Signed)
Chief Complaint  Patient presents with  . Follow-up    Subjective Robert Pacheco is a 86 y.o. male who presents for hypertension follow up. He does monitor home blood pressures. Usually runs at 110-120's/does not remember.  He is diet controlled.  He is adhering to a healthy diet overall. Current exercise: little exercise at this time  Hyperlipidemia Patient presents for dyslipidemia follow up. Currently being treated with Zocor 20 mg/d and compliance with treatment thus far has been good. He denies myalgias. Diet/exercise as above.   1.5 weeks ago approx, has had issues w constipation. He was taking Metamucil that did help with his change in stool from 8/17. Unsure how much water he is drinking daily. Noticed some bright red blood on toilet tissue. He is still passing gas. Used a suppository around 1 week ago, used Dulcolax around 4 days ago and had a nice BM. Has not had since then. No change in diet, unintentional wt loss, abd pain, N/V.   Past Medical History:  Diagnosis Date  . Allergy   . Coronary artery disease   . Hyperlipidemia   . Hypertension   . Personal history of colonic adenomas 05/10/2012  . Skin cancer, basal cell     Exam BP 110/68 (BP Location: Left Arm, Patient Position: Sitting, Cuff Size: Normal)   Pulse 86   Temp 98.3 F (36.8 C) (Oral)   Ht 5\' 7"  (1.702 m)   Wt 173 lb 4 oz (78.6 kg)   SpO2 98%   BMI 27.13 kg/m  General:  well developed, well nourished, in no apparent distress Heart: RRR, no bruits, no LE edema Lungs: clear to auscultation, no accessory muscle use Abd: BS+, moderately distended, no ttp Rectal: +ext hemorrhoids noted, no fissures or other lesions. Psych: well oriented with normal range of affect and appropriate judgment/insight  Blood in stool  Essential hypertension  Hyperlipidemia, unspecified hyperlipidemia type - Plan: Comprehensive metabolic panel, Lipid panel  External hemorrhoids  Constipation, unspecified  constipation type  Need for influenza vaccination - Plan: Flu Vaccine QUAD 6+ mos PF IM (Fluarix Quad PF)  BP doing well. Will ck cholesterol labs, cont statin. Counseled on diet/exercise. MiraLAX bid for 3 d followed by enema. Will refer to GI if still having issues. May need to increase water intake. OK to cont prep H.  F/u in 6 mo or prn.  The patient voiced understanding and agreement to the plan.  [place], DO 10/25/19  8:20 AM

## 2019-10-26 LAB — COMPREHENSIVE METABOLIC PANEL
AG Ratio: 2.1 (calc) (ref 1.0–2.5)
ALT: 10 U/L (ref 9–46)
AST: 17 U/L (ref 10–35)
Albumin: 4.1 g/dL (ref 3.6–5.1)
Alkaline phosphatase (APISO): 62 U/L (ref 35–144)
BUN: 13 mg/dL (ref 7–25)
CO2: 26 mmol/L (ref 20–32)
Calcium: 9.6 mg/dL (ref 8.6–10.3)
Chloride: 107 mmol/L (ref 98–110)
Creat: 1.03 mg/dL (ref 0.70–1.11)
Globulin: 2 g/dL (calc) (ref 1.9–3.7)
Glucose, Bld: 88 mg/dL (ref 65–99)
Potassium: 4.5 mmol/L (ref 3.5–5.3)
Sodium: 141 mmol/L (ref 135–146)
Total Bilirubin: 0.8 mg/dL (ref 0.2–1.2)
Total Protein: 6.1 g/dL (ref 6.1–8.1)

## 2019-10-26 LAB — LIPID PANEL
Cholesterol: 114 mg/dL (ref <200)
HDL: 39 mg/dL — ABNORMAL LOW (ref >=40)
LDL Cholesterol (Calc): 56 mg/dL (calc)
Non-HDL Cholesterol (Calc): 75 mg/dL (calc) (ref <130)
Total CHOL/HDL Ratio: 2.9 (calc) (ref <5.0)
Triglycerides: 111 mg/dL (ref <150)

## 2019-11-04 ENCOUNTER — Telehealth: Payer: Self-pay | Admitting: Family Medicine

## 2019-11-04 DIAGNOSIS — I25701 Atherosclerosis of coronary artery bypass graft(s), unspecified, with angina pectoris with documented spasm: Secondary | ICD-10-CM

## 2019-11-04 MED ORDER — SIMVASTATIN 20 MG PO TABS: 20.0000 mg | ORAL_TABLET | Freq: Every day | ORAL | 3 refills | Status: DC

## 2019-11-04 NOTE — Telephone Encounter (Signed)
Caller : Robert Pacheco  Call Back # [phone]  Patient states Dr Nani Ravens, asked him to call back to give an update of his status.   Patient is requesting to speak with Nurse of Dr Serita Sheller.

## 2019-11-04 NOTE — Telephone Encounter (Signed)
Called informed of PCP response.

## 2019-11-04 NOTE — Telephone Encounter (Signed)
Fiber supp like Metamucil can be helpful, adequate hydration, and intermittent use of MiraLAX if needed. Ty.

## 2019-11-04 NOTE — Telephone Encounter (Signed)
Called left message to call back 

## 2019-11-04 NOTE — Telephone Encounter (Signed)
The patient called back and stated hemorrhoids are better. Wanted to know if PCP would recommended anything to take daily to keep BM's regular.

## 2019-12-06 ENCOUNTER — Telehealth: Payer: Self-pay | Admitting: Family Medicine

## 2019-12-06 NOTE — Telephone Encounter (Signed)
Left message for patient to call back and schedule Medicare Annual Wellness Visit (AWV) with Nurse Health Advisor. This can be scheduled IN OFFICE or TELEPHONE VISIT.   This should be a 40 minute visit  Last AWV 10/15/18

## 2019-12-15 ENCOUNTER — Ambulatory Visit (INDEPENDENT_AMBULATORY_CARE_PROVIDER_SITE_OTHER): Payer: Medicare Other

## 2019-12-15 VITALS — Ht 67.0 in | Wt 173.0 lb

## 2019-12-15 DIAGNOSIS — Z0000 Encounter for general adult medical examination without abnormal findings: Secondary | ICD-10-CM

## 2019-12-15 NOTE — Progress Notes (Signed)
Subjective:   Robert Pacheco is a 86 y.o. male who presents for Medicare Annual/Subsequent preventive examination.  I connected with Terrance today by telephone and verified that I am speaking with the correct person using two identifiers. Location patient: home Location provider: work Persons participating in the virtual visit: patient, Marine scientist.    I discussed the limitations, risks, security and privacy concerns of performing an evaluation and management service by telephone and the availability of in person appointments. I also discussed with the patient that there may be a patient responsible charge related to this service. The patient expressed understanding and verbally consented to this telephonic visit.    Interactive audio and video telecommunications were attempted between this provider and patient, however failed, due to patient having technical difficulties OR patient did not have access to video capability.  We continued and completed visit with audio only.  Some vital signs may be absent or patient reported.   Time Spent with patient on telephone encounter: 20 minutes  Review of Systems     Cardiac Risk Factors include: advanced age (>41men, >6 women);male gender;hypertension;dyslipidemia     Objective:    Today's Vitals   12/15/19 1522  Weight: 173 lb (78.5 kg)  Height: 5\' 7"  (1.702 m)   Body mass index is 27.1 kg/m.  Advanced Directives 12/15/2019 08/01/2019 10/15/2018 10/12/2017 10/08/2016 08/09/2016 08/07/2016  Does Patient Have a Medical Advance Directive? Yes No Yes No Yes No Yes  Type of Paramedic of [place];Living will - [place];Living will - [place];Living will - Living will  Does patient want to make changes to medical advance directive? - - No - Patient declined - - - -  Copy of [place] in Chart? No - copy requested - No - copy requested - - - -  Would patient like information  on creating a medical advance directive? - No - Patient declined - No - Patient declined - - -    Current Medications (verified) Outpatient Encounter Medications as of 12/15/2019  Medication Sig  . aspirin 81 MG chewable tablet Chew 81 mg by mouth.  [name] ketoconazole (NIZORAL) 2 % cream Apply topically 2 (two) times daily as needed. APPLY CREAM TOPICALLY TWICE DAILY  . NITROSTAT 0.4 MG SL tablet DISSOLVE ONE TABLET UNDER THE TONGUE EVERY 5 MINUTES AS NEEDED.  [name] simvastatin (ZOCOR) 20 MG tablet Take 1 tablet (20 mg total) by mouth at bedtime.   No facility-administered encounter medications on file as of 12/15/2019.    Allergies (verified) Other   History: Past Medical History:  Diagnosis Date  . Allergy   . Coronary artery disease   . Hyperlipidemia   . Hypertension   . Personal history of colonic adenomas 05/10/2012  . Skin cancer, basal cell    Past Surgical History:  Procedure Laterality Date  . CATARACT EXTRACTION    . COLONOSCOPY  multiple  . CORONARY ARTERY BYPASS GRAFT  2000  . EYE SURGERY    . HERNIA REPAIR    . MOHS SURGERY     Family History  Problem Relation Age of Onset  . Heart disease Father   . Colon cancer Brother 65  . Healthy Child        x 4  . Healthy Grandchild        x 11  . Colon cancer Sister 7  . Prostate cancer Brother    Social History   Socioeconomic History  .  Marital status: Married    Spouse name: Not on file  . Number of children: 4  . Years of education: Not on file  . Highest education level: Not on file  Occupational History  . Occupation: retired    Comment: Education administrator  Tobacco Use  . Smoking status: Former Research scientist (life sciences)  . Smokeless tobacco: Never Used  Vaping Use  . Vaping Use: Never used  Substance and Sexual Activity  . Alcohol use: No  . Drug use: No  . Sexual activity: Not on file  Other Topics Concern  . Not on file  Social History Narrative  . Not on file   Social Determinants of Health   Financial  Resource Strain: Low Risk   . Difficulty of Paying Living Expenses: Not hard at all  Food Insecurity: No Food Insecurity  . Worried About Charity fundraiser in the Last Year: Never true  . Ran Out of Food in the Last Year: Never true  Transportation Needs: No Transportation Needs  . Lack of Transportation (Medical): No  . Lack of Transportation (Non-Medical): No  Physical Activity: Inactive  . Days of Exercise per Week: 0 days  . Minutes of Exercise per Session: 0 min  Stress: No Stress Concern Present  . Feeling of Stress : Not at all  Social Connections: Moderately Integrated  . Frequency of Communication with Friends and Family: More than three times a week  . Frequency of Social Gatherings with Friends and Family: Once a week  . Attends Religious Services: More than 4 times per year  . Active Member of Clubs or Organizations: No  . Attends Archivist Meetings: Never  . Marital Status: Married    Tobacco Counseling Counseling given: Not Answered   Clinical Intake:  Pre-visit preparation completed: Yes  Pain : No/denies pain     Nutritional Status: BMI 25 -29 Overweight Nutritional Risks: None Diabetes: No  How often do you need to have someone help you when you read instructions, pamphlets, or other written materials from your doctor or pharmacy?: 1 - Never What is the last grade level you completed in school?: 9th grade  Diabetic?No  Interpreter Needed?: No  Information entered by :: Caroleen Hamman LPN   Activities of Daily Living In your present state of health, do you have any difficulty performing the following activities: 12/15/2019 10/25/2019  Hearing? Y N  Comment mild -  Vision? N N  Difficulty concentrating or making decisions? N N  Walking or climbing stairs? N N  Dressing or bathing? N N  Doing errands, shopping? N N  Preparing Food and eating ? N -  Using the Toilet? N -  In the past six months, have you accidently leaked urine? N -    Do you have problems with loss of bowel control? N -  Managing your Medications? N -  Managing your Finances? N -  Housekeeping or managing your Housekeeping? N -  Some recent data might be hidden    Patient Care Team: Shelda Pal, DO as PCP - General (Family Medicine)  Indicate any recent Medical Services you may have received from other than [facility] providers in the past year (date may be approximate).     Assessment:   This is a routine wellness examination for [place].  Hearing/Vision screen  Hearing Screening   125Hz  250Hz  500Hz  1000Hz  2000Hz  3000Hz  4000Hz  6000Hz  8000Hz   Right ear:           Left ear:  Comments: Mild hearing loss  Vision Screening Comments: Cataracts removed Wears reading glasses Last eye exam-10/2019  Dietary issues and exercise activities discussed: Current Exercise Habits: The patient does not participate in regular exercise at present, Exercise limited by: None identified  Goals      Patient Stated   .  Maintain current health (pt-stated)      Depression Screen PHQ 2/9 Scores 12/15/2019 10/25/2019 10/15/2018 10/12/2017 10/08/2016 09/28/2015 09/11/2014  PHQ - 2 Score 0 0 0 0 0 0 0    Fall Risk Fall Risk  12/15/2019 10/15/2018 10/12/2017 10/08/2016 09/28/2015  Falls in the past year? 0 1 No No Yes  Number falls in past yr: 0 0 - - 1  Comment - - - - Fell off stool in garage last week and bumped head.  Injury with Fall? 0 0 - - No  Follow up Falls prevention discussed - - - -    Any stairs in or around the home? Yes  If so, are there any without handrails? No  Home free of loose throw rugs in walkways, pet beds, electrical cords, etc? Yes  Adequate lighting in your home to reduce risk of falls? Yes   ASSISTIVE DEVICES UTILIZED TO PREVENT FALLS:  Life alert? No  Use of a cane, walker or w/c? No  Grab bars in the bathroom? Yes  Shower chair or bench in shower? No  Elevated toilet seat or a handicapped toilet? No   TIMED UP AND  GO:  Was the test performed? No . Phone visit   Cognitive Function:No cognitive impairment noted MMSE - Mini Mental State Exam 10/12/2017 10/08/2016  Orientation to time 5 5  Orientation to Place 5 5  Registration 3 3  Attention/ Calculation 4 4  Recall 1 2  Language- name 2 objects 2 2  Language- repeat 1 1  Language- follow 3 step command 3 3  Language- read & follow direction 1 1  Write a sentence 1 1  Copy design 1 0  Total score 27 27        Immunizations Immunization History  Administered Date(s) Administered  . Fluad Quad(high Dose 65+) 10/15/2018  . Influenza Split 12/31/2010, 01/14/2012  . Influenza Whole 12/03/2006, 11/10/2007, 10/31/2008, 12/10/2009  . Influenza, High Dose Seasonal PF 12/15/2012, 12/13/2013  . Influenza,inj,Quad PF,6+ Mos 10/25/2019  . Influenza-Unspecified 02/26/2017  . PFIZER SARS-COV-2 Vaccination 02/16/2019, 03/09/2019  . Pneumococcal Conjugate-13 07/28/2013  . Pneumococcal Polysaccharide-23 01/27/2001, 03/02/2007  . Td 01/28/2004  . Tdap 09/11/2014    TDAP status: Up to date   Flu Vaccine status: Up to date   Pneumococcal vaccine status: Up to date   Covid-19 vaccine status: Completed vaccines  Qualifies for Shingles Vaccine? Yes   Zostavax completed No   Shingrix Completed?: No.    Education has been provided regarding the importance of this vaccine. Patient has been advised to call insurance company to determine out of pocket expense if they have not yet received this vaccine. Advised may also receive vaccine at local pharmacy or Health Dept. Verbalized acceptance and understanding.  Screening Tests Health Maintenance  Topic Date Due  . TETANUS/TDAP  09/10/2024  . INFLUENZA VACCINE  Completed  . COVID-19 Vaccine  Completed  . PNA vac Low Risk Adult  Completed    Health Maintenance  There are no preventive care reminders to display for this patient.  Colorectal cancer screening: No longer required.   Lung Cancer  Screening: (Low Dose CT Chest recommended if Age 51-80 years,  30 pack-year currently smoking OR have quit w/in 15years.) does not qualify.     Additional Screening:  Hepatitis C Screening: does not qualify  Vision Screening: Recommended annual ophthalmology exams for early detection of glaucoma and other disorders of the eye. Is the patient up to date with their annual eye exam?  Yes  Who is the provider or what is the name of the office in which the patient attends annual eye exams? Unsure of name   Dental Screening: Recommended annual dental exams for proper oral hygiene  Community Resource Referral / Chronic Care Management: CRR required this visit?  No   CCM required this visit?  No      Plan:     I have personally reviewed and noted the following in the patient's chart:   . Medical and social history . Use of alcohol, tobacco or illicit drugs  . Current medications and supplements . Functional ability and status . Nutritional status . Physical activity . Advanced directives . List of other physicians . Hospitalizations, surgeries, and ER visits in previous 12 months . Vitals . Screenings to include cognitive, depression, and falls . Referrals and appointments  In addition, I have reviewed and discussed with patient certain preventive protocols, quality metrics, and best practice recommendations. A written personalized care plan for preventive services as well as general preventive health recommendations were provided to patient.   Due to this being a telephonic visit, the after visit summary with patients personalized plan was offered to patient via mail or my-chart.  Per request, patient was mailed a copy of [place], LPN   [id number]  Nurse Health Advisor  Nurse Notes: None

## 2019-12-15 NOTE — Patient Instructions (Signed)
Robert Pacheco , Thank you for taking time to complete your Medicare Wellness Visit. I appreciate your ongoing commitment to your health goals. Please review the following plan we discussed and let me know if I can assist you in the future.   Screening recommendations/referrals: Colonoscopy: No longer required Recommended yearly ophthalmology/optometry visit for glaucoma screening and checkup Recommended yearly dental visit for hygiene and checkup  Vaccinations: Influenza vaccine: Up to date Pneumococcal vaccine: Completed vaccines Tdap vaccine: Up to date- Due-09/10/2024 Shingles vaccine: Discuss with pharmacy Covid-19: Completed vaccines  Advanced directives: Please bring a copy for your chart  Conditions/risks identified: See problem list  Next appointment: Follow up in one year for your annual wellness visit.   Preventive Care 55 Years and Older, Male Preventive care refers to lifestyle choices and visits with your health care provider that can promote health and wellness. What does preventive care include?  A yearly physical exam. This is also called an annual well check.  Dental exams once or twice a year.  Routine eye exams. Ask your health care provider how often you should have your eyes checked.  Personal lifestyle choices, including:  Daily care of your teeth and gums.  Regular physical activity.  Eating a healthy diet.  Avoiding tobacco and drug use.  Limiting alcohol use.  Practicing safe sex.  Taking low doses of aspirin every day.  Taking vitamin and mineral supplements as recommended by your health care provider. What happens during an annual well check? The services and screenings done by your health care provider during your annual well check will depend on your age, overall health, lifestyle risk factors, and family history of disease. Counseling  Your health care provider may ask you questions about your:  Alcohol use.  Tobacco use.  Drug  use.  Emotional well-being.  Home and relationship well-being.  Sexual activity.  Eating habits.  History of falls.  Memory and ability to understand (cognition).  Work and work Statistician. Screening  You may have the following tests or measurements:  Height, weight, and BMI.  Blood pressure.  Lipid and cholesterol levels. These may be checked every 5 years, or more frequently if you are over 16 years old.  Skin check.  Lung cancer screening. You may have this screening every year starting at age 34 if you have a 30-pack-year history of smoking and currently smoke or have quit within the past 15 years.  Fecal occult blood test (FOBT) of the stool. You may have this test every year starting at age 51.  Flexible sigmoidoscopy or colonoscopy. You may have a sigmoidoscopy every 5 years or a colonoscopy every 10 years starting at age 52.  Prostate cancer screening. Recommendations will vary depending on your family history and other risks.  Hepatitis C blood test.  Hepatitis B blood test.  Sexually transmitted disease (STD) testing.  Diabetes screening. This is done by checking your blood sugar (glucose) after you have not eaten for a while (fasting). You may have this done every 1-3 years.  Abdominal aortic aneurysm (AAA) screening. You may need this if you are a current or former smoker.  Osteoporosis. You may be screened starting at age 69 if you are at high risk. Talk with your health care provider about your test results, treatment options, and if necessary, the need for more tests. Vaccines  Your health care provider may recommend certain vaccines, such as:  Influenza vaccine. This is recommended every year.  Tetanus, diphtheria, and acellular pertussis (Tdap, Td)  vaccine. You may need a Td booster every 10 years.  Zoster vaccine. You may need this after age 28.  Pneumococcal 13-valent conjugate (PCV13) vaccine. One dose is recommended after age  28.  Pneumococcal polysaccharide (PPSV23) vaccine. One dose is recommended after age 62. Talk to your health care provider about which screenings and vaccines you need and how often you need them. This information is not intended to replace advice given to you by your health care provider. Make sure you discuss any questions you have with your health care provider. Document Released: 02/09/2015 Document Revised: 10/03/2015 Document Reviewed: 11/14/2014 Elsevier Interactive Patient Education  2017 [employer] Prevention in the Home Falls can cause injuries. They can happen to people of all ages. There are many things you can do to make your home safe and to help prevent falls. What can I do on the outside of my home?  Regularly fix the edges of walkways and driveways and fix any cracks.  Remove anything that might make you trip as you walk through a door, such as a raised step or threshold.  Trim any bushes or trees on the path to your home.  Use bright outdoor lighting.  Clear any walking paths of anything that might make someone trip, such as rocks or tools.  Regularly check to see if handrails are loose or broken. Make sure that both sides of any steps have handrails.  Any raised decks and porches should have guardrails on the edges.  Have any leaves, snow, or ice cleared regularly.  Use sand or salt on walking paths during winter.  Clean up any spills in your garage right away. This includes oil or grease spills. What can I do in the bathroom?  Use night lights.  Install grab bars by the toilet and in the tub and shower. Do not use towel bars as grab bars.  Use non-skid mats or decals in the tub or shower.  If you need to sit down in the shower, use a plastic, non-slip stool.  Keep the floor dry. Clean up any water that spills on the floor as soon as it happens.  Remove soap buildup in the tub or shower regularly.  Attach bath mats securely with double-sided  non-slip rug tape.  Do not have throw rugs and other things on the floor that can make you trip. What can I do in the bedroom?  Use night lights.  Make sure that you have a light by your bed that is easy to reach.  Do not use any sheets or blankets that are too big for your bed. They should not hang down onto the floor.  Have a firm chair that has side arms. You can use this for support while you get dressed.  Do not have throw rugs and other things on the floor that can make you trip. What can I do in the kitchen?  Clean up any spills right away.  Avoid walking on wet floors.  Keep items that you use a lot in easy-to-reach places.  If you need to reach something above you, use a strong step stool that has a grab bar.  Keep electrical cords out of the way.  Do not use floor polish or wax that makes floors slippery. If you must use wax, use non-skid floor wax.  Do not have throw rugs and other things on the floor that can make you trip. What can I do with my stairs?  Do not leave  any items on the stairs.  Make sure that there are handrails on both sides of the stairs and use them. Fix handrails that are broken or loose. Make sure that handrails are as long as the stairways.  Check any carpeting to make sure that it is firmly attached to the stairs. Fix any carpet that is loose or worn.  Avoid having throw rugs at the top or bottom of the stairs. If you do have throw rugs, attach them to the floor with carpet tape.  Make sure that you have a light switch at the top of the stairs and the bottom of the stairs. If you do not have them, ask someone to add them for you. What else can I do to help prevent falls?  Wear shoes that:  Do not have high heels.  Have rubber bottoms.  Are comfortable and fit you well.  Are closed at the toe. Do not wear sandals.  If you use a stepladder:  Make sure that it is fully opened. Do not climb a closed stepladder.  Make sure that both  sides of the stepladder are locked into place.  Ask someone to hold it for you, if possible.  Clearly mark and make sure that you can see:  Any grab bars or handrails.  First and last steps.  Where the edge of each step is.  Use tools that help you move around (mobility aids) if they are needed. These include:  Canes.  Walkers.  Scooters.  Crutches.  Turn on the lights when you go into a dark area. Replace any light bulbs as soon as they burn out.  Set up your furniture so you have a clear path. Avoid moving your furniture around.  If any of your floors are uneven, fix them.  If there are any pets around you, be aware of where they are.  Review your medicines with your doctor. Some medicines can make you feel dizzy. This can increase your chance of falling. Ask your doctor what other things that you can do to help prevent falls. This information is not intended to replace advice given to you by your health care provider. Make sure you discuss any questions you have with your health care provider. Document Released: 11/09/2008 Document Revised: 06/21/2015 Document Reviewed: 02/17/2014 Elsevier Interactive Patient Education  2017 [employer].

## 2020-03-08 ENCOUNTER — Other Ambulatory Visit: Payer: Self-pay | Admitting: Family Medicine

## 2020-04-23 ENCOUNTER — Ambulatory Visit: Payer: Medicare Other | Admitting: Family Medicine

## 2020-04-23 ENCOUNTER — Encounter: Payer: Self-pay | Admitting: Family Medicine

## 2020-04-23 ENCOUNTER — Other Ambulatory Visit: Payer: Self-pay

## 2020-04-23 VITALS — BP 140/82 | HR 87 | Temp 97.9°F | Ht 67.0 in | Wt 172.0 lb

## 2020-04-23 DIAGNOSIS — K5900 Constipation, unspecified: Secondary | ICD-10-CM | POA: Diagnosis not present

## 2020-04-23 DIAGNOSIS — N401 Enlarged prostate with lower urinary tract symptoms: Secondary | ICD-10-CM

## 2020-04-23 DIAGNOSIS — I25701 Atherosclerosis of coronary artery bypass graft(s), unspecified, with angina pectoris with documented spasm: Secondary | ICD-10-CM

## 2020-04-23 DIAGNOSIS — E785 Hyperlipidemia, unspecified: Secondary | ICD-10-CM

## 2020-04-23 DIAGNOSIS — R42 Dizziness and giddiness: Secondary | ICD-10-CM

## 2020-04-23 DIAGNOSIS — R351 Nocturia: Secondary | ICD-10-CM

## 2020-04-23 LAB — CBC
HCT: 44.8 % (ref 39.0–52.0)
Hemoglobin: 15.2 g/dL (ref 13.0–17.0)
MCHC: 34 g/dL (ref 30.0–36.0)
MCV: 91.4 fl (ref 78.0–100.0)
Platelets: 230 10*3/uL (ref 150.0–400.0)
RBC: 4.9 Mil/uL (ref 4.22–5.81)
RDW: 13.8 % (ref 11.5–15.5)
WBC: 5.7 10*3/uL (ref 4.0–10.5)

## 2020-04-23 LAB — COMPREHENSIVE METABOLIC PANEL
ALT: 11 U/L (ref 0–53)
AST: 16 U/L (ref 0–37)
Albumin: 4.5 g/dL (ref 3.5–5.2)
Alkaline Phosphatase: 73 U/L (ref 39–117)
BUN: 14 mg/dL (ref 6–23)
CO2: 28 mEq/L (ref 19–32)
Calcium: 9.5 mg/dL (ref 8.4–10.5)
Chloride: 106 mEq/L (ref 96–112)
Creatinine, Ser: 0.97 mg/dL (ref 0.40–1.50)
GFR: 71.46 mL/min (ref >60.00)
Glucose, Bld: 103 mg/dL — ABNORMAL HIGH (ref 70–99)
Potassium: 4.9 mEq/L (ref 3.5–5.1)
Sodium: 141 mEq/L (ref 135–145)
Total Bilirubin: 0.6 mg/dL (ref 0.2–1.2)
Total Protein: 6.7 g/dL (ref 6.0–8.3)

## 2020-04-23 LAB — LIPID PANEL
Cholesterol: 109 mg/dL (ref 0–200)
HDL: 43.2 mg/dL (ref >39.00)
LDL Cholesterol: 53 mg/dL (ref 0–99)
NonHDL: 65.56
Total CHOL/HDL Ratio: 3
Triglycerides: 62 mg/dL (ref 0.0–149.0)
VLDL: 12.4 mg/dL (ref 0.0–40.0)

## 2020-04-23 MED ORDER — TAMSULOSIN HCL 0.4 MG PO CAPS: 0.4000 mg | ORAL_CAPSULE | Freq: Every day | ORAL | 3 refills | Status: DC

## 2020-04-23 MED ORDER — NITROSTAT 0.4 MG SL SUBL: SUBLINGUAL_TABLET | SUBLINGUAL | 1 refills | Status: DC

## 2020-04-23 NOTE — Patient Instructions (Signed)
Try to drink 50 oz of water daily outside of exercise.  OK to use a laxative 2-3 times per week like MiraLAX.  Give [place] 2-3 business days to get the results of your labs back.   Keep the diet clean and stay active.

## 2020-04-23 NOTE — Progress Notes (Signed)
Chief Complaint  Patient presents with  . Follow-up    Subjective: Patient is a 87 y.o. male here for f/u.  Lightheaded Getting slightly better since increasing water.  He gets a lightheaded/feeling off sensation after eating breakfast.  Not really associated with position and last for 1-2 hours at a time.  Drinking around 25 oz/d. Urinating freq.  He also noticed he is more constipated and less he takes Dulcolax, on average around once per week.  He has also tried Metamucil with limited results.  He is not having diarrhea, currently is not having any blood in stool.  Hyperlipidemia Patient presents for dyslipidemia follow up. Currently being treated with Zocor 20 mg/d and compliance with treatment thus far has been good. He denies myalgias. He is adhering to a healthy diet. Exercise: walking The patient is known to have coexisting coronary artery disease. No Cp or SOB.   Past Medical History:  Diagnosis Date  . Allergy   . Coronary artery disease   . Hyperlipidemia   . Hypertension   . Personal history of colonic adenomas 05/10/2012  . Skin cancer, basal cell     Objective: BP 140/82 (BP Location: Left Arm, Patient Position: Sitting, Cuff Size: Normal)   Pulse 87   Temp 97.9 F (36.6 C) (Oral)   Ht 5\' 7"  (1.702 m)   Wt 172 lb (78 kg)   SpO2 99%   BMI 26.94 kg/m  General: Awake, appears stated age HEENT: MMM, EOMi Heart: RRR, no murmurs Lungs: CTAB, no rales, wheezes or rhonchi. No accessory muscle use Abdomen: Soft, nontender, nondistended, bowel sounds present.  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  Check labs.  Would consider cardiology referral if no improvement with water increase. 2.  Continue Zocor.  Check labs. 3.  Increase water intake, okay to use as needed laxative. 4.  Refill nitro. 5.  Trial Flomax.  Less concerned about side effect of orthostatic hypotension given the lack of positioning causing exacerbation of #1. I will see him in 1 month. The patient voiced understanding and agreement to the plan.  [place], DO 04/23/20  9:58 AM

## 2020-05-25 ENCOUNTER — Encounter: Payer: Self-pay | Admitting: Family Medicine

## 2020-05-25 ENCOUNTER — Other Ambulatory Visit: Payer: Self-pay

## 2020-05-25 ENCOUNTER — Ambulatory Visit: Payer: Medicare Other | Admitting: Family Medicine

## 2020-05-25 VITALS — BP 132/68 | HR 77 | Temp 97.6°F | Ht 67.0 in | Wt 174.5 lb

## 2020-05-25 DIAGNOSIS — R351 Nocturia: Secondary | ICD-10-CM | POA: Diagnosis not present

## 2020-05-25 DIAGNOSIS — R42 Dizziness and giddiness: Secondary | ICD-10-CM

## 2020-05-25 DIAGNOSIS — N401 Enlarged prostate with lower urinary tract symptoms: Secondary | ICD-10-CM

## 2020-05-25 MED ORDER — MIDODRINE HCL 5 MG PO TABS: 5.0000 mg | ORAL_TABLET | Freq: Three times a day (TID) | ORAL | 2 refills | Status: DC

## 2020-05-25 NOTE — Patient Instructions (Addendum)
Stay hydrated. Very nice increasing your water intake. Let's try a new medicine for the lightheadedness.   If you ever wanted to try a medication or see a specialist, please let me know.  Let us know if you need anything.

## 2020-05-25 NOTE — Progress Notes (Signed)
Chief Complaint  Patient presents with  . Follow-up  Subjective Robert Pacheco is a 87 y.o. male who presents with urinary concerns Chronic.  Symptoms include nocturia and freq.  He was started on Flomax 0.4 mg/d last visit. He had decided not to take it due to side effects listed.  He denies hematuria, urinary infection and bladder stones. He reports no history of prostate cancer.  Still having lightheadedness. Labs neg. Increased water intake to around 40 oz/d. Not noticing a great deal of difference. Has not fallen or passed out.    Past Medical History:  Diagnosis Date  . Allergy   . Coronary artery disease   . Hyperlipidemia   . Hypertension   . Personal history of colonic adenomas 05/10/2012  . Skin cancer, basal cell      Exam BP 132/68 (BP Location: Left Arm, Patient Position: Sitting, Cuff Size: Normal)   Pulse 77   Temp 97.6 F (36.4 C) (Oral)   Ht 5\' 7"  (1.702 m)   Wt 174 lb 8 oz (79.2 kg)   SpO2 98%   BMI 27.33 kg/m  General:  well developed, well nourished, in no apparent distress Heart: RRR, no bruits, no LE edema Lungs:  clear to auscultation, breath sounds equal bilaterally; normal effort without accessory muscle use Abd: BS+, S, ND, NT Psych: well oriented with normal range of affect and appropriate judgement/insight  Assessment and Plan  BPH associated with nocturia  Lightheadedness - Plan: midodrine (PROAMATINE) 5 MG tablet  1. Offered Proscar, d/c'd Flomax. Declined both at this time. He will let me know if anything changes. 2. Chronic, uncontrolled. Stay hydrated. Trial midodrine 5 mg tid. F/u in 1 mo. Refer cards if no better.  The patient voiced understanding and agreement to the plan.  [place], DO 05/25/20 12:06 PM

## 2020-06-01 ENCOUNTER — Telehealth: Payer: Self-pay | Admitting: Family Medicine

## 2020-06-01 ENCOUNTER — Other Ambulatory Visit: Payer: Self-pay | Admitting: Family Medicine

## 2020-06-01 DIAGNOSIS — R42 Dizziness and giddiness: Secondary | ICD-10-CM

## 2020-06-01 NOTE — Telephone Encounter (Signed)
Referral done

## 2020-06-01 NOTE — Telephone Encounter (Signed)
Called informed the patient of instructions and he is ok with referral. Also verbalized understanding.

## 2020-06-01 NOTE — Telephone Encounter (Signed)
I would increase salt intake and refer him to cardiology if he is OK with that. Ty.

## 2020-06-01 NOTE — Telephone Encounter (Signed)
Caller: Manny  Call Back @ [phone]  midodrine (PROAMATINE) 5 MG tablet [[id number]]     Patient states medication prescribed last week doesn't agree with him, patient states medication brought blood pressure down to low. Patient states he has stop taking.  Please advise

## 2020-06-26 ENCOUNTER — Encounter: Payer: Self-pay | Admitting: Family Medicine

## 2020-06-26 ENCOUNTER — Ambulatory Visit: Payer: Medicare Other | Admitting: Family Medicine

## 2020-06-26 ENCOUNTER — Other Ambulatory Visit: Payer: Self-pay

## 2020-06-26 VITALS — BP 130/70 | HR 70 | Temp 98.0°F | Ht 67.0 in | Wt 177.2 lb

## 2020-06-26 DIAGNOSIS — R600 Localized edema: Secondary | ICD-10-CM

## 2020-06-26 DIAGNOSIS — R42 Dizziness and giddiness: Secondary | ICD-10-CM | POA: Diagnosis not present

## 2020-06-26 NOTE — Patient Instructions (Addendum)
Stay hydrated.  Keep your appt with Dr. Harriet Masson.   The new Shingrix vaccine (for shingles) is a 2 shot series. It can make people feel low energy, achy and almost like they have the flu for 48 hours after injection. Please plan accordingly when deciding on when to get this shot. Call the pharmacy for an appointment to get this. The second shot of the series is less severe regarding the side effects, but it still lasts 48 hours.   For the swelling in your lower extremities, be sure to elevate your legs when able, mind the salt intake, stay physically active and consider wearing compression stockings.  Let us know if you need anything.

## 2020-06-26 NOTE — Progress Notes (Signed)
Chief Complaint  Patient presents with  . Follow-up    Subjective: Patient is a 87 y.o. male here for follow up lightheadedness.  He did not do well with the midodrine as it upset his stomach. BP had been fine. He did not notice a good difference with this. Pt has an appt w Dr. Harriet Masson.   Patient continues to have issues with bilateral lower extremity edema.  He does have history of varicose veins but there is no pain over this area.  He will have intermittent pain behind his left leg.  His right leg has swelling but no discomfort associated with it.  He does wear compression stockings but states they are not as tight as they were when he first purchased them.  They are over-the-counter strength.  No shortness of breath or coughing.  No recent travel, injury, prolonged bedrest, or surgeries.  Past Medical History:  Diagnosis Date  . Allergy   . Coronary artery disease   . Hyperlipidemia   . Hypertension   . Personal history of colonic adenomas 05/10/2012  . Skin cancer, basal cell     Objective: BP 130/70 (BP Location: Left Arm, Patient Position: Sitting, Cuff Size: Normal)   Pulse 70   Temp 98 F (36.7 C) (Oral)   Ht 5\' 7"  (1.702 m)   Wt 177 lb 4 oz (80.4 kg)   SpO2 99%   BMI 27.76 kg/m  General: Awake, appears stated age HEENT: MMM, EOMi Heart: RRR, 3+ pitting edema on the left, 2+ pitting edema on the right tapering at the knees bilaterally MSK: Mild tenderness over the lateral left calf, no medial tenderness, negative Homans Lungs: CTAB, no rales, wheezes or rhonchi. No accessory muscle use Psych: Age appropriate judgment and insight, normal affect and mood  Assessment and Plan: Bilateral lower extremity edema  Lightheaded  1. Chronic, uncontrolled. Low risk for DVT, Wells score is 0. Will get rx compression stockings. Elevate legs, mind salt intake pending eval by Dr. Harriet Masson, counseled on activity. 2. Still happening, did not tolerate midodrine. Has appt w cards on 6/17.   F/u in 3 mo for CPE and likely q 6 mo after unless prn.  The patient voiced understanding and agreement to the plan.  Greater than 30 minutes were spent with the patient in addition to reviewing their chart information on the same day of the visit.   [place], DO 06/26/20  12:02 PM

## 2020-07-03 ENCOUNTER — Ambulatory Visit: Payer: Medicare Other | Admitting: Cardiology

## 2020-07-10 DIAGNOSIS — T7840XA Allergy, unspecified, initial encounter: Secondary | ICD-10-CM | POA: Insufficient documentation

## 2020-07-11 ENCOUNTER — Ambulatory Visit: Payer: Medicare Other | Admitting: Cardiology

## 2020-07-11 ENCOUNTER — Encounter: Payer: Self-pay | Admitting: Cardiology

## 2020-07-11 ENCOUNTER — Other Ambulatory Visit: Payer: Self-pay

## 2020-07-11 ENCOUNTER — Ambulatory Visit (INDEPENDENT_AMBULATORY_CARE_PROVIDER_SITE_OTHER): Payer: Medicare Other

## 2020-07-11 VITALS — BP 140/70 | HR 86 | Ht 67.0 in | Wt 178.0 lb

## 2020-07-11 DIAGNOSIS — R42 Dizziness and giddiness: Secondary | ICD-10-CM

## 2020-07-11 DIAGNOSIS — E782 Mixed hyperlipidemia: Secondary | ICD-10-CM | POA: Diagnosis not present

## 2020-07-11 DIAGNOSIS — R600 Localized edema: Secondary | ICD-10-CM | POA: Diagnosis not present

## 2020-07-11 DIAGNOSIS — E663 Overweight: Secondary | ICD-10-CM

## 2020-07-11 DIAGNOSIS — I2510 Atherosclerotic heart disease of native coronary artery without angina pectoris: Secondary | ICD-10-CM | POA: Diagnosis not present

## 2020-07-11 MED ORDER — FUROSEMIDE 20 MG PO TABS
ORAL_TABLET | ORAL | 3 refills | Status: DC
Start: 2020-07-11 — End: 2020-09-10

## 2020-07-11 MED ORDER — POTASSIUM CHLORIDE ER 10 MEQ PO TBCR
EXTENDED_RELEASE_TABLET | ORAL | 3 refills | Status: DC
Start: 2020-07-11 — End: 2020-09-10

## 2020-07-11 NOTE — Progress Notes (Signed)
Cardiology Office Note:    Date:  07/11/2020   ID:  Robert Pacheco, DOB Sep 29, 1933, MRN [id number]  PCP:  Shelda Pal, DO  Cardiologist:  None  Electrophysiologist:  None   Referring MD: Shelda Pal*   No chief complaint on file. I have been having some dizziness and my legs are swelling even more now  History of Present Illness:    Robert Pacheco is a 87 y.o. male with a hx of coronary artery disease status post three-vessel CABG in 2000, no clear stress test in March 2011 was normal, hypertension, hyperlipidemia.  The patient is here today to reestablish cardiac care.  He notes that he had not seen cardiology since his surgery.  He always follows with his primary care doctor. He tells me that he was advised to see cardiology as he has been experiencing significant intermittent dizziness along with leg swelling.  He states that it is not all the time but sometimes he feels dizzy and he has to sit down before doing any activities.  With this at times he feels that he he is experiencing some palpitations.  Nothing makes it better or worse.  He tells me he continues to have leg edema until he is noted with a history of varicose veins but he says recently is out of proportion for his varicose veins.  He denies any chest pain, or shortness of breath.  Past Medical History:  Diagnosis Date   Allergy    Coronary artery disease    Hyperlipidemia    Hypertension    Personal history of colonic adenomas 05/10/2012   Skin cancer, basal cell     Past Surgical History:  Procedure Laterality Date   CATARACT EXTRACTION     COLONOSCOPY  multiple   CORONARY ARTERY BYPASS GRAFT  2000   EYE SURGERY     HERNIA REPAIR     MOHS SURGERY      Current Medications: Current Meds  Medication Sig   aspirin 81 MG chewable tablet Chew 81 mg by mouth.   ketoconazole (NIZORAL) 2 % cream Apply topically 2 (two) times daily as needed for irritation.   NITROSTAT 0.4 MG SL tablet  DISSOLVE ONE TABLET UNDER THE TONGUE EVERY 5 MINUTES AS NEEDED.   simvastatin (ZOCOR) 20 MG tablet Take 1 tablet (20 mg total) by mouth at bedtime.     Allergies:   Other   Social History   Socioeconomic History   Marital status: Married    Spouse name: Not on file   Number of children: 4   Years of education: Not on file   Highest education level: Not on file  Occupational History   Occupation: retired    Comment: Education administrator  Tobacco Use   Smoking status: Former    Pack years: 0.00   Smokeless tobacco: Never  Vaping Use   Vaping Use: Never used  Substance and Sexual Activity   Alcohol use: No   Drug use: No   Sexual activity: Not on file  Other Topics Concern   Not on file  Social History Narrative   Not on file   Social Determinants of Health   Financial Resource Strain: Low Risk    Difficulty of Paying Living Expenses: Not hard at all  Food Insecurity: No Food Insecurity   Worried About Charity fundraiser in the Last Year: Never true   [place] in the Last Year: Never true  Transportation Needs: No  Transportation Needs   Lack of Transportation (Medical): No   Lack of Transportation (Non-Medical): No  Physical Activity: Inactive   Days of Exercise per Week: 0 days   Minutes of Exercise per Session: 0 min  Stress: No Stress Concern Present   Feeling of Stress : Not at all  Social Connections: Moderately Integrated   Frequency of Communication with Friends and Family: More than three times a week   Frequency of Social Gatherings with Friends and Family: Once a week   Attends Religious Services: More than 4 times per year   Active Member of [employer] or Organizations: No   Attends Music therapist: Never   Marital Status: Married     Family History: The patient's family history includes Colon cancer (age of onset: 44) in his brother; Colon cancer (age of onset: 68) in his sister; Healthy in his child and grandchild; Heart disease in his  father; Prostate cancer in his brother.  ROS:   Review of Systems  Constitution: Reports dizziness.  Negative for decreased appetite, fever and weight gain.  HENT: Negative for congestion, ear discharge, hoarse voice and sore throat.   Eyes: Negative for discharge, redness, vision loss in right eye and visual halos.  Cardiovascular: Report palpitations bilateral leg swelling.  Negative for chest pain, dyspnea on exertion. Respiratory: Negative for cough, hemoptysis, shortness of breath and snoring.   Endocrine: Negative for heat intolerance and polyphagia.  Hematologic/Lymphatic: Negative for bleeding problem. Does not bruise/bleed easily.  Skin: Negative for flushing, nail changes, rash and suspicious lesions.  Musculoskeletal: Negative for arthritis, joint pain, muscle cramps, myalgias, neck pain and stiffness.  Gastrointestinal: Negative for abdominal pain, bowel incontinence, diarrhea and excessive appetite.  Genitourinary: Negative for decreased libido, genital sores and incomplete emptying.  Neurological: Negative for brief paralysis, focal weakness, headaches and loss of balance.  Psychiatric/Behavioral: Negative for altered mental status, depression and suicidal ideas.  Allergic/Immunologic: Negative for HIV exposure and persistent infections.    EKGs/Labs/Other Studies Reviewed:    The following studies were reviewed today:   EKG:  The ekg ordered today demonstrates sinus rhythm, heart rate 86 bpm.   Recent Labs: 04/23/2020: ALT 11; BUN 14; Creatinine, Ser 0.97; Hemoglobin 15.2; Platelets 230.0; Potassium 4.9; Sodium 141  Recent Lipid Panel    Component Value Date/Time   CHOL 109 04/23/2020 0948   TRIG 62.0 04/23/2020 0948   HDL 43.20 04/23/2020 0948   CHOLHDL 3 04/23/2020 0948   VLDL 12.4 04/23/2020 0948   LDLCALC 53 04/23/2020 0948   LDLCALC 56 10/25/2019 0816   LDLDIRECT 135.0 09/11/2014 1024    Physical Exam:    VS:  BP 140/70 (BP Location: Right Arm)   Pulse  86   Ht 5\' 7"  (1.702 m)   Wt 178 lb (80.7 kg)   SpO2 98%   BMI 27.88 kg/m     Wt Readings from Last 3 Encounters:  07/11/20 178 lb (80.7 kg)  06/26/20 177 lb 4 oz (80.4 kg)  05/25/20 174 lb 8 oz (79.2 kg)     GEN: Well nourished, well developed in no acute distress HEENT: Normal NECK: No JVD; No carotid bruits LYMPHATICS: No lymphadenopathy CARDIAC: S1S2 noted,RRR, no murmurs, rubs, gallops RESPIRATORY:  Clear to auscultation without rales, wheezing or rhonchi  ABDOMEN: Soft, non-tender, non-distended, +bowel sounds, no guarding. EXTREMITIES: No edema, No cyanosis, no clubbing MUSCULOSKELETAL:  No deformity  SKIN: Warm and dry NEUROLOGIC:  Alert and oriented x 3, non-focal PSYCHIATRIC:  Normal affect, good  insight  ASSESSMENT:    1. Bilateral leg edema   2. Dizziness   3. Coronary artery disease involving native coronary artery of native heart without angina pectoris   4. Mixed hyperlipidemia   5. Overweight (BMI 25.0-29.9)    PLAN:     He is experiencing significant leg edema given his history of coronary artery disease and prior ischemic cardiomyopathy I like to get an echocardiogram in this patient to reassess his LV function. In addition, for his palpitation dizziness ZIO monitor will be placed on the patient for 14 days. His bilateral leg edema +3 with right more than left likely is out of proportion he tells me we will start with gentle diuretic use Lasix 20 mg Tuesday Thursdays and Saturdays with potassium supplement. We will also get blood work today BMP, mag and BNP. No anginal symptoms continue patient on his aspirin as well as simvastatin reviewed his lipid profile done by his PCP on April 23, 2020 HDL 43, LDL 53, total cholesterol 109, triglycerides 62.  Hyperlipidemia - continue with current statin medication. The patient is in agreement with the above plan. The patient left the office in stable condition.  The patient will follow up in 8  weeks.   Medication Adjustments/Labs and Tests Ordered: Current medicines are reviewed at length with the patient today.  Concerns regarding medicines are outlined above.  No orders of the defined types were placed in this encounter.  No orders of the defined types were placed in this encounter.   Patient Instructions  Medication Instructions:  Your physician has recommended you make the following change in your medication:  START: Lasix 20 mg on Tuesday, Thursday and Saturday START: Potassium 140 meq Tuesday, Thursday and Saturday  *If you need a refill on your cardiac medications before your next appointment, please call your pharmacy*   Lab Work: Your physician recommends that you return for lab work in: TODAY: BMET, Mag, BNP If you have labs (blood work) drawn today and your tests are completely normal, you will receive your results only by: [place] (if you have MyChart) OR A paper copy in the mail If you have any lab test that is abnormal or we need to change your treatment, we will call you to review the results.   Testing/Procedures: Your physician has requested that you have an echocardiogram. Echocardiography is a painless test that uses sound waves to create images of your heart. It provides your doctor with information about the size and shape of your heart and how well your heart's chambers and valves are working. This procedure takes approximately one hour. There are no restrictions for this procedure.  A zio monitor was ordered today. It will remain on for 14 days. You will then return monitor and event diary in provided box. It takes 1-2 weeks for report to be downloaded and returned to [place]. We will call you with the results. If monitor falls off or has orange flashing light, please call Zio for further instructions.     Follow-Up: At [facility], you and your health needs are our priority.  As part of our continuing mission to provide you with exceptional  heart care, we have created designated Provider Care Teams.  These Care Teams include your primary Cardiologist (physician) and Advanced Practice Providers (APPs -  Physician Assistants and Nurse Practitioners) who all work together to provide you with the care you need, when you need it.  We recommend signing up for the patient portal called "  MyChart".  Sign up information is provided on this After Visit Summary.  MyChart is used to connect with patients for Virtual Visits (Telemedicine).  Patients are able to view lab/test results, encounter notes, upcoming appointments, etc.  Non-urgent messages can be sent to your provider as well.   To learn more about what you can do with MyChart, go to [email or web address].    Your next appointment:   8 week(s)  The format for your next appointment:   In Person  Provider:   Berniece Salines, DO   Other Instructions  ZIO  WHY IS MY DOCTOR PRESCRIBING ZIO? The Zio system is proven and trusted by physicians to detect and diagnose irregular heart rhythms -- and has been prescribed to hundreds of thousands of patients.  The FDA has cleared the Zio system to monitor for many different kinds of irregular heart rhythms. In a study, physicians were able to reach a diagnosis 90% of the time with the Zio system1.  You can wear the Zio monitor -- a small, discreet, comfortable patch -- during your normal day-to-day activity, including while you sleep, shower, and exercise, while it records every single heartbeat for analysis.  1Barrett, P., et al. Comparison of 24 Hour Holter Monitoring Versus 14 Day Novel Adhesive Patch Electrocardiographic Monitoring. [employer], 2014.  ZIO VS. HOLTER MONITORING The Zio monitor can be comfortably worn for up to 14 days. Holter monitors can be worn for 24 to 48 hours, limiting the time to record any irregular heart rhythms you may have. Zio is able to capture data for the 51% of patients who have their first  symptom-triggered arrhythmia after 48 hours.1  LIVE WITHOUT RESTRICTIONS The Zio ambulatory cardiac monitor is a small, unobtrusive, and water-resistant patch--you might even forget you're wearing it. The Zio monitor records and stores every beat of your heart, whether you're sleeping, working out, or showering. Remove on: June 29th 2022  Echocardiogram An echocardiogram is a test that uses sound waves (ultrasound) to produce images of the heart. Images from an echocardiogram can provide important information about: Heart size and shape. The size and thickness and movement of your heart's walls. Heart muscle function and strength. Heart valve function or if you have stenosis. Stenosis is when the heart valves are too narrow. If blood is flowing backward through the heart valves (regurgitation). A tumor or infectious growth around the heart valves. Areas of heart muscle that are not working well because of poor blood flow or injury from a heart attack. Aneurysm detection. An aneurysm is a weak or damaged part of an artery wall. The wall bulges out from the normal force of blood pumping through the body. Tell a health care provider about: Any allergies you have. All medicines you are taking, including vitamins, herbs, eye drops, creams, and over-the-counter medicines. Any blood disorders you have. Any surgeries you have had. Any medical conditions you have. Whether you are pregnant or may be pregnant. What are the risks? Generally, this is a safe test. However, problems may occur, including an allergic reaction to dye (contrast) that may be used during the test. What happens before the test? No specific preparation is needed. You may eat and drink normally. What happens during the test?  You will take off your clothes from the waist up and put on a hospital gown. Electrodes or electrocardiogram (ECG)patches may be placed on your chest. The electrodes or patches are then connected to a  device that monitors your heart rate and  rhythm. You will lie down on a table for an ultrasound exam. A gel will be applied to your chest to help sound waves pass through your skin. A handheld device, called a transducer, will be pressed against your chest and moved over your heart. The transducer produces sound waves that travel to your heart and bounce back (or "echo" back) to the transducer. These sound waves will be captured in real-time and changed into images of your heart that can be viewed on a video monitor. The images will be recorded on a computer and reviewed by your health care provider. You may be asked to change positions or hold your breath for a short time. This makes it easier to get different views or better views of your heart. In some cases, you may receive contrast through an IV in one of your veins. This can improve the quality of the pictures from your heart. The procedure may vary among health care providers and hospitals. What can I expect after the test? You may return to your normal, everyday life, including diet, activities, andmedicines, unless your health care provider tells you not to do that. Follow these instructions at home: It is up to you to get the results of your test. Ask your health care provider, or the department that is doing the test, when your results will be ready. Keep all follow-up visits. This is important. Summary An echocardiogram is a test that uses sound waves (ultrasound) to produce images of the heart. Images from an echocardiogram can provide important information about the size and shape of your heart, heart muscle function, heart valve function, and other possible heart problems. You do not need to do anything to prepare before this test. You may eat and drink normally. After the echocardiogram is completed, you may return to your normal, everyday life, unless your health care provider tells you not to do that. This information is not  intended to replace advice given to you by your health care provider. Make sure you discuss any questions you have with your healthcare provider. Document Revised: 09/06/2019 Document Reviewed: 09/06/2019 Elsevier Patient Education  2022 [place].   Adopting a Healthy Lifestyle.  Know what a healthy weight is for you (roughly BMI <25) and aim to maintain this   Aim for 7+ servings of fruits and vegetables daily   65-80+ fluid ounces of water or unsweet tea for healthy kidneys   Limit to max 1 drink of alcohol per day; avoid smoking/tobacco   Limit animal fats in diet for cholesterol and heart health - choose grass fed whenever available   Avoid highly processed foods, and foods high in saturated/trans fats   Aim for low stress - take time to unwind and care for your mental health   Aim for 150 min of moderate intensity exercise weekly for heart health, and weights twice weekly for bone health   Aim for 7-9 hours of sleep daily   When it comes to diets, agreement about the perfect plan isnt easy to find, even among the experts. Experts at the [employer] developed an idea known as the Healthy Eating Plate. Just imagine a plate divided into logical, healthy portions.   The emphasis is on diet quality:   Load up on vegetables and fruits - one-half of your plate: Aim for color and variety, and remember that potatoes dont count.   Go for whole grains - one-quarter of your plate: Whole wheat, barley, wheat berries, quinoa,  oats, brown rice, and foods made with them. If you want pasta, go with whole wheat pasta.   Protein power - one-quarter of your plate: Fish, chicken, beans, and nuts are all healthy, versatile protein sources. Limit red meat.   The diet, however, does go beyond the plate, offering a few other suggestions.   Use healthy plant oils, such as olive, canola, soy, corn, sunflower and peanut. Check the labels, and avoid partially hydrogenated oil,  which have unhealthy trans fats.   If youre thirsty, drink water. Coffee and tea are good in moderation, but skip sugary drinks and limit milk and dairy products to one or two daily servings.   The type of carbohydrate in the diet is more important than the amount. Some sources of carbohydrates, such as vegetables, fruits, whole grains, and beans-are healthier than others.   Finally, stay active  Signed, Berniece Salines, DO  07/11/2020 2:18 PM    [facility]

## 2020-07-11 NOTE — Patient Instructions (Signed)
Medication Instructions:  Your physician has recommended you make the following change in your medication:  START: Lasix 20 mg on Tuesday, Thursday and Saturday START: Potassium 140 meq Tuesday, Thursday and Saturday  *If you need a refill on your cardiac medications before your next appointment, please call your pharmacy*   Lab Work: Your physician recommends that you return for lab work in: TODAY: BMET, Mag, BNP If you have labs (blood work) drawn today and your tests are completely normal, you will receive your results only by: [place] (if you have MyChart) OR A paper copy in the mail If you have any lab test that is abnormal or we need to change your treatment, we will call you to review the results.   Testing/Procedures: Your physician has requested that you have an echocardiogram. Echocardiography is a painless test that uses sound waves to create images of your heart. It provides your doctor with information about the size and shape of your heart and how well your heart's chambers and valves are working. This procedure takes approximately one hour. There are no restrictions for this procedure.  A zio monitor was ordered today. It will remain on for 14 days. You will then return monitor and event diary in provided box. It takes 1-2 weeks for report to be downloaded and returned to [place]. We will call you with the results. If monitor falls off or has orange flashing light, please call Zio for further instructions.     Follow-Up: At [facility], you and your health needs are our priority.  As part of our continuing mission to provide you with exceptional heart care, we have created designated Provider Care Teams.  These Care Teams include your primary Cardiologist (physician) and Advanced Practice Providers (APPs -  Physician Assistants and Nurse Practitioners) who all work together to provide you with the care you need, when you need it.  We recommend signing up for the patient  portal called "MyChart".  Sign up information is provided on this After Visit Summary.  MyChart is used to connect with patients for Virtual Visits (Telemedicine).  Patients are able to view lab/test results, encounter notes, upcoming appointments, etc.  Non-urgent messages can be sent to your provider as well.   To learn more about what you can do with MyChart, go to [email or web address].    Your next appointment:   8 week(s)  The format for your next appointment:   In Person  Provider:   Berniece Salines, DO   Other Instructions  ZIO  WHY IS MY DOCTOR PRESCRIBING ZIO? The Zio system is proven and trusted by physicians to detect and diagnose irregular heart rhythms -- and has been prescribed to hundreds of thousands of patients.  The FDA has cleared the Zio system to monitor for many different kinds of irregular heart rhythms. In a study, physicians were able to reach a diagnosis 90% of the time with the Zio system1.  You can wear the Zio monitor -- a small, discreet, comfortable patch -- during your normal day-to-day activity, including while you sleep, shower, and exercise, while it records every single heartbeat for analysis.  1Barrett, P., et al. Comparison of 24 Hour Holter Monitoring Versus 14 Day Novel Adhesive Patch Electrocardiographic Monitoring. [place], 2014.  ZIO VS. HOLTER MONITORING The Zio monitor can be comfortably worn for up to 14 days. Holter monitors can be worn for 24 to 48 hours, limiting the time to record any irregular heart rhythms you may have.  Zio is able to capture data for the 51% of patients who have their first symptom-triggered arrhythmia after 48 hours.1  LIVE WITHOUT RESTRICTIONS The Zio ambulatory cardiac monitor is a small, unobtrusive, and water-resistant patch--you might even forget you're wearing it. The Zio monitor records and stores every beat of your heart, whether you're sleeping, working out, or showering. Remove on: June  29th 2022  Echocardiogram An echocardiogram is a test that uses sound waves (ultrasound) to produce images of the heart. Images from an echocardiogram can provide important information about: Heart size and shape. The size and thickness and movement of your heart's walls. Heart muscle function and strength. Heart valve function or if you have stenosis. Stenosis is when the heart valves are too narrow. If blood is flowing backward through the heart valves (regurgitation). A tumor or infectious growth around the heart valves. Areas of heart muscle that are not working well because of poor blood flow or injury from a heart attack. Aneurysm detection. An aneurysm is a weak or damaged part of an artery wall. The wall bulges out from the normal force of blood pumping through the body. Tell a health care provider about: Any allergies you have. All medicines you are taking, including vitamins, herbs, eye drops, creams, and over-the-counter medicines. Any blood disorders you have. Any surgeries you have had. Any medical conditions you have. Whether you are pregnant or may be pregnant. What are the risks? Generally, this is a safe test. However, problems may occur, including an allergic reaction to dye (contrast) that may be used during the test. What happens before the test? No specific preparation is needed. You may eat and drink normally. What happens during the test?  You will take off your clothes from the waist up and put on a hospital gown. Electrodes or electrocardiogram (ECG)patches may be placed on your chest. The electrodes or patches are then connected to a device that monitors your heart rate and rhythm. You will lie down on a table for an ultrasound exam. A gel will be applied to your chest to help sound waves pass through your skin. A handheld device, called a transducer, will be pressed against your chest and moved over your heart. The transducer produces sound waves that travel to  your heart and bounce back (or "echo" back) to the transducer. These sound waves will be captured in real-time and changed into images of your heart that can be viewed on a video monitor. The images will be recorded on a computer and reviewed by your health care provider. You may be asked to change positions or hold your breath for a short time. This makes it easier to get different views or better views of your heart. In some cases, you may receive contrast through an IV in one of your veins. This can improve the quality of the pictures from your heart. The procedure may vary among health care providers and hospitals. What can I expect after the test? You may return to your normal, everyday life, including diet, activities, andmedicines, unless your health care provider tells you not to do that. Follow these instructions at home: It is up to you to get the results of your test. Ask your health care provider, or the department that is doing the test, when your results will be ready. Keep all follow-up visits. This is important. Summary An echocardiogram is a test that uses sound waves (ultrasound) to produce images of the heart. Images from an echocardiogram can provide important  information about the size and shape of your heart, heart muscle function, heart valve function, and other possible heart problems. You do not need to do anything to prepare before this test. You may eat and drink normally. After the echocardiogram is completed, you may return to your normal, everyday life, unless your health care provider tells you not to do that. This information is not intended to replace advice given to you by your health care provider. Make sure you discuss any questions you have with your healthcare provider. Document Revised: 09/06/2019 Document Reviewed: 09/06/2019 Elsevier Patient Education  2022 [employer].

## 2020-07-12 ENCOUNTER — Telehealth: Payer: Self-pay | Admitting: Cardiology

## 2020-07-12 ENCOUNTER — Telehealth: Payer: Self-pay

## 2020-07-12 LAB — BASIC METABOLIC PANEL
BUN/Creatinine Ratio: 19 (ref 10–24)
BUN: 18 mg/dL (ref 8–27)
CO2: 22 mmol/L (ref 20–29)
Calcium: 9.4 mg/dL (ref 8.6–10.2)
Chloride: 103 mmol/L (ref 96–106)
Creatinine, Ser: 0.95 mg/dL (ref 0.76–1.27)
Glucose: 106 mg/dL — ABNORMAL HIGH (ref 65–99)
Potassium: 4.6 mmol/L (ref 3.5–5.2)
Sodium: 143 mmol/L (ref 134–144)
eGFR: 78 mL/min/{1.73_m2} (ref >59)

## 2020-07-12 LAB — MAGNESIUM: Magnesium: 2.4 mg/dL — ABNORMAL HIGH (ref 1.6–2.3)

## 2020-07-12 LAB — PRO B NATRIURETIC PEPTIDE: NT-Pro BNP: 136 pg/mL (ref 0–486)

## 2020-07-12 NOTE — Telephone Encounter (Signed)
-----   Message from Berniece Salines, DO sent at 07/12/2020  3:31 PM EDT ----- Labs stable

## 2020-07-12 NOTE — Telephone Encounter (Signed)
Recommendations reviewed with pt as per Dr. Tobb's note.  Pt verbalized understanding and had no additional questions.  

## 2020-07-12 NOTE — Telephone Encounter (Signed)
Spoke with patient regarding results and recommendation.  Patient verbalizes understanding and is agreeable to plan of care. Advised patient to call back with any issues or concerns.  

## 2020-07-12 NOTE — Telephone Encounter (Signed)
  Pt c/o medication issue:  1. Name of Medication: Potassium and Lasix  2. How are you currently taking this medication (dosage and times per day)? As directed  3. Are you having a reaction (difficulty breathing--STAT)?  NA  4. What is your medication issue? Patient would like to know if he should take these two medications together or at different times.

## 2020-07-12 NOTE — Telephone Encounter (Signed)
Tried calling patient. No answer and no voicemail set up for me to leave a message. 

## 2020-07-12 NOTE — Telephone Encounter (Signed)
I called the patient on [phone] directly to visit to multiple times.  He has please let the patient know that he can take the Lasix as well as the potassium to get up.

## 2020-07-13 ENCOUNTER — Telehealth: Payer: Self-pay | Admitting: Cardiology

## 2020-07-13 NOTE — Telephone Encounter (Signed)
Spoke to the patient just now and he let me know that yesterday he was dizzy for about 15 minutes the second he took the medication. He has not had anymore dizziness today or again yesterday. He states that he does not have any symptoms anymore.   I advised him to keep taking these medications and to call us again if he has any other symptoms.

## 2020-07-13 NOTE — Telephone Encounter (Signed)
Pt c/o medication issue:  1. Name of Medication:  furosemide (LASIX) 20 MG tablet  potassium chloride (KLOR-CON) 10 MEQ tablet  2. How are you currently taking this medication (dosage and times per day)? As prescribed  3. Are you having a reaction (difficulty breathing--STAT)? Yes   4. What is your medication issue?   Patient states yesterday he became extremely dizzy for about 15 minutes and he assumes one of these medications caused it.  STAT if patient feels like he/she is going to faint   Are you dizzy now? No, but was dizzy yesterday for about 15 minutes   Do you feel faint or have you passed out? No   Do you have any other symptoms? No   Have you checked your HR and BP (record if available)?  No log available. Patient states he didn't check his BP today or yesterday, but it is usually within normal range when he does check it. He states it's usually around 120/60.

## 2020-07-24 DIAGNOSIS — R42 Dizziness and giddiness: Secondary | ICD-10-CM | POA: Diagnosis not present

## 2020-07-31 ENCOUNTER — Other Ambulatory Visit: Payer: Self-pay

## 2020-07-31 ENCOUNTER — Ambulatory Visit (HOSPITAL_COMMUNITY): Payer: Medicare Other | Attending: Cardiology

## 2020-07-31 ENCOUNTER — Telehealth: Payer: Self-pay | Admitting: Cardiology

## 2020-07-31 DIAGNOSIS — R600 Localized edema: Secondary | ICD-10-CM

## 2020-07-31 LAB — ECHOCARDIOGRAM COMPLETE
Area-P 1/2: 3.19 cm2
S' Lateral: 2.8 cm

## 2020-07-31 NOTE — Telephone Encounter (Signed)
Patient is returning call to review monitor results. 

## 2020-08-01 ENCOUNTER — Other Ambulatory Visit: Payer: Self-pay

## 2020-08-01 DIAGNOSIS — K7689 Other specified diseases of liver: Secondary | ICD-10-CM

## 2020-08-01 MED ORDER — METOPROLOL SUCCINATE ER 25 MG PO TB24
12.5000 mg | ORAL_TABLET | Freq: Every day | ORAL | 3 refills | Status: DC
Start: 2020-08-01 — End: 2020-09-10

## 2020-08-01 NOTE — Telephone Encounter (Signed)
Spoke with patient, see chart.    

## 2020-08-01 NOTE — Progress Notes (Signed)
l °

## 2020-08-03 ENCOUNTER — Other Ambulatory Visit: Payer: Self-pay

## 2020-08-03 ENCOUNTER — Ambulatory Visit (HOSPITAL_BASED_OUTPATIENT_CLINIC_OR_DEPARTMENT_OTHER)
Admission: RE | Admit: 2020-08-03 | Discharge: 2020-08-03 | Disposition: A | Discharge status: Home / Self Care | Payer: Medicare Other | Source: Ambulatory Visit | Attending: Cardiology | Admitting: Cardiology

## 2020-08-03 DIAGNOSIS — K7689 Other specified diseases of liver: Secondary | ICD-10-CM | POA: Insufficient documentation

## 2020-08-06 ENCOUNTER — Other Ambulatory Visit: Payer: Self-pay

## 2020-08-06 DIAGNOSIS — K7689 Other specified diseases of liver: Secondary | ICD-10-CM

## 2020-08-06 NOTE — Progress Notes (Signed)
Referral made 

## 2020-08-09 ENCOUNTER — Telehealth: Payer: Self-pay | Admitting: Cardiology

## 2020-08-09 NOTE — Telephone Encounter (Signed)
Pt c/o BP issue: STAT if pt c/o blurred vision, one-sided weakness or slurred speech  1. What are your last 5 BP readings?  No log available  2. Are you having any other symptoms (ex. Dizziness, headache, blurred vision, passed out)?  No   3. What is your BP issue?  Patient states he has been taking Metoprolol for about 1 week and yesterday his BP was extremely low at 103/50.  He states this morning when he woke up it was 136/60-70. He took it again and the systolic dropped to about 114. He states he is concerned that it will drop too low, so he hasn't taken his Metoprolol.

## 2020-08-09 NOTE — Telephone Encounter (Signed)
He can stop the metoprolol for now.

## 2020-08-10 NOTE — Telephone Encounter (Signed)
Called patient this morning and he states he took his blood pressure this morning and it was kind of high. He states it was 149 over something then 140 over something. I just don't know what to do. Patient made aware Dr. Harriet Masson is okay with him stopping the Metoprolol for now, patient states well it was high this morning so I'll just take it. Patient encouraged to monitor his blood pressure over the weekend and let us know what is has ran all weekend on Sunday night or Monday morning. He verbalized understanding.

## 2020-08-13 NOTE — Telephone Encounter (Signed)
Spoke to patient just now and he let me know that his blood pressures have been good over the last two days and he is going to continue taking his metoprolol. I advised him to monitor his blood pressure and to let us know if he develops any symptoms or if it is continuously low.    Encouraged patient to call back with any questions or concerns.

## 2020-08-13 NOTE — Telephone Encounter (Signed)
Patient states he stopped the metoprolol for 1 night and then started it again. He states ever since his BP seems to be alright. He states today his BP around 7:30 am was 126/62/60, and 10:55 am today 118/66/64. He states since starting it back again his BP has been ranging around this readings.

## 2020-08-20 ENCOUNTER — Encounter: Payer: Self-pay | Admitting: Physician Assistant

## 2020-09-10 ENCOUNTER — Other Ambulatory Visit: Payer: Self-pay

## 2020-09-10 ENCOUNTER — Encounter: Payer: Self-pay | Admitting: Cardiology

## 2020-09-10 ENCOUNTER — Ambulatory Visit: Payer: Medicare Other | Admitting: Cardiology

## 2020-09-10 VITALS — BP 140/72 | HR 62 | Ht 67.0 in | Wt 175.1 lb

## 2020-09-10 DIAGNOSIS — I471 Supraventricular tachycardia: Secondary | ICD-10-CM | POA: Diagnosis not present

## 2020-09-10 DIAGNOSIS — E785 Hyperlipidemia, unspecified: Secondary | ICD-10-CM

## 2020-09-10 DIAGNOSIS — I491 Atrial premature depolarization: Secondary | ICD-10-CM

## 2020-09-10 DIAGNOSIS — I25701 Atherosclerosis of coronary artery bypass graft(s), unspecified, with angina pectoris with documented spasm: Secondary | ICD-10-CM

## 2020-09-10 MED ORDER — ASPIRIN 81 MG PO CHEW: 81.0000 mg | CHEWABLE_TABLET | Freq: Every day | ORAL | 3 refills | Status: DC

## 2020-09-10 MED ORDER — POTASSIUM CHLORIDE ER 10 MEQ PO TBCR: EXTENDED_RELEASE_TABLET | ORAL | 3 refills | Status: DC

## 2020-09-10 MED ORDER — NITROSTAT 0.4 MG SL SUBL: SUBLINGUAL_TABLET | SUBLINGUAL | 1 refills | Status: DC

## 2020-09-10 MED ORDER — METOPROLOL SUCCINATE ER 25 MG PO TB24: 12.5000 mg | ORAL_TABLET | Freq: Every day | ORAL | 3 refills | Status: DC

## 2020-09-10 MED ORDER — SIMVASTATIN 20 MG PO TABS: 20.0000 mg | ORAL_TABLET | Freq: Every day | ORAL | 3 refills | Status: DC

## 2020-09-10 MED ORDER — FUROSEMIDE 20 MG PO TABS: ORAL_TABLET | ORAL | 3 refills | Status: DC

## 2020-09-10 NOTE — Patient Instructions (Signed)
Medication Instructions:  Your physician recommends that you continue on your current medications as directed. Please refer to the Current Medication list given to you today.  *If you need a refill on your cardiac medications before your next appointment, please call your pharmacy*   Lab Work: None If you have labs (blood work) drawn today and your tests are completely normal, you will receive your results only by: [place] (if you have MyChart) OR A paper copy in the mail If you have any lab test that is abnormal or we need to change your treatment, we will call you to review the results.   Testing/Procedures: None   Follow-Up: At [facility], you and your health needs are our priority.  As part of our continuing mission to provide you with exceptional heart care, we have created designated Provider Care Teams.  These Care Teams include your primary Cardiologist (physician) and Advanced Practice Providers (APPs -  Physician Assistants and Nurse Practitioners) who all work together to provide you with the care you need, when you need it.  We recommend signing up for the patient portal called "MyChart".  Sign up information is provided on this After Visit Summary.  MyChart is used to connect with patients for Virtual Visits (Telemedicine).  Patients are able to view lab/test results, encounter notes, upcoming appointments, etc.  Non-urgent messages can be sent to your provider as well.   To learn more about what you can do with MyChart, go to [email or web address].    Your next appointment:   1 year(s)  The format for your next appointment:   In Person  Provider:   [place], DO [address], [place], [place] [address]    Other Instructions

## 2020-09-10 NOTE — Progress Notes (Signed)
Cardiology Office Note:    Date:  09/10/2020   ID:  Robert Pacheco, DOB Mar 28, 1933, MRN [id number]  PCP:  Shelda Pal, DO  Cardiologist:  Berniece Salines, DO  Electrophysiologist:  None   Referring MD: Shelda Pal*   I am doing a lot better  History of Present Illness:    Robert Pacheco is a 87 y.o. male with a hx of coronary artery disease status post CABG in 2000, hypertension, hyperlipidemia, premature atrial complex recently seen on a ZIO monitor.  I initially saw the patient on July 11, 2020 at that time he reported he had been experiencing some dizziness.  During that visit I placed a monitor on the patient.  He also did have bilateral leg edema I did cautiously give the patient some diuretics.  In the interim patient was able to wear his monitor.  He also started on the Lasix.  He had some episodes where he had intermittent low blood pressure but this has resolved.  Past Medical History:  Diagnosis Date   Allergy    Coronary artery disease    Hyperlipidemia    Hypertension    Personal history of colonic adenomas 05/10/2012   Skin cancer, basal cell     Past Surgical History:  Procedure Laterality Date   CATARACT EXTRACTION     COLONOSCOPY  multiple   CORONARY ARTERY BYPASS GRAFT  2000   EYE SURGERY     HERNIA REPAIR     MOHS SURGERY      Current Medications: Current Meds  Medication Sig   ketoconazole (NIZORAL) 2 % cream Apply topically 2 (two) times daily as needed for irritation.   [DISCONTINUED] aspirin 81 MG chewable tablet Chew 81 mg by mouth.   [DISCONTINUED] furosemide (LASIX) 20 MG tablet Take 20 mg (one tablet) on Tuesday, Thursday and Saturday   [DISCONTINUED] metoprolol succinate (TOPROL XL) 25 MG 24 hr tablet Take 0.5 tablets (12.5 mg total) by mouth daily.   [DISCONTINUED] NITROSTAT 0.4 MG SL tablet DISSOLVE ONE TABLET UNDER THE TONGUE EVERY 5 MINUTES AS NEEDED.   [DISCONTINUED] potassium chloride (KLOR-CON) 10 MEQ tablet Take 10 meq  (one tablet) on Tuesday, Thursday and Saturday   [DISCONTINUED] simvastatin (ZOCOR) 20 MG tablet Take 1 tablet (20 mg total) by mouth at bedtime.     Allergies:   Other   Social History   Socioeconomic History   Marital status: Widowed    Spouse name: Not on file   Number of children: 4   Years of education: Not on file   Highest education level: Not on file  Occupational History   Occupation: retired    Comment: Education administrator  Tobacco Use   Smoking status: Former   Smokeless tobacco: Never  Scientific laboratory technician Use: Never used  Substance and Sexual Activity   Alcohol use: No   Drug use: No   Sexual activity: Not on file  Other Topics Concern   Not on file  Social History Narrative   Not on file   Social Determinants of Health   Financial Resource Strain: Low Risk    Difficulty of Paying Living Expenses: Not hard at all  Food Insecurity: No Food Insecurity   Worried About Charity fundraiser in the Last Year: Never true   Arboriculturist in the Last Year: Never true  Transportation Needs: No Transportation Needs   Lack of Transportation (Medical): No   Lack of Transportation (Non-Medical): No  Physical Activity: Inactive   Days of Exercise per Week: 0 days   Minutes of Exercise per Session: 0 min  Stress: No Stress Concern Present   Feeling of Stress : Not at all  Social Connections: Moderately Integrated   Frequency of Communication with Friends and Family: More than three times a week   Frequency of Social Gatherings with Friends and Family: Once a week   Attends Religious Services: More than 4 times per year   Active Member of [employer] or Organizations: No   Attends Music therapist: Never   Marital Status: Married     Family History: The patient's family history includes Colon cancer (age of onset: 47) in his brother; Colon cancer (age of onset: 41) in his sister; Healthy in his child and grandchild; Heart disease in his father; Prostate  cancer in his brother.  ROS:   Review of Systems  Constitution: Negative for decreased appetite, fever and weight gain.  HENT: Negative for congestion, ear discharge, hoarse voice and sore throat.   Eyes: Negative for discharge, redness, vision loss in right eye and visual halos.  Cardiovascular: Negative for chest pain, dyspnea on exertion, leg swelling, orthopnea and palpitations.  Respiratory: Negative for cough, hemoptysis, shortness of breath and snoring.   Endocrine: Negative for heat intolerance and polyphagia.  Hematologic/Lymphatic: Negative for bleeding problem. Does not bruise/bleed easily.  Skin: Negative for flushing, nail changes, rash and suspicious lesions.  Musculoskeletal: Negative for arthritis, joint pain, muscle cramps, myalgias, neck pain and stiffness.  Gastrointestinal: Negative for abdominal pain, bowel incontinence, diarrhea and excessive appetite.  Genitourinary: Negative for decreased libido, genital sores and incomplete emptying.  Neurological: Negative for brief paralysis, focal weakness, headaches and loss of balance.  Psychiatric/Behavioral: Negative for altered mental status, depression and suicidal ideas.  Allergic/Immunologic: Negative for HIV exposure and persistent infections.    EKGs/Labs/Other Studies Reviewed:    The following studies were reviewed today:   EKG:  The ekg ordered today demonstrates sinus rhythm, heart rate 62 bpm.    Transthoracic echocardiogram July 31, 2020 IMPRESSIONS     1. Left ventricular ejection fraction, by estimation, is 60 to 65%. The  left ventricle has normal function. The left ventricle has no regional  wall motion abnormalities. Left ventricular diastolic parameters were  normal.   2. Right ventricular systolic function is normal. The right ventricular  size is normal. There is normal pulmonary artery systolic pressure. The  estimated right ventricular systolic pressure is 99991111 mmHg.   3. The mitral valve is  normal in structure. No evidence of mitral valve  regurgitation. No evidence of mitral stenosis.   4. The aortic valve has an indeterminant number of cusps. Aortic valve  regurgitation is not visualized. Mild to moderate aortic valve  sclerosis/calcification is present, without any evidence of aortic  stenosis.   5. The inferior vena cava is normal in size with <50% respiratory  variability, suggesting right atrial pressure of 8 mmHg.LLarge cystic  structure in the liver of unclear etiology. REcommend abdominal US.   FINDINGS   Left Ventricle: Left ventricular ejection fraction, by estimation, is 60  to 65%. The left ventricle has normal function. The left ventricle has no  regional wall motion abnormalities. The left ventricular internal cavity  size was normal in size. There is   no left ventricular hypertrophy. Left ventricular diastolic parameters  were normal. Normal left ventricular filling pressure.   Right Ventricle: The right ventricular size is normal. No increase in  right ventricular wall thickness. Right ventricular systolic function is  normal. There is normal pulmonary artery systolic pressure. The tricuspid  regurgitant velocity is 2.61 m/s, and   with an assumed right atrial pressure of 8 mmHg, the estimated right  ventricular systolic pressure is 99991111 mmHg.   Left Atrium: Left atrial size was normal in size.   Right Atrium: Right atrial size was normal in size.   Pericardium: There is no evidence of pericardial effusion.   Mitral Valve: The mitral valve is normal in structure. No evidence of  mitral valve regurgitation. No evidence of mitral valve stenosis.   Tricuspid Valve: The tricuspid valve is normal in structure. Tricuspid  valve regurgitation is trivial. No evidence of tricuspid stenosis.   Aortic Valve: The aortic valve has an indeterminant number of cusps.  Aortic valve regurgitation is not visualized. Mild to moderate aortic  valve  sclerosis/calcification is present, without any evidence of aortic  stenosis.   Pulmonic Valve: The pulmonic valve was normal in structure. Pulmonic valve  regurgitation is not visualized. No evidence of pulmonic stenosis.   Aorta: The aortic root is normal in size and structure.   Venous: The inferior vena cava is normal in size with less than 50%  respiratory variability, suggesting right atrial pressure of 8 mmHg.   IAS/Shunts: No atrial level shunt detected by color flow Doppler.       ZIO monitor Patch Wear Time:  13 days and 3 hours starting July 11, 2020   Patient had a min HR of 49 bpm, max HR of 207 bpm, and avg HR of 75 bpm.   Predominant underlying rhythm was Sinus Rhythm. Slight P wave morphology changes were noted.   17 Supraventricular Tachycardia runs occurred, the run with the fastest interval lasting  5 beats with a maximum rate of 207 bpm, the longest lasting 12 beats with an average rate of 106 bpm.    Premature atrial complexes were occasional (1.9%, [id number]). Premature ventricular complexes were rare.    No ventricular tachycardia, no pauses, no atrial fibrillation noted.   Symptoms associated with premature atrial complex.   Conclusion: The study is remarkable for paroxysmal supraventricular tachycardia and symptomatic premature atrial complex.  Recent Labs: 04/23/2020: ALT 11; Hemoglobin 15.2; Platelets 230.0 07/11/2020: BUN 18; Creatinine, Ser 0.95; Magnesium 2.4; NT-Pro BNP 136; Potassium 4.6; Sodium 143  Recent Lipid Panel    Component Value Date/Time   CHOL 109 04/23/2020 0948   TRIG 62.0 04/23/2020 0948   HDL 43.20 04/23/2020 0948   CHOLHDL 3 04/23/2020 0948   VLDL 12.4 04/23/2020 0948   LDLCALC 53 04/23/2020 0948   LDLCALC 56 10/25/2019 0816   LDLDIRECT 135.0 09/11/2014 1024    Physical Exam:    VS:  BP 140/72 (BP Location: Right Arm, Patient Position: Sitting, Cuff Size: Normal)   Pulse 62   Ht '5\' 7"'$  (1.702 m)   Wt 175 lb 1.9 oz (79.4  kg)   SpO2 99%   BMI 27.43 kg/m     Wt Readings from Last 3 Encounters:  09/10/20 175 lb 1.9 oz (79.4 kg)  07/11/20 178 lb (80.7 kg)  06/26/20 177 lb 4 oz (80.4 kg)     GEN: Well nourished, well developed in no acute distress HEENT: Normal NECK: No JVD; No carotid bruits LYMPHATICS: No lymphadenopathy CARDIAC: S1S2 noted,RRR, no murmurs, rubs, gallops RESPIRATORY:  Clear to auscultation without rales, wheezing or rhonchi  ABDOMEN: Soft, non-tender, non-distended, +bowel sounds, no guarding. EXTREMITIES: No edema, No  cyanosis, no clubbing MUSCULOSKELETAL:  No deformity  SKIN: Warm and dry NEUROLOGIC:  Alert and oriented x 3, non-focal PSYCHIATRIC:  Normal affect, good insight  ASSESSMENT:    1. PSVT (paroxysmal supraventricular tachycardia) ([place])   2. 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 PAC (premature atrial contraction)   4. Hyperlipidemia, unspecified hyperlipidemia type    PLAN:     1.  His symptoms has improved remarkably.  We will continue patient on his current medication regimen.  His bilateral leg edema also has improved.  2.  No angina symptoms continue his simvastatin and aspirin. 3.  There is evidence of paroxysmal SVT as well as PACs on his monitor he had been started on low-dose beta-blocker which seems to have improved his symptoms as well. 4.  Talked about the testing results, there is questionable liver cyst appreciated on his echocardiogram.  I will defer to his PCP for dedicated liver/abdominal ultrasound.  The patient is in agreement with the above plan. The patient left the office in stable condition.  The patient will follow up in 1 year   Medication Adjustments/Labs and Tests Ordered: Current medicines are reviewed at length with the patient today.  Concerns regarding medicines are outlined above.  Orders Placed This Encounter  Procedures   EKG 12-Lead   Meds ordered  this encounter  Medications   aspirin 81 MG chewable tablet    Sig: Chew 1 tablet (81 mg total) by mouth daily.    Dispense:  90 tablet    Refill:  3   furosemide (LASIX) 20 MG tablet    Sig: Take 20 mg (one tablet) on Tuesday, Thursday and Saturday    Dispense:  90 tablet    Refill:  3   metoprolol succinate (TOPROL XL) 25 MG 24 hr tablet    Sig: Take 0.5 tablets (12.5 mg total) by mouth daily.    Dispense:  45 tablet    Refill:  3   potassium chloride (KLOR-CON) 10 MEQ tablet    Sig: Take 10 meq (one tablet) on Tuesday, Thursday and Saturday    Dispense:  90 tablet    Refill:  3   simvastatin (ZOCOR) 20 MG tablet    Sig: Take 1 tablet (20 mg total) by mouth at bedtime.    Dispense:  90 tablet    Refill:  3   NITROSTAT 0.4 MG SL tablet    Sig: DISSOLVE ONE TABLET UNDER THE TONGUE EVERY 5 MINUTES AS NEEDED.    Dispense:  25 tablet    Refill:  1    Patient Instructions  Medication Instructions:  Your physician recommends that you continue on your current medications as directed. Please refer to the Current Medication list given to you today.  *If you need a refill on your cardiac medications before your next appointment, please call your pharmacy*   Lab Work: None If you have labs (blood work) drawn today and your tests are completely normal, you will receive your results only by: [place] (if you have MyChart) OR A paper copy in the mail If you have any lab test that is abnormal or we need to change your treatment, we will call you to review the results.   Testing/Procedures: None   Follow-Up: At [facility], you and your health needs are our priority.  As part of our continuing mission to provide you with exceptional heart care, we have created designated  Provider Care Teams.  These Care Teams include your primary Cardiologist (physician) and Advanced Practice Providers (APPs -  Physician Assistants and Nurse Practitioners) who all work together to provide  you with the care you need, when you need it.  We recommend signing up for the patient portal called "MyChart".  Sign up information is provided on this After Visit Summary.  MyChart is used to connect with patients for Virtual Visits (Telemedicine).  Patients are able to view lab/test results, encounter notes, upcoming appointments, etc.  Non-urgent messages can be sent to your provider as well.   To learn more about what you can do with MyChart, go to [email or web address].    Your next appointment:   1 year(s)  The format for your next appointment:   In Person  Provider:   [place], DO [address], [place], [place] [address]    Other Instructions    Adopting a Healthy Lifestyle.  Know what a healthy weight is for you (roughly BMI <25) and aim to maintain this   Aim for 7+ servings of fruits and vegetables daily   65-80+ fluid ounces of water or unsweet tea for healthy kidneys   Limit to max 1 drink of alcohol per day; avoid smoking/tobacco   Limit animal fats in diet for cholesterol and heart health - choose grass fed whenever available   Avoid highly processed foods, and foods high in saturated/trans fats   Aim for low stress - take time to unwind and care for your mental health   Aim for 150 min of moderate intensity exercise weekly for heart health, and weights twice weekly for bone health   Aim for 7-9 hours of sleep daily   When it comes to diets, agreement about the perfect plan isnt easy to find, even among the experts. Experts at the [employer] developed an idea known as the Healthy Eating Plate. Just imagine a plate divided into logical, healthy portions.   The emphasis is on diet quality:   Load up on vegetables and fruits - one-half of your plate: Aim for color and variety, and remember that potatoes dont count.   Go for whole grains - one-quarter of your plate: Whole wheat, barley, wheat berries, quinoa,  oats, brown rice, and foods made with them. If you want pasta, go with whole wheat pasta.   Protein power - one-quarter of your plate: Fish, chicken, beans, and nuts are all healthy, versatile protein sources. Limit red meat.   The diet, however, does go beyond the plate, offering a few other suggestions.   Use healthy plant oils, such as olive, canola, soy, corn, sunflower and peanut. Check the labels, and avoid partially hydrogenated oil, which have unhealthy trans fats.   If youre thirsty, drink water. Coffee and tea are good in moderation, but skip sugary drinks and limit milk and dairy products to one or two daily servings.   The type of carbohydrate in the diet is more important than the amount. Some sources of carbohydrates, such as vegetables, fruits, whole grains, and beans-are healthier than others.   Finally, stay active  Signed, Berniece Salines, DO  09/10/2020 2:51 PM    [facility]

## 2020-09-18 ENCOUNTER — Encounter: Payer: Self-pay | Admitting: Physician Assistant

## 2020-09-18 ENCOUNTER — Ambulatory Visit: Payer: Medicare Other | Admitting: Physician Assistant

## 2020-09-18 ENCOUNTER — Other Ambulatory Visit (INDEPENDENT_AMBULATORY_CARE_PROVIDER_SITE_OTHER): Payer: Medicare Other

## 2020-09-18 VITALS — BP 122/62 | HR 68 | Ht 64.75 in | Wt 175.8 lb

## 2020-09-18 DIAGNOSIS — K5909 Other constipation: Secondary | ICD-10-CM | POA: Diagnosis not present

## 2020-09-18 DIAGNOSIS — K7689 Other specified diseases of liver: Secondary | ICD-10-CM

## 2020-09-18 LAB — CBC WITH DIFFERENTIAL/PLATELET
Basophils Absolute: 0 10*3/uL (ref 0.0–0.1)
Basophils Relative: 0.6 % (ref 0.0–3.0)
Eosinophils Absolute: 0 10*3/uL (ref 0.0–0.7)
Eosinophils Relative: 0.6 % (ref 0.0–5.0)
HCT: 43.4 % (ref 39.0–52.0)
Hemoglobin: 14.8 g/dL (ref 13.0–17.0)
Lymphocytes Relative: 20.3 % (ref 12.0–46.0)
Lymphs Abs: 1.2 10*3/uL (ref 0.7–4.0)
MCHC: 34 g/dL (ref 30.0–36.0)
MCV: 92.4 fl (ref 78.0–100.0)
Monocytes Absolute: 0.5 10*3/uL (ref 0.1–1.0)
Monocytes Relative: 8.8 % (ref 3.0–12.0)
Neutro Abs: 4.1 10*3/uL (ref 1.4–7.7)
Neutrophils Relative %: 69.7 % (ref 43.0–77.0)
Platelets: 226 10*3/uL (ref 150.0–400.0)
RBC: 4.7 Mil/uL (ref 4.22–5.81)
RDW: 13.2 % (ref 11.5–15.5)
WBC: 5.8 10*3/uL (ref 4.0–10.5)

## 2020-09-18 LAB — COMPREHENSIVE METABOLIC PANEL
ALT: 13 U/L (ref 0–53)
AST: 19 U/L (ref 0–37)
Albumin: 4.1 g/dL (ref 3.5–5.2)
Alkaline Phosphatase: 65 U/L (ref 39–117)
BUN: 14 mg/dL (ref 6–23)
CO2: 30 mEq/L (ref 19–32)
Calcium: 9.2 mg/dL (ref 8.4–10.5)
Chloride: 105 mEq/L (ref 96–112)
Creatinine, Ser: 1.06 mg/dL (ref 0.40–1.50)
GFR: 64.06 mL/min (ref >60.00)
Glucose, Bld: 84 mg/dL (ref 70–99)
Potassium: 4.5 mEq/L (ref 3.5–5.1)
Sodium: 141 mEq/L (ref 135–145)
Total Bilirubin: 0.8 mg/dL (ref 0.2–1.2)
Total Protein: 6.8 g/dL (ref 6.0–8.3)

## 2020-09-18 LAB — PROTIME-INR
INR: 1.1 ratio — ABNORMAL HIGH (ref 0.8–1.0)
Prothrombin Time: 11.8 s (ref 9.6–13.1)

## 2020-09-18 NOTE — Patient Instructions (Signed)
Your provider has requested that you go to the basement level for lab work before leaving today. Press "B" on the elevator. The lab is located at the first door on the left as you exit the elevator.  If you are age 85 or older, your body mass index should be between 23-30. Your Body mass index is 29.48 kg/m. If this is out of the aforementioned range listed, please consider follow up with your Primary Care Provider.  If you are age 97 or younger, your body mass index should be between 19-25. Your Body mass index is 29.48 kg/m. If this is out of the aformentioned range listed, please consider follow up with your Primary Care Provider.   __________________________________________________________  The [facility] providers would like to encourage you to use [facility] to communicate with providers for non-urgent requests or questions.  Due to long hold times on the telephone, sending your provider a message by [facility] may be a faster and more efficient way to get a response.  Please allow 48 business hours for a response.  Please remember that this is for non-urgent requests.

## 2020-09-18 NOTE — Progress Notes (Signed)
Chief Complaint: Liver cyst  HPI:    Robert Pacheco is an 87 year old male with a past medical history as listed below including CAD (07/31/2020 echo with LVEF 60-65%), known to Dr. Carlean Purl, who was referred to me by Shelda Pal* for a complaint of liver cyst.      11/01/2015 colonoscopy with enlarged prostate, 4 4-6 mm polyps in the transverse colon, otherwise normal.  Pathology showed 4 small adenomas.  No repeat recommended due to age.    08/04/2020 patient had an abdominal ultrasound for liver cyst seen on echo of his heart.  There were multiple cysts seen throughout the liver.  The largest in the right hepatic lobe measuring 6.7 cm.  They varied in size from small to relatively large.  Portal vein is patent.  Gallbladder wall also measured 4.1 mm which is mildly thickened.    Today, the patient tells me that he is feeling fine.  "What spurred" all of this work-up is that he was sometimes dizzy in the morning after standing and trying to go do something.  He then saw his PCP who referred him to the cardiology who told him that his "heart was fine", they did tell him though that his heart "sometimes skips beats".  He tells me that he thinks it was all related to his mattress because he has changed mattresses and sleeps better and has had no issues over the past 3 to 4 weeks.      Does describe chronic constipation issues for which he uses 2 Colace tabs twice a week which works well for him.      Denies any abdominal pain, weight loss, heartburn, reflux, nausea, vomiting or changes in bowel habits.    Past Medical History:  Diagnosis Date   Allergy    Coronary artery disease    Hyperlipidemia    Hypertension    Personal history of colonic adenomas 05/10/2012   Skin cancer, basal cell     Past Surgical History:  Procedure Laterality Date   CATARACT EXTRACTION     COLONOSCOPY  multiple   CORONARY ARTERY BYPASS GRAFT  2000   EYE SURGERY     HERNIA REPAIR     MOHS SURGERY       Current Outpatient Medications  Medication Sig Dispense Refill   aspirin 81 MG chewable tablet Chew 1 tablet (81 mg total) by mouth daily. 90 tablet 3   furosemide (LASIX) 20 MG tablet Take 20 mg (one tablet) on Tuesday, Thursday and Saturday 90 tablet 3   ketoconazole (NIZORAL) 2 % cream Apply topically 2 (two) times daily as needed for irritation. 60 g 2   metoprolol succinate (TOPROL XL) 25 MG 24 hr tablet Take 0.5 tablets (12.5 mg total) by mouth daily. 45 tablet 3   NITROSTAT 0.4 MG SL tablet DISSOLVE ONE TABLET UNDER THE TONGUE EVERY 5 MINUTES AS NEEDED. 25 tablet 1   potassium chloride (KLOR-CON) 10 MEQ tablet Take 10 meq (one tablet) on Tuesday, Thursday and Saturday 90 tablet 3   simvastatin (ZOCOR) 20 MG tablet Take 1 tablet (20 mg total) by mouth at bedtime. 90 tablet 3   No current facility-administered medications for this visit.    Allergies as of 09/18/2020 - Review Complete 09/10/2020  Allergen Reaction Noted   Other Nausea Only 06/06/2014    Family History  Problem Relation Age of Onset   Heart disease Father    Colon cancer Brother 77   Healthy Child  x 4   Healthy Grandchild        x 11   Colon cancer Sister 34   Prostate cancer Brother     Social History   Socioeconomic History   Marital status: Widowed    Spouse name: Not on file   Number of children: 4   Years of education: Not on file   Highest education level: Not on file  Occupational History   Occupation: retired    Comment: Education administrator  Tobacco Use   Smoking status: Former   Smokeless tobacco: Never  Scientific laboratory technician Use: Never used  Substance and Sexual Activity   Alcohol use: No   Drug use: No   Sexual activity: Not on file  Other Topics Concern   Not on file  Social History Narrative   Not on file   Social Determinants of Health   Financial Resource Strain: Low Risk    Difficulty of Paying Living Expenses: Not hard at all  Food Insecurity: No Food  Insecurity   Worried About Charity fundraiser in the Last Year: Never true   Arboriculturist in the Last Year: Never true  Transportation Needs: No Transportation Needs   Lack of Transportation (Medical): No   Lack of Transportation (Non-Medical): No  Physical Activity: Inactive   Days of Exercise per Week: 0 days   Minutes of Exercise per Session: 0 min  Stress: No Stress Concern Present   Feeling of Stress : Not at all  Social Connections: Moderately Integrated   Frequency of Communication with Friends and Family: More than three times a week   Frequency of Social Gatherings with Friends and Family: Once a week   Attends Religious Services: More than 4 times per year   Active Member of [employer] or Organizations: No   Attends Music therapist: Never   Marital Status: Married  Human resources officer Violence: Not At Risk   Fear of Current or Ex-Partner: No   Emotionally Abused: No   Physically Abused: No   Sexually Abused: No    Review of Systems:    Constitutional: No weight loss, fever or chills Skin: No rash Cardiovascular: No chest pain Respiratory: No SOB  Gastrointestinal: See HPI and otherwise negative Genitourinary: No dysuria  Neurological: No headache Musculoskeletal: No new muscle or joint pain Hematologic: No bleeding Psychiatric: No history of depression or anxiety   Physical Exam:  Vital signs: BP 122/62   Pulse 68   Ht 5' 4.75" (1.645 m)   Wt 175 lb 12.8 oz (79.7 kg)   BMI 29.48 kg/m    Constitutional:   Pleasant Caucasian male appears to be in NAD, Well developed, Well nourished, alert and cooperative Head:  Normocephalic and atraumatic. Eyes:   PEERL, EOMI. No icterus. Conjunctiva pink. Ears:  Normal auditory acuity. Neck:  Supple Throat: Oral cavity and pharynx without inflammation, swelling or lesion.  Respiratory: Respirations even and unlabored. Lungs clear to auscultation bilaterally.   No wheezes, crackles, or rhonchi.  Cardiovascular:  Normal S1, S2. No MRG. Regular rate and rhythm. No peripheral edema, cyanosis or pallor.  Gastrointestinal:  Soft, nondistended, nontender. No rebound or guarding. Normal bowel sounds. No appreciable masses or hepatomegaly. Rectal:  Not performed.  Msk:  Symmetrical without gross deformities. Without edema, no deformity or joint abnormality.  Neurologic:  Alert and  oriented x4;  grossly normal neurologically.  Skin:   Dry and intact without significant lesions or rashes. Psychiatric:  Demonstrates good judgement and reason without abnormal affect or behaviors.  RECENT LABS (See HPI for Imaging):  CMP     Component Value Date/Time   NA 143 07/11/2020 1444   K 4.6 07/11/2020 1444   CL 103 07/11/2020 1444   CO2 22 07/11/2020 1444   GLUCOSE 106 (H) 07/11/2020 1444   GLUCOSE 103 (H) 04/23/2020 0948   BUN 18 07/11/2020 1444   CREATININE 0.95 07/11/2020 1444   CREATININE 1.03 10/25/2019 0816   CALCIUM 9.4 07/11/2020 1444   PROT 6.7 04/23/2020 0948   ALBUMIN 4.5 04/23/2020 0948   AST 16 04/23/2020 0948   ALT 11 04/23/2020 0948   ALKPHOS 73 04/23/2020 0948   BILITOT 0.6 04/23/2020 0948   GFRNONAA 69.57 03/27/2009 0000   GFRAA 94 03/02/2008 0000    Assessment: 1.  Liver cysts: Incidentally seen on recent heart echo which led to ultrasound, imaging as in HPI; most likely benign cysts 2.  Chronic constipation: Better with 2 Colace twice a week  Plan: 1.  Continue stool softeners twice a week. 2.  Discussed liver cysts.  Radiologist did not mention any concerning features for mucinous neoplasm.  Patient has no symptoms at all.  Typically there are no further recommendations unless he starts with pain in this area or starts being symptomatic.  Did discuss this with him today. 3.  We will check a CBC, CMP, PT and INR today. 4.  Patient to follow in clinic per recommendations after labs.  Ellouise Newer, PA-C [facility] 09/18/2020, 10:21 AM  Cc: Shelda Pal*

## 2020-09-21 ENCOUNTER — Other Ambulatory Visit: Payer: Self-pay

## 2020-09-21 ENCOUNTER — Telehealth: Payer: Self-pay | Admitting: Family Medicine

## 2020-09-21 ENCOUNTER — Encounter: Payer: Self-pay | Admitting: Family Medicine

## 2020-09-21 ENCOUNTER — Ambulatory Visit: Payer: Medicare Other | Admitting: Family Medicine

## 2020-09-21 VITALS — BP 140/70 | HR 66 | Temp 98.3°F | Ht 66.0 in | Wt 178.0 lb

## 2020-09-21 DIAGNOSIS — K5909 Other constipation: Secondary | ICD-10-CM

## 2020-09-21 MED ORDER — LINACLOTIDE 72 MCG PO CAPS: 72.0000 ug | ORAL_CAPSULE | Freq: Every day | ORAL | 2 refills | Status: DC

## 2020-09-21 NOTE — Telephone Encounter (Signed)
Pt. Called in and wanted to know instructions regarding medication use after his appointment today 8/26: he wants to know does he take the mirlax and dulcolax at the same time or wait until he is done with the mirlax then start the dulcolax

## 2020-09-21 NOTE — Telephone Encounter (Signed)
Called the patient and informed of PCP instructions

## 2020-09-21 NOTE — Telephone Encounter (Signed)
Scheduled today at 11:45 AM with PCP for Constipation.

## 2020-09-21 NOTE — Progress Notes (Signed)
Chief Complaint  Patient presents with   Constipation    Subjective: Patient is a 87 y.o. male here for chronic constipation.  Takes a laxative, Dulcolax at baseline. 7 d ago was his last BM. Used a suppository 1 d ago that was not particularly helpful. Diet has been fair without changes. He stays hydrated. No abd pain, N/V, fevers, rectal pain. Pt is still passing gas. He did just start Lasix and Klor Con from the cardiology. Things got worse after this.   Past Medical History:  Diagnosis Date   Allergy    Coronary artery disease    Hyperlipidemia    Hypertension    Personal history of colonic adenomas 05/10/2012   Skin cancer, basal cell     Objective: BP 140/70   Pulse 66   Temp 98.3 F (36.8 C) (Oral)   Ht '5\' 6"'$  (1.676 m)   Wt 178 lb (80.7 kg)   SpO2 99%   BMI 28.73 kg/m  General: Awake, appears stated age Abd: BS+, NT, moderately distended.  Heart: RRR Lungs: CTAB, no rales, wheezes or rhonchi. No accessory muscle use Psych: Age appropriate judgment and insight, normal affect and mood  Assessment and Plan: Chronic constipation - Plan: linaclotide (LINZESS) 72 MCG capsule  Chronic, unstable.  Continue Dulcolax and Metamucil.  Stay hydrated.  This is likely an adverse effect from metoprolol and Lasix.  In the meanwhile, we will have him take MiraLAX for the next 3 days and use an enema if no improvement.  If he does not have improvement we will add Linzess.  I am seeing him next week and if he is not improving, we will refer him to gastroenterology. The patient voiced understanding and agreement to the plan.  [place], DO 09/21/20  12:00 PM

## 2020-09-21 NOTE — Patient Instructions (Addendum)
Try MiraLAX 1-2 times daily over the next 3-4 days. If no improvement, try using an enema. Stay well hydrated and keep lots of fiber in your diet.  Stay hydrated.   Continue Dulcolax and Metamucil.   If this does not help, I have sent in a new prescription to take. Wait 3-4 days for the MiraLAX/enema.   Let us know if you need anything.

## 2020-09-21 NOTE — Telephone Encounter (Signed)
Pt is having issues with constipation. Stated he has taken laxatives 2xs this week and still isnt able to go. He has the urge to go but nothing occurs.

## 2020-09-21 NOTE — Telephone Encounter (Signed)
The patient verbalized understanding./ok to take both.

## 2020-09-24 ENCOUNTER — Ambulatory Visit: Payer: Medicare Other | Admitting: Family Medicine

## 2020-09-26 ENCOUNTER — Encounter: Payer: Self-pay | Admitting: Family Medicine

## 2020-09-26 ENCOUNTER — Ambulatory Visit (INDEPENDENT_AMBULATORY_CARE_PROVIDER_SITE_OTHER): Payer: Medicare Other | Admitting: Family Medicine

## 2020-09-26 ENCOUNTER — Other Ambulatory Visit: Payer: Self-pay

## 2020-09-26 VITALS — BP 122/62 | HR 64 | Temp 98.2°F | Ht 66.0 in | Wt 176.0 lb

## 2020-09-26 DIAGNOSIS — D692 Other nonthrombocytopenic purpura: Secondary | ICD-10-CM | POA: Diagnosis not present

## 2020-09-26 DIAGNOSIS — Z0000 Encounter for general adult medical examination without abnormal findings: Secondary | ICD-10-CM | POA: Diagnosis not present

## 2020-09-26 DIAGNOSIS — M67959 Unspecified disorder of synovium and tendon, unspecified thigh: Secondary | ICD-10-CM | POA: Diagnosis not present

## 2020-09-26 DIAGNOSIS — E785 Hyperlipidemia, unspecified: Secondary | ICD-10-CM

## 2020-09-26 NOTE — Patient Instructions (Addendum)
Give [place] 2-3 business days to get the results of your labs back.   Keep the diet clean and stay active.  I recommend getting the flu shot in mid October. This suggestion would change if the CDC comes out with a different recommendation.   The new Shingrix vaccine (for shingles) is a 2 shot series. It can make people feel low energy, achy and almost like they have the flu for 48 hours after injection. Please plan accordingly when deciding on when to get this shot. Call the pharmacy for an appointment to get this. The second shot of the series is less severe regarding the side effects, but it still lasts 48 hours.   Heat (pad or rice pillow in microwave) over affected area, 10-15 minutes twice daily.   Ice/cold pack over area for 10-15 min twice daily.  OK to take Tylenol 1000 mg (2 extra strength tabs) or 975 mg (3 regular strength tabs) every 6 hours as needed.  Let us know if you need anything.  Gluteus Medius Syndrome Rehab It is normal to feel mild stretching, pulling, tightness, or discomfort as you do these exercises, but you should stop right away if you feel sudden pain or your pain gets worse.   Stretching and range of motion exercise This exercise warms up your muscles and joints and improves the movement and flexibility of your hip and pelvis. This exercise also helps to relieve pain and stiffness. Exercise A: Lunge (hip flexor stretch)      Kneel on the floor on your left / right knee. Bend your other knee so it is directly over your ankle. Keep good posture with your head over your shoulders. Tuck your tailbone underneath you. This will prevent your back from arching too much. You should feel a gentle stretch in the front of your thigh or hip. If you do not feel a stretch, slowly lunge forward with your chest up. Hold this position for 30 seconds. Slowly return to the starting position. Repeat 2 times. Complete this exercise 3 times per week. Strengthening exercises These  exercises build strength and endurance in your hip and pelvis. Endurance is the ability to use your muscles for a long time, even after they get tired. Exercise B: Bridge (hip extensors)     Lie on your back on a firm surface with your knees bent and your feet flat on the floor. Tighten your buttocks muscles and lift your bottom off the floor until the trunk of your body is level with your thighs. You should feel the muscles working in your buttocks and the back of your thighs. If this exercise is too easy, cross your arms over your chest or lift one leg while your bottom is up off the floor. Do not arch your back. Hold this position for 3 seconds. Slowly lower your hips to the starting position. Let your muscles relax completely between repetitions. Repeat 2 times. Complete this exercise 3 times per week. Exercise C: Straight leg raises (hip abductors)     Lie on your side with your left / right leg in the top position. Lie so your head, shoulder, knee, and hip line up. Bend your bottom knee to help you balance. Lift your top leg up 4-6 inches (10-15 cm), keeping your toes pointed straight ahead. Hold this position for 2 seconds. Slowly lower your leg to the starting position and let your muscles relax completely. Repeat for a total of 10 repetitions. Repeat 2 times. Complete this exercise  3 times per week. Exercise D: Hip abductors and external rotators, quadruped Get on your hands and knees on a firm, lightly padded surface. Your hands should be directly below your shoulders, and your knees should be directly below your hips. Lift your left / right knee out to the side. Keep your knee bent. Do not twist your body. Hold this position for 3 seconds. Slowly lower your leg. Repeat for a total of 10 repetitions.  Repeat 2 times. Complete this exercise 3 times per week. Exercise E: Single leg stand Stand near a counter or door frame to hold onto as needed. It is helpful to look in a mirror  for this exercise so you can watch your hip. Squeeze your left / right buttock muscles then lift up your other foot. Do not let your left / right hip push out to the side. Hold this position for 3 seconds. Repeat for a total of 10 repetitions. Repeat 2 times. Complete this exercise 3 times per week. Make sure you discuss any questions you have with your health care provider. Document Released: 01/13/2005 Document Revised: 09/20/2015 Document Reviewed: 12/26/2014 Elsevier Interactive Patient Education  [employer].

## 2020-09-26 NOTE — Progress Notes (Signed)
Chief Complaint  Patient presents with   Annual Exam    Well Male Robert Pacheco is here for a complete physical.   His last physical was >1 year ago.  Current diet: in general, a "healthy" diet.   Current exercise: none; was walking on treadmill routinely.  Weight trend: stable Fatigue out of ordinary? No. Seat belt? Yes.    Health maintenance Shingrix- No Tetanus- Yes Pneumonia vaccine- Yes  Had a BM 2 d ago, did not need to start Linzess. Started Toprol and lasix prior to this. He increased his water intake also.   Chronic low back pain now migrating down to his buttock b/l. Equal on both sides. Starting to hurt him when he sits down. No inj or change in activity. Started to migrate down things. No neuro s/s's. Has not tried anything. No bruising, swelling or redness.   Past Medical History:  Diagnosis Date   Allergy    Coronary artery disease    Hyperlipidemia    Hypertension    Personal history of colonic adenomas 05/10/2012   Skin cancer, basal cell      Past Surgical History:  Procedure Laterality Date   CATARACT EXTRACTION     COLONOSCOPY  multiple   CORONARY ARTERY BYPASS GRAFT  2000   EYE SURGERY     HERNIA REPAIR     MOHS SURGERY      Medications  Current Outpatient Medications on File Prior to Visit  Medication Sig Dispense Refill   aspirin 81 MG chewable tablet Chew 1 tablet (81 mg total) by mouth daily. 90 tablet 3   bisacodyl (DULCOLAX) 5 MG EC tablet Take 5 mg by mouth daily as needed for moderate constipation.     furosemide (LASIX) 20 MG tablet Take 20 mg (one tablet) on Tuesday, Thursday and Saturday 90 tablet 3   ketoconazole (NIZORAL) 2 % cream Apply topically 2 (two) times daily as needed for irritation. 60 g 2   metoprolol succinate (TOPROL XL) 25 MG 24 hr tablet Take 0.5 tablets (12.5 mg total) by mouth daily. 45 tablet 3   NITROSTAT 0.4 MG SL tablet DISSOLVE ONE TABLET UNDER THE TONGUE EVERY 5 MINUTES AS NEEDED. 25 tablet 1   Omega-3 Fatty  Acids (FISH OIL) 1000 MG CAPS Take 2 capsules by mouth daily at 12 noon.     potassium chloride (KLOR-CON) 10 MEQ tablet Take 10 meq (one tablet) on Tuesday, Thursday and Saturday 90 tablet 3   simvastatin (ZOCOR) 20 MG tablet Take 1 tablet (20 mg total) by mouth at bedtime. 90 tablet 3   Allergies Allergies  Allergen Reactions   Other Nausea Only    UNKNOWN PAIN MED   Family History Family History  Problem Relation Age of Onset   Heart disease Father    Colon cancer Brother 32   Healthy Child        x 4   Healthy Grandchild        x 11   Colon cancer Sister 49   Prostate cancer Brother    Review of Systems: Constitutional:  no fevers Eye:  no recent significant change in vision Ears:  No changes in hearing Nose/Mouth/Throat:  no complaints of nasal congestion, no sore throat Cardiovascular: no chest pain Respiratory:  No shortness of breath Gastrointestinal:  No change in bowel habits GU:  No frequency Integumentary:  no abnormal skin lesions reported Neurologic:  no headaches Endocrine:  denies unexplained weight changes  Exam BP 122/62  Pulse 64   Temp 98.2 F (36.8 C) (Oral)   Ht '5\' 6"'$  (1.676 m)   Wt 176 lb (79.8 kg)   SpO2 99%   BMI 28.41 kg/m  General:  well developed, well nourished, in no apparent distress Skin:  no significant moles, warts, or growths Head:  no masses, lesions, or tenderness Eyes:  pupils equal and round, sclera anicteric without injection Ears:  canals without lesions, TMs shiny without retraction, no obvious effusion, no erythema Nose:  nares patent, septum midline, mucosa normal Throat/Pharynx:  lips and gingiva without lesion; tongue and uvula midline; non-inflamed pharynx; no exudates or postnasal drainage Lungs:  clear to auscultation, breath sounds equal bilaterally, no respiratory distress Cardio:  regular rate and rhythm, no LE edema or bruits Rectal: Deferred GI: BS+, S, NT, ND, no masses or organomegaly Musculoskeletal:  mild ttp over b/l glute muscles and greater troch; neg straight leg, no ttp over SI jt or lumbar region; poor hamstring rom b/l; symmetrical muscle groups noted without atrophy or deformity Neuro:  gait slow; deep tendon reflexes normal and symmetric Psych: well oriented with normal range of affect and appropriate judgment/insight  Assessment and Plan  Well adult exam  Hyperlipidemia, unspecified hyperlipidemia type - Plan: Lipid panel  Senile purpura (HCC)  Tendinopathy of gluteus medius   Well 87 y.o. male. Counseled on diet and exercise. Declines Shingrix. Flu shot rec'd for mid Oct. Glute med tend: stretches/exercises, heat, Tylenol, ice. PT if no better in 3-4 weeks.  Other orders as above. Follow up in 6 mo or prn.  The patient voiced understanding and agreement to the plan.  [place], DO 09/26/20 11:11 AM

## 2021-03-05 ENCOUNTER — Ambulatory Visit (INDEPENDENT_AMBULATORY_CARE_PROVIDER_SITE_OTHER): Payer: Medicare Other

## 2021-03-05 VITALS — Ht 66.0 in | Wt 176.0 lb

## 2021-03-05 DIAGNOSIS — Z0000 Encounter for general adult medical examination without abnormal findings: Secondary | ICD-10-CM | POA: Diagnosis not present

## 2021-03-05 NOTE — Patient Instructions (Signed)
Robert Pacheco , Thank you for taking time to complete  your Medicare Wellness Visit. I appreciate your ongoing commitment to your health goals. Please review the following plan we discussed and let me know if I can assist you in the future.   Screening recommendations/referrals: Colonoscopy: No longer required. Recommended yearly ophthalmology/optometry visit for glaucoma screening and checkup Recommended yearly dental visit for hygiene and checkup  Vaccinations: Influenza vaccine: up to date Pneumococcal vaccine: Up to date Tdap vaccine: Up to date Shingles vaccine: Due-May obtain vaccine at our local pharmacy. Covid-19: Up to date  Advanced directives: Please bring a copy of Living Will and/or Healthcare Power of Attorney for your chart.   Conditions/risks identified: See problem list  Next appointment: Follow up in one year for your annual wellness visit.   Preventive Care 17 Years and Older, Male Preventive care refers to lifestyle choices and visits with your health care provider that can promote health and wellness. What does preventive care include? A yearly physical exam. This is also called an annual well check. Dental exams once or twice a year. Routine eye exams. Ask your health care provider how often you should have your eyes checked. Personal lifestyle choices, including: Daily care of your teeth and gums. Regular physical activity. Eating a healthy diet. Avoiding tobacco and drug use. Limiting alcohol use. Practicing safe sex. Taking low doses of aspirin every day. Taking vitamin and mineral supplements as recommended by your health care provider. What happens during an annual well check? The services and screenings done by your health care provider during your annual well check will depend on your age, overall health, lifestyle risk factors, and family history of disease. Counseling  Your health care provider may ask you questions about your: Alcohol use. Tobacco  use. Drug use. Emotional well-being. Home and relationship well-being. Sexual activity. Eating habits. History of falls. Memory and ability to understand (cognition). Work and work Statistician. Screening  You may have the following tests or measurements: Height, weight, and BMI. Blood pressure. Lipid and cholesterol levels. These may be checked every 5 years, or more frequently if you are over 69 years old. Skin check. Lung cancer screening. You may have this screening every year starting at age 57 if you have a 30-pack-year history of smoking and currently smoke or have quit within the past 15 years. Fecal occult blood test (FOBT) of the stool. You may have this test every year starting at age 75. Flexible sigmoidoscopy or colonoscopy. You may have a sigmoidoscopy every 5 years or a colonoscopy every 10 years starting at age 52. Prostate cancer screening. Recommendations will vary depending on your family history and other risks. Hepatitis C blood test. Hepatitis B blood test. Sexually transmitted disease (STD) testing. Diabetes screening. This is done by checking your blood sugar (glucose) after you have not eaten for a while (fasting). You may have this done every 1-3 years. Abdominal aortic aneurysm (AAA) screening. You may need this if you are a current or former smoker. Osteoporosis. You may be screened starting at age 53 if you are at high risk. Talk with your health care provider about your test results, treatment options, and if necessary, the need for more tests. Vaccines  Your health care provider may recommend certain vaccines, such as: Influenza vaccine. This is recommended every year. Tetanus, diphtheria, and acellular pertussis (Tdap, Td) vaccine. You may need a Td booster every 10 years. Zoster vaccine. You may need this after age 69. Pneumococcal 13-valent conjugate (PCV13)  vaccine. One dose is recommended after age 62. Pneumococcal polysaccharide (PPSV23) vaccine.  One dose is recommended after age 78. Talk to your health care provider about which screenings and vaccines you need and how often you need them. This information is not intended to replace advice given to you by your health care provider. Make sure you discuss any questions you have with your health care provider. Document Released: 02/09/2015 Document Revised: 10/03/2015 Document Reviewed: 11/14/2014 Elsevier Interactive Patient Education  2017 [place] Prevention in the Home Falls can cause injuries. They can happen to people of all ages. There are many things you can do to make your home safe and to help prevent falls. What can I do on the outside of my home? Regularly fix the edges of walkways and driveways and fix any cracks. Remove anything that might make you trip as you walk through a door, such as a raised step or threshold. Trim any bushes or trees on the path to your home. Use bright outdoor lighting. Clear any walking paths of anything that might make someone trip, such as rocks or tools. Regularly check to see if handrails are loose or broken. Make sure that both sides of any steps have handrails. Any raised decks and porches should have guardrails on the edges. Have any leaves, snow, or ice cleared regularly. Use sand or salt on walking paths during winter. Clean up any spills in your garage right away. This includes oil or grease spills. What can I do in the bathroom? Use night lights. Install grab bars by the toilet and in the tub and shower. Do not use towel bars as grab bars. Use non-skid mats or decals in the tub or shower. If you need to sit down in the shower, use a plastic, non-slip stool. Keep the floor dry. Clean up any water that spills on the floor as soon as it happens. Remove soap buildup in the tub or shower regularly. Attach bath mats securely with double-sided non-slip rug tape. Do not have throw rugs and other things on the floor that can make  you trip. What can I do in the bedroom? Use night lights. Make sure that you have a light by your bed that is easy to reach. Do not use any sheets or blankets that are too big for your bed. They should not hang down onto the floor. Have a firm chair that has side arms. You can use this for support while you get dressed. Do not have throw rugs and other things on the floor that can make you trip. What can I do in the kitchen? Clean up any spills right away. Avoid walking on wet floors. Keep items that you use a lot in easy-to-reach places. If you need to reach something above you, use a strong step stool that has a grab bar. Keep electrical cords out of the way. Do not use floor polish or wax that makes floors slippery. If you must use wax, use non-skid floor wax. Do not have throw rugs and other things on the floor that can make you trip. What can I do with my stairs? Do not leave any items on the stairs. Make sure that there are handrails on both sides of the stairs and use them. Fix handrails that are broken or loose. Make sure that handrails are as long as the stairways. Check any carpeting to make sure that it is firmly attached to the stairs. Fix any carpet that is loose  or worn. Avoid having throw rugs at the top or bottom of the stairs. If you do have throw rugs, attach them to the floor with carpet tape. Make sure that you have a light switch at the top of the stairs and the bottom of the stairs. If you do not have them, ask someone to add them for you. What else can I do to help prevent falls? Wear shoes that: Do not have high heels. Have rubber bottoms. Are comfortable and fit you well. Are closed at the toe. Do not wear sandals. If you use a stepladder: Make sure that it is fully opened. Do not climb a closed stepladder. Make sure that both sides of the stepladder are locked into place. Ask someone to hold it for you, if possible. Clearly mark and make sure that you can  see: Any grab bars or handrails. First and last steps. Where the edge of each step is. Use tools that help you move around (mobility aids) if they are needed. These include: Canes. Walkers. Scooters. Crutches. Turn on the lights when you go into a dark area. Replace any light bulbs as soon as they burn out. Set up your furniture so you have a clear path. Avoid moving your furniture around. If any of your floors are uneven, fix them. If there are any pets around you, be aware of where they are. Review your medicines with your doctor. Some medicines can make you feel dizzy. This can increase your chance of falling. Ask your doctor what other things that you can do to help prevent falls. This information is not intended to replace advice given to you by your health care provider. Make sure you discuss any questions you have with your health care provider. Document Released: 11/09/2008 Document Revised: 06/21/2015 Document Reviewed: 02/17/2014 Elsevier Interactive Patient Education  2017 [employer].

## 2021-03-05 NOTE — Progress Notes (Signed)
Subjective:   Robert Pacheco is a 88 y.o. male who presents for Medicare Annual/Subsequent preventive examination.  I connected with Jahvier today by telephone and verified that I am speaking with the correct person using two identifiers. Location patient: home Location provider: work Persons participating in the virtual visit: patient, Marine scientist.    I discussed the limitations, risks, security and privacy concerns of performing an evaluation and management service by telephone and the availability of in person appointments. I also discussed with the patient that there may be a patient responsible charge related to this service. The patient expressed understanding and verbally consented to this telephonic visit.    Interactive audio and video telecommunications were attempted between this provider and patient, however failed, due to patient having technical difficulties OR patient did not have access to video capability.  We continued and completed visit with audio only.  Some vital signs may be absent or patient reported.   Time Spent with patient on telephone encounter: 20 minutes   Review of Systems     Cardiac Risk Factors include: advanced age (>47men, >65 women);hypertension;male gender;dyslipidemia     Objective:    Today's Vitals   03/05/21 0821  Weight: 176 lb (79.8 kg)  Height: 5\' 6"  (1.676 m)   Body mass index is 28.41 kg/m.  Advanced Directives 03/05/2021 12/15/2019 08/01/2019 10/15/2018 10/12/2017 10/08/2016 08/09/2016  Does Patient Have a Medical Advance Directive? Yes Yes No Yes No Yes No  Type of Paramedic of [place];Living will [place];Living will - [place];Living will - [place];Living will -  Does patient want to make changes to medical advance directive? - - - No - Patient declined - - -  Copy of [place] in Chart? No - copy requested No - copy requested - No - copy  requested - - -  Would patient like information on creating a medical advance directive? - - No - Patient declined - No - Patient declined - -    Current Medications (verified) Outpatient Encounter Medications as of 03/05/2021  Medication Sig   aspirin 81 MG chewable tablet Chew 1 tablet (81 mg total) by mouth daily.   bisacodyl (DULCOLAX) 5 MG EC tablet Take 5 mg by mouth daily as needed for moderate constipation.   ketoconazole (NIZORAL) 2 % cream Apply topically 2 (two) times daily as needed for irritation.   metoprolol succinate (TOPROL XL) 25 MG 24 hr tablet Take 0.5 tablets (12.5 mg total) by mouth daily.   NITROSTAT 0.4 MG SL tablet DISSOLVE ONE TABLET UNDER THE TONGUE EVERY 5 MINUTES AS NEEDED.   Omega-3 Fatty Acids (FISH OIL) 1000 MG CAPS Take 2 capsules by mouth daily at 12 noon.   simvastatin (ZOCOR) 20 MG tablet Take 1 tablet (20 mg total) by mouth at bedtime.   furosemide (LASIX) 20 MG tablet Take 20 mg (one tablet) on Tuesday, Thursday and Saturday (Patient not taking: Reported on 03/05/2021)   potassium chloride (KLOR-CON) 10 MEQ tablet Take 10 meq (one tablet) on Tuesday, Thursday and Saturday (Patient not taking: Reported on 03/05/2021)   No facility-administered encounter medications on file as of 03/05/2021.    Allergies (verified) Other   History: Past Medical History:  Diagnosis Date   Allergy    Coronary artery disease    Hyperlipidemia    Hypertension    Personal history of colonic adenomas 05/10/2012   Skin cancer, basal cell    Past  Surgical History:  Procedure Laterality Date   CATARACT EXTRACTION     COLONOSCOPY  multiple   CORONARY ARTERY BYPASS GRAFT  2000   EYE SURGERY     HERNIA REPAIR     MOHS SURGERY     Family History  Problem Relation Age of Onset   Heart disease Father    Colon cancer Brother 35   Healthy Child        x 4   Healthy Grandchild        x 11   Colon cancer Sister 22   Prostate cancer Brother    Social History    Socioeconomic History   Marital status: Widowed    Spouse name: Not on file   Number of children: 4   Years of education: Not on file   Highest education level: Not on file  Occupational History   Occupation: retired    Comment: Education administrator  Tobacco Use   Smoking status: Former   Smokeless tobacco: Never  Scientific laboratory technician Use: Never used  Substance and Sexual Activity   Alcohol use: No   Drug use: No   Sexual activity: Not on file  Other Topics Concern   Not on file  Social History Narrative   Not on file   Social Determinants of Health   Financial Resource Strain: Low Risk    Difficulty of Paying Living Expenses: Not hard at all  Food Insecurity: No Food Insecurity   Worried About Charity fundraiser in the Last Year: Never true   Arboriculturist in the Last Year: Never true  Transportation Needs: No Transportation Needs   Lack of Transportation (Medical): No   Lack of Transportation (Non-Medical): No  Physical Activity: Insufficiently Active   Days of Exercise per Week: 3 days   Minutes of Exercise per Session: 20 min  Stress: No Stress Concern Present   Feeling of Stress : Not at all  Social Connections: Moderately Isolated   Frequency of Communication with Friends and Family: More than three times a week   Frequency of Social Gatherings with Friends and Family: More than three times a week   Attends Religious Services: More than 4 times per year   Active Member of [employer] or Organizations: No   Attends Archivist Meetings: Never   Marital Status: Widowed    Tobacco Counseling Counseling given: Not Answered   Clinical Intake:  Pre-visit preparation completed: Yes  Pain : No/denies pain     BMI - recorded: 28.41 Nutritional Status: BMI 25 -29 Overweight Nutritional Risks: None Diabetes: No  How often do you need to have someone help you when you read instructions, pamphlets, or other written materials from your doctor or  pharmacy?: 1 - Never  Diabetic?No  Interpreter Needed?: No  Information entered by :: Caroleen Hamman LPN   Activities of Daily Living In your present state of health, do you have any difficulty performing the following activities: 03/05/2021  Hearing? Y  Comment hearing aid right ear  Vision? N  Difficulty concentrating or making decisions? N  Walking or climbing stairs? N  Dressing or bathing? N  Doing errands, shopping? N  Preparing Food and eating ? N  Using the Toilet? N  In the past six months, have you accidently leaked urine? N  Do you have problems with loss of bowel control? N  Managing your Medications? N  Managing your Finances? N  Housekeeping or managing your Housekeeping?  N  Some recent data might be hidden    Patient Care Team: Shelda Pal, DO as PCP - General (Family Medicine) Berniece Salines, DO as PCP - Cardiology (Cardiology)  Indicate any recent Medical Services you may have received from other than [facility] providers in the past year (date may be approximate).     Assessment:   This is a routine wellness examination for Robert Pacheco.  Hearing/Vision screen Hearing Screening - Comments:: Hearing aid right ear Vision Screening - Comments:: Last eye exam-01/2021  Dietary issues and exercise activities discussed: Current Exercise Habits: Home exercise routine, Type of exercise: treadmill, Time (Minutes): 15, Frequency (Times/Week): 3, Weekly Exercise (Minutes/Week): 45, Intensity: Mild, Exercise limited by: None identified   Goals Addressed               This Visit's Progress     Patient Stated     Maintain current health (pt-stated)   On track     Other     Patient Stated        Increase exercise       Depression Screen PHQ 2/9 Scores 03/05/2021 06/26/2020 12/15/2019 10/25/2019 10/15/2018 10/12/2017 10/08/2016  PHQ - 2 Score 0 0 0 0 0 0 0    Fall Risk Fall Risk  03/05/2021 06/26/2020 12/15/2019 10/15/2018 10/12/2017  Falls in the past year? 0 0 0  1 No  Number falls in past yr: 0 0 0 0 -  Comment - - - - -  Injury with Fall? 0 0 0 0 -  Risk for fall due to : - No Fall Risks - - -  Follow up Falls prevention discussed Falls evaluation completed Falls prevention discussed - -    FALL RISK PREVENTION PERTAINING TO THE HOME:  Any stairs in or around the home? No  Home free of loose throw rugs in walkways, pet beds, electrical cords, etc? Yes  Adequate lighting in your home to reduce risk of falls? Yes   ASSISTIVE DEVICES UTILIZED TO PREVENT FALLS:  Life alert? No  Use of a cane, walker or w/c? No  Grab bars in the bathroom? Yes  Shower chair or bench in shower? No  Elevated toilet seat or a handicapped toilet? No   TIMED UP AND GO:  Was the test performed? No . Phone visit   Cognitive Function:Normal cognitive status assessed by this Nurse Health Advisor. No abnormalities found.   MMSE - Mini Mental State Exam 10/12/2017 10/08/2016  Orientation to time 5 5  Orientation to Place 5 5  Registration 3 3  Attention/ Calculation 4 4  Recall 1 2  Language- name 2 objects 2 2  Language- repeat 1 1  Language- follow 3 step command 3 3  Language- read & follow direction 1 1  Write a sentence 1 1  Copy design 1 0  Total score 27 27        Immunizations Immunization History  Administered Date(s) Administered   Fluad Quad(high Dose 65+) 10/15/2018   Influenza Split 12/31/2010, 01/14/2012   Influenza Whole 12/03/2006, 11/10/2007, 10/31/2008, 12/10/2009   Influenza, High Dose Seasonal PF 12/15/2012, 12/13/2013   Influenza,inj,Quad PF,6+ Mos 10/25/2019   Influenza-Unspecified 02/26/2017, 10/27/2020   PFIZER(Purple Top)SARS-COV-2 Vaccination 02/16/2019, 03/09/2019, 12/13/2019   Pfizer Covid-19 Vaccine Bivalent Booster 5y-11y 10/27/2020   Pneumococcal Conjugate-13 07/28/2013   Pneumococcal Polysaccharide-23 01/27/2001, 03/02/2007   Td 01/28/2004   Tdap 09/11/2014    TDAP status: Up to date  Flu Vaccine status: Up to  date  Pneumococcal  vaccine status: Up to date  Covid-19 vaccine status: Completed vaccines  Qualifies for Shingles Vaccine? Yes   Zostavax completed No   Shingrix Completed?: No.    Education has been provided regarding the importance of this vaccine. Patient has been advised to call insurance company to determine out of pocket expense if they have not yet received this vaccine. Advised may also receive vaccine at local pharmacy or Health Dept. Verbalized acceptance and understanding.  Screening Tests Health Maintenance  Topic Date Due   TETANUS/TDAP  09/10/2024   Pneumonia Vaccine 21+ Years old  Completed   INFLUENZA VACCINE  Completed   COVID-19 Vaccine  Completed   HPV VACCINES  Aged Out   Zoster Vaccines- Shingrix  Discontinued    Health Maintenance  There are no preventive care reminders to display for this patient.   Colorectal cancer screening: No longer required.   Lung Cancer Screening: (Low Dose CT Chest recommended if Age 29-80 years, 30 pack-year currently smoking OR have quit w/in 15years.) does not qualify.     Additional Screening:  Hepatitis C Screening: does not qualify  Vision Screening: Recommended annual ophthalmology exams for early detection of glaucoma and other disorders of the eye. Is the patient up to date with their annual eye exam?  Yes  Who is the provider or what is the name of the office in which the patient attends annual eye exams? [facility]   Dental Screening: Recommended annual dental exams for proper oral hygiene  Community Resource Referral / Chronic Care Management: CRR required this visit?  No   CCM required this visit?  No      Plan:     I have personally reviewed and noted the following in the patients chart:   Medical and social history Use of alcohol, tobacco or illicit drugs  Current medications and supplements including opioid prescriptions. Patient is not currently taking opioid prescriptions. Functional ability  and status Nutritional status Physical activity Advanced directives List of other physicians Hospitalizations, surgeries, and ER visits in previous 12 months Vitals Screenings to include cognitive, depression, and falls Referrals and appointments  In addition, I have reviewed and discussed with patient certain preventive protocols, quality metrics, and best practice recommendations. A written personalized care plan for preventive services as well as general preventive health recommendations were provided to patient.   Due to this being a telephonic visit, the after visit summary with patients personalized plan was offered to patient via mail or my-chart. Patient preferred to pick up at office at next visit.   Marta Antu, LPN   02/01/8370  Nurse Health Advisor  Nurse Notes: None

## 2021-03-12 ENCOUNTER — Other Ambulatory Visit: Payer: Self-pay | Admitting: Family Medicine

## 2021-03-27 ENCOUNTER — Ambulatory Visit: Payer: Medicare Other | Admitting: Family Medicine

## 2021-03-27 ENCOUNTER — Encounter: Payer: Self-pay | Admitting: Family Medicine

## 2021-03-27 VITALS — BP 122/64 | HR 68 | Temp 98.7°F | Resp 16 | Ht 66.0 in | Wt 178.2 lb

## 2021-03-27 DIAGNOSIS — I10 Essential (primary) hypertension: Secondary | ICD-10-CM

## 2021-03-27 DIAGNOSIS — D692 Other nonthrombocytopenic purpura: Secondary | ICD-10-CM | POA: Diagnosis not present

## 2021-03-27 DIAGNOSIS — I25701 Atherosclerosis of coronary artery bypass graft(s), unspecified, with angina pectoris with documented spasm: Secondary | ICD-10-CM

## 2021-03-27 DIAGNOSIS — E785 Hyperlipidemia, unspecified: Secondary | ICD-10-CM

## 2021-03-27 DIAGNOSIS — I471 Supraventricular tachycardia: Secondary | ICD-10-CM

## 2021-03-27 LAB — COMPREHENSIVE METABOLIC PANEL
ALT: 9 U/L (ref 0–53)
AST: 16 U/L (ref 0–37)
Albumin: 4.2 g/dL (ref 3.5–5.2)
Alkaline Phosphatase: 75 U/L (ref 39–117)
BUN: 16 mg/dL (ref 6–23)
CO2: 29 mEq/L (ref 19–32)
Calcium: 9.3 mg/dL (ref 8.4–10.5)
Chloride: 106 mEq/L (ref 96–112)
Creatinine, Ser: 1.08 mg/dL (ref 0.40–1.50)
GFR: 62.41 mL/min (ref >60.00)
Glucose, Bld: 93 mg/dL (ref 70–99)
Potassium: 4.5 mEq/L (ref 3.5–5.1)
Sodium: 140 mEq/L (ref 135–145)
Total Bilirubin: 1 mg/dL (ref 0.2–1.2)
Total Protein: 6.5 g/dL (ref 6.0–8.3)

## 2021-03-27 LAB — LIPID PANEL
Cholesterol: 107 mg/dL (ref 0–200)
HDL: 39.4 mg/dL (ref >39.00)
LDL Cholesterol: 52 mg/dL (ref 0–99)
NonHDL: 67.59
Total CHOL/HDL Ratio: 3
Triglycerides: 79 mg/dL (ref 0.0–149.0)
VLDL: 15.8 mg/dL (ref 0.0–40.0)

## 2021-03-27 LAB — CBC
HCT: 44.9 % (ref 39.0–52.0)
Hemoglobin: 14.6 g/dL (ref 13.0–17.0)
MCHC: 32.4 g/dL (ref 30.0–36.0)
MCV: 93.5 fl (ref 78.0–100.0)
Platelets: 232 10*3/uL (ref 150.0–400.0)
RBC: 4.8 Mil/uL (ref 4.22–5.81)
RDW: 13.5 % (ref 11.5–15.5)
WBC: 5.8 10*3/uL (ref 4.0–10.5)

## 2021-03-27 NOTE — Progress Notes (Signed)
Chief Complaint  ?Patient presents with  ? Follow-up  ?  Here for follow up  ? ? ?Subjective ?Robert Pacheco is a 88 y.o. male who presents for hypertension follow up. ?He does not monitor home blood pressures. ?He is compliant with medication- Toprol XL 12.5 mg/d. ?Patient has these side effects of medication: none ?He is usually adhering to a healthy diet overall. ?Current exercise: walking ?No Cp or SOB.  ? ?Hyperlipidemia ?Patient presents for dyslipidemia follow up. ?Currently being treated with Zocor 20 mg/d and compliance with treatment thus far has been good. ?He denies myalgias. ?Diet/exercise as above.  ?The patient is known to have coexisting coronary artery disease. ?  ?Past Medical History:  ?Diagnosis Date  ? Allergy   ? Coronary artery disease   ? Hyperlipidemia   ? Hypertension   ? Personal history of colonic adenomas 05/10/2012  ? Skin cancer, basal cell   ? ? ?Exam ?BP 122/64 (BP Location: Right Arm, Patient Position: Sitting, Cuff Size: Normal)   Pulse 68   Temp 98.7 ?F (37.1 ?C) (Oral)   Resp 16   Ht 5\' 6"  (1.676 m)   Wt 178 lb 3.2 oz (80.8 kg)   SpO2 98%   BMI 28.76 kg/m?  ?General:  well developed, well nourished, in no apparent distress ?Heart: RRR, no bruits, no LE edema ?Lungs: clear to auscultation, no accessory muscle use ?Psych: well oriented with normal range of affect and appropriate judgment/insight ? ?Essential hypertension - Plan: CBC ? ?Hyperlipidemia, unspecified hyperlipidemia type - Plan: Lipid panel, Comprehensive metabolic panel ? ?Senile purpura ([place]) ? ?PSVT (paroxysmal supraventricular tachycardia) ([place]) ? 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 ?Chronic, stable. Cont Toprol XL 12.5 mg/d. Counseled on diet and exercise. ?Chronic, stable. Cont Zocor 20 mg/d.  ?F/u in 6 mo or prn. ?The patient voiced understanding and agreement to the plan. ? ?Shelda Pal, DO ?03/27/21  ?9:11  AM ? ?

## 2021-03-27 NOTE — Patient Instructions (Signed)
Stay hydrated. ? ?Keep the diet clean and stay active. ? ?Give [place] 2-3 business days to get the results of your labs back.  ? ?Let us know if you need anything. ?

## 2021-10-01 ENCOUNTER — Encounter: Payer: Self-pay | Admitting: Family Medicine

## 2021-10-01 ENCOUNTER — Ambulatory Visit (INDEPENDENT_AMBULATORY_CARE_PROVIDER_SITE_OTHER): Payer: Medicare Other | Admitting: Family Medicine

## 2021-10-01 VITALS — BP 122/62 | HR 63 | Temp 98.0°F | Ht 66.5 in | Wt 181.2 lb

## 2021-10-01 DIAGNOSIS — H6123 Impacted cerumen, bilateral: Secondary | ICD-10-CM

## 2021-10-01 DIAGNOSIS — I25701 Atherosclerosis of coronary artery bypass graft(s), unspecified, with angina pectoris with documented spasm: Secondary | ICD-10-CM

## 2021-10-01 DIAGNOSIS — L570 Actinic keratosis: Secondary | ICD-10-CM

## 2021-10-01 DIAGNOSIS — L989 Disorder of the skin and subcutaneous tissue, unspecified: Secondary | ICD-10-CM

## 2021-10-01 DIAGNOSIS — Z0000 Encounter for general adult medical examination without abnormal findings: Secondary | ICD-10-CM | POA: Diagnosis not present

## 2021-10-01 LAB — COMPREHENSIVE METABOLIC PANEL
ALT: 11 U/L (ref 0–53)
AST: 16 U/L (ref 0–37)
Albumin: 4.1 g/dL (ref 3.5–5.2)
Alkaline Phosphatase: 74 U/L (ref 39–117)
BUN: 15 mg/dL (ref 6–23)
CO2: 28 mEq/L (ref 19–32)
Calcium: 9.2 mg/dL (ref 8.4–10.5)
Chloride: 107 mEq/L (ref 96–112)
Creatinine, Ser: 1.05 mg/dL (ref 0.40–1.50)
GFR: 64.33 mL/min (ref >60.00)
Glucose, Bld: 88 mg/dL (ref 70–99)
Potassium: 4.6 mEq/L (ref 3.5–5.1)
Sodium: 142 mEq/L (ref 135–145)
Total Bilirubin: 0.7 mg/dL (ref 0.2–1.2)
Total Protein: 6.6 g/dL (ref 6.0–8.3)

## 2021-10-01 LAB — LIPID PANEL
Cholesterol: 101 mg/dL (ref 0–200)
HDL: 39 mg/dL — ABNORMAL LOW (ref >39.00)
LDL Cholesterol: 49 mg/dL (ref 0–99)
NonHDL: 62.32
Total CHOL/HDL Ratio: 3
Triglycerides: 67 mg/dL (ref 0.0–149.0)
VLDL: 13.4 mg/dL (ref 0.0–40.0)

## 2021-10-01 MED ORDER — NITROSTAT 0.4 MG SL SUBL: SUBLINGUAL_TABLET | SUBLINGUAL | 1 refills | Status: DC

## 2021-10-01 MED ORDER — SIMVASTATIN 20 MG PO TABS: 20.0000 mg | ORAL_TABLET | Freq: Every day | ORAL | 3 refills | Status: DC

## 2021-10-01 MED ORDER — FUROSEMIDE 20 MG PO TABS: ORAL_TABLET | ORAL | 3 refills | Status: DC

## 2021-10-01 MED ORDER — KETOCONAZOLE 2 % EX CREA: TOPICAL_CREAM | CUTANEOUS | 0 refills | Status: DC

## 2021-10-01 MED ORDER — METOPROLOL SUCCINATE ER 25 MG PO TB24: 12.5000 mg | ORAL_TABLET | Freq: Every day | ORAL | 3 refills | Status: DC

## 2021-10-01 MED ORDER — POTASSIUM CHLORIDE ER 10 MEQ PO TBCR: EXTENDED_RELEASE_TABLET | ORAL | 3 refills | Status: DC

## 2021-10-01 NOTE — Patient Instructions (Addendum)
Give [place] 2-3 business days to get the results of your labs back.   Keep the diet clean and stay active.  I recommend getting the flu shot in mid October. This suggestion would change if the CDC comes out with a different recommendation.   Please get me a copy of your advanced directive form at your convenience.   If you do not hear anything about your referral in the next 1-2 weeks, call our office and ask for an update.  OK to use Debrox (peroxide) in the ear to loosen up wax. Also recommend using a bulb syringe (for removing boogers from baby's noses) to flush through warm water and vinegar (3-4:1 ratio). An alternative, though more expensive, is an elephant ear washer wax removal kit. Do not use Q-tips as this can impact wax further.  Let us know if you need anything.

## 2021-10-01 NOTE — Progress Notes (Signed)
Chief Complaint  Patient presents with   Annual Exam    Well Male Robert Pacheco is here for a complete physical.   His last physical was >1 year ago.  Current diet: in general, diet is fair.   Current exercise: active in yard Weight trend: stable Fatigue out of ordinary? No. Seat belt? Yes.   Advanced directive? Yes  Health maintenance Shingrix- No Tetanus- Yes Pneumonia vaccine- Yes  Skin lesions Hx of skin cancer, not following w Derm. Lesions on ear/scalp. No bleeding, drainage, pain, itching, new topicals. Has not been using anything to tx them.   Past Medical History:  Diagnosis Date   Allergy    Coronary artery disease    Hyperlipidemia    Hypertension    Personal history of colonic adenomas 05/10/2012   Skin cancer, basal cell      Past Surgical History:  Procedure Laterality Date   CATARACT EXTRACTION     COLONOSCOPY  multiple   CORONARY ARTERY BYPASS GRAFT  2000   EYE SURGERY     HERNIA REPAIR     MOHS SURGERY      Medications  Current Outpatient Medications on File Prior to Visit  Medication Sig Dispense Refill   aspirin 81 MG chewable tablet Chew 1 tablet (81 mg total) by mouth daily. 90 tablet 3   bisacodyl (DULCOLAX) 5 MG EC tablet Take 5 mg by mouth daily as needed for moderate constipation.     furosemide (LASIX) 20 MG tablet Take 20 mg (one tablet) on Tuesday, Thursday and Saturday 90 tablet 3   ketoconazole (NIZORAL) 2 % cream APPLY TOPICALLY TWICE DAILY AS NEEDED FOR IRRITATION. 60 g 0   metoprolol succinate (TOPROL XL) 25 MG 24 hr tablet Take 0.5 tablets (12.5 mg total) by mouth daily. 45 tablet 3   NITROSTAT 0.4 MG SL tablet DISSOLVE ONE TABLET UNDER THE TONGUE EVERY 5 MINUTES AS NEEDED. 25 tablet 1   Omega-3 Fatty Acids (FISH OIL) 1000 MG CAPS Take 2 capsules by mouth daily at 12 noon.     potassium chloride (KLOR-CON) 10 MEQ tablet Take 10 meq (one tablet) on Tuesday, Thursday and Saturday 90 tablet 3   simvastatin (ZOCOR) 20 MG tablet Take 1  tablet (20 mg total) by mouth at bedtime. 90 tablet 3    Allergies Allergies  Allergen Reactions   Other Nausea Only    UNKNOWN PAIN MED    Family History Family History  Problem Relation Age of Onset   Heart disease Father    Colon cancer Brother 22   Healthy Child        x 4   Healthy Grandchild        x 11   Colon cancer Sister 20   Prostate cancer Brother     Review of Systems: Constitutional:  no fevers Eye:  no recent significant change in vision Ears:  No changes in hearing Nose/Mouth/Throat:  no complaints of nasal congestion, no sore throat Cardiovascular: no chest pain Respiratory:  No shortness of breath Gastrointestinal:  No change in bowel habits GU:  No frequency Integumentary:  no abnormal skin lesions reported (though several found) Neurologic:  no headaches Endocrine:  denies unexplained weight changes  Exam BP 122/62   Pulse 63   Temp 98 F (36.7 C) (Oral)   Ht 5' 6.5" (1.689 m)   Wt 181 lb 4 oz (82.2 kg)   SpO2 98%   BMI 28.82 kg/m  General:  well developed, well nourished,  in no apparent distress Skin: scaly lesions w pink base over L ear, L are over angle of mandible and posterior scalp; there is also a raised lesion over the R posterior ear without ulceration, fluctuance or ttp; otherwise no significant moles, warts, or growths on exposed skin; SK's noted over back Head:  no masses, lesions, or tenderness Eyes:  pupils equal and round, sclera anicteric without injection Ears:  100% obstructed b/l with cerumen Nose:  nares patent, septum midline, mucosa normal Throat/Pharynx:  lips and gingiva without lesion; tongue and uvula midline; non-inflamed pharynx; no exudates or postnasal drainage Lungs:  clear to auscultation, breath sounds equal bilaterally, no respiratory distress Cardio:  regular rhythm, bradycardic, 2+ pitting b/l LE edema tapering at mid tibia Rectal: Deferred GI: BS+, S, NT, ND, no masses or organomegaly Musculoskeletal:   symmetrical muscle groups noted without atrophy or deformity Neuro:  gait normal; deep tendon reflexes normal and symmetric Psych: well oriented with normal range of affect and appropriate judgment/insight  Procedure note: cryotherapy Verbal consent obtained 5 skin lesions treated Liquid nitrogen was applied via a thin spray creating an ice ball with 1-2 mm corona surrounding the lesion The patient tolerated the procedure well There were no immediate complications noted  Procedure note: Cerumen removal irrigation Verbal consent obtained. Robert Pacheco, CMA performed procedure. A mixture of warm water and Dulcolax was used to irrigate ear. Cerumen successfully removed. Pt reported immediate improvement. Pt tolerated procedure well. There were no immediate complications noted.  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. Counseled on diet and exercise. Shingrix rec'd.  There is a skin lesion under his hearing aid on the right behind his ear that I would like a dermatologist to take a look at.  If he cannot get in in a reasonable time, I will remove it in the office. Actinic keratoses: 5 lesions frozen today.  He can follow-up with the dermatology team if they do not improve. Cerumen impaction: Removal today via irrigation.  Home care instructions also verbalized. Other orders as above. Follow up in 6 mo or prn.  The patient voiced understanding and agreement to the plan.  [place], DO 10/02/21 7:27 AM

## 2021-11-22 ENCOUNTER — Other Ambulatory Visit: Payer: Self-pay | Admitting: Cardiology

## 2021-11-22 DIAGNOSIS — I25701 Atherosclerosis of coronary artery bypass graft(s), unspecified, with angina pectoris with documented spasm: Secondary | ICD-10-CM

## 2022-01-02 ENCOUNTER — Telehealth: Payer: Self-pay | Admitting: Family Medicine

## 2022-01-02 ENCOUNTER — Other Ambulatory Visit: Payer: Self-pay | Admitting: Cardiology

## 2022-01-02 ENCOUNTER — Other Ambulatory Visit: Payer: Self-pay | Admitting: Family Medicine

## 2022-01-02 DIAGNOSIS — I25701 Atherosclerosis of coronary artery bypass graft(s), unspecified, with angina pectoris with documented spasm: Secondary | ICD-10-CM

## 2022-01-02 MED ORDER — SIMVASTATIN 20 MG PO TABS: 20.0000 mg | ORAL_TABLET | Freq: Every day | ORAL | 0 refills | Status: DC

## 2022-01-02 NOTE — Telephone Encounter (Signed)
Pt called stating he would like to see if Dr. Nani Ravens could refill the following Rx. Please Advise.  Prescription Request  01/02/2022  Is this a "Controlled Substance" medicine? No  LOV: 10/01/2021  What is the name of the medication or equipment?   simvastatin (ZOCOR) 20 MG tablet [[id number]]   Have you contacted your pharmacy to request a refill? Yes   Which pharmacy would you like this sent to?  [facility], [place] [address] Phone: [phone] Fax: [phone]    Patient notified that their request is being sent to the clinical staff for review and that they should receive a response within 2 business days.   Please advise at Mobile [phone] (mobile)

## 2022-01-02 NOTE — Telephone Encounter (Signed)
Rx sent 

## 2022-01-03 ENCOUNTER — Telehealth: Payer: Self-pay | Admitting: Family Medicine

## 2022-01-03 DIAGNOSIS — I25701 Atherosclerosis of coronary artery bypass graft(s), unspecified, with angina pectoris with documented spasm: Secondary | ICD-10-CM

## 2022-01-03 MED ORDER — SIMVASTATIN 20 MG PO TABS: 20.0000 mg | ORAL_TABLET | Freq: Every day | ORAL | 3 refills | Status: DC

## 2022-01-03 NOTE — Telephone Encounter (Signed)
Patient called to advise that the pharmacy will not release his simvastatin (ZOCOR) 20 MG tablet  prescription because it says that he needs to schedule an annual visit before future refills will be given. Patient had his annual visit 10/01/21 and he has a follow up scheduled for 03/31/22. Please advise pharmacy and notify patient when refill sent.

## 2022-01-03 NOTE — Telephone Encounter (Signed)
Sent in refill

## 2022-02-05 ENCOUNTER — Telehealth: Payer: Self-pay | Admitting: Family Medicine

## 2022-02-05 DIAGNOSIS — I25701 Atherosclerosis of coronary artery bypass graft(s), unspecified, with angina pectoris with documented spasm: Secondary | ICD-10-CM

## 2022-02-05 MED ORDER — SIMVASTATIN 20 MG PO TABS: 20.0000 mg | ORAL_TABLET | Freq: Every day | ORAL | 3 refills | Status: DC

## 2022-02-05 NOTE — Telephone Encounter (Signed)
Patient informed refill done

## 2022-02-05 NOTE — Telephone Encounter (Signed)
Medication: simvastatin (ZOCOR) 20 MG tablet  Has the patient contacted their pharmacy? No.   Preferred Pharmacy:   [facility] [address] [place] - [place], [place] [address] Phone: [phone]  Fax: [phone]

## 2022-03-07 ENCOUNTER — Ambulatory Visit (INDEPENDENT_AMBULATORY_CARE_PROVIDER_SITE_OTHER): Payer: Medicare Other | Admitting: *Deleted

## 2022-03-07 DIAGNOSIS — Z0000 Encounter for general adult medical examination without abnormal findings: Secondary | ICD-10-CM

## 2022-03-07 NOTE — Progress Notes (Signed)
Subjective:   [name] is a 89 y.o. male who presents for Medicare Annual/Subsequent preventive examination.  I connected with  Robert Pacheco on 03/07/22 by a audio enabled telemedicine application and verified that I am speaking with the correct person using two identifiers.  Patient Location: Home  Provider Location: Office/Clinic  I discussed the limitations of evaluation and management by telemedicine. The patient expressed understanding and agreed to proceed.   Review of Systems    Defer to PCP Cardiac Risk Factors include: advanced age (>17mn, >>61women);male gender;dyslipidemia;hypertension     Objective:    There were no vitals filed for this visit. There is no height or weight on file to calculate BMI.     03/07/2022    9:41 AM 03/05/2021    8:26 AM 12/15/2019    3:25 PM 08/01/2019   10:02 AM 10/15/2018    8:25 AM 10/12/2017    3:09 PM 10/08/2016    8:39 AM  Advanced Directives  Does Patient Have a Medical Advance Directive? Yes Yes Yes No Yes No Yes  Type of AParamedicof [place]Sea[place]Living will [place]everly HillsLiving will [place]Living will  [place]aderoLiving will  [place]Living will  Does patient want to make changes to medical advance directive? No - Patient declined    No - Patient declined    Copy of [place]in Chart? No - copy requested No - copy requested No - copy requested  No - copy requested    Would patient like information on creating a medical advance directive?    No - Patient declined  No - Patient declined     Current Medications (verified) Outpatient Encounter Medications as of 03/07/2022  Medication Sig   aspirin 81 MG chewable tablet Chew 1 tablet (81 mg total) by mouth daily.   bisacodyl (DULCOLAX) 5 MG EC tablet Take 5 mg by mouth daily as needed for moderate constipation.   furosemide (LASIX) 20 MG tablet Take 20 mg (one  tablet) on Tuesday, Thursday and Saturday   ketoconazole (NIZORAL) 2 % cream APPLY TOPICALLY TWICE DAILY AS NEEDED FOR IRRITATION.   metoprolol succinate (TOPROL XL) 25 MG 24 hr tablet Take 0.5 tablets (12.5 mg total) by mouth daily.   NITROSTAT 0.4 MG SL tablet DISSOLVE ONE TABLET UNDER THE TONGUE EVERY 5 MINUTES AS NEEDED.   Omega-3 Fatty Acids (FISH OIL) 1000 MG CAPS Take 2 capsules by mouth daily at 12 noon.   potassium chloride (KLOR-CON) 10 MEQ tablet Take 10 meq (one tablet) on Tuesday, Thursday and Saturday   simvastatin (ZOCOR) 20 MG tablet Take 1 tablet (20 mg total) by mouth daily at 6 PM.   No facility-administered encounter medications on file as of 03/07/2022.    Allergies (verified) Other   History: Past Medical History:  Diagnosis Date   Allergy    Coronary artery disease    Hyperlipidemia    Hypertension    Personal history of colonic adenomas 05/10/2012   Skin cancer, basal cell    Past Surgical History:  Procedure Laterality Date   CATARACT EXTRACTION     COLONOSCOPY  multiple   CORONARY ARTERY BYPASS GRAFT  2000   EYE SURGERY     HERNIA REPAIR     MOHS SURGERY     Family History  Problem Relation Age of Onset   Heart disease Father    Colon cancer Brother 726  Healthy Child        x 4   Healthy Grandchild        x 11   Colon cancer Sister 74   Prostate cancer Brother    Social History   Socioeconomic History   Marital status: Widowed    Spouse name: Not on file   Number of children: 4   Years of education: Not on file   Highest education level: Not on file  Occupational History   Occupation: retired    Comment: Education administrator  Tobacco Use   Smoking status: Former   Smokeless tobacco: Never  Scientific laboratory technician Use: Never used  Substance and Sexual Activity   Alcohol use: No   Drug use: No   Sexual activity: Not on file  Other Topics Concern   Not on file  Social History Narrative   Not on file   Social Determinants of Health    Financial Resource Strain: Low Risk  (03/05/2021)   Overall Financial Resource Strain (CARDIA)    Difficulty of Paying Living Expenses: Not hard at all  Food Insecurity: No Food Insecurity (03/07/2022)   Hunger Vital Sign    Worried About Running Out of Food in the Last Year: Never true    [place] in the Last Year: Never true  Transportation Needs: No Transportation Needs (03/07/2022)   PRAPARE - Hydrologist (Medical): No    Lack of Transportation (Non-Medical): No  Physical Activity: Insufficiently Active (03/05/2021)   Exercise Vital Sign    Days of Exercise per Week: 3 days    Minutes of Exercise per Session: 20 min  Stress: No Stress Concern Present (03/05/2021)   [place]    Feeling of Stress : Not at all  Social Connections: Moderately Isolated (03/05/2021)   Social Connection and Isolation Panel [NHANES]    Frequency of Communication with Friends and Family: More than three times a week    Frequency of Social Gatherings with Friends and Family: More than three times a week    Attends Religious Services: More than 4 times per year    Active Member of [employer] or Organizations: No    Attends Archivist Meetings: Never    Marital Status: Widowed    Tobacco Counseling Counseling given: Not Answered   Clinical Intake:  Pre-visit preparation completed: Yes  Pain : No/denies pain  Diabetes: No  How often do you need to have someone help you when you read instructions, pamphlets, or other written materials from your doctor or pharmacy?: 1 - Never  Activities of Daily Living    03/07/2022    9:43 AM  In your present state of health, do you have any difficulty performing the following activities:  Hearing? 1  Comment wears hearing aids  Vision? 0  Difficulty concentrating or making decisions? 0  Walking or climbing stairs? 0  Dressing or bathing? 0  Doing errands,  shopping? 0  Preparing Food and eating ? N  Using the Toilet? N  In the past six months, have you accidently leaked urine? N  Do you have problems with loss of bowel control? N  Managing your Medications? N  Managing your Finances? N  Housekeeping or managing your Housekeeping? N    Patient Care Team: Shelda Pal, DO as PCP - General (Family Medicine) Berniece Salines, DO as PCP - Cardiology (Cardiology)  Indicate any recent  Medical Services you may have received from other than [facility] providers in the past year (date may be approximate).     Assessment:   This is a routine wellness examination for [place].  Hearing/Vision screen No results found.  Dietary issues and exercise activities discussed: Current Exercise Habits: The patient does not participate in regular exercise at present, Exercise limited by: None identified   Goals Addressed   None    Depression Screen    03/07/2022    9:43 AM 10/01/2021    8:49 AM 03/05/2021    8:30 AM 06/26/2020   11:12 AM 12/15/2019    3:27 PM 10/25/2019    7:32 AM 10/15/2018    8:25 AM  PHQ 2/9 Scores  PHQ - 2 Score 0 0 0 0 0 0 0  PHQ- 9 Score  0         Fall Risk    03/07/2022    9:41 AM 10/01/2021    8:49 AM 03/05/2021    8:28 AM 06/26/2020   11:12 AM 12/15/2019    3:26 PM  Fall Risk   Falls in the past year? 0 0 0 0 0  Number falls in past yr: 0 0 0 0 0  Injury with Fall? 0 0 0 0 0  Risk for fall due to : No Fall Risks No Fall Risks  No Fall Risks   Follow up Falls evaluation completed Falls evaluation completed Falls prevention discussed Falls evaluation completed Falls prevention discussed    FALL RISK PREVENTION PERTAINING TO THE HOME:  Any stairs in or around the home? No  Home free of loose throw rugs in walkways, pet beds, electrical cords, etc? Yes  Adequate lighting in your home to reduce risk of falls? Yes   ASSISTIVE DEVICES UTILIZED TO PREVENT FALLS:  Life alert? No  Use of a cane, walker or w/c? No  Grab bars  in the bathroom? Yes  Shower chair or bench in shower? No  Elevated toilet seat or a handicapped toilet? No   TIMED UP AND GO:  Was the test performed?  No, audio visit .   Cognitive Function:    10/12/2017    3:10 PM 10/08/2016    8:40 AM  MMSE - Mini Mental State Exam  Orientation to time 5 5  Orientation to Place 5 5  Registration 3 3  Attention/ Calculation 4 4  Recall 1 2  Language- name 2 objects 2 2  Language- repeat 1 1  Language- follow 3 step command 3 3  Language- read & follow direction 1 1  Write a sentence 1 1  Copy design 1 0  Total score 27 27        03/07/2022    9:47 AM  6CIT Screen  What Year? 0 points  What month? 0 points  What time? 0 points  Count back from 20 0 points  Months in reverse 0 points  Repeat phrase 6 points  Total Score 6 points    Immunizations Immunization History  Administered Date(s) Administered   Fluad Quad(high Dose 65+) 10/15/2018   Influenza Split 12/31/2010, 01/14/2012   Influenza Whole 12/03/2006, 11/10/2007, 10/31/2008, 12/10/2009   Influenza, High Dose Seasonal PF 12/15/2012, 12/13/2013, 10/27/2021   Influenza,inj,Quad PF,6+ Mos 10/25/2019   Influenza-Unspecified 02/26/2017, 10/27/2020   PFIZER(Purple Top)SARS-COV-2 Vaccination 02/16/2019, 03/09/2019, 12/13/2019   Pfizer Covid-19 Vaccine Bivalent Booster 5y-11y 10/27/2020   Pneumococcal Conjugate-13 07/28/2013   Pneumococcal Polysaccharide-23 01/27/2001, 03/02/2007   Td 01/28/2004   Tdap  09/11/2014    TDAP status: Up to date  Flu Vaccine status: Up to date  Pneumococcal vaccine status: Up to date  Covid-19 vaccine status: Information provided on how to obtain vaccines.   Qualifies for Shingles Vaccine? Yes   Zostavax completed No   Shingrix Completed?: No.    Education has been provided regarding the importance of this vaccine. Patient has been advised to call insurance company to determine out of pocket expense if they have not yet received this vaccine.  Advised may also receive vaccine at local pharmacy or Health Dept. Verbalized acceptance and understanding.  Screening Tests Health Maintenance  Topic Date Due   Medicare Annual Wellness (AWV)  03/05/2022   COVID-19 Vaccine (5 - 2023-24 season) 04/01/2022 (Originally 09/27/2021)   DTaP/Tdap/Td (3 - Td or Tdap) 09/10/2024   Pneumonia Vaccine 46+ Years old  Completed   INFLUENZA VACCINE  Completed   HPV VACCINES  Aged Out   Zoster Vaccines- Shingrix  Discontinued    Health Maintenance  Health Maintenance Due  Topic Date Due   Medicare Annual Wellness (AWV)  03/05/2022    Colorectal cancer screening: No longer required.   Lung Cancer Screening: (Low Dose CT Chest recommended if Age 63-80 years, 30 pack-year currently smoking OR have quit w/in 15years.) does not qualify.   Additional Screening:  Hepatitis C Screening: does not qualify  Vision Screening: Recommended annual ophthalmology exams for early detection of glaucoma and other disorders of the eye. Is the patient up to date with their annual eye exam?  Yes  Who is the provider or what is the name of the office in which the patient attends annual eye exams? Dr. Kenton Kingfisher at [facility] If pt is not established with a provider, would they like to be referred to a provider to establish care? No .   Dental Screening: Recommended annual dental exams for proper oral hygiene  Community Resource Referral / Chronic Care Management: CRR required this visit?  No   CCM required this visit?  No      Plan:     I have personally reviewed and noted the following in the patient's chart:   Medical and social history Use of alcohol, tobacco or illicit drugs  Current medications and supplements including opioid prescriptions. Patient is not currently taking opioid prescriptions. Functional ability and status Nutritional status Physical activity Advanced directives List of other physicians Hospitalizations, surgeries, and ER visits  in previous 12 months Vitals Screenings to include cognitive, depression, and falls Referrals and appointments  In addition, I have reviewed and discussed with patient certain preventive protocols, quality metrics, and best practice recommendations. A written personalized care plan for preventive services as well as general preventive health recommendations were provided to patient.   Due to this being a telephonic visit, the after visit summary with patients personalized plan was offered to patient via mail or my-chart. Per request, patient was mailed a copy of AVS.  [name] Ship, [place]   03/07/2022   Nurse Notes: None

## 2022-03-07 NOTE — Patient Instructions (Signed)
[name] , Thank you for taking time to come for your Medicare Wellness Visit. I appreciate your ongoing commitment to your health goals. Please review the following plan we discussed and let me know if I can assist you in the future.   These are the goals we discussed:  Goals       Maintain current health (pt-stated)      Patient Stated      Increase exercise        This is a list of the screening recommended for you and due dates:  Health Maintenance  Topic Date Due   COVID-19 Vaccine (5 - 2023-24 season) 04/01/2022*   Medicare Annual Wellness Visit  03/08/2023   DTaP/Tdap/Td vaccine (3 - Td or Tdap) 09/10/2024   Pneumonia Vaccine  Completed   Flu Shot  Completed   HPV Vaccine  Aged Out   Zoster (Shingles) Vaccine  Discontinued  *Topic was postponed. The date shown is not the original due date.     Next appointment: Follow up in one year for your annual wellness visit.   Preventive Care 8 Years and Older, Male Preventive care refers to lifestyle choices and visits with your health care provider that can promote health and wellness. What does preventive care include? A yearly physical exam. This is also called an annual well check. Dental exams once or twice a year. Routine eye exams. Ask your health care provider how often you should have your eyes checked. Personal lifestyle choices, including: Daily care of your teeth and gums. Regular physical activity. Eating a healthy diet. Avoiding tobacco and drug use. Limiting alcohol use. Practicing safe sex. Taking low doses of aspirin every day. Taking vitamin and mineral supplements as recommended by your health care provider. What happens during an annual well check? The services and screenings done by your health care provider during your annual well check will depend on your age, overall health, lifestyle risk factors, and family history of disease. Counseling  Your health care provider may ask you questions about  your: Alcohol use. Tobacco use. Drug use. Emotional well-being. Home and relationship well-being. Sexual activity. Eating habits. History of falls. Memory and ability to understand (cognition). Work and work Statistician. Screening  You may have the following tests or measurements: Height, weight, and BMI. Blood pressure. Lipid and cholesterol levels. These may be checked every 5 years, or more frequently if you are over 47 years old. Skin check. Lung cancer screening. You may have this screening every year starting at age 46 if you have a 30-pack-year history of smoking and currently smoke or have quit within the past 15 years. Fecal occult blood test (FOBT) of the stool. You may have this test every year starting at age 45. Flexible sigmoidoscopy or colonoscopy. You may have a sigmoidoscopy every 5 years or a colonoscopy every 10 years starting at age 21. Prostate cancer screening. Recommendations will vary depending on your family history and other risks. Hepatitis C blood test. Hepatitis B blood test. Sexually transmitted disease (STD) testing. Diabetes screening. This is done by checking your blood sugar (glucose) after you have not eaten for a while (fasting). You may have this done every 1-3 years. Abdominal aortic aneurysm (AAA) screening. You may need this if you are a current or former smoker. Osteoporosis. You may be screened starting at age 68 if you are at high risk. Talk with your health care provider about your test results, treatment options, and if necessary, the need for more  tests. Vaccines  Your health care provider may recommend certain vaccines, such as: Influenza vaccine. This is recommended every year. Tetanus, diphtheria, and acellular pertussis (Tdap, Td) vaccine. You may need a Td booster every 10 years. Zoster vaccine. You may need this after age 11. Pneumococcal 13-valent conjugate (PCV13) vaccine. One dose is recommended after age 1. Pneumococcal  polysaccharide (PPSV23) vaccine. One dose is recommended after age 90. Talk to your health care provider about which screenings and vaccines you need and how often you need them. This information is not intended to replace advice given to you by your health care provider. Make sure you discuss any questions you have with your health care provider. Document Released: 02/09/2015 Document Revised: 10/03/2015 Document Reviewed: 11/14/2014 Elsevier Interactive Patient Education  2017 [place] Prevention in the Home Falls can cause injuries. They can happen to people of all ages. There are many things you can do to make your home safe and to help prevent falls. What can I do on the outside of my home? Regularly fix the edges of walkways and driveways and fix any cracks. Remove anything that might make you trip as you walk through a door, such as a raised step or threshold. Trim any bushes or trees on the path to your home. Use bright outdoor lighting. Clear any walking paths of anything that might make someone trip, such as rocks or tools. Regularly check to see if handrails are loose or broken. Make sure that both sides of any steps have handrails. Any raised decks and porches should have guardrails on the edges. Have any leaves, snow, or ice cleared regularly. Use sand or salt on walking paths during winter. Clean up any spills in your garage right away. This includes oil or grease spills. What can I do in the bathroom? Use night lights. Install grab bars by the toilet and in the tub and shower. Do not use towel bars as grab bars. Use non-skid mats or decals in the tub or shower. If you need to sit down in the shower, use a plastic, non-slip stool. Keep the floor dry. Clean up any water that spills on the floor as soon as it happens. Remove soap buildup in the tub or shower regularly. Attach bath mats securely with double-sided non-slip rug tape. Do not have throw rugs and other  things on the floor that can make you trip. What can I do in the bedroom? Use night lights. Make sure that you have a light by your bed that is easy to reach. Do not use any sheets or blankets that are too big for your bed. They should not hang down onto the floor. Have a firm chair that has side arms. You can use this for support while you get dressed. Do not have throw rugs and other things on the floor that can make you trip. What can I do in the kitchen? Clean up any spills right away. Avoid walking on wet floors. Keep items that you use a lot in easy-to-reach places. If you need to reach something above you, use a strong step stool that has a grab bar. Keep electrical cords out of the way. Do not use floor polish or wax that makes floors slippery. If you must use wax, use non-skid floor wax. Do not have throw rugs and other things on the floor that can make you trip. What can I do with my stairs? Do not leave any items on the stairs. Make sure  that there are handrails on both sides of the stairs and use them. Fix handrails that are broken or loose. Make sure that handrails are as long as the stairways. Check any carpeting to make sure that it is firmly attached to the stairs. Fix any carpet that is loose or worn. Avoid having throw rugs at the top or bottom of the stairs. If you do have throw rugs, attach them to the floor with carpet tape. Make sure that you have a light switch at the top of the stairs and the bottom of the stairs. If you do not have them, ask someone to add them for you. What else can I do to help prevent falls? Wear shoes that: Do not have high heels. Have rubber bottoms. Are comfortable and fit you well. Are closed at the toe. Do not wear sandals. If you use a stepladder: Make sure that it is fully opened. Do not climb a closed stepladder. Make sure that both sides of the stepladder are locked into place. Ask someone to hold it for you, if possible. Clearly  mark and make sure that you can see: Any grab bars or handrails. First and last steps. Where the edge of each step is. Use tools that help you move around (mobility aids) if they are needed. These include: Canes. Walkers. Scooters. Crutches. Turn on the lights when you go into a dark area. Replace any light bulbs as soon as they burn out. Set up your furniture so you have a clear path. Avoid moving your furniture around. If any of your floors are uneven, fix them. If there are any pets around you, be aware of where they are. Review your medicines with your doctor. Some medicines can make you feel dizzy. This can increase your chance of falling. Ask your doctor what other things that you can do to help prevent falls. This information is not intended to replace advice given to you by your health care provider. Make sure you discuss any questions you have with your health care provider. Document Released: 11/09/2008 Document Revised: 06/21/2015 Document Reviewed: 02/17/2014 Elsevier Interactive Patient Education  2017 [employer].

## 2022-03-31 ENCOUNTER — Ambulatory Visit: Payer: Medicare Other | Admitting: Family Medicine

## 2022-03-31 ENCOUNTER — Encounter: Payer: Self-pay | Admitting: Family Medicine

## 2022-03-31 VITALS — BP 138/80 | HR 56 | Temp 98.0°F | Ht 66.5 in | Wt 183.0 lb

## 2022-03-31 DIAGNOSIS — I10 Essential (primary) hypertension: Secondary | ICD-10-CM | POA: Diagnosis not present

## 2022-03-31 DIAGNOSIS — I2510 Atherosclerotic heart disease of native coronary artery without angina pectoris: Secondary | ICD-10-CM | POA: Diagnosis not present

## 2022-03-31 DIAGNOSIS — L570 Actinic keratosis: Secondary | ICD-10-CM

## 2022-03-31 LAB — COMPREHENSIVE METABOLIC PANEL
ALT: 9 U/L (ref 0–53)
AST: 13 U/L (ref 0–37)
Albumin: 4 g/dL (ref 3.5–5.2)
Alkaline Phosphatase: 65 U/L (ref 39–117)
BUN: 21 mg/dL (ref 6–23)
CO2: 28 mEq/L (ref 19–32)
Calcium: 9.6 mg/dL (ref 8.4–10.5)
Chloride: 105 mEq/L (ref 96–112)
Creatinine, Ser: 1.09 mg/dL (ref 0.40–1.50)
GFR: 61.29 mL/min (ref >60.00)
Glucose, Bld: 89 mg/dL (ref 70–99)
Potassium: 4.6 mEq/L (ref 3.5–5.1)
Sodium: 140 mEq/L (ref 135–145)
Total Bilirubin: 0.8 mg/dL (ref 0.2–1.2)
Total Protein: 6.4 g/dL (ref 6.0–8.3)

## 2022-03-31 LAB — LIPID PANEL
Cholesterol: 105 mg/dL (ref 0–200)
HDL: 38.1 mg/dL — ABNORMAL LOW (ref >39.00)
LDL Cholesterol: 50 mg/dL (ref 0–99)
NonHDL: 67.37
Total CHOL/HDL Ratio: 3
Triglycerides: 88 mg/dL (ref 0.0–149.0)
VLDL: 17.6 mg/dL (ref 0.0–40.0)

## 2022-03-31 NOTE — Patient Instructions (Addendum)
Give [place] 2-3 business days to get the results of your labs back.   Keep the diet clean and stay active.  Let us know if you need anything.  EXERCISES  RANGE OF MOTION (ROM) AND STRETCHING EXERCISES These exercises may help you when beginning to rehabilitate your injury. While completing these exercises, remember:  Restoring tissue flexibility helps normal motion to return to the joints. This allows healthier, less painful movement and activity. An effective stretch should be held for at least 30 seconds. A stretch should never be painful. You should only feel a gentle lengthening or release in the stretched tissue.  ROM - Pendulum Bend at the waist so that your right / left arm falls away from your body. Support yourself with your opposite hand on a solid surface, such as a table or a countertop. Your right / left arm should be perpendicular to the ground. If it is not perpendicular, you need to lean over farther. Relax the muscles in your right / left arm and shoulder as much as possible. Gently sway your hips and trunk so they move your right / left arm without any use of your right / left shoulder muscles. Progress your movements so that your right / left arm moves side to side, then forward and backward, and finally, both clockwise and counterclockwise. Complete 10-15 repetitions in each direction. Many people use this exercise to relieve discomfort in their shoulder as well as to gain range of motion. Repeat 2 times. Complete this exercise 3 times per week.  STRETCH - Flexion, Standing Stand with good posture. With an underhand grip on your right / left hand and an overhand grip on the opposite hand, grasp a broomstick or cane so that your hands are a little more than shoulder-width apart. Keeping your right / left elbow straight and shoulder muscles relaxed, push the stick with your opposite hand to raise your right / left arm in front of your body and then overhead. Raise your arm until  you feel a stretch in your right / left shoulder, but before you have increased shoulder pain. Try to avoid shrugging your right / left shoulder as your arm rises by keeping your shoulder blade tucked down and toward your mid-back spine. Hold 30 seconds. Slowly return to the starting position. Repeat 2 times. Complete this exercise 3 times per week.  STRETCH - Internal Rotation Place your right / left hand behind your back, palm-up. Throw a towel or belt over your opposite shoulder. Grasp the towel/belt with your right / left hand. While keeping an upright posture, gently pull up on the towel/belt until you feel a stretch in the front of your right / left shoulder. Avoid shrugging your right / left shoulder as your arm rises by keeping your shoulder blade tucked down and toward your mid-back spine. Hold 30. Release the stretch by lowering your opposite hand. Repeat 2 times. Complete this exercise 3 times per week.  STRETCH - External Rotation and Abduction Stagger your stance through a doorframe. It does not matter which foot is forward. As instructed by your physician, physical therapist or athletic trainer, place your hands: And forearms above your head and on the door frame. And forearms at head-height and on the door frame. At elbow-height and on the door frame. Keeping your head and chest upright and your stomach muscles tight to prevent over-extending your low-back, slowly shift your weight onto your front foot until you feel a stretch across your chest and/or in  the front of your shoulders. Hold 30 seconds. Shift your weight to your back foot to release the stretch. Repeat 2 times. Complete this stretch 3 times per week.   STRENGTHENING EXERCISES  These exercises may help you when beginning to rehabilitate your injury. They may resolve your symptoms with or without further involvement from your physician, physical therapist or athletic trainer. While completing these exercises,  remember:  Muscles can gain both the endurance and the strength needed for everyday activities through controlled exercises. Complete these exercises as instructed by your physician, physical therapist or athletic trainer. Progress the resistance and repetitions only as guided. You may experience muscle soreness or fatigue, but the pain or discomfort you are trying to eliminate should never worsen during these exercises. If this pain does worsen, stop and make certain you are following the directions exactly. If the pain is still present after adjustments, discontinue the exercise until you can discuss the trouble with your clinician. If advised by your physician, during your recovery, avoid activity or exercises which involve actions that place your right / left hand or elbow above your head or behind your back or head. These positions stress the tissues which are trying to heal.  STRENGTH - Scapular Depression and Adduction With good posture, sit on a firm chair. Supported your arms in front of you with pillows, arm rests or a table top. Have your elbows in line with the sides of your body. Gently draw your shoulder blades down and toward your mid-back spine. Gradually increase the tension without tensing the muscles along the top of your shoulders and the back of your neck. Hold for 3 seconds. Slowly release the tension and relax your muscles completely before completing the next repetition. After you have practiced this exercise, remove the arm support and complete it in standing as well as sitting. Repeat 2 times. Complete this exercise 3 times per week.   STRENGTH - External Rotators Secure a rubber exercise band/tubing to a fixed object so that it is at the same height as your right / left elbow when you are standing or sitting on a firm surface. Stand or sit so that the secured exercise band/tubing is at your side that is not injured. Bend your elbow 90 degrees. Place a folded towel or small  pillow under your right / left arm so that your elbow is a few inches away from your side. Keeping the tension on the exercise band/tubing, pull it away from your body, as if pivoting on your elbow. Be sure to keep your body steady so that the movement is only coming from your shoulder rotating. Hold 3 seconds. Release the tension in a controlled manner as you return to the starting position. Repeat 2 times. Complete this exercise 3 times per week.   STRENGTH - Supraspinatus Stand or sit with good posture. Grasp a 2-3 lb weight or an exercise band/tubing so that your hand is "thumbs-up," like when you shake hands. Slowly lift your right / left hand from your thigh into the air, traveling about 30 degrees from straight out at your side. Lift your hand to shoulder height or as far as you can without increasing any shoulder pain. Initially, many people do not lift their hands above shoulder height. Avoid shrugging your right / left shoulder as your arm rises by keeping your shoulder blade tucked down and toward your mid-back spine. Hold for 3 seconds. Control the descent of your hand as you slowly return to your starting  position. Repeat 2 times. Complete this exercise 3 times per week.   STRENGTH - Shoulder Extensors Secure a rubber exercise band/tubing so that it is at the height of your shoulders when you are either standing or sitting on a firm arm-less chair. With a thumbs-up grip, grasp an end of the band/tubing in each hand. Straighten your elbows and lift your hands straight in front of you at shoulder height. Step back away from the secured end of band/tubing until it becomes tense. Squeezing your shoulder blades together, pull your hands down to the sides of your thighs. Do not allow your hands to go behind you. Hold for 3 seconds. Slowly ease the tension on the band/tubing as you reverse the directions and return to the starting position. Repeat 2 times. Complete this exercise 3 times per  week.   STRENGTH - Scapular Retractors Secure a rubber exercise band/tubing so that it is at the height of your shoulders when you are either standing or sitting on a firm arm-less chair. With a palm-down grip, grasp an end of the band/tubing in each hand. Straighten your elbows and lift your hands straight in front of you at shoulder height. Step back away from the secured end of band/tubing until it becomes tense. Squeezing your shoulder blades together, draw your elbows back as you bend them. Keep your upper arm lifted away from your body throughout the exercise. Hold 3 seconds. Slowly ease the tension on the band/tubing as you reverse the directions and return to the starting position. Repeat 2 times. Complete this exercise 3 times per week.  STRENGTH - Scapular Depressors Find a sturdy chair without wheels, such as a from a dining room table. Keeping your feet on the floor, lift your bottom from the seat and lock your elbows. Keeping your elbows straight, allow gravity to pull your body weight down. Your shoulders will rise toward your ears. Raise your body against gravity by drawing your shoulder blades down your back, shortening the distance between your shoulders and ears. Although your feet should always maintain contact with the floor, your feet should progressively support less body weight as you get stronger. Hold 3 seconds. In a controlled and slow manner, lower your body weight to begin the next repetition. Repeat 2 times. Complete this exercise 3 times per week.    This information is not intended to replace advice given to you by your health care provider. Make sure you discuss any questions you have with your health care provider.   Document Released: 11/27/2004 Document Revised: 02/03/2014 Document Reviewed: 04/27/2008 Elsevier Interactive Patient Education [employer].

## 2022-03-31 NOTE — Progress Notes (Signed)
Chief Complaint  Patient presents with   Follow-up    6 month     Subjective Robert Pacheco is a 89 y.o. male who presents for hypertension follow up. He does monitor home blood pressures. Blood pressures ranging from 120's/60's on average. He is compliant with medications- Toprol XL 12.5 mg/d. Patient has these side effects of medication: none He is adhering to a healthy diet overall. Current exercise: none No CP or SOB.   Hyperlipidemia Patient presents for hyperlipidemia follow up. Currently being treated with Zocor 20 mg/d and compliance with treatment thus far has been good. He denies myalgias. Diet/exercise as above.  The patient is known to have coexisting coronary artery disease.  The patient has had 2 spots in his left ear for several weeks.  He does follow with dermatology routinely but has not seen them for this.  There is no pain, drainage, bleeding, or inciting trauma/event.  He has not tried anything at home.   Past Medical History:  Diagnosis Date   Allergy    Coronary artery disease    Hyperlipidemia    Hypertension    Personal history of colonic adenomas 05/10/2012   Skin cancer, basal cell     Exam BP 138/80 (BP Location: Left Arm, Patient Position: Sitting, Cuff Size: Normal)   Pulse (!) 56   Temp 98 F (36.7 C) (Oral)   Ht 5' 6.5" (1.689 m)   Wt 183 lb (83 kg)   SpO2 98%   BMI 29.09 kg/m  General:  well developed, well nourished, in no apparent distress Heart: Regular rhythm, bradycardic, no bruits, 1+ pitting bilateral LE edema tapering at the proximal third of the tibia Lungs: clear to auscultation, no accessory muscle use Skin: Scaly macules on external L ear without erythema, excessive warmth, drainage, or fluctuance Psych: well oriented with normal range of affect and appropriate judgment/insight  Procedure note: cryotherapy Verbal consent obtained 3 skin lesions treated Liquid nitrogen was applied via a thin spray creating an ice ball  with 1-2 mm corona surrounding the lesion The patient tolerated the procedure well There were no immediate complications noted  Essential hypertension  Coronary artery disease involving native coronary artery of native heart without angina pectoris - Plan: Comprehensive metabolic panel, Lipid panel  AK (actinic keratosis) - Plan: PR DESTRUCTION PREMALIGNANT LESION 1ST, PR DESTRUCTION PREMALIGNANT LESION 2-14 EA  Chronic, stable. Cont Toprol XL 25 mg/d. Counseled on diet and exercise. Chronic, stable.  Continue Zocor 20 mg daily. Cryotherapy with liquid nitrogen today.  This has worked well historically for these ear lesions that pop up from time to time.  Follow-up with dermatology if no better. F/u in 6 mo. The patient voiced understanding and agreement to the plan.  [place], DO 03/31/22  10:05 AM

## 2022-05-14 ENCOUNTER — Encounter: Payer: Self-pay | Admitting: *Deleted

## 2022-05-26 ENCOUNTER — Encounter (HOSPITAL_BASED_OUTPATIENT_CLINIC_OR_DEPARTMENT_OTHER): Payer: Self-pay | Admitting: Emergency Medicine

## 2022-05-26 ENCOUNTER — Emergency Department (HOSPITAL_BASED_OUTPATIENT_CLINIC_OR_DEPARTMENT_OTHER)
Admission: EM | Admit: 2022-05-26 | Discharge: 2022-05-26 | Disposition: A | Discharge status: Home / Self Care | Payer: Medicare Other | Attending: Emergency Medicine | Admitting: Emergency Medicine

## 2022-05-26 ENCOUNTER — Telehealth: Payer: Self-pay | Admitting: Family Medicine

## 2022-05-26 ENCOUNTER — Emergency Department (HOSPITAL_BASED_OUTPATIENT_CLINIC_OR_DEPARTMENT_OTHER): Payer: Medicare Other

## 2022-05-26 ENCOUNTER — Other Ambulatory Visit: Payer: Self-pay

## 2022-05-26 DIAGNOSIS — W010XXA Fall on same level from slipping, tripping and stumbling without subsequent striking against object, initial encounter: Secondary | ICD-10-CM | POA: Diagnosis not present

## 2022-05-26 DIAGNOSIS — S60222A Contusion of left hand, initial encounter: Secondary | ICD-10-CM | POA: Insufficient documentation

## 2022-05-26 DIAGNOSIS — W19XXXA Unspecified fall, initial encounter: Secondary | ICD-10-CM

## 2022-05-26 DIAGNOSIS — S4992XA Unspecified injury of left shoulder and upper arm, initial encounter: Secondary | ICD-10-CM

## 2022-05-26 DIAGNOSIS — R0789 Other chest pain: Secondary | ICD-10-CM | POA: Diagnosis not present

## 2022-05-26 DIAGNOSIS — Z7982 Long term (current) use of aspirin: Secondary | ICD-10-CM | POA: Insufficient documentation

## 2022-05-26 DIAGNOSIS — S60212A Contusion of left wrist, initial encounter: Secondary | ICD-10-CM | POA: Insufficient documentation

## 2022-05-26 NOTE — Telephone Encounter (Signed)
Patient went to the ED today. 

## 2022-05-26 NOTE — Telephone Encounter (Signed)
Initial Comment Caller states he has a fall this past Saturday and has bruised his arm. He states his arm is still swollen. Translation No Disp. Time Lamount Cohen Time) Disposition Final User 05/26/2022 12:25:41 PM Attempt made - message left Cox, RN, [facility] 05/26/2022 12:31:33 PM Attempt made - no message left Cox, RN, [facility] 05/26/2022 1:03:23 PM FINAL ATTEMPT MADE - message left Yes Cox, RN, [facility] Final Disposition 05/26/2022 1:03:23 PM FINAL ATTEMPT MADE - message left Yes Cox, RN, [employer]

## 2022-05-26 NOTE — Discharge Instructions (Signed)
You were seen for your arm injury in the emergency department.   At home, please elevate your arm and ice it when possible.    Follow-up with your primary doctor in 2-3 days regarding your visit.    Return immediately to the emergency department if you experience any of the following: Severe pain, or any other concerning symptoms.    Thank you for visiting our Emergency Department. It was a pleasure taking care of you today.

## 2022-05-26 NOTE — Telephone Encounter (Signed)
FYI: This call has been transferred to Access Nurse. Once the result note has been entered staff can address the message at that time.  Patient called in with the following symptoms: Pt fell Saturday but caught himself so his arm hit really hard. He did not hit his head or black out. Pain has somewhat subsided but it's still swollen.   Red Word:fall    Please advise at Mobile [phone] (mobile)  Message is routed to Provider Pool and [facility] Triage

## 2022-05-26 NOTE — ED Triage Notes (Signed)
Fell 2 days ago , left arm hand injury , obvious swelling to left hand and wrist , surgery on same limb years ago he said , obvious scar .  On baby aspirin daily , no other blood thinners , no loc , no head or neck injury .

## 2022-05-26 NOTE — ED Provider Notes (Signed)
[place] [facility] Provider Note   CSN: [id number] Arrival date & time: 05/26/22  1240     History  Chief Complaint  Patient presents with   Fall   Arm Injury    left    Robert Pacheco is a 89 y.o. male.  89 year old male who presents to the emergency department with left arm and wrist pain.  Reports that he tripped over a curb and fell onto his left upper extremity and his forearm hit the ground.  No head strike or LOC.  No preceding symptoms.  Says that he has had a significant amount of swelling to his forearm and hand since then.  Says that he has been icing it but the swelling has worsened so he decided to come in to the emergency department today to make sure he did not have a broken arm.       Home Medications Prior to Admission medications   Medication Sig Start Date End Date Taking? Authorizing Provider  aspirin 81 MG chewable tablet Chew 1 tablet (81 mg total) by mouth daily. 09/10/20   Tobb, Kardie, DO  bisacodyl (DULCOLAX) 5 MG EC tablet Take 5 mg by mouth daily as needed for moderate constipation.    [provider]  furosemide (LASIX) 20 MG tablet Take 20 mg (one tablet) on Tuesday, Thursday and Saturday 10/01/21   Sharlene Dory, DO  ketoconazole (NIZORAL) 2 % cream APPLY TOPICALLY TWICE DAILY AS NEEDED FOR IRRITATION. 10/01/21   Wendling, Jilda Roche, DO  metoprolol succinate (TOPROL XL) 25 MG 24 hr tablet Take 0.5 tablets (12.5 mg total) by mouth daily. 10/01/21   Wendling, Jilda Roche, DO  NITROSTAT 0.4 MG SL tablet DISSOLVE ONE TABLET UNDER THE TONGUE EVERY 5 MINUTES AS NEEDED. 10/01/21   Sharlene Dory, DO  Omega-3 Fatty Acids (FISH OIL) 1000 MG CAPS Take 2 capsules by mouth daily at 12 noon.    [provider]  potassium chloride (KLOR-CON) 10 MEQ tablet Take 10 meq (one tablet) on Tuesday, Thursday and Saturday 10/01/21   Sharlene Dory, DO  simvastatin (ZOCOR) 20 MG tablet Take 1  tablet (20 mg total) by mouth daily at 6 PM. 02/05/22   Wendling, Jilda Roche, DO      Allergies    Other    Review of Systems   Review of Systems  Physical Exam Updated Vital Signs BP (!) 166/64 (BP Location: Right Arm)   Pulse 64   Temp 97.6 F (36.4 C) (Oral)   Resp 20   Wt 81.6 kg   SpO2 99%   BMI 28.62 kg/m  Physical Exam Vitals and nursing note reviewed.  Constitutional:      General: He is not in acute distress.    Appearance: He is well-developed.  HENT:     Head: Normocephalic and atraumatic.     Right Ear: External ear normal.     Left Ear: External ear normal.     Nose: Nose normal.  Eyes:     Extraocular Movements: Extraocular movements intact.     Conjunctiva/sclera: Conjunctivae normal.     Pupils: Pupils are equal, round, and reactive to light.  Cardiovascular:     Rate and Rhythm: Normal rate and regular rhythm.     Heart sounds: Normal heart sounds.     Comments: Chest wall pain reproducible.  No obvious deformities noted or bruising. Pulmonary:     Effort: Pulmonary effort is normal. No respiratory distress.  Breath sounds: Normal breath sounds.  Abdominal:     Palpations: Abdomen is soft.  Musculoskeletal:     Cervical back: Normal range of motion and neck supple.     Comments: All compartments of the left upper extremity are soft  Significant bruising of the left upper extremity to the hand and wrist with swelling present.  No point tenderness to palpation of the upper extremity.  Symmetrically palpable radial and ulnar pulses. Capillary refill <2 seconds to all digits.  Intact sensation to light touch of the radial, median and ulnar nerves demonstrated by testing in the dorsal web space of the thumb, the hypothenar eminence of the palm, and the radial aspect of the dorsum of the hand.  Intact motor function of the radial, median and ulnar nerves demonstrated by strength of hand grip, and spreading of the 2nd through 5th digits, thumb  apposition, and ability to make "OK sign".  No snuffbox tenderness to palpation of the left upper extremity  Skin:    General: Skin is warm and dry.  Neurological:     Mental Status: He is alert. Mental status is at baseline.  Psychiatric:        Mood and Affect: Mood normal.        Behavior: Behavior normal.     ED Results / Procedures / Treatments   Labs (all labs ordered are listed, but only abnormal results are displayed) Labs Reviewed - No data to display  EKG None  Radiology DG Hand Complete Left  Result Date: 05/26/2022 CLINICAL DATA:  Larey Seat 2 days ago on sidewalk. Tried to catch himself. Mid left hand to mid left forearm pain and swelling. EXAM: LEFT WRIST - COMPLETE 3+ VIEW; LEFT HAND - COMPLETE 3+ VIEW COMPARISON:  None Available. FINDINGS: Left wrist: There are numerous surgical clips seen within the volar radial aspect of the distal forearm. Severe thumb carpometacarpal joint space narrowing, subchondral sclerosis/cystic change, and peripheral osteophytosis. Mild degenerative spurring at the lateral/radial base of the second metacarpal. Mild triscaphe peripheral degenerative osteophytes. Type 2 lunate with articulation with the hamate. No definite acute fracture is seen. No dislocation. Left hand: Mild-to-moderate thumb interphalangeal and second through fifth DIP joint space narrowing and peripheral osteophytosis. Similar third PIP osteoarthritis. Mild thumb metacarpophalangeal joint space narrowing and peripheral spurring. No acute fracture or dislocation. IMPRESSION: 1. No definite acute fracture. 2. Severe thumb carpometacarpal osteoarthritis. 3. Mild-to-moderate thumb interphalangeal and second through fifth DIP osteoarthritis. Electronically Signed   By: Neita Garnet M.D.   On: 05/26/2022 13:16   DG Wrist Complete Left  Result Date: 05/26/2022 CLINICAL DATA:  Larey Seat 2 days ago on sidewalk. Tried to catch himself. Mid left hand to mid left forearm pain and swelling. EXAM:  LEFT WRIST - COMPLETE 3+ VIEW; LEFT HAND - COMPLETE 3+ VIEW COMPARISON:  None Available. FINDINGS: Left wrist: There are numerous surgical clips seen within the volar radial aspect of the distal forearm. Severe thumb carpometacarpal joint space narrowing, subchondral sclerosis/cystic change, and peripheral osteophytosis. Mild degenerative spurring at the lateral/radial base of the second metacarpal. Mild triscaphe peripheral degenerative osteophytes. Type 2 lunate with articulation with the hamate. No definite acute fracture is seen. No dislocation. Left hand: Mild-to-moderate thumb interphalangeal and second through fifth DIP joint space narrowing and peripheral osteophytosis. Similar third PIP osteoarthritis. Mild thumb metacarpophalangeal joint space narrowing and peripheral spurring. No acute fracture or dislocation. IMPRESSION: 1. No definite acute fracture. 2. Severe thumb carpometacarpal osteoarthritis. 3. Mild-to-moderate thumb interphalangeal and second through  fifth DIP osteoarthritis. Electronically Signed   By: Neita Garnet M.D.   On: 05/26/2022 13:16    Procedures Procedures    Medications Ordered in ED Medications - No data to display  ED Course/ Medical Decision Making/ A&P                             Medical Decision Making Amount and/or Complexity of Data Reviewed Radiology: ordered.   Robert Pacheco is a 89 y.o. male who presents to the emergency department with left upper extremity pain and swelling after a fall  Initial Ddx:  Left wrist fracture, left carpal fracture, bruising, compartment syndrome  MDM:  Obtain x-rays to rule out hand and wrist fracture.  All compartments are soft and feel like compartment syndrome is unlikely.  The likely has some dependent edema from hematomas as a result of this fall.  Plan:  X-ray hand and wrist  ED Summary/Re-evaluation:  No fractures are noted on the patient's x-rays.  Will have him follow-up with his primary doctor and  continue supportive care for his posttraumatic swelling.  This patient presents to the ED for concern of complaints listed in HPI, this involves an extensive number of treatment options, and is a complaint that carries with it a high risk of complications and morbidity. Disposition including potential need for admission considered.   Dispo: DC Home. Return precautions discussed including, but not limited to, those listed in the AVS. Allowed pt time to ask questions which were answered fully prior to dc.  Additional history obtained from daughter Records reviewed Outpatient Clinic Notes I independently reviewed the following imaging with scope of interpretation limited to determining acute life threatening conditions related to emergency care: Extremity x-ray(s) and agree with the radiologist interpretation with the following exceptions: none I have reviewed the patients home medications and made adjustments as needed Social Determinants of health:  Elderly  Final Clinical Impression(s) / ED Diagnoses Final diagnoses:  Fall, initial encounter  Injury of left upper extremity, initial encounter    Rx / DC Orders ED Discharge Orders     None         Rondel Baton, MD 05/26/22 [phone]

## 2022-05-26 NOTE — ED Notes (Signed)
Discharge instructions reviewed with patient. Patient verbalizes understanding, no further questions at this time. Medications and follow up information provided. No acute distress noted at time of departure.  

## 2022-08-07 ENCOUNTER — Emergency Department (HOSPITAL_BASED_OUTPATIENT_CLINIC_OR_DEPARTMENT_OTHER)
Admission: EM | Admit: 2022-08-07 | Discharge: 2022-08-07 | Disposition: A | Discharge status: Home / Self Care | Payer: Medicare Other | Attending: Emergency Medicine | Admitting: Emergency Medicine

## 2022-08-07 ENCOUNTER — Encounter (HOSPITAL_BASED_OUTPATIENT_CLINIC_OR_DEPARTMENT_OTHER): Payer: Self-pay | Admitting: Emergency Medicine

## 2022-08-07 ENCOUNTER — Other Ambulatory Visit: Payer: Self-pay

## 2022-08-07 DIAGNOSIS — Z7982 Long term (current) use of aspirin: Secondary | ICD-10-CM | POA: Diagnosis not present

## 2022-08-07 DIAGNOSIS — L03116 Cellulitis of left lower limb: Secondary | ICD-10-CM | POA: Insufficient documentation

## 2022-08-07 DIAGNOSIS — M7989 Other specified soft tissue disorders: Secondary | ICD-10-CM | POA: Diagnosis present

## 2022-08-07 LAB — CBC WITH DIFFERENTIAL/PLATELET
Abs Immature Granulocytes: 0.05 10*3/uL (ref 0.00–0.07)
Basophils Absolute: 0 10*3/uL (ref 0.0–0.1)
Basophils Relative: 0 %
Eosinophils Absolute: 0.1 10*3/uL (ref 0.0–0.5)
Eosinophils Relative: 1 %
HCT: 40.7 % (ref 39.0–52.0)
Hemoglobin: 13.9 g/dL (ref 13.0–17.0)
Immature Granulocytes: 1 %
Lymphocytes Relative: 18 %
Lymphs Abs: 1.2 10*3/uL (ref 0.7–4.0)
MCH: 31.1 pg (ref 26.0–34.0)
MCHC: 34.2 g/dL (ref 30.0–36.0)
MCV: 91.1 fL (ref 80.0–100.0)
Monocytes Absolute: 0.6 10*3/uL (ref 0.1–1.0)
Monocytes Relative: 9 %
Neutro Abs: 4.9 10*3/uL (ref 1.7–7.7)
Neutrophils Relative %: 71 %
Platelets: 219 10*3/uL (ref 150–400)
RBC: 4.47 MIL/uL (ref 4.22–5.81)
RDW: 12.7 % (ref 11.5–15.5)
WBC: 6.9 10*3/uL (ref 4.0–10.5)
nRBC: 0 % (ref 0.0–0.2)

## 2022-08-07 LAB — COMPREHENSIVE METABOLIC PANEL
ALT: 12 U/L (ref 0–44)
AST: 18 U/L (ref 15–41)
Albumin: 3.8 g/dL (ref 3.5–5.0)
Alkaline Phosphatase: 57 U/L (ref 38–126)
Anion gap: 11 (ref 5–15)
BUN: 20 mg/dL (ref 8–23)
CO2: 22 mmol/L (ref 22–32)
Calcium: 8.9 mg/dL (ref 8.9–10.3)
Chloride: 105 mmol/L (ref 98–111)
Creatinine, Ser: 1.21 mg/dL (ref 0.61–1.24)
GFR, Estimated: 58 mL/min — ABNORMAL LOW (ref >60)
Glucose, Bld: 145 mg/dL — ABNORMAL HIGH (ref 70–99)
Potassium: 4 mmol/L (ref 3.5–5.1)
Sodium: 138 mmol/L (ref 135–145)
Total Bilirubin: 0.7 mg/dL (ref 0.3–1.2)
Total Protein: 7 g/dL (ref 6.5–8.1)

## 2022-08-07 LAB — LACTIC ACID, PLASMA: Lactic Acid, Venous: 1.1 mmol/L (ref 0.5–1.9)

## 2022-08-07 MED ORDER — CEPHALEXIN 250 MG PO CAPS
500.0000 mg | ORAL_CAPSULE | Freq: Once | ORAL | Status: AC
  Administered 2022-08-07: 500 mg via ORAL
  Filled 2022-08-07: qty 2

## 2022-08-07 MED ORDER — CEPHALEXIN 500 MG PO CAPS: 500.0000 mg | ORAL_CAPSULE | Freq: Four times a day (QID) | ORAL | 0 refills | Status: DC

## 2022-08-07 NOTE — ED Triage Notes (Signed)
Patient from home with complaints of L leg redness to the lower left leg for a week now after accidentally puncturing it with chicken wire. Was not seen initially. Stated initially there was clear drainage but that stopped. The redness and swelling started 3 days ago. Is not diabetic. Ambulatory.

## 2022-08-07 NOTE — ED Provider Notes (Incomplete)
[facility] Provider Note   CSN: [id number] Arrival date & time: 08/07/22  1947     History {Add pertinent medical, surgical, social history, OB history to HPI:1} Chief Complaint  Patient presents with  . Wound Infection    Robert Pacheco is a 89 y.o. male.  HPI     Home Medications Prior to Admission medications   Medication Sig Start Date End Date Taking? Authorizing Provider  aspirin 81 MG chewable tablet Chew 1 tablet (81 mg total) by mouth daily. 09/10/20   Tobb, Kardie, DO  bisacodyl (DULCOLAX) 5 MG EC tablet Take 5 mg by mouth daily as needed for moderate constipation.    [provider]  furosemide (LASIX) 20 MG tablet Take 20 mg (one tablet) on Tuesday, Thursday and Saturday 10/01/21   Sharlene Dory, DO  ketoconazole (NIZORAL) 2 % cream APPLY TOPICALLY TWICE DAILY AS NEEDED FOR IRRITATION. 10/01/21   Wendling, Jilda Roche, DO  metoprolol succinate (TOPROL XL) 25 MG 24 hr tablet Take 0.5 tablets (12.5 mg total) by mouth daily. 10/01/21   Wendling, Jilda Roche, DO  NITROSTAT 0.4 MG SL tablet DISSOLVE ONE TABLET UNDER THE TONGUE EVERY 5 MINUTES AS NEEDED. 10/01/21   Sharlene Dory, DO  Omega-3 Fatty Acids (FISH OIL) 1000 MG CAPS Take 2 capsules by mouth daily at 12 noon.    [provider]  potassium chloride (KLOR-CON) 10 MEQ tablet Take 10 meq (one tablet) on Tuesday, Thursday and Saturday 10/01/21   Sharlene Dory, DO  simvastatin (ZOCOR) 20 MG tablet Take 1 tablet (20 mg total) by mouth daily at 6 PM. 02/05/22   Wendling, Jilda Roche, DO      Allergies    Other    Review of Systems   Review of Systems  Physical Exam Updated Vital Signs BP (!) 187/80 (BP Location: Right Arm)   Pulse 74   Temp 98.2 F (36.8 C) (Oral)   Resp 16   Ht 5\' 6"  (1.676 m)   Wt 81 kg   SpO2 99%   BMI 28.82 kg/m  Physical Exam  ED Results / Procedures / Treatments   Labs (all labs ordered are  listed, but only abnormal results are displayed) Labs Reviewed  COMPREHENSIVE METABOLIC PANEL - Abnormal; Notable for the following components:      Result Value   Glucose, Bld 145 (*)    GFR, Estimated 58 (*)    All other components within normal limits  LACTIC ACID, PLASMA  CBC WITH DIFFERENTIAL/PLATELET  LACTIC ACID, PLASMA    EKG None  Radiology No results found.  Procedures Procedures  {Document cardiac monitor, telemetry assessment procedure when appropriate:1}  Medications Ordered in ED Medications - No data to display  ED Course/ Medical Decision Making/ A&P   {   Click here for ABCD2, HEART and other calculatorsREFRESH Note before signing :1}                          Medical Decision Making Amount and/or Complexity of Data Reviewed Labs: ordered.   ***  {Document critical care time when appropriate:1} {Document review of labs and clinical decision tools ie heart score, Chads2Vasc2 etc:1}  {Document your independent review of radiology images, and any outside records:1} {Document your discussion with family members, caretakers, and with consultants:1} {Document social determinants of health affecting pt's care:1} {Document your decision making why or why not admission, treatments were needed:1}  Final Clinical Impression(s) / ED Diagnoses Final diagnoses:  None    Rx / DC Orders ED Discharge Orders     None

## 2022-08-07 NOTE — Discharge Instructions (Signed)
1.  Try to elevate your leg is much as possible.  You may continue to walk around and do usual activities but do not stand for prolonged periods and do not let your leg dangle when you are sitting. 2.  Take the Keflex 4 times a day as prescribed. 3.  You should have a recheck on the leg within the next 24 to 48 hours. 4.  Return to emergency department immediately if you develop a fever, redness is quickly expanding, you feel confused or weak or nauseated or other concerning changes.

## 2022-08-08 ENCOUNTER — Encounter: Payer: Self-pay | Admitting: Family Medicine

## 2022-08-08 ENCOUNTER — Ambulatory Visit: Payer: Medicare Other | Admitting: Family Medicine

## 2022-08-08 VITALS — BP 131/68 | HR 65 | Temp 97.9°F | Ht 66.5 in | Wt 183.2 lb

## 2022-08-08 DIAGNOSIS — L03116 Cellulitis of left lower limb: Secondary | ICD-10-CM | POA: Diagnosis not present

## 2022-08-08 NOTE — Progress Notes (Signed)
Chief Complaint  Patient presents with   Cellulitis    Robert Pacheco is a 89 y.o. male here for a skin complaint.  Had stuck some wire in his lower leg Duration: 1 week; getting worse Location: L lower extremity Pruritic? No Painful? Yes, only to touch Drainage? No New soaps/lotions/topicals/detergents? No Sick contacts? No Other associated symptoms: redness, swelling No fevers Therapies tried thus far: Keflex, TAO  Past Medical History:  Diagnosis Date   Allergy    Coronary artery disease    Hyperlipidemia    Hypertension    Personal history of colonic adenomas 05/10/2012   Skin cancer, basal cell     BP 131/68 (BP Location: Left Arm, Patient Position: Sitting, Cuff Size: Normal)   Pulse 65   Temp 97.9 F (36.6 C) (Oral)   Ht 5' 6.5" (1.689 m)   Wt 183 lb 4 oz (83.1 kg)   SpO2 99%   BMI 29.13 kg/m  Gen: awake, alert, appearing stated age Lungs: No accessory muscle use Skin: See below. +warmth, mild ttp. No drainage, fluctuance, excoriation Psych: Age appropriate judgment and insight   LLE  Cellulitis of left anterior lower leg  He will start taking Keflex 500 mg qid for 7 d, will have him take his Lasix daily for the next week. Elevate legs. Not having too much pain.  F/u prn. The patient voiced understanding and agreement to the plan.  [name], DO 08/08/22 11:58 AM

## 2022-08-08 NOTE — Patient Instructions (Signed)
Take the furosemide 20 mg daily for the next 7 days and then go back to 3 times weekly.   Take the full amount of the Keflex (antibiotic sent in by ER).  For the swelling in your lower extremities, be sure to elevate your legs when able, mind the salt intake, stay physically active and consider wearing compression stockings.  Let us know if you need anything.

## 2022-08-15 ENCOUNTER — Telehealth: Payer: Self-pay | Admitting: Family Medicine

## 2022-08-15 NOTE — Telephone Encounter (Signed)
Not much pain and redness way better. He has tablets for one more day. He is going out of town on Sunday and was afraid being out of town then need something. Ok to call back with Provider response/can leave a message.

## 2022-08-15 NOTE — Telephone Encounter (Signed)
Patient called and needs a med refill cephALEXin (KEFLEX) 500 MG capsule  please send to [facility] in [place].

## 2022-08-15 NOTE — Telephone Encounter (Signed)
Is his pain and redness still there?

## 2022-08-15 NOTE — Telephone Encounter (Signed)
Called and left a detailed message of PCP response/instructions. Was ok per patient to leave a message.

## 2022-08-15 NOTE — ED Provider Notes (Signed)
[place] [facility] Provider Note   CSN: [id number] Arrival date & time: 08/07/22  1947     History  Chief Complaint  Patient presents with   Wound Infection    Robert Pacheco is a 89 y.o. male.  HPI  Patient from home with complaints of L leg redness to the lower left leg for a week now after accidentally puncturing it with chicken wire. Was not seen initially. Stated initially there was clear drainage but that stopped. The redness and swelling started 3 days ago. Is not diabetic. Ambulatory.     Patient reports sig worsening redness and swelling past few days. No fever, malaise or confusion. Otherwise feels well at baseline.    Home Medications Prior to Admission medications   Medication Sig Start Date End Date Taking? Authorizing Provider  cephALEXin (KEFLEX) 500 MG capsule Take 1 capsule (500 mg total) by mouth 4 (four) times daily. 08/07/22  Yes Arby Barrette, MD  aspirin 81 MG chewable tablet Chew 1 tablet (81 mg total) by mouth daily. 09/10/20   Tobb, Kardie, DO  bisacodyl (DULCOLAX) 5 MG EC tablet Take 5 mg by mouth daily as needed for moderate constipation.    [provider]  furosemide (LASIX) 20 MG tablet Take 20 mg (one tablet) on Tuesday, Thursday and Saturday 10/01/21   Sharlene Dory, DO  ketoconazole (NIZORAL) 2 % cream APPLY TOPICALLY TWICE DAILY AS NEEDED FOR IRRITATION. 10/01/21   Wendling, Jilda Roche, DO  metoprolol succinate (TOPROL XL) 25 MG 24 hr tablet Take 0.5 tablets (12.5 mg total) by mouth daily. 10/01/21   Wendling, Jilda Roche, DO  NITROSTAT 0.4 MG SL tablet DISSOLVE ONE TABLET UNDER THE TONGUE EVERY 5 MINUTES AS NEEDED. 10/01/21   Sharlene Dory, DO  Omega-3 Fatty Acids (FISH OIL) 1000 MG CAPS Take 2 capsules by mouth daily at 12 noon.    [provider]  potassium chloride (KLOR-CON) 10 MEQ tablet Take 10 meq (one tablet) on Tuesday, Thursday and Saturday 10/01/21   Sharlene Dory, DO  simvastatin (ZOCOR) 20 MG tablet Take 1 tablet (20 mg total) by mouth daily at 6 PM. 02/05/22   Wendling, Jilda Roche, DO      Allergies    Other    Review of Systems   Review of Systems  Physical Exam Updated Vital Signs BP (!) 171/79   Pulse 78   Temp 98.2 F (36.8 C) (Oral)   Resp 18   Ht 5\' 6"  (1.676 m)   Wt 81 kg   SpO2 99%   BMI 28.82 kg/m  Physical Exam Constitutional:      Appearance: Normal appearance.  HENT:     Mouth/Throat:     Pharynx: Oropharynx is clear.  Eyes:     Extraocular Movements: Extraocular movements intact.  Cardiovascular:     Rate and Rhythm: Normal rate and regular rhythm.  Pulmonary:     Effort: Pulmonary effort is normal.     Breath sounds: Normal breath sounds.  Musculoskeletal:        General: Swelling and tenderness present.  Skin:    General: Skin is warm and dry.  Neurological:     General: No focal deficit present.     Mental Status: He is alert and oriented to person, place, and time.     Coordination: Coordination normal.  Psychiatric:        Mood and Affect: Mood normal.     ED Results /  Procedures / Treatments   Labs (all labs ordered are listed, but only abnormal results are displayed) Labs Reviewed  COMPREHENSIVE METABOLIC PANEL - Abnormal; Notable for the following components:      Result Value   Glucose, Bld 145 (*)    GFR, Estimated 58 (*)    All other components within normal limits  LACTIC ACID, PLASMA  CBC WITH DIFFERENTIAL/PLATELET    EKG None  Radiology No results found.  Procedures Procedures    Medications Ordered in ED Medications  cephALEXin (KEFLEX) capsule 500 mg (500 mg Oral Given 08/07/22 2252)    ED Course/ Medical Decision Making/ A&P                             Medical Decision Making Amount and/or Complexity of Data Reviewed Labs: ordered.  Risk Prescription drug management.   Presents with minor LE injury which converted to cellulitis in past week.  Clinically well but with age and extent of cellulitis will obtain basic labs and treat with antibiotics.  CBC normal with normal differential. Lactic 1.1 normal metabolic panel.  With patient clinically well and normal labs, OK to start outpatient antibiotics.  Reviewed strict return precautions and follow up.        Final Clinical Impression(s) / ED Diagnoses Final diagnoses:  Cellulitis of left lower extremity    Rx / DC Orders ED Discharge Orders          Ordered    cephALEXin (KEFLEX) 500 MG capsule  4 times daily        08/07/22 2321              Arby Barrette, MD 08/15/22 1327

## 2022-10-07 ENCOUNTER — Ambulatory Visit: Payer: Medicare Other | Admitting: Family Medicine

## 2022-10-07 ENCOUNTER — Encounter: Payer: Self-pay | Admitting: Family Medicine

## 2022-10-07 VITALS — BP 112/68 | HR 63 | Temp 98.0°F | Ht 67.0 in | Wt 182.5 lb

## 2022-10-07 DIAGNOSIS — I10 Essential (primary) hypertension: Secondary | ICD-10-CM | POA: Diagnosis not present

## 2022-10-07 DIAGNOSIS — Z0000 Encounter for general adult medical examination without abnormal findings: Secondary | ICD-10-CM | POA: Diagnosis not present

## 2022-10-07 DIAGNOSIS — Z23 Encounter for immunization: Secondary | ICD-10-CM

## 2022-10-07 LAB — COMPREHENSIVE METABOLIC PANEL
ALT: 10 U/L (ref 0–53)
AST: 17 U/L (ref 0–37)
Albumin: 4 g/dL (ref 3.5–5.2)
Alkaline Phosphatase: 72 U/L (ref 39–117)
BUN: 16 mg/dL (ref 6–23)
CO2: 26 meq/L (ref 19–32)
Calcium: 9.3 mg/dL (ref 8.4–10.5)
Chloride: 108 meq/L (ref 96–112)
Creatinine, Ser: 0.98 mg/dL (ref 0.40–1.50)
GFR: 69.38 mL/min (ref >60.00)
Glucose, Bld: 91 mg/dL (ref 70–99)
Potassium: 4.3 meq/L (ref 3.5–5.1)
Sodium: 142 meq/L (ref 135–145)
Total Bilirubin: 0.7 mg/dL (ref 0.2–1.2)
Total Protein: 6.7 g/dL (ref 6.0–8.3)

## 2022-10-07 LAB — LIPID PANEL
Cholesterol: 103 mg/dL (ref 0–200)
HDL: 40.5 mg/dL (ref >39.00)
LDL Cholesterol: 48 mg/dL (ref 0–99)
NonHDL: 62.77
Total CHOL/HDL Ratio: 3
Triglycerides: 76 mg/dL (ref 0.0–149.0)
VLDL: 15.2 mg/dL (ref 0.0–40.0)

## 2022-10-07 LAB — CBC
HCT: 43.4 % (ref 39.0–52.0)
Hemoglobin: 14.4 g/dL (ref 13.0–17.0)
MCHC: 33.3 g/dL (ref 30.0–36.0)
MCV: 93 fl (ref 78.0–100.0)
Platelets: 270 10*3/uL (ref 150.0–400.0)
RBC: 4.67 Mil/uL (ref 4.22–5.81)
RDW: 13.4 % (ref 11.5–15.5)
WBC: 5 10*3/uL (ref 4.0–10.5)

## 2022-10-07 NOTE — Progress Notes (Signed)
Chief Complaint  Patient presents with   Annual Exam    Check head injury from his garage door    Well Male Robert Pacheco is here for a complete physical.   His last physical was >1 year ago.  Current diet: in general, a "healthy" diet.   Current exercise: active in yard Weight trend: stable Fatigue out of ordinary? No. Seat belt? Yes.   Advanced directive? No  Health maintenance Shingrix- No Tetanus- Yes Pneumonia vaccine- Yes  Hit head w garage door. Bled. Did not lose consciousness. No fevers, spreading redness.   Past Medical History:  Diagnosis Date   Allergy    Coronary artery disease    Hyperlipidemia    Hypertension    Personal history of colonic adenomas 05/10/2012   Skin cancer, basal cell      Past Surgical History:  Procedure Laterality Date   CATARACT EXTRACTION     COLONOSCOPY  multiple   CORONARY ARTERY BYPASS GRAFT  2000   EYE SURGERY     HERNIA REPAIR     MOHS SURGERY      Medications  Current Outpatient Medications on File Prior to Visit  Medication Sig Dispense Refill   aspirin 81 MG chewable tablet Chew 1 tablet (81 mg total) by mouth daily. 90 tablet 3   bisacodyl (DULCOLAX) 5 MG EC tablet Take 5 mg by mouth daily as needed for moderate constipation.     cephALEXin (KEFLEX) 500 MG capsule Take 1 capsule (500 mg total) by mouth 4 (four) times daily. 28 capsule 0   furosemide (LASIX) 20 MG tablet Take 20 mg (one tablet) on Tuesday, Thursday and Saturday 90 tablet 3   ketoconazole (NIZORAL) 2 % cream APPLY TOPICALLY TWICE DAILY AS NEEDED FOR IRRITATION. 60 g 0   metoprolol succinate (TOPROL XL) 25 MG 24 hr tablet Take 0.5 tablets (12.5 mg total) by mouth daily. 45 tablet 3   NITROSTAT 0.4 MG SL tablet DISSOLVE ONE TABLET UNDER THE TONGUE EVERY 5 MINUTES AS NEEDED. 25 tablet 1   Omega-3 Fatty Acids (FISH OIL) 1000 MG CAPS Take 2 capsules by mouth daily at 12 noon.     potassium chloride (KLOR-CON) 10 MEQ tablet Take 10 meq (one tablet) on Tuesday,  Thursday and Saturday 90 tablet 3   simvastatin (ZOCOR) 20 MG tablet Take 1 tablet (20 mg total) by mouth daily at 6 PM. 90 tablet 3    Allergies Allergies  Allergen Reactions   Other Nausea Only    UNKNOWN PAIN MED    Family History Family History  Problem Relation Age of Onset   Heart disease Father    Colon cancer Brother 90   Healthy Child        x 4   Healthy Grandchild        x 11   Colon cancer Sister 37   Prostate cancer Brother     Review of Systems: Constitutional:  no fevers Eye:  no recent significant change in vision Ears:  No changes in hearing Nose/Mouth/Throat:  no complaints of nasal congestion, no sore throat Cardiovascular: no chest pain Respiratory:  No shortness of breath Gastrointestinal:  No change in bowel habits GU:  No frequency Integumentary:  no abnormal skin lesions reported Neurologic:  no headaches Endocrine:  denies unexplained weight changes  Exam BP 112/68 (BP Location: Left Arm, Patient Position: Sitting, Cuff Size: Normal)   Pulse 63   Temp 98 F (36.7 C) (Oral)   Ht 5\' 7"  (  1.702 m)   Wt 182 lb 8 oz (82.8 kg)   SpO2 98%   BMI 28.58 kg/m  General:  well developed, well nourished, in no apparent distress Skin: on top of scalp, excoriated lesions over parieto-occ region- no erythema, warmth, drainage, fluctuance; otherwise no significant moles, warts, or growths Head:  no masses, lesions, or tenderness Eyes:  pupils equal and round, sclera anicteric without injection Ears:  canals without lesions, TMs shiny without retraction, no obvious effusion, no erythema Nose:  nares patent, mucosa normal Throat/Pharynx:  lips and gingiva without lesion; tongue and uvula midline; non-inflamed pharynx; no exudates or postnasal drainage Lungs:  clear to auscultation, breath sounds equal bilaterally, no respiratory distress Cardio:  regular rate and rhythm, 2+ b/l pitting LE edema Rectal: Deferred GI: BS+, S, NT, ND, no masses or  organomegaly Musculoskeletal:  symmetrical muscle groups noted without atrophy or deformity Neuro:  gait normal; deep tendon reflexes normal and symmetric Psych: well oriented with normal range of affect and appropriate judgment/insight  Assessment and Plan  Well adult exam  Essential hypertension - Plan: CBC, Comprehensive metabolic panel, Lipid panel   Well 89 y.o. male. Counseled on diet and exercise. Shingrix rec'd, pt in pre contemplative phase. Wound from garage door. Will update Td today.  Advanced directive form provided today.  Other orders as above. Follow up in 6 mo.  The patient voiced understanding and agreement to the plan.  Jilda Roche [place], DO 10/07/22 9:14 AM

## 2022-10-07 NOTE — Patient Instructions (Signed)
Give us 2-3 business days to get the results of your labs back.   Keep the diet clean and stay active.  Please get me a copy of your advanced directive form at your convenience.   I recommend getting the flu shot in mid October. This suggestion would change if the CDC comes out with a different recommendation.   Let us know if you need anything.  

## 2022-10-07 NOTE — Addendum Note (Signed)
Addended by: Scharlene Gloss B on: 10/07/2022 09:26 AM   Modules accepted: Orders

## 2022-10-20 ENCOUNTER — Telehealth: Payer: Self-pay | Admitting: Family Medicine

## 2022-10-20 MED ORDER — METOPROLOL SUCCINATE ER 25 MG PO TB24: 12.5000 mg | ORAL_TABLET | Freq: Every day | ORAL | 3 refills | Status: DC

## 2022-10-20 NOTE — Addendum Note (Signed)
Addended by: Scharlene Gloss B on: 10/20/2022 01:36 PM   Modules accepted: Orders

## 2022-10-20 NOTE — Telephone Encounter (Signed)
Refill done.  

## 2022-10-20 NOTE — Telephone Encounter (Signed)
Prescription Request  10/20/2022  Is this a "Controlled Substance" medicine? No  LOV: 10/07/2022  What is the name of the medication or equipment? metoprolol succinate (TOPROL XL) 25 MG 24 hr tablet  Have you contacted your pharmacy to request a refill? No   Which pharmacy would you like this sent to?  [facility] [address] [place] - [address] [place] [address] Phone: [phone] Fax: [phone]    Patient notified that their request is being sent to the clinical staff for review and that they should receive a response within 2 business days.   Please advise at [facility] [phone]

## 2023-02-02 ENCOUNTER — Other Ambulatory Visit: Payer: Self-pay | Admitting: Family Medicine

## 2023-02-02 DIAGNOSIS — I25701 Atherosclerosis of coronary artery bypass graft(s), unspecified, with angina pectoris with documented spasm: Secondary | ICD-10-CM

## 2023-02-02 MED ORDER — SIMVASTATIN 20 MG PO TABS
20.0000 mg | ORAL_TABLET | Freq: Every day | ORAL | 1 refills | Status: DC
Start: 2023-02-02 — End: 2023-08-11

## 2023-02-02 NOTE — Telephone Encounter (Signed)
 Copied from CRM [id number]. Topic: Clinical - Medication Refill >> Feb 02, 2023  1:54 PM Thersia BROCKS wrote: Most Recent Primary Care Visit:  Provider: [name]  Department: [facility]  Visit Type: PHYSICAL  Date: 10/07/2022  Medication: simvastatin  (ZOCOR ) 20 MG tablet  Has the patient contacted their pharmacy? Yes (Agent: If no, request that the patient contact the pharmacy for the refill. If patient does not wish to contact the pharmacy document the reason why and proceed with request.) (Agent: If yes, when and what did the pharmacy advise?)  Is this the correct pharmacy for this prescription? Yes If no, delete pharmacy and type the correct one.  This is the patient's preferred pharmacy:  [facility] [address] [place] - [address] [place]  [place] [address] Phone: [phone] Fax: [phone]   Has the prescription been filled recently? No  Is the patient out of the medication? Yes  Has the patient been seen for an appointment in the last year OR does the patient have an upcoming appointment? Yes  Can we respond through MyChart? No  Agent: Please be advised that Rx refills may take up to 3 business days. We ask that you follow-up with your pharmacy.

## 2023-02-03 MED ORDER — KETOCONAZOLE 2 % EX CREA: TOPICAL_CREAM | CUTANEOUS | 0 refills | Status: DC

## 2023-02-04 NOTE — Telephone Encounter (Signed)
 Copied from CRM [id number]. Topic: Clinical - Medication Refill >> Feb 04, 2023  2:22 PM Franky GRADE wrote: Most Recent Primary Care Visit:  Provider: [name]  Department: [facility]  Visit Type: PHYSICAL  Date: 10/07/2022  Medication: potassium chloride  (KLOR-CON ) 10 MEQ tablet & furosemide  (LASIX ) 20 MG tablet  Has the patient contacted their pharmacy? No, patient went to pick up meds but the pharmacy did not receive a prescription for the Lasix .  (Agent: If no, request that the patient contact the pharmacy for the refill. If patient does not wish to contact the pharmacy document the reason why and proceed with request.) (Agent: If yes, when and what did the pharmacy advise?)  Is this the correct pharmacy for this prescription? Yes If no, delete pharmacy and type the correct one.  This is the patient's preferred pharmacy:  [facility] [address], [place] - [address] [place] [address] Phone: [phone] Fax: [phone]   Has the prescription been filled recently? No  Is the patient out of the medication? Yes  Has the patient been seen for an appointment in the last year OR does the patient have an upcoming appointment? Yes  Can we respond through MyChart? No  Agent: Please be advised that Rx refills may take up to 3 business days. We ask that you follow-up with your pharmacy.

## 2023-02-05 ENCOUNTER — Telehealth: Payer: Self-pay

## 2023-02-05 MED ORDER — POTASSIUM CHLORIDE ER 10 MEQ PO TBCR: EXTENDED_RELEASE_TABLET | ORAL | 3 refills | Status: DC

## 2023-02-05 MED ORDER — FUROSEMIDE 20 MG PO TABS: ORAL_TABLET | ORAL | 3 refills | Status: DC

## 2023-02-05 NOTE — Telephone Encounter (Signed)
 Rxs sent

## 2023-02-05 NOTE — Telephone Encounter (Signed)
 Copied from CRM [id number]. Topic: Clinical - Prescription Issue >> Feb 04, 2023  4:54 PM Drema MATSU wrote: Reason for CRM: Patient is calling to check the status of prescription refill for potassium chloride  and furosemide . He stated that pharmacy doesn't have refill. CRM looks as if it was automatically resolved but patient is in need of medication.

## 2023-02-18 ENCOUNTER — Encounter: Payer: Self-pay | Admitting: Family Medicine

## 2023-02-18 ENCOUNTER — Telehealth: Payer: Self-pay | Admitting: Family Medicine

## 2023-02-18 ENCOUNTER — Ambulatory Visit: Payer: Medicare Other | Admitting: Family Medicine

## 2023-02-18 VITALS — BP 138/70 | HR 84 | Temp 97.8°F | Resp 16 | Ht 67.0 in | Wt 180.4 lb

## 2023-02-18 DIAGNOSIS — R300 Dysuria: Secondary | ICD-10-CM

## 2023-02-18 DIAGNOSIS — K5900 Constipation, unspecified: Secondary | ICD-10-CM

## 2023-02-18 DIAGNOSIS — I10 Essential (primary) hypertension: Secondary | ICD-10-CM

## 2023-02-18 LAB — URINALYSIS, ROUTINE W REFLEX MICROSCOPIC
Bilirubin Urine: NEGATIVE
Nitrite: NEGATIVE
Specific Gravity, Urine: 1.02 (ref 1.000–1.030)
Urine Glucose: NEGATIVE
Urobilinogen, UA: 1 (ref 0.0–1.0)
pH: 6 (ref 5.0–8.0)

## 2023-02-18 NOTE — Progress Notes (Signed)
Chief Complaint  Patient presents with   Dysuria    Dysuria    Subjective: Patient is a 89 y.o. male here for urinary troubles.  Difficulty urinating for 4-5 days. He has to strain to get urine out. He is going frequently, every 30-40 min that resolved. He is still going freq, having retention, hesitancy, urgency, dysuria, constipation. No abd pain, fevers, N/V.  He is not circumcised.  He has never had a UTI.  Blood pressures have been running in the low 100s/50s.  He has been taking metoprolol XL to 12.5 mg daily.  He has been having more lightheadedness over the past week since dealing with the above issue.  No chest pain or shortness of breath.  Past Medical History:  Diagnosis Date   Allergy    Coronary artery disease    Hyperlipidemia    Hypertension    Personal history of colonic adenomas 05/10/2012   Skin cancer, basal cell     Objective: BP 138/70   Pulse 84   Temp 97.8 F (36.6 C) (Oral)   Resp 16   Ht 5\' 7"  (1.702 m)   Wt 180 lb 6.4 oz (81.8 kg)   SpO2 98%   BMI 28.25 kg/m  General: Awake, appears stated age Heart: RRR, no LE edema Lungs: CTAB, no rales, wheezes or rhonchi. No accessory muscle use Abd: BS+, S, NT, ND Psych: Age appropriate judgment and insight, normal affect and mood  Assessment and Plan: Dysuria - Plan: Urinalysis, Urine Culture  Constipation, unspecified constipation type  Essential hypertension  Check urine.  Elected to focus on abdominal issues rather than treating empirically for an infection. Stay hydrated.  Fiber supplement recommended.  Consider a suppository.  Take 4 ounces of prune juice and 2 tablespoons of milk of magnesia to stimulate a bowel movement.  Stay active. Chronic, not ideally situated.  Hold metoprolol while dealing with the above issue.  May be having excessive low blood pressure symptoms.  Continue to monitor blood pressure at home. The patient voiced understanding and agreement to the plan.  Jilda Roche  [place], DO 02/18/23  12:04 PM

## 2023-02-18 NOTE — Telephone Encounter (Signed)
Copied from CRM [id number]. Topic: Clinical - Medication Question >> Feb 18, 2023  3:17 PM Shelbie Proctor wrote: Reason for CRM: Patient [phone] forgot to tell Dr. Carmelia Roller during OV today, why patient is not well can be from having side effects: hard to urinate for 2 days, and dizziness be from the triamcinolone acetonide 0.1% cream for 10 days. Patient is not having dizzniess right now. Patient wants to ask if that could be the case. Please advise and call back.

## 2023-02-18 NOTE — Patient Instructions (Addendum)
Try 2 tablespoons of milk of mag in 4 oz of warm prune juice. Do that and wait a couple hours. If no improvement, try a Dulcolax suppository and then let me know if we are still having issues.   Take Metamucil or Benefiber daily.  Stay hydrated.   Warning signs/symptoms: Uncontrollable nausea/vomiting, fevers, worsening symptoms despite treatment, confusion.  Give [place] around 2 business days to get culture back to you.  Hold the metoprolol for now. If the blood pressure comes up after this, we can resume it.   Let us know if you need anything.

## 2023-02-19 LAB — URINE CULTURE
MICRO NUMBER:: 15983648
Result:: NO GROWTH
SPECIMEN QUALITY:: ADEQUATE

## 2023-02-19 NOTE — Telephone Encounter (Signed)
Not likely. I wouldn't change the plan based on that. Ty.

## 2023-02-20 NOTE — Telephone Encounter (Signed)
Called spoke with pt was advised of lab results.

## 2023-03-04 ENCOUNTER — Ambulatory Visit: Payer: Self-pay | Admitting: Family Medicine

## 2023-03-04 NOTE — Progress Notes (Signed)
 Established patient visit   Patient: Robert Pacheco   DOB: [birth year]   89 y.o. Male  MRN: [id number] Visit Date: 03/05/2023  Today's healthcare provider: Manuelita Flatness, PA-C   Cc. Dark stools, dizziness  Subjective     Pt reports over the last two weeks he has appreciated a dark, nearly black stool color.  He also reports when he stands he feels dizzy. Denies vertigo. He recently stopped metoprolol  2/2 to hypotension, but reports he has appreciated higher Bps at home-- 140s/150s / 70s.   Pt denies abdominal pain, heartburn, reflux. He reports recent, within the last 2 weeks, constipation, taking various laxatives over the counter, including pepto bismol. He has stopped all laxatives and had a normal BM today.   Medications: Outpatient Medications Prior to Visit  Medication Sig   aspirin  81 MG chewable tablet Chew 1 tablet (81 mg total) by mouth daily.   bisacodyl (DULCOLAX) 5 MG EC tablet Take 5 mg by mouth daily as needed for moderate constipation.   furosemide  (LASIX ) 20 MG tablet Take 20 mg (one tablet) on Tuesday, Thursday and Saturday   ketoconazole  (NIZORAL ) 2 % cream APPLY TOPICALLY TWICE DAILY AS NEEDED FOR IRRITATION.   NITROSTAT  0.4 MG SL tablet DISSOLVE ONE TABLET UNDER THE TONGUE EVERY 5 MINUTES AS NEEDED.   Omega-3 Fatty Acids (FISH OIL) 1000 MG CAPS Take 2 capsules by mouth daily at 12 noon.   potassium chloride  (KLOR-CON ) 10 MEQ tablet Take 10 meq (one tablet) on Tuesday, Thursday and Saturday   simvastatin  (ZOCOR ) 20 MG tablet Take 1 tablet (20 mg total) by mouth daily at 6 PM.   triamcinolone  cream (KENALOG ) 0.1 % Apply 1 Application topically 2 (two) times daily.   No facility-administered medications prior to visit.    Review of Systems  Constitutional:  Negative for fatigue and fever.  Respiratory:  Negative for cough and shortness of breath.   Cardiovascular:  Negative for chest pain, palpitations and leg swelling.  Gastrointestinal:        Dark  stools  Neurological:  Positive for dizziness. Negative for headaches.       Objective    BP (!) 159/76   Pulse 86   Temp 98.6 F (37 C) (Oral)   Ht 5' 7 (1.702 m)   Wt 181 lb (82.1 kg)   SpO2 97%   BMI 28.35 kg/m    Physical Exam Constitutional:      General: He is awake.     Appearance: He is well-developed.  HENT:     Head: Normocephalic.  Eyes:     Conjunctiva/sclera: Conjunctivae normal.  Cardiovascular:     Rate and Rhythm: Normal rate and regular rhythm.     Heart sounds: Normal heart sounds.  Pulmonary:     Effort: Pulmonary effort is normal.  Abdominal:     General: There is no distension.     Palpations: Abdomen is soft.     Tenderness: There is no abdominal tenderness. There is no guarding.  Skin:    General: Skin is warm.  Neurological:     Mental Status: He is alert and oriented to person, place, and time.  Psychiatric:        Attention and Perception: Attention normal.        Mood and Affect: Mood normal.        Speech: Speech normal.        Behavior: Behavior is cooperative.     No results found for  any visits on 03/05/23.  Assessment & Plan    Black stool -     CBC with Differential/Platelet -     IBC + Ferritin -     Fecal occult blood, imunochemical  Dizziness -     CBC with Differential/Platelet -     IBC + Ferritin -     Fecal occult blood, imunochemical  Will check fobt for +blood, cbc/ iron for anemia.  Advised some otc products can causes changes in stool color.  Will f/u with results  Blood pressure was initially elevated, but on repeat was 128/78, advised to still d/c metoprolol  to avoid hypotension  Return if symptoms worsen or fail to improve.       Manuelita Flatness, PA-C  [facility] at [facility] [phone] (phone) [phone] (fax)  [facility]

## 2023-03-04 NOTE — Telephone Encounter (Signed)
 Copied from CRM [id number]. Topic: Clinical - Red Word Triage >> Mar 04, 2023  9:50 AM Pinkey ORN wrote: Red Word that prompted transfer to Nurse Triage: Dizzy Spells  Chief Complaint: Changes in stool color Symptoms: Changes in stool color Frequency: 2 weeks Pertinent Negatives: Patient denies bleeding, NV Disposition: [] ED /[] Urgent Care (no appt availability in office) / [x] Appointment(In office/virtual)/ []  [place] [facility]/ [] Home Care/ [] Refused Recommended Disposition /[] [place] [facility]/ []  Follow-up with PCP Additional Notes: Patient called in to report a change in the color of his stool. Patient was seen in the office on 02/18/23 for irregular bowel movements. Following the appointment, patient stated that he started taking metamucil and Ducolax. Patient stated that his bowel movements are now regular, but he has noticed a change in color over the past 2 weeks. Patient stated the stools typically have a blackened appearance, but they have lightened up as of this morning. Patient denied a tarry or mucousy appearance of his stools. Patient denied blood in his stools or rectal bleeding. Patient denied NV. This RN advised patient to follow-up with provider in the office. No availability until next week with PCP. Scheduled patient in the office for tomorrow morning with a different provider. Advised patient to call back if symptoms worsen. Patient complied.   Reason for Disposition  [1] Abnormal color is unexplained AND [2] persists > 24 hours  Answer Assessment - Initial Assessment Questions 1. COLOR: What color is it? Is that color in part or all of the stool?     States stool is generally blackened, but states it lightened up this morning 2. ONSET: When was the unusual color first noted?     2 weeks ago 3. CAUSE: Have you eaten any food or taken any medicine of this color? Note: See listing in Background Information section.      States he started taking metamucil and  Ducolax  4. OTHER SYMPTOMS: Do you have any other symptoms? (e.g., abdomen pain, diarrhea, jaundice, fever).     Occasional headaches in the mornings, denies NV, denies bleeding or blood in stools, denies tarry or mucousy appearance of stools  Protocols used: Stools - Unusual Color-A-AH

## 2023-03-05 ENCOUNTER — Ambulatory Visit: Payer: Medicare Other | Admitting: Physician Assistant

## 2023-03-05 ENCOUNTER — Encounter: Payer: Self-pay | Admitting: Physician Assistant

## 2023-03-05 ENCOUNTER — Other Ambulatory Visit: Payer: Medicare Other

## 2023-03-05 VITALS — BP 128/78 | HR 86 | Temp 98.6°F | Ht 67.0 in | Wt 181.0 lb

## 2023-03-05 DIAGNOSIS — K921 Melena: Secondary | ICD-10-CM

## 2023-03-05 DIAGNOSIS — R42 Dizziness and giddiness: Secondary | ICD-10-CM

## 2023-03-05 LAB — CBC WITH DIFFERENTIAL/PLATELET
Basophils Absolute: 0.1 10*3/uL (ref 0.0–0.1)
Basophils Relative: 1.1 % (ref 0.0–3.0)
Eosinophils Absolute: 0 10*3/uL (ref 0.0–0.7)
Eosinophils Relative: 0.6 % (ref 0.0–5.0)
HCT: 43.7 % (ref 39.0–52.0)
Hemoglobin: 14.5 g/dL (ref 13.0–17.0)
Lymphocytes Relative: 22.3 % (ref 12.0–46.0)
Lymphs Abs: 1 10*3/uL (ref 0.7–4.0)
MCHC: 33.2 g/dL (ref 30.0–36.0)
MCV: 93.6 fL (ref 78.0–100.0)
Monocytes Absolute: 0.3 10*3/uL (ref 0.1–1.0)
Monocytes Relative: 6.1 % (ref 3.0–12.0)
Neutro Abs: 3.3 10*3/uL (ref 1.4–7.7)
Neutrophils Relative %: 69.9 % (ref 43.0–77.0)
Platelets: 242 10*3/uL (ref 150.0–400.0)
RBC: 4.67 Mil/uL (ref 4.22–5.81)
RDW: 13.3 % (ref 11.5–15.5)
WBC: 4.7 10*3/uL (ref 4.0–10.5)

## 2023-03-05 LAB — IBC + FERRITIN
Ferritin: 95.3 ng/mL (ref 22.0–322.0)
Iron: 104 ug/dL (ref 42–165)
Saturation Ratios: 43.7 % (ref 20.0–50.0)
TIBC: 238 ug/dL — ABNORMAL LOW (ref 250.0–450.0)
Transferrin: 170 mg/dL — ABNORMAL LOW (ref 212.0–360.0)

## 2023-03-05 LAB — FECAL OCCULT BLOOD, IMMUNOCHEMICAL: Fecal Occult Bld: NEGATIVE

## 2023-03-10 ENCOUNTER — Ambulatory Visit: Payer: Self-pay | Admitting: Family Medicine

## 2023-03-10 NOTE — Telephone Encounter (Signed)
  Chief Complaint: Lab results  Disposition: [] ED /[] Urgent Care (no appt availability in office) / [] Appointment(In office/virtual)/ []  [place] [facility]/ [x] Home Care/ [] Refused Recommended Disposition /[] [place] [facility]/ []  Follow-up with PCP Additional Notes: Patient requests this RN review lab results and provider recommendations with him, states he does not recall receiving results. Per protocol, home care appropriate. Care advice reviewed, patient verbalized understanding and denies further questions at this time. Alerting PCP for review.    Copied from CRM [id number]. Topic: Clinical - Lab/Test Results >> Mar 10, 2023  2:38 PM [place] J wrote: Reason for CRM: Patient would like blood test results read to him as well as additional information on next steps. Reason for Disposition  Health Information question, no triage required and triager able to answer question  Answer Assessment - Initial Assessment Questions 1. REASON FOR CALL or QUESTION: "What is your reason for calling today?" or "How can I best help you?" or "What question do you have that I can help answer?"     Patient requested this RN review labs with patient again, he reports he is unsure what the results were from the 03/05/23 lab and stool sample.  Protocols used: Information Only Call - No Triage-A-AH

## 2023-03-11 ENCOUNTER — Ambulatory Visit: Payer: Medicare Other | Admitting: Family Medicine

## 2023-03-26 ENCOUNTER — Ambulatory Visit: Payer: Medicare Other

## 2023-03-26 VITALS — Ht 67.0 in | Wt 178.0 lb

## 2023-03-26 DIAGNOSIS — Z0000 Encounter for general adult medical examination without abnormal findings: Secondary | ICD-10-CM

## 2023-03-26 NOTE — Patient Instructions (Addendum)
 [name] , Thank you for taking time to come for your Medicare Wellness Visit. I appreciate your ongoing commitment to your health goals. Please review the following plan we discussed and let me know if I can assist you in the future.   Referrals/Orders/Follow-Ups/Clinician Recommendations:   This is a list of the screening recommended for you and due dates:  Health Maintenance  Topic Date Due   COVID-19 Vaccine (6 - 2024-25 season) 01/06/2023   Medicare Annual Wellness Visit  03/25/2024   DTaP/Tdap/Td vaccine (4 - Td or Tdap) 10/06/2032   Pneumonia Vaccine  Completed   Flu Shot  Completed   HPV Vaccine  Aged Out   Zoster (Shingles) Vaccine  Discontinued    Advanced directives: (Copy Requested) Please bring a copy of your health care power of attorney and living will to the office to be added to your chart at your convenience.  Next Medicare Annual Wellness Visit scheduled for next year: Yes

## 2023-03-26 NOTE — Progress Notes (Signed)
 Subjective:   Robert Pacheco is a 89 y.o. male who presents for Medicare Annual/Subsequent preventive examination.  Visit Complete: Virtual I connected with  Yves Dill on 03/26/23 by a audio enabled telemedicine application and verified that I am speaking with the correct person using two identifiers.  Patient Location: Home  Provider Location: Home Office  I discussed the limitations of evaluation and management by telemedicine. The patient expressed understanding and agreed to proceed.  Vital Signs: Because this visit was a virtual/telehealth visit, some criteria may be missing or patient reported. Any vitals not documented were not able to be obtained and vitals that have been documented are patient reported.    Cardiac Risk Factors include: advanced age (>58men, >48 women);male gender;hypertension     Objective:    Today's Vitals   03/26/23 1013  Weight: 178 lb (80.7 kg)  Height: 5\' 7"  (1.702 m)   Body mass index is 27.88 kg/m.     03/26/2023   10:22 AM 05/26/2022   12:50 PM 03/07/2022    9:41 AM 03/05/2021    8:26 AM 12/15/2019    3:25 PM 08/01/2019   10:02 AM 10/15/2018    8:25 AM  Advanced Directives  Does Patient Have a Medical Advance Directive? Yes Yes Yes Yes Yes No Yes  Type of Estate agent of [place];Living will Living will Healthcare Power of [place];Living will Healthcare Power of [place];Living will Healthcare Power of [place];Living will  Healthcare Power of [place];Living will  Does patient want to make changes to medical advance directive?   No - Patient declined    No - Patient declined  Copy of Healthcare Power of Attorney in Chart? No - copy requested  No - copy requested No - copy requested No - copy requested  No - copy requested  Would patient like information on creating a medical advance directive?      No - Patient declined     Current Medications (verified) Outpatient Encounter Medications as of 03/26/2023  Medication  Sig   aspirin 81 MG chewable tablet Chew 1 tablet (81 mg total) by mouth daily.   bisacodyl (DULCOLAX) 5 MG EC tablet Take 5 mg by mouth daily as needed for moderate constipation.   furosemide (LASIX) 20 MG tablet Take 20 mg (one tablet) on Tuesday, Thursday and Saturday   ketoconazole (NIZORAL) 2 % cream APPLY TOPICALLY TWICE DAILY AS NEEDED FOR IRRITATION.   NITROSTAT 0.4 MG SL tablet DISSOLVE ONE TABLET UNDER THE TONGUE EVERY 5 MINUTES AS NEEDED.   Omega-3 Fatty Acids (FISH OIL) 1000 MG CAPS Take 2 capsules by mouth daily at 12 noon.   potassium chloride (KLOR-CON) 10 MEQ tablet Take 10 meq (one tablet) on Tuesday, Thursday and Saturday   simvastatin (ZOCOR) 20 MG tablet Take 1 tablet (20 mg total) by mouth daily at 6 PM.   triamcinolone cream (KENALOG) 0.1 % Apply 1 Application topically 2 (two) times daily.   No facility-administered encounter medications on file as of 03/26/2023.    Allergies (verified) Other   History: Past Medical History:  Diagnosis Date   Allergy    Coronary artery disease    Hyperlipidemia    Hypertension    Personal history of colonic adenomas 05/10/2012   Skin cancer, basal cell    Past Surgical History:  Procedure Laterality Date   CATARACT EXTRACTION     COLONOSCOPY  multiple   CORONARY ARTERY BYPASS GRAFT  2000   EYE SURGERY  HERNIA REPAIR     MOHS SURGERY     Family History  Problem Relation Age of Onset   Heart disease Father    Colon cancer Brother 65   Healthy Child        x 4   Healthy Grandchild        x 11   Colon cancer Sister 48   Prostate cancer Brother    Social History   Socioeconomic History   Marital status: Widowed    Spouse name: Not on file   Number of children: 4   Years of education: Not on file   Highest education level: Not on file  Occupational History   Occupation: retired    Comment: Regulatory affairs officer  Tobacco Use   Smoking status: Former   Smokeless tobacco: Never  Advertising account planner   Vaping status:  Never Used  Substance and Sexual Activity   Alcohol use: No   Drug use: No   Sexual activity: Not on file  Other Topics Concern   Not on file  Social History Narrative   Not on file   Social Drivers of Health   Financial Resource Strain: Low Risk  (03/26/2023)   Overall Financial Resource Strain (CARDIA)    Difficulty of Paying Living Expenses: Not hard at all  Food Insecurity: No Food Insecurity (03/26/2023)   Hunger Vital Sign    Worried About Running Out of Food in the Last Year: Never true    Ran Out of Food in the Last Year: Never true  Transportation Needs: No Transportation Needs (03/26/2023)   PRAPARE - Administrator, Civil Service (Medical): No    Lack of Transportation (Non-Medical): No  Physical Activity: Inactive (03/26/2023)   Exercise Vital Sign    Days of Exercise per Week: 0 days    Minutes of Exercise per Session: 0 min  Stress: No Stress Concern Present (03/26/2023)   [employer] of Occupational Health - Occupational Stress Questionnaire    Feeling of Stress : Not at all  Social Connections: Moderately Integrated (03/26/2023)   Social Connection and Isolation Panel [NHANES]    Frequency of Communication with Friends and Family: More than three times a week    Frequency of Social Gatherings with Friends and Family: More than three times a week    Attends Religious Services: More than 4 times per year    Active Member of [employer] or Organizations: Yes    Attends Banker Meetings: More than 4 times per year    Marital Status: Widowed    Tobacco Counseling Counseling given: Not Answered   Clinical Intake:  Pre-visit preparation completed: Yes  Pain : No/denies pain     BMI - recorded: 27.88 Nutritional Status: BMI 25 -29 Overweight Nutritional Risks: None Diabetes: No  How often do you need to have someone help you when you read instructions, pamphlets, or other written materials from your doctor or pharmacy?: 1 -  Never  Interpreter Needed?: No  Information entered by :: Theresa Mulligan LPN   Activities of Daily Living    03/26/2023   10:20 AM  In your present state of health, do you have any difficulty performing the following activities:  Hearing? 1  Comment Wears Hearing Aids  Vision? 0  Difficulty concentrating or making decisions? 0  Walking or climbing stairs? 0  Dressing or bathing? 0  Doing errands, shopping? 0  Preparing Food and eating ? N  Using the Toilet? N  In  the past six months, have you accidently leaked urine? N  Do you have problems with loss of bowel control? N  Managing your Medications? N  Managing your Finances? N  Housekeeping or managing your Housekeeping? N    Patient Care Team: Sharlene Dory, DO as PCP - General (Family Medicine) Thomasene Ripple, DO as PCP - Cardiology (Cardiology)  Indicate any recent Medical Services you may have received from other than [facility] providers in the past year (date may be approximate).     Assessment:   This is a routine wellness examination for [place].  Hearing/Vision screen Hearing Screening - Comments:: Wears Hearing Aids Vision Screening - Comments:: Wears rx glasses - up to date with routine eye exams with  [facility]   Goals Addressed               This Visit's Progress     Increase physical activity (pt-stated)        Stay Active.       Depression Screen    03/26/2023   10:18 AM 10/07/2022    9:06 AM 03/07/2022    9:43 AM 10/01/2021    8:49 AM 03/05/2021    8:30 AM 06/26/2020   11:12 AM 12/15/2019    3:27 PM  PHQ 2/9 Scores  PHQ - 2 Score 0 0 0 0 0 0 0  PHQ- 9 Score  0  0       Fall Risk    03/26/2023   10:21 AM 10/07/2022    9:05 AM 03/07/2022    9:41 AM 10/01/2021    8:49 AM 03/05/2021    8:28 AM  Fall Risk   Falls in the past year? 0 0 0 0 0  Number falls in past yr: 0 0 0 0 0  Injury with Fall? 0 0 0 0 0  Risk for fall due to : No Fall Risks No Fall Risks No Fall Risks No Fall Risks    Follow up Falls prevention discussed;Falls evaluation completed Falls evaluation completed Falls evaluation completed Falls evaluation completed Falls prevention discussed    MEDICARE RISK AT HOME: Medicare Risk at Home Any stairs in or around the home?: No If so, are there any without handrails?: No Home free of loose throw rugs in walkways, pet beds, electrical cords, etc?: Yes Adequate lighting in your home to reduce risk of falls?: Yes Life alert?: No Use of a cane, walker or w/c?: No Grab bars in the bathroom?: Yes Shower chair or bench in shower?: Yes Elevated toilet seat or a handicapped toilet?: No  TIMED UP AND GO:  Was the test performed?  No    Cognitive Function:    10/12/2017    3:10 PM 10/08/2016    8:40 AM  MMSE - Mini Mental State Exam  Orientation to time 5 5  Orientation to Place 5 5  Registration 3 3  Attention/ Calculation 4 4  Recall 1 2  Language- name 2 objects 2 2  Language- repeat 1 1  Language- follow 3 step command 3 3  Language- read & follow direction 1 1  Write a sentence 1 1  Copy design 1 0  Total score 27 27        03/26/2023   10:22 AM 03/07/2022    9:47 AM  6CIT Screen  What Year? 0 points 0 points  What month? 0 points 0 points  What time? 0 points 0 points  Count back from 20 0 points  0 points  Months in reverse 0 points 0 points  Repeat phrase 0 points 6 points  Total Score 0 points 6 points    Immunizations Immunization History  Administered Date(s) Administered   Fluad Quad(high Dose 65+) 10/15/2018   Influenza Split 12/31/2010, 01/14/2012   Influenza Whole 12/03/2006, 11/10/2007, 10/31/2008, 12/10/2009   Influenza, High Dose Seasonal PF 12/15/2012, 12/13/2013, 10/27/2021, 10/12/2022   Influenza,inj,Quad PF,6+ Mos 10/25/2019   Influenza-Unspecified 02/26/2017, 10/27/2020   PFIZER(Purple Top)SARS-COV-2 Vaccination 02/16/2019, 03/09/2019, 12/13/2019   Pfizer Covid-19 Vaccine Bivalent Booster 5y-11y 10/27/2020    Pfizer(Comirnaty)Fall Seasonal Vaccine 12 years and older 10/12/2022, 11/11/2022   Pneumococcal Conjugate-13 07/28/2013   Pneumococcal Polysaccharide-23 01/27/2001, 03/02/2007   Td 01/28/2004, 10/07/2022   Tdap 09/11/2014    TDAP status: Up to date  Flu Vaccine status: Up to date  Pneumococcal vaccine status: Up to date  Covid-19 vaccine status: Declined, Education has been provided regarding the importance of this vaccine but patient still declined. Advised may receive this vaccine at local pharmacy or Health Dept.or vaccine clinic. Aware to provide a copy of the vaccination record if obtained from local pharmacy or Health Dept. Verbalized acceptance and understanding.    Screening Tests Health Maintenance  Topic Date Due   COVID-19 Vaccine (6 - 2024-25 season) 01/06/2023   Medicare Annual Wellness (AWV)  03/25/2024   DTaP/Tdap/Td (4 - Td or Tdap) 10/06/2032   Pneumonia Vaccine 59+ Years old  Completed   INFLUENZA VACCINE  Completed   HPV VACCINES  Aged Out   Zoster Vaccines- Shingrix  Discontinued    Health Maintenance  Health Maintenance Due  Topic Date Due   COVID-19 Vaccine (6 - 2024-25 season) 01/06/2023        Additional Screening:    Vision Screening: Recommended annual ophthalmology exams for early detection of glaucoma and other disorders of the eye. Is the patient up to date with their annual eye exam?  Yes  Who is the provider or what is the name of the office in which the patient attends annual eye exams? [facility] If pt is not established with a provider, would they like to be referred to a provider to establish care? No .   Dental Screening: Recommended annual dental exams for proper oral hygiene    Community Resource Referral / Chronic Care Management:  CRR required this visit?  No   CCM required this visit?  No     Plan:     I have personally reviewed and noted the following in the patient's chart:   Medical and social  history Use of alcohol, tobacco or illicit drugs  Current medications and supplements including opioid prescriptions. Patient is not currently taking opioid prescriptions. Functional ability and status Nutritional status Physical activity Advanced directives List of other physicians Hospitalizations, surgeries, and ER visits in previous 12 months Vitals Screenings to include cognitive, depression, and falls Referrals and appointments  In addition, I have reviewed and discussed with patient certain preventive protocols, quality metrics, and best practice recommendations. A written personalized care plan for preventive services as well as general preventive health recommendations were provided to patient.     Tillie Rung, LPN   [id number]/82/9562   After Visit Summary: (MyChart) Due to this being a telephonic visit, the after visit summary with patients personalized plan was offered to patient via MyChart   Nurse Notes: None

## 2023-04-07 ENCOUNTER — Ambulatory Visit: Payer: Medicare Other | Admitting: Family Medicine

## 2023-04-07 ENCOUNTER — Encounter: Payer: Self-pay | Admitting: Family Medicine

## 2023-04-07 VITALS — BP 136/76 | HR 79 | Resp 16 | Ht 67.0 in | Wt 176.6 lb

## 2023-04-07 DIAGNOSIS — D489 Neoplasm of uncertain behavior, unspecified: Secondary | ICD-10-CM | POA: Diagnosis not present

## 2023-04-07 DIAGNOSIS — E782 Mixed hyperlipidemia: Secondary | ICD-10-CM | POA: Diagnosis not present

## 2023-04-07 DIAGNOSIS — R600 Localized edema: Secondary | ICD-10-CM | POA: Diagnosis not present

## 2023-04-07 DIAGNOSIS — G5702 Lesion of sciatic nerve, left lower limb: Secondary | ICD-10-CM

## 2023-04-07 DIAGNOSIS — Z7689 Persons encountering health services in other specified circumstances: Secondary | ICD-10-CM

## 2023-04-07 LAB — COMPREHENSIVE METABOLIC PANEL
ALT: 12 U/L (ref 0–53)
AST: 15 U/L (ref 0–37)
Albumin: 4.2 g/dL (ref 3.5–5.2)
Alkaline Phosphatase: 92 U/L (ref 39–117)
BUN: 15 mg/dL (ref 6–23)
CO2: 30 meq/L (ref 19–32)
Calcium: 9.5 mg/dL (ref 8.4–10.5)
Chloride: 104 meq/L (ref 96–112)
Creatinine, Ser: 0.96 mg/dL (ref 0.40–1.50)
GFR: 70.87 mL/min (ref >60.00)
Glucose, Bld: 100 mg/dL — ABNORMAL HIGH (ref 70–99)
Potassium: 4.2 meq/L (ref 3.5–5.1)
Sodium: 140 meq/L (ref 135–145)
Total Bilirubin: 0.9 mg/dL (ref 0.2–1.2)
Total Protein: 6.9 g/dL (ref 6.0–8.3)

## 2023-04-07 LAB — LIPID PANEL
Cholesterol: 111 mg/dL (ref 0–200)
HDL: 37 mg/dL — ABNORMAL LOW (ref >39.00)
LDL Cholesterol: 48 mg/dL (ref 0–99)
NonHDL: 74.37
Total CHOL/HDL Ratio: 3
Triglycerides: 130 mg/dL (ref 0.0–149.0)
VLDL: 26 mg/dL (ref 0.0–40.0)

## 2023-04-07 NOTE — Patient Instructions (Addendum)
 Give [place] 2-3 business days to get the results of your labs back.   Keep the diet clean and stay active.  Heat (pad or rice pillow in microwave) over affected area, 10-15 minutes twice daily.   Ice/cold pack over area for 10-15 min twice daily.  OK to take Tylenol 1000 mg (2 extra strength tabs) or 975 mg (3 regular strength tabs) every 6 hours as needed.  Give [place] 1 week to get the results of your biopsy back.  Do not shower for the rest of the day. When you do wash it, use only soap and water. Do not vigorously scrub. Apply triple antibiotic ointment (like Neosporin) twice daily. Keep the area clean and dry.   Things to look out for: increasing pain not relieved by ibuprofen/acetaminophen, fevers, spreading redness, drainage of pus, or foul odor.  Let us know if you need anything.  Piriformis Syndrome Rehab It is normal to feel mild stretching, pulling, tightness, or discomfort as you do these exercises, but you should stop right away if you feel sudden pain or your pain gets worse.   Stretching and range of motion exercises These exercises warm up your muscles and joints and improve the movement and flexibility of your hip and pelvis. These exercises also help to relieve pain, numbness, and tingling. Exercise A: Hip rotators    Lie on your back on a firm surface. Pull your left / right knee toward your same shoulder with your left / right hand until your knee is pointing toward the ceiling. Hold your left / right ankle with your other hand. Keeping your knee steady, gently pull your left / right ankle toward your other shoulder until you feel a stretch in your buttocks. Hold this position for 30 seconds. Repeat 2 times. Complete this stretch 3 times per week. Exercise B: Hip extensors Lie on your back on a firm surface. Both of your legs should be straight. Pull your left / right knee to your chest. Hold your leg in this position by holding onto the back of your thigh or the front of  your knee. Hold this position for 30 seconds. Slowly return to the starting position. Repeat 2 times. Complete this stretch 3 times per week.  Strengthening exercises These exercises build strength and endurance in your hip and thigh muscles. Endurance is the ability to use your muscles for a long time, even after they get tired. Exercise C: Straight leg raises (hip abductors)     Lie on your side with your left / right leg in the top position. Lie so your head, shoulder, knee, and hip line up. Bend your bottom knee to help you balance. Lift your top leg up 4-6 inches (10-15 cm), keeping your toes pointed straight ahead. Hold this position for 1 second. Slowly lower your leg to the starting position. Let your muscles relax completely. Repeat for a total of 10 repetitions. Repeat 2 times. Complete this exercise 3 times per week. Exercise D: Hip abductors and rotators, quadruped    Get on your hands and knees on a firm, lightly padded surface. Your hands should be directly below your shoulders, and your knees should be directly below your hips. Lift your left / right knee out to the side. Keep your knee bent. Do not twist your body. Hold this position for 1 seconds. Slowly lower your leg. Repeat for a total of 10 repetitions.  Repeat 1 times. Complete this exercise 3 times per week. Exercise E: Straight leg  raises (hip extensors) Lie on your abdomen on a bed or a firm surface with a pillow under your hips. Squeeze your buttock muscles and lift your left / right thigh off the bed. Do not let your back arch. Hold this position for 3 seconds. Slowly return to the starting position. Let your muscles relax completely before doing another repetition. Repeat 2 times. Complete this exercise 3 times per week.  This information is not intended to replace advice given to you by your health care provider. Make sure you discuss any questions you have with your health care provider. Document Released:  01/13/2005 Document Revised: 09/18/2015 Document Reviewed: 12/26/2014 Elsevier Interactive Patient Education  [employer].

## 2023-04-07 NOTE — Progress Notes (Signed)
 CC: F/u  Subjective Robert Pacheco is a 89 y.o. male who presents for LE swelling follow up. He is compliant with medication- Lasix 20 mg thrice weekly. Patient has these side effects of medication: none He is usually adhering to a healthy diet overall. Current exercise: none No CP or SOB.   Hyperlipidemia Patient presents for mixed hyperlipidemia follow up. Currently being treated with Zocor 20 mg/d and compliance with treatment thus far has been good. He denies myalgias. Diet/exercise as above.  The patient is not known to have coexisting coronary artery disease.  Left buttock pain This has been going on for approximately 1 month.  No injury or change activity.  It radiates down the left side of his thigh.  Denies bruising, redness, swelling, or neurologic signs or symptoms.  It does affect his ability to walk.  He has not tried anything at home.  Skin lesion There is a dark spot on the top of his head.  It has been there for approximately 3 weeks.  It has bled but not gone away.  There is no pain, itching, or drainage.  No new topicals.  No close contacts with similar symptoms.  He has not tried anything at home.   Past Medical History:  Diagnosis Date   Allergy    Coronary artery disease    Hyperlipidemia    Hypertension    Personal history of colonic adenomas 05/10/2012   Skin cancer, basal cell     Exam BP 136/76   Pulse 79   Resp 16   Ht 5\' 7"  (1.702 m)   Wt 176 lb 9.6 oz (80.1 kg)   SpO2 98%   BMI 27.66 kg/m  General:  well developed, well nourished, in no apparent distress Heart: RRR, no bruits, 1+ pitting bilateral LE edema tapering at the knees Lungs: clear to auscultation, no accessory muscle use Skin: See below MSK: No TTP in the lumbar spine, SI joint, or greater trochanteric region.  Mild TTP over the left piriformis.  There is pain elicited with hip flexion and adduction. Neuro: DTRs equal and symmetric in the lower extremities, no clonus, gait is normal  for the patient. Psych: well oriented with normal range of affect and appropriate judgment/insight   Scalp  Procedure note; shave biopsy Informed consent was obtained. The area was cleaned with alcohol and injected with 0.75 mL of 1% lidocaine with epinephrine. A Dermablade was slightly bent and used to cut under the area of interest. The specimen was placed in a sterile specimen cup and sent to the lab. The area was then cauterized ensuring adequate hemostasis. The area was dressed with triple antibiotic ointment and a bandage. There were no complications noted. The patient tolerated the procedure well.  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  Continue Lasix 20 mg 3 times weekly with associated potassium.  Counseled on diet and exercise. Chronic, stable.  Continue Zocor 20 mg daily. Stretches and exercises provided.  Send message if no improvement in next 3 to 4 weeks.  Ice, heat, Tylenol. Shave biopsy today.  Aftercare instructions verbalized and written down. F/u in 6 mo. The patient voiced understanding and agreement to the plan.  Jilda Roche [place], DO 04/07/23  9:47 AM

## 2023-04-09 LAB — DERMATOLOGY PATHOLOGY

## 2023-04-10 ENCOUNTER — Other Ambulatory Visit: Payer: Self-pay | Admitting: Family Medicine

## 2023-04-10 DIAGNOSIS — C4442 Squamous cell carcinoma of skin of scalp and neck: Secondary | ICD-10-CM

## 2023-04-17 ENCOUNTER — Telehealth: Payer: Self-pay

## 2023-04-17 ENCOUNTER — Telehealth: Payer: Self-pay | Admitting: *Deleted

## 2023-04-17 ENCOUNTER — Other Ambulatory Visit: Payer: Self-pay | Admitting: Family Medicine

## 2023-04-17 MED ORDER — MELOXICAM 7.5 MG PO TABS: 7.5000 mg | ORAL_TABLET | Freq: Every day | ORAL | 0 refills | Status: DC

## 2023-04-17 NOTE — Telephone Encounter (Signed)
-----   Message from Jilda Roche [place] sent at 04/17/2023  9:12 AM EDT ----- Please see if he would like PT ordered. If pain is getting worse, I would like to see him. I sent in a med to take. If he has been unsteady on feet, do not take and come see [place]. Ty.

## 2023-04-17 NOTE — Telephone Encounter (Signed)
 Refer to other phone note .

## 2023-04-17 NOTE — Telephone Encounter (Signed)
 Copied from CRM [id number]. Topic: Clinical - Prescription Issue >> Apr 16, 2023  4:08 PM Eunice Blase wrote: Reason for CRM: Pt called to let doctor know pt is not doing well. Pt has a lot of discomfort when doing exercises. Pt would like something for the pain. Please call pt at [phone]

## 2023-05-21 ENCOUNTER — Other Ambulatory Visit: Payer: Self-pay | Admitting: Family Medicine

## 2023-05-21 MED ORDER — MELOXICAM 7.5 MG PO TABS: 7.5000 mg | ORAL_TABLET | Freq: Every day | ORAL | 0 refills | Status: DC

## 2023-05-21 NOTE — Telephone Encounter (Signed)
 Copied from CRM [id number]. Topic: Clinical - Medication Refill >> May 21, 2023  3:07 PM Freya Jesus wrote: Most Recent Primary Care Visit:  Provider: Jobe Mulder  Department: [facility]  Visit Type: OFFICE VISIT  Date: 04/07/2023  Medication: meloxicam  (MOBIC ) 7.5 MG tablet [[id number]]  Has the patient contacted their pharmacy? No (Agent: If no, request that the patient contact the pharmacy for the refill. If patient does not wish to contact the pharmacy document the reason why and proceed with request.) (Agent: If yes, when and what did the pharmacy advise?)  Is this the correct pharmacy for this prescription? Yes If no, delete pharmacy and type the correct one.  This is the patient's preferred pharmacy:  [facility] [address], [place] - [address] [place] [address] Phone: [phone] Fax: [phone]   Has the prescription been filled recently? Yes  Is the patient out of the medication? Yes  Has the patient been seen for an appointment in the last year OR does the patient have an upcoming appointment? Yes  Can we respond through MyChart? No  Agent: Please be advised that Rx refills may take up to 3 business days. We ask that you follow-up with your pharmacy.

## 2023-05-28 ENCOUNTER — Telehealth: Payer: Self-pay | Admitting: Family Medicine

## 2023-05-28 NOTE — Telephone Encounter (Signed)
 Copied from CRM [id number]. Topic: Referral - Question >> May 28, 2023 10:26 AM Jethro Morrison wrote: Reason for CRM: BETHANY WITH [facility] WITH [facility] STATED WHEN THEY CALLED TO SCHEDULE PT FOR HIS APPT HE WAS NOT CLEAR WHY THEY WERE SCHEDULING HIM SHE STATED. SHE STATED CAN SOMEONE CALL HIM AND EXPLAIN TO HIM ABOUT THE REFERRAL

## 2023-05-29 NOTE — Telephone Encounter (Signed)
 Called Pt was advised and explain it. He stated understand, they should be calling about appt.  Robert Pacheco WITH DERMATOLOGY

## 2023-06-08 ENCOUNTER — Emergency Department (HOSPITAL_COMMUNITY)
Admission: EM | Admit: 2023-06-08 | Discharge: 2023-06-08 | Disposition: A | Discharge status: Home / Self Care | Attending: Emergency Medicine | Admitting: Emergency Medicine

## 2023-06-08 ENCOUNTER — Emergency Department (HOSPITAL_COMMUNITY)

## 2023-06-08 ENCOUNTER — Encounter (HOSPITAL_COMMUNITY): Payer: Self-pay | Admitting: Emergency Medicine

## 2023-06-08 DIAGNOSIS — Z79899 Other long term (current) drug therapy: Secondary | ICD-10-CM | POA: Diagnosis not present

## 2023-06-08 DIAGNOSIS — Z85828 Personal history of other malignant neoplasm of skin: Secondary | ICD-10-CM | POA: Diagnosis not present

## 2023-06-08 DIAGNOSIS — Z7982 Long term (current) use of aspirin: Secondary | ICD-10-CM | POA: Insufficient documentation

## 2023-06-08 DIAGNOSIS — R4781 Slurred speech: Secondary | ICD-10-CM | POA: Diagnosis not present

## 2023-06-08 DIAGNOSIS — R202 Paresthesia of skin: Secondary | ICD-10-CM

## 2023-06-08 DIAGNOSIS — R200 Anesthesia of skin: Secondary | ICD-10-CM | POA: Diagnosis not present

## 2023-06-08 DIAGNOSIS — K5900 Constipation, unspecified: Secondary | ICD-10-CM | POA: Insufficient documentation

## 2023-06-08 DIAGNOSIS — R1084 Generalized abdominal pain: Secondary | ICD-10-CM

## 2023-06-08 DIAGNOSIS — I2510 Atherosclerotic heart disease of native coronary artery without angina pectoris: Secondary | ICD-10-CM | POA: Insufficient documentation

## 2023-06-08 DIAGNOSIS — I10 Essential (primary) hypertension: Secondary | ICD-10-CM | POA: Insufficient documentation

## 2023-06-08 DIAGNOSIS — R109 Unspecified abdominal pain: Secondary | ICD-10-CM | POA: Diagnosis present

## 2023-06-08 LAB — URINALYSIS, ROUTINE W REFLEX MICROSCOPIC
Bacteria, UA: NONE SEEN
Bilirubin Urine: NEGATIVE
Glucose, UA: NEGATIVE mg/dL
Ketones, ur: NEGATIVE mg/dL
Leukocytes,Ua: NEGATIVE
Nitrite: NEGATIVE
Protein, ur: NEGATIVE mg/dL
Specific Gravity, Urine: 1.005 (ref 1.005–1.030)
pH: 7 (ref 5.0–8.0)

## 2023-06-08 LAB — COMPREHENSIVE METABOLIC PANEL WITH GFR
ALT: 11 U/L (ref 0–44)
AST: 19 U/L (ref 15–41)
Albumin: 3.8 g/dL (ref 3.5–5.0)
Alkaline Phosphatase: 66 U/L (ref 38–126)
Anion gap: 8 (ref 5–15)
BUN: 16 mg/dL (ref 8–23)
CO2: 25 mmol/L (ref 22–32)
Calcium: 8.8 mg/dL — ABNORMAL LOW (ref 8.9–10.3)
Chloride: 107 mmol/L (ref 98–111)
Creatinine, Ser: 0.98 mg/dL (ref 0.61–1.24)
GFR, Estimated: >60 mL/min (ref >60)
Glucose, Bld: 102 mg/dL — ABNORMAL HIGH (ref 70–99)
Potassium: 4.3 mmol/L (ref 3.5–5.1)
Sodium: 140 mmol/L (ref 135–145)
Total Bilirubin: 0.8 mg/dL (ref 0.0–1.2)
Total Protein: 6.6 g/dL (ref 6.5–8.1)

## 2023-06-08 LAB — CBC
HCT: 43.3 % (ref 39.0–52.0)
Hemoglobin: 14 g/dL (ref 13.0–17.0)
MCH: 30.5 pg (ref 26.0–34.0)
MCHC: 32.3 g/dL (ref 30.0–36.0)
MCV: 94.3 fL (ref 80.0–100.0)
Platelets: 222 10*3/uL (ref 150–400)
RBC: 4.59 MIL/uL (ref 4.22–5.81)
RDW: 13.5 % (ref 11.5–15.5)
WBC: 7 10*3/uL (ref 4.0–10.5)
nRBC: 0 % (ref 0.0–0.2)

## 2023-06-08 LAB — TROPONIN I (HIGH SENSITIVITY): Troponin I (High Sensitivity): 9 ng/L (ref <18)

## 2023-06-08 LAB — LIPASE, BLOOD: Lipase: 41 U/L (ref 11–51)

## 2023-06-08 MED ORDER — IOHEXOL 350 MG/ML SOLN
75.0000 mL | Freq: Once | INTRAVENOUS | Status: AC | PRN
  Administered 2023-06-08: 75 mL via INTRAVENOUS

## 2023-06-08 NOTE — ED Provider Notes (Signed)
  Physical Exam  BP (!) 185/72   Pulse 65   Temp 97.9 F (36.6 C) (Oral)   Resp 18   SpO2 100%   Physical Exam  Procedures  Procedures  ED Course / MDM   Clinical Course as of 06/08/23 1519  Mon Jun 08, 2023  1304 Reviewed case with neurology Dr. Bonnita Buttner.  He said to get an MRI and if it is positive he will need admission.  If negative is appropriate for outpatient follow-up. [MB]    Clinical Course User Index [MB] Tonya Fredrickson, MD   Medical Decision Making Amount and/or Complexity of Data Reviewed Labs: ordered. Radiology: ordered.  Risk Prescription drug management.   Pt comes in w/ ccof abd pain and also numbness. R sided numbness and slurred speech has resolved.  Neuro - MRI brain.  Abd - CT abd.  If neg, d/c with Neuro f/u per Dr. Randal Bury.        Deatra Face, MD 06/09/23 [phone]

## 2023-06-08 NOTE — ED Triage Notes (Addendum)
 PT BIB EMS from home with breif sudden episodes of right arm numbness, mouth numbess and per daughter pt speech was slurred. Started at 0955am and lasted 5 minutes and all symptoms resolved. PT states this morning felt nauseated and started to sweat prior to the weakness. Pt has no complaints at during triage.   EMS  194/94 18 RR 113 CBG 70 pulse  18 LAC

## 2023-06-08 NOTE — ED Notes (Signed)
 Per rn pt may be discharged may not need trop if scans are satisfactory per josh newton rn  at

## 2023-06-08 NOTE — ED Provider Notes (Signed)
 [facility] Provider Note   CSN: [id number] Arrival date & time: 06/08/23  1100     History  Chief Complaint  Patient presents with   Numbness   Nausea    Robert Pacheco is a 89 y.o. male.  He has a history of coronary disease hypertension hyperlipidemia skin cancer.  He was at home alone when he began experiencing some stomach discomfort.  He said he has a history of constipation and had not had a bowel movement in 4 to 5 days.  Took a laxative.  Stomach started hurting worse at around 955.  He noticed he was having diaphoresis and felt some numbness in his right hand and around his mouth.  Talk to his daughter on the phone and she felt his speech was a little slurred.  He said the symptoms lasted about 5 or 10 minutes at the most.  They have completely resolved now other than his stomach which only is mild discomfort.  He has never had the numbness or speech difficulty before.  No blurry vision double vision or difficulty with ambulation.  The history is provided by the patient and a relative.  Neurologic Problem This is a new problem. The current episode started 3 to 5 hours ago. The problem has been resolved. Associated symptoms include abdominal pain. Pertinent negatives include no chest pain, no headaches and no shortness of breath. Nothing aggravates the symptoms. Nothing relieves the symptoms. He has tried rest for the symptoms. The treatment provided significant relief.       Home Medications Prior to Admission medications   Medication Sig Start Date End Date Taking? Authorizing Provider  aspirin  81 MG chewable tablet Chew 1 tablet (81 mg total) by mouth daily. 09/10/20   Tobb, Kardie, DO  furosemide  (LASIX ) 20 MG tablet Take 20 mg (one tablet) on Tuesday, Thursday and Saturday 02/05/23   Jobe Mulder, DO  meloxicam  (MOBIC ) 7.5 MG tablet Take 1 tablet (7.5 mg total) by mouth daily. 05/21/23   Webb, Padonda B, FNP  NITROSTAT   0.4 MG SL tablet DISSOLVE ONE TABLET UNDER THE TONGUE EVERY 5 MINUTES AS NEEDED. 10/01/21   Jobe Mulder, DO  potassium chloride  (KLOR-CON ) 10 MEQ tablet Take 10 meq (one tablet) on Tuesday, Thursday and Saturday 02/05/23   Jobe Mulder, DO  simvastatin  (ZOCOR ) 20 MG tablet Take 1 tablet (20 mg total) by mouth daily at 6 PM. 02/02/23   Wendling, Shellie Dials, DO  triamcinolone  cream (KENALOG ) 0.1 % Apply 1 Application topically 2 (two) times daily. 02/13/23   [provider]      Allergies    Other    Review of Systems   Review of Systems  Constitutional:  Negative for fever.  Eyes:  Negative for visual disturbance.  Respiratory:  Negative for shortness of breath.   Cardiovascular:  Negative for chest pain.  Gastrointestinal:  Positive for abdominal pain and constipation.  Neurological:  Positive for speech difficulty and numbness. Negative for headaches.    Physical Exam Updated Vital Signs BP (!) 192/81   Pulse 65   Temp 97.9 F (36.6 C) (Oral)   Resp 18   SpO2 100%  Physical Exam Vitals and nursing note reviewed.  Constitutional:      General: He is not in acute distress.    Appearance: Normal appearance. He is well-developed.  HENT:     Head: Normocephalic and atraumatic.  Eyes:     Conjunctiva/sclera: Conjunctivae normal.  Cardiovascular:     Rate and Rhythm: Normal rate and regular rhythm.     Heart sounds: No murmur heard. Pulmonary:     Effort: Pulmonary effort is normal. No respiratory distress.     Breath sounds: Normal breath sounds.  Abdominal:     Palpations: Abdomen is soft.     Tenderness: There is no abdominal tenderness. There is no guarding or rebound.  Musculoskeletal:        General: No swelling.     Cervical back: Neck supple.  Skin:    General: Skin is warm and dry.     Capillary Refill: Capillary refill takes less than 2 seconds.  Neurological:     General: No focal deficit present.     Mental Status: He is alert.      Cranial Nerves: No cranial nerve deficit.     Sensory: No sensory deficit.     Motor: No weakness.     Gait: Gait normal.     ED Results / Procedures / Treatments   Labs (all labs ordered are listed, but only abnormal results are displayed) Labs Reviewed  COMPREHENSIVE METABOLIC PANEL WITH GFR - Abnormal; Notable for the following components:      Result Value   Glucose, Bld 102 (*)    Calcium 8.8 (*)    All other components within normal limits  URINALYSIS, ROUTINE W REFLEX MICROSCOPIC - Abnormal; Notable for the following components:   Color, Urine STRAW (*)    Hgb urine dipstick SMALL (*)    All other components within normal limits  LIPASE, BLOOD  CBC  TROPONIN I (HIGH SENSITIVITY)  TROPONIN I (HIGH SENSITIVITY)    EKG EKG Interpretation Date/Time:  Monday Jun 08 2023 11:18:51 EDT Ventricular Rate:  63 PR Interval:  152 QRS Duration:  86 QT Interval:  422 QTC Calculation: 431 R Axis:   53  Text Interpretation: Normal sinus rhythm Normal ECG No significant change since last tracing Confirmed by Rosealee Concha ([id number]) on 06/08/2023 12:06:06 PM  Radiology MR BRAIN WO CONTRAST Result Date: 06/08/2023 CLINICAL DATA:  Provided history: Transient ischemic attack. EXAM: MRI HEAD WITHOUT CONTRAST TECHNIQUE: Multiplanar, multiecho pulse sequences of the brain and surrounding structures were obtained without intravenous contrast. COMPARISON:  Head CT 06/08/2023. FINDINGS: Brain: Generalized parenchymal atrophy. Multifocal T2 FLAIR hyperintense signal abnormality within the cerebral white matter, nonspecific but compatible with moderate chronic small vessel ischemic disease. Punctate chronic microhemorrhage within the right occipital lobe. No cortical encephalomalacia is identified. There is no acute infarct. No evidence of an intracranial mass. No extra-axial fluid collection. No midline shift. Vascular: Maintained flow voids within the proximal large arterial vessels. Skull and upper  cervical spine: No focal worrisome marrow lesion. Incompletely assessed cervical spondylosis. Mild C2-C3 grade 1 retrolisthesis. Sinuses/Orbits: No mass or acute finding within the imaged orbits. Prior bilateral ocular lens replacement. Small-volume fluid within the left sphenoid sinus. Left sphenoid sinus chronic reactive osteitis also again noted. IMPRESSION: 1.  No evidence of an acute intracranial abnormality. 2. Moderate cerebral white matter chronic small vessel ischemic disease. 3. Generalized parenchymal atrophy. 4. Left sphenoid sinusitis. Electronically Signed   By: Bascom Lily D.O.   On: 06/08/2023 15:21   CT Head Wo Contrast Result Date: 06/08/2023 CLINICAL DATA:  Transient ischemic attack. Patient reports nausea. Right-sided facial and arm numbness. EXAM: CT HEAD WITHOUT CONTRAST TECHNIQUE: Contiguous axial images were obtained from the base of the skull through the vertex without intravenous contrast. RADIATION DOSE REDUCTION: This exam  was performed according to the departmental dose-optimization program which includes automated exposure control, adjustment of the mA and/or kV according to patient size and/or use of iterative reconstruction technique. COMPARISON:  None Available. FINDINGS: Brain: No intracranial hemorrhage, mass effect, or midline shift. Age related atrophy. No hydrocephalus. The basilar cisterns are patent. Mild to moderate periventricular chronic small vessel ischemia. No evidence of territorial infarct or acute ischemia. No extra-axial or intracranial fluid collection. Vascular: Atherosclerosis of skullbase vasculature without hyperdense vessel or abnormal calcification. Skull: No fracture or focal lesion. Sinuses/Orbits: Fluid in left side of sphenoid sinus with circumferential bony thickening typical of chronic sinusitis. No acute findings. Bilateral cataract resection. Other: None. IMPRESSION: 1. No acute intracranial abnormality. 2. Age related atrophy and chronic small  vessel ischemia. 3. Chronic sphenoid sinusitis. Electronically Signed   By: Chadwick Colonel M.D.   On: 06/08/2023 14:56    Procedures Procedures    Medications Ordered in ED Medications  iohexol (OMNIPAQUE) 350 MG/ML injection 75 mL (has no administration in time range)    ED Course/ Medical Decision Making/ A&P Clinical Course as of 06/08/23 1703  Mon Jun 08, 2023  1304 Reviewed case with neurology Dr. Bonnita Buttner.  He said to get an MRI and if it is positive he will need admission.  If negative is appropriate for outpatient follow-up. [MB]    Clinical Course User Index [MB] Tonya Fredrickson, MD                                 Medical Decision Making Amount and/or Complexity of Data Reviewed Labs: ordered. Radiology: ordered.  Risk Prescription drug management.   This patient complains of abdominal pain and constipation, numbness of right hand face and some difficulty with speech; this involves an extensive number of treatment Options and is a complaint that carries with it a high risk of complications and morbidity. The differential includes stroke, TIA, vasovagal, constipation, appendicitis, diverticulitis, perforation, vascular  I ordered, reviewed and interpreted labs, which included CBC normal, chemistries and LFTs normal, urinalysis without signs of infection, troponins flat I ordered imaging studies which included CT head, MRI brain, CT abdomen and pelvis and I independently    visualized and interpreted imaging which showed no acute stroke.  CT abdomen is pending at time of signout Additional history obtained from patient's daughters Previous records obtained and reviewed in epic including recent PCP notes I consulted neurology Dr. Bonnita Buttner and discussed lab and imaging findings and discussed disposition.  Cardiac monitoring reviewed, sinus rhythm Social determinants considered, physically inactive Critical Interventions: None  After the interventions stated above, I  reevaluated the patient and found patient to be awake alert neuro intact Admission and further testing considered, his care is signed out to Dr. Lewis Red to follow-up on results of CT abdomen.  As far as his numbness and speech goes, with no acute findings on imaging neurology is recommending outpatient follow-up.         Final Clinical Impression(s) / ED Diagnoses Final diagnoses:  Generalized abdominal pain  Paresthesia  Slurred speech    Rx / DC Orders ED Discharge Orders     None         Tonya Fredrickson, MD 06/08/23 1706

## 2023-06-08 NOTE — ED Notes (Signed)
 Patient ambulated to restroom independently.

## 2023-06-08 NOTE — ED Provider Triage Note (Signed)
 Emergency Medicine Provider Triage Evaluation Note  Robert Pacheco , a 89 y.o. male  was evaluated in triage.  Pt complains of nausea, feeling flushed, right-sided facial and arm numbness.  Began at 9:55 AM and lasted 5 minutes.  Resolved no current symptoms.  Not associated with chest pain or shortness of breath.  Review of Systems  Positive:  Negative:   Physical Exam  BP (!) 192/81   Pulse 65   Temp 97.9 F (36.6 C) (Oral)   Resp 18   SpO2 100%  Gen:   Awake, no distress   Resp:  Normal effort  MSK:   Moves extremities without difficulty  Other:  Van negative  Medical Decision Making  Medically screening exam initiated at 12:16 PM.  Appropriate orders placed.  Robert Pacheco was informed that the remainder of the evaluation will be completed by another provider, this initial triage assessment does not replace that evaluation, and the importance of remaining in the ED until their evaluation is complete.     Felicie Horning, PA-C 06/08/23 1217

## 2023-06-08 NOTE — Discharge Instructions (Signed)
 We saw you in the ER for pain, numbness. All the results in the ER are normal, labs and imaging. We are not sure what is causing your symptoms. The workup in the ER is not complete, and is limited to screening for life threatening and emergent conditions only, so please see a primary care doctor for further evaluation.

## 2023-06-08 NOTE — ED Notes (Signed)
 Patient transported to MRI

## 2023-06-09 ENCOUNTER — Encounter: Payer: Self-pay | Admitting: Neurology

## 2023-06-12 ENCOUNTER — Ambulatory Visit: Admitting: Family Medicine

## 2023-06-12 ENCOUNTER — Encounter: Payer: Self-pay | Admitting: Family Medicine

## 2023-06-12 VITALS — BP 128/64 | HR 74 | Temp 98.0°F | Resp 18 | Ht 67.0 in | Wt 178.8 lb

## 2023-06-12 DIAGNOSIS — I10 Essential (primary) hypertension: Secondary | ICD-10-CM | POA: Diagnosis not present

## 2023-06-12 DIAGNOSIS — K5900 Constipation, unspecified: Secondary | ICD-10-CM

## 2023-06-12 MED ORDER — METOPROLOL SUCCINATE ER 25 MG PO TB24
12.5000 mg | ORAL_TABLET | Freq: Every day | ORAL | 2 refills | Status: DC
Start: 2023-06-12 — End: 2023-12-23

## 2023-06-12 NOTE — Progress Notes (Signed)
 Chief Complaint  Patient presents with   Follow-up    Subjective Robert Pacheco is a 89 y.o. male who presents for hypertension follow up. He does monitor home blood pressures. Blood pressures ranging from 100's/70's on average. He is compliant with medication Toprol  XL 12.5 mg/d. Patient has these side effects of medication: none He is usually adhering to a healthy diet overall. Current exercise: some walking No CP or SOB.   Patient has been dealing with constipation over the past several months.  Has been taking Metamucil daily.  He takes it with Sprite.  Says he tries to stay well-hydrated.  No bleeding or tarry stools.  No nausea or vomiting.  Intermittent pain.   Past Medical History:  Diagnosis Date   Allergy    Coronary artery disease    Hyperlipidemia    Hypertension    Personal history of colonic adenomas 05/10/2012   Skin cancer, basal cell     Exam BP 128/64 (BP Location: Left Arm, Cuff Size: Normal)   Pulse 74   Temp 98 F (36.7 C)   Resp 18   Ht 5\' 7"  (1.702 m)   Wt 178 lb 12.8 oz (81.1 kg)   SpO2 98%   BMI 28.00 kg/m  General:  well developed, well nourished, in no apparent distress Heart: RRR, no bruits, 3+ pitting bilateral LE edema tapering at the mid tibia Lungs: clear to auscultation, no accessory muscle use Abdomen: Bowel sounds present, soft, nontender, nondistended Psych: well oriented with normal range of affect and appropriate judgment/insight  Essential hypertension  Constipation, unspecified constipation type  Chronic, stable.  Continue Toprol -XL 12.5 mg daily.  Counseled on diet and exercise. Chronic, not stable.  Continue Metamucil.  Consider 4 ounces of prune juice with 2 tablespoons of milk of magnesia for as needed use.  Increase hydration with water rather than other fluids. F/u as originally scheduled. The patient voiced understanding and agreement to the plan.  [name], DO 06/12/23  11:23 AM

## 2023-06-12 NOTE — Patient Instructions (Addendum)
 Stay hydrated.   Take Metamucil or Benefiber daily. Take with water please.   Try 2 tablespoons of milk of mag in 4 oz of warm prune juice. Do that and wait a couple hours. If no improvement, try a Dulcolax suppository and then let me know if we are still having issues.   Check your blood pressures 2-3 times per week, alternating the time of day you check it. If it is high, considering waiting 1-2 minutes and rechecking. If it gets higher, your anxiety is likely creeping up and we should avoid rechecking.   I want your blood pressure less than 140 on the top and less than 90 on the bottom consistently. Both goals must be met (ie, 150/70 is too high even though the 70 on the bottom is desirable).   Keep the diet clean and stay active.  Let us  know if you need anything.

## 2023-06-24 ENCOUNTER — Other Ambulatory Visit: Payer: Self-pay | Admitting: Family Medicine

## 2023-06-24 NOTE — Telephone Encounter (Unsigned)
 Copied from CRM [id number]. Topic: Clinical - Medication Refill >> Jun 24, 2023 12:21 PM Howard Macho wrote: Medication: meloxicam  (MOBIC ) 7.5 MG tablet  Has the patient contacted their pharmacy? No (Agent: If no, request that the patient contact the pharmacy for the refill. If patient does not wish to contact the pharmacy document the reason why and proceed with request.) (Agent: If yes, when and what did the pharmacy advise?)  This is the patient's preferred pharmacy:  [facility] [address] [place] - [address] [place] [address] Phone: [phone] Fax: [phone]  Is this the correct pharmacy for this prescription? Yes If no, delete pharmacy and type the correct one.   Has the prescription been filled recently? Yes  Is the patient out of the medication? Yes  Has the patient been seen for an appointment in the last year OR does the patient have an upcoming appointment? Yes  Can we respond through MyChart? No  Agent: Please be advised that Rx refills may take up to 3 business days. We ask that you follow-up with your pharmacy.

## 2023-06-25 MED ORDER — MELOXICAM 7.5 MG PO TABS: 7.5000 mg | ORAL_TABLET | Freq: Every day | ORAL | 0 refills | Status: DC

## 2023-06-29 ENCOUNTER — Ambulatory Visit: Admitting: Neurology

## 2023-06-29 ENCOUNTER — Encounter: Payer: Self-pay | Admitting: Neurology

## 2023-06-29 VITALS — BP 142/82 | HR 68 | Ht 67.0 in | Wt 167.0 lb

## 2023-06-29 DIAGNOSIS — R479 Unspecified speech disturbances: Secondary | ICD-10-CM | POA: Diagnosis not present

## 2023-06-29 DIAGNOSIS — R202 Paresthesia of skin: Secondary | ICD-10-CM | POA: Diagnosis not present

## 2023-06-29 NOTE — Progress Notes (Signed)
 [facility] Neurology Division Clinic Note - Initial Visit   Date: 06/29/2023   Robert Pacheco MRN: [id number] DOB: [birth year]   Dear Dr. Lewis Red:  Thank you for your kind referral of Robert Pacheco for consultation of slurred speech and right arm numbness. Although his history is well known to you, please allow us  to reiterate it for the purpose of our medical record. The patient was accompanied to the clinic by self.    Robert Pacheco is a 89 y.o. right-handed male with CAD s/p CABG, hypertension, and hyperlipidemia presenting for evaluation of slurred speech and right hand numbness.   IMPRESSION/PLAN: Transient dysarthria and right arm numbness.  No evidence of stroke on MRI brain.  I will check vessel imaging to look for stenosis with MRA head and neck as well as cardiac monitor. If this is normal, the next step is NCS/EMG of the right arm. He is on aspirin  81mg  and statin therapy.  Stroke warning signs were discussed.   Return to clinic in 3 months or sooner as needed  ------------------------------------------------------------- History of present illness: He was in the ER on 5/12 where he presented with abdominal pain, slurred speech, and right hand numbness.  Right arm numbness and slurred speech resolved within 5-10 minutes.  MRI brain did not reveal acute stroke.  He takes aspirin  81mg  and simvastatin  20mg  daily.  No history of TIA, stroke, diabetes, or tobacco use.   He reports doing well.  Today in the office, he developed similar numbness of the right hand.  He denies face or leg numbness/tingling.  No weakness in the right hand.    He lives alone.  He has four children, three of which live locally.     Out-side paper records, electronic medical record, and images have been reviewed where available and summarized as:  MRI brain wo contrast 06/08/2023: 1.  No evidence of an acute intracranial abnormality. 2. Moderate cerebral white matter chronic small vessel  ischemic disease. 3. Generalized parenchymal atrophy. 4. Left sphenoid sinusitis.   CT head wo contrast 06/08/2023:  IMPRESSION: 1. No acute intracranial abnormality. 2. Age related atrophy and chronic small vessel ischemia. 3. Chronic sphenoid sinusitis.    Past Medical History:  Diagnosis Date   Allergy    Coronary artery disease    Hyperlipidemia    Hypertension    Personal history of colonic adenomas 05/10/2012   Skin cancer, basal cell     Past Surgical History:  Procedure Laterality Date   CATARACT EXTRACTION     COLONOSCOPY  multiple   CORONARY ARTERY BYPASS GRAFT  2000   EYE SURGERY     HERNIA REPAIR     MOHS SURGERY       Medications:  Outpatient Encounter Medications as of 06/29/2023  Medication Sig   acetaminophen  (TYLENOL ) 500 MG tablet Take 500 mg by mouth every 6 (six) hours as needed for mild pain (pain score 1-3) or moderate pain (pain score 4-6).   aspirin  81 MG chewable tablet Chew 1 tablet (81 mg total) by mouth daily. (Patient taking differently: Chew 81 mg by mouth at bedtime.)   Loratadine (CLARITIN PO) Take 1 tablet by mouth as needed.   meloxicam  (MOBIC ) 7.5 MG tablet Take 1 tablet (7.5 mg total) by mouth daily.   metoprolol  succinate (TOPROL -XL) 25 MG 24 hr tablet Take 0.5 tablets (12.5 mg total) by mouth at bedtime.   NITROSTAT  0.4 MG SL tablet DISSOLVE ONE TABLET UNDER THE TONGUE EVERY 5 MINUTES AS  NEEDED.   Omega-3 Fatty Acids (FISH OIL) 1000 MG CAPS Take 1 capsule by mouth in the morning.   simvastatin  (ZOCOR ) 20 MG tablet Take 1 tablet (20 mg total) by mouth daily at 6 PM. (Patient taking differently: Take 20 mg by mouth at bedtime.)   No facility-administered encounter medications on file as of 06/29/2023.    Allergies:  Allergies  Allergen Reactions   Other Nausea Only    UNKNOWN PAIN MED    Family History: Family History  Problem Relation Age of Onset   Heart disease Father    Colon cancer Brother 19   Healthy Child        x 4    Healthy Grandchild        x 11   Colon cancer Sister 78   Prostate cancer Brother     Social History: Social History   Tobacco Use   Smoking status: Former   Smokeless tobacco: Never  Advertising account planner   Vaping status: Never Used  Substance Use Topics   Alcohol use: No   Drug use: No   Social History   Social History Narrative   Are you right handed or left handed? Right handed   Are you currently employed ? No    What is your current occupation? Retired   Do you live at home alone? Yes    Who lives with you?    What type of home do you live in: 1 story or 2 story? Lives in a one story home. Home has an attic         Vital Signs:  BP (!) 142/82   Pulse 68   Ht 5\' 7"  (1.702 m)   Wt 167 lb (75.8 kg)   SpO2 98%   BMI 26.16 kg/m    Neurological Exam: MENTAL STATUS including orientation to time, place, person, recent and remote memory, attention span and concentration, language, and fund of knowledge is normal.  Speech is not dysarthric.  CRANIAL NERVES: II:  No visual field defects.     III-IV-VI: Pupils equal round and reactive to light.  Normal conjugate, extra-ocular eye movements in all directions of gaze.  No nystagmus.  No ptosis.   V:  Normal facial sensation.    VII:  Normal facial symmetry and movements.   VIII:  Normal hearing and vestibular function.   IX-X:  Normal palatal movement.   XI:  Normal shoulder shrug and head rotation.   XII:  Normal tongue strength and range of motion, no deviation or fasciculation.  MOTOR:  No atrophy, fasciculations or abnormal movements.  No pronator drift.   Upper Extremity:  Right  Left  Deltoid  5/5   5/5   Biceps  5/5   5/5   Triceps  5/5   5/5   Wrist extensors  5/5   5/5   Wrist flexors  5/5   5/5   Finger extensors  5/5   5/5   Finger flexors  5/5   5/5   Dorsal interossei  5/5   5/5   Abductor pollicis  5/5   5/5   Tone (Ashworth scale)  0  0   Lower Extremity:  Right  Left  Hip flexors  5/5   5/5   Knee  flexors  5/5   5/5   Knee extensors  5/5   5/5   Dorsiflexors  5/5   5/5   Plantarflexors  5/5   5/5   Toe extensors  5/5  5/5   Toe flexors  5/5   5/5   Tone (Ashworth scale)  0  0   MSRs:                                           Right        Left brachioradialis 2+  2+  biceps 2+  2+  triceps 2+  2+  patellar 2+  2+  ankle jerk 1+  1+  plantar response down  down   SENSORY:  Normal and symmetric perception of light touch, pinprick, vibration, and proprioception.  Tinel's sign absent at the right wrist or elbow.    COORDINATION/GAIT: Normal finger-to- nose-finger.  Intact rapid alternating movements bilaterally.  Gait narrow based and stable, unassisted.   Thank you for allowing me to participate in patient's care.  If I can answer any additional questions, I would be pleased to do so.    Sincerely,    Maebel Marasco K. Lydia Sams, DO

## 2023-07-01 ENCOUNTER — Telehealth: Payer: Self-pay

## 2023-07-01 DIAGNOSIS — G459 Transient cerebral ischemic attack, unspecified: Secondary | ICD-10-CM

## 2023-07-01 NOTE — Telephone Encounter (Signed)
-----   Message from Daryel Ensign sent at 06/29/2023 11:51 AM EDT ----- Please order cardiac monitor. Let pt know that I want to check to be sure there is no irregular heart rhythm which could put him at risk for TIA/stroke, Thanks.

## 2023-07-01 NOTE — Telephone Encounter (Signed)
 Called patient and informed him that Dr. Lydia Sams would like to order a cardiac monitor for him to be sure there is no irregular heart rhythm to put him at risk of a TIA/Stroke. Patient stated he is ok with having this test done. Patient verbalized understanding and had no further questions or concerns.

## 2023-07-07 ENCOUNTER — Ambulatory Visit: Admitting: Neurology

## 2023-07-21 ENCOUNTER — Ambulatory Visit: Admitting: Dermatology

## 2023-08-03 ENCOUNTER — Other Ambulatory Visit

## 2023-08-06 ENCOUNTER — Other Ambulatory Visit: Payer: Self-pay

## 2023-08-06 ENCOUNTER — Emergency Department (HOSPITAL_BASED_OUTPATIENT_CLINIC_OR_DEPARTMENT_OTHER)
Admission: EM | Admit: 2023-08-06 | Discharge: 2023-08-06 | Disposition: A | Discharge status: Home / Self Care | Attending: Emergency Medicine | Admitting: Emergency Medicine

## 2023-08-06 ENCOUNTER — Ambulatory Visit: Admitting: Neurology

## 2023-08-06 DIAGNOSIS — I10 Essential (primary) hypertension: Secondary | ICD-10-CM | POA: Insufficient documentation

## 2023-08-06 DIAGNOSIS — Z7982 Long term (current) use of aspirin: Secondary | ICD-10-CM | POA: Insufficient documentation

## 2023-08-06 DIAGNOSIS — Z79899 Other long term (current) drug therapy: Secondary | ICD-10-CM | POA: Diagnosis not present

## 2023-08-06 DIAGNOSIS — R479 Unspecified speech disturbances: Secondary | ICD-10-CM

## 2023-08-06 DIAGNOSIS — G5601 Carpal tunnel syndrome, right upper limb: Secondary | ICD-10-CM | POA: Diagnosis not present

## 2023-08-06 DIAGNOSIS — W01190A Fall on same level from slipping, tripping and stumbling with subsequent striking against furniture, initial encounter: Secondary | ICD-10-CM | POA: Insufficient documentation

## 2023-08-06 DIAGNOSIS — S51812A Laceration without foreign body of left forearm, initial encounter: Secondary | ICD-10-CM | POA: Diagnosis present

## 2023-08-06 DIAGNOSIS — R202 Paresthesia of skin: Secondary | ICD-10-CM

## 2023-08-06 DIAGNOSIS — W19XXXA Unspecified fall, initial encounter: Secondary | ICD-10-CM

## 2023-08-06 DIAGNOSIS — I2510 Atherosclerotic heart disease of native coronary artery without angina pectoris: Secondary | ICD-10-CM | POA: Insufficient documentation

## 2023-08-06 NOTE — ED Triage Notes (Signed)
 Pt reports sliding in between bed and dresser. States the skin was torn on the dresser during fall.  Denies taking blood thinners.

## 2023-08-06 NOTE — Discharge Instructions (Addendum)
 You can leave the bandage on until tomorrow afternoon.  If it seems that the bandage is getting saturated with blood you can place new gauze and a fresh wrap as it will ooze today or you can put an Ace wrap around it for a little more compression.  Elevate your arm today.  Then you can change the bandage daily with the no stick bandage and then the dry bandages on top.  Try to keep the area dry for the next 2 to 3 days but then you can shower but do not scrub on the area.  When you follow-up with your doctor just make sure your tetanus shot is up-to-date.

## 2023-08-06 NOTE — ED Provider Notes (Signed)
 [facility] Provider Note   CSN: [id number] Arrival date & time: 08/06/23  9252     Patient presents with: Fall and Arm Injury   Robert Pacheco is a 89 y.o. male.   Patient is an 89 year old male with a history of hypertension, hyperlipidemia, CAD on aspirin  who is presenting today after he lost his balance when getting out of bed and falling to the ground.  However when he fell his arm got wedged between the bed and the nightstand and ripped the skin on his left forearm.  He denies hitting his head or any loss of consciousness.  He has had no difficulty ambulating since this occurred and denies any pain in his legs.  No numbness or tingling in his hands.  Tetanus shot is up-to-date.  The history is provided by the patient, a relative and medical records.  Fall  Arm Injury      Prior to Admission medications   Medication Sig Start Date End Date Taking? Authorizing Provider  acetaminophen  (TYLENOL ) 500 MG tablet Take 500 mg by mouth every 6 (six) hours as needed for mild pain (pain score 1-3) or moderate pain (pain score 4-6).    [provider]  aspirin  81 MG chewable tablet Chew 1 tablet (81 mg total) by mouth daily. Patient taking differently: Chew 81 mg by mouth at bedtime. 09/10/20   Tobb, Kardie, DO  Loratadine (CLARITIN PO) Take 1 tablet by mouth as needed.    [provider]  meloxicam  (MOBIC ) 7.5 MG tablet Take 1 tablet (7.5 mg total) by mouth daily. 06/25/23   [name], DO  metoprolol  succinate (TOPROL -XL) 25 MG 24 hr tablet Take 0.5 tablets (12.5 mg total) by mouth at bedtime. 06/12/23   [name], DO  NITROSTAT  0.4 MG SL tablet DISSOLVE ONE TABLET UNDER THE TONGUE EVERY 5 MINUTES AS NEEDED. 10/01/21   [name], DO  Omega-3 Fatty Acids (FISH OIL) 1000 MG CAPS Take 1 capsule by mouth in the morning.    [provider]  simvastatin  (ZOCOR ) 20 MG tablet Take 1 tablet  (20 mg total) by mouth daily at 6 PM. Patient taking differently: Take 20 mg by mouth at bedtime. 02/02/23   [name], DO    Allergies: Other    Review of Systems  Updated Vital Signs BP (!) 182/69 (BP Location: Right Arm)   Pulse 72   Temp 97.9 F (36.6 C)   Resp 18   SpO2 100%   Physical Exam Vitals and nursing note reviewed.  HENT:     Head: Normocephalic.  Cardiovascular:     Rate and Rhythm: Normal rate.  Pulmonary:     Effort: Pulmonary effort is normal.  Musculoskeletal:     Cervical back: Normal range of motion.     Comments: Large superficial skin tear noted over a majority of the dorsal left forearm with some mild oozing blood at the distal portion.  No deep muscular injury.  Patient has normal flexion extension at the left wrist, normal grip strength and sensation.  Normal flexion extension of the arm.  Skin:    General: Skin is warm.  Neurological:     Mental Status: He is alert. Mental status is at baseline.     Gait: Gait normal.     (all labs ordered are listed, but only abnormal results are displayed) Labs Reviewed - No data to display  EKG: None  Radiology: No results  found.   Procedures   Medications Ordered in the ED - No data to display                                  Medical Decision Making  Patient presenting with a large skin tear of his left forearm.  This occurred when he lost his balance when trying to turn the fan off when he was getting out of bed.  Tetanus shot is up-to-date.  No other signs of injury.  Low suspicion for underlying acute medical concern.  Wound repaired with Steri-Strips and a compressive bandage.  Will need to heal by secondary intention.  No deeper tissue injury.  Patient and his family member given instructions for wound care and follow-up with PCP.     Final diagnoses:  Fall, initial encounter  Skin tear of left forearm without complication, initial encounter    ED Discharge Orders      None          Doretha Folks, MD 08/06/23 [phone]

## 2023-08-06 NOTE — Procedures (Signed)
  [facility]  [address] [place], [address]  [place], [place] [address] Tel: [phone] Fax: [phone] Test Date:  08/06/2023  Patient: Robert Pacheco DOB: 02-Jul-1933 Physician: Tonita Blanch, DO  Sex: Male Height: 5' 7 Ref Phys: Tonita Blanch, DO  ID#: [id number]   Technician:    History: This is a 89 year old man referred for evaluation of right hand numbness.  NCV & EMG Findings: Extensive electrodiagnostic testing of the right upper extremity shows:  Right median sensory response shows prolonged latency (4.2 ms) and reduced amplitude (5.2 V).  Right ulnar sensory response is within normal limits.  Right median and ulnar motor responses are within normal limits.  There is no evidence of active or chronic motor axonal loss changes affecting any of the tested muscles.  Motor unit configuration and recruitment pattern is within normal limits.    Impression: Right median neuropathy at or distal to the wrist, consistent with a clinical diagnosis of carpal tunnel syndrome.  Overall, these findings are moderate in degree electrically.   ___________________________ Tonita Blanch, DO    Nerve Conduction Studies   Stim Site NR Peak (ms) Norm Peak (ms) O-P Amp (V) Norm O-P Amp  Right Median Anti Sensory (2nd Digit)  32 C  Wrist    *4.2 <3.8 *5.2 >10  Right Ulnar Anti Sensory (5th Digit)  32 C  Wrist    2.8 <3.2 16.9 >5     Stim Site NR Onset (ms) Norm Onset (ms) O-P Amp (mV) Norm O-P Amp Site1 Site2 Delta-0 (ms) Dist (cm) Vel (m/s) Norm Vel (m/s)  Right Median Motor (Abd Poll Brev)  32 C  Wrist    3.8 <4.0 5.9 >5 Elbow Wrist 6.2 31.0 50 >50  Elbow    10.0  5.0         Right Ulnar Motor (Abd Dig Minimi)  32 C  Wrist    2.6 <3.1 10.6 >7 B Elbow Wrist 4.4 22.0 50 >50  B Elbow    7.0  10.5  A Elbow B Elbow 1.8 10.0 56 >50  A Elbow    8.8  10.1          Electromyography   Side Muscle Ins.Act Fibs Fasc Recrt Amp Dur Poly Activation Comment  Right 1stDorInt Nml Nml  Nml Nml Nml Nml Nml Nml N/A  Right Abd Poll Brev Nml Nml Nml Nml Nml Nml Nml Nml N/A  Right PronatorTeres Nml Nml Nml Nml Nml Nml Nml Nml N/A  Right Biceps Nml Nml Nml Nml Nml Nml Nml Nml N/A  Right Triceps Nml Nml Nml Nml Nml Nml Nml Nml N/A  Right Deltoid Nml Nml Nml Nml Nml Nml Nml Nml N/A      Waveforms:

## 2023-08-07 ENCOUNTER — Ambulatory Visit: Payer: Self-pay | Admitting: Neurology

## 2023-08-07 ENCOUNTER — Ambulatory Visit: Attending: Neurology

## 2023-08-07 DIAGNOSIS — G459 Transient cerebral ischemic attack, unspecified: Secondary | ICD-10-CM

## 2023-08-09 DIAGNOSIS — G459 Transient cerebral ischemic attack, unspecified: Secondary | ICD-10-CM | POA: Diagnosis not present

## 2023-08-10 ENCOUNTER — Ambulatory Visit: Payer: Self-pay | Admitting: Neurology

## 2023-08-10 ENCOUNTER — Telehealth: Payer: Self-pay | Admitting: Neurology

## 2023-08-10 NOTE — Telephone Encounter (Signed)
 They have questions about the follow up care  such a cooper Bracelet and imaging MRI   Please call

## 2023-08-11 ENCOUNTER — Other Ambulatory Visit: Payer: Self-pay | Admitting: Family Medicine

## 2023-08-11 DIAGNOSIS — I25701 Atherosclerosis of coronary artery bypass graft(s), unspecified, with angina pectoris with documented spasm: Secondary | ICD-10-CM

## 2023-08-11 NOTE — Telephone Encounter (Signed)
 Copied from CRM [id number]. Topic: Clinical - Medication Refill >> Aug 11, 2023  3:18 PM [place] M wrote: Medication: simvastatin  (ZOCOR ) 20 MG tablet  Has the patient contacted their pharmacy? Yes (Agent: If no, request that the patient contact the pharmacy for the refill. If patient does not wish to contact the pharmacy document the reason why and proceed with request.) (Agent: If yes, when and what did the pharmacy advise?)  This is the patient's preferred pharmacy:  [facility] [address], [place] - [address] [place] [address] Phone: [phone] Fax: [phone]  Is this the correct pharmacy for this prescription? Yes If no, delete pharmacy and type the correct one.   Has the prescription been filled recently? Yes  Is the patient out of the medication? Yes  Has the patient been seen for an appointment in the last year OR does the patient have an upcoming appointment? Yes  Can we respond through MyChart? Yes  Agent: Please be advised that Rx refills may take up to 3 business days. We ask that you follow-up with your pharmacy.

## 2023-08-12 ENCOUNTER — Ambulatory Visit: Admitting: Family Medicine

## 2023-08-12 ENCOUNTER — Encounter: Payer: Self-pay | Admitting: Family Medicine

## 2023-08-12 VITALS — BP 126/74 | HR 82 | Temp 97.8°F | Resp 16 | Ht 67.0 in | Wt 183.8 lb

## 2023-08-12 DIAGNOSIS — M25511 Pain in right shoulder: Secondary | ICD-10-CM

## 2023-08-12 DIAGNOSIS — T148XXA Other injury of unspecified body region, initial encounter: Secondary | ICD-10-CM | POA: Diagnosis not present

## 2023-08-12 NOTE — Progress Notes (Signed)
 Chief Complaint  Patient presents with   Follow-up    ER Follow Up    Robert Pacheco is a 89 y.o. male here for a skin complaint.  Duration: 1 week Location: L forearm Pruritic? No Painful? Yes Drainage? No New soaps/lotions/topicals/detergents? No Trauma? Yes- fell after getting out of bed Other associated symptoms: bruising; no fevers or spreading redness Therapies tried thus far: none  R shoulder pain This has been going on for 1 month.  No injury or change in activity.  Is not improving.  Denies any bruising, redness, swelling, decreased range of motion.  Pain over the top of the shoulder.  Achy in nature.  Has not tried any thing so far at home.  Past Medical History:  Diagnosis Date   Allergy    Coronary artery disease    Hyperlipidemia    Hypertension    Personal history of colonic adenomas 05/10/2012   Skin cancer, basal cell     BP 126/74 (BP Location: Left Arm, Patient Position: Sitting)   Pulse 82   Temp 97.8 F (36.6 C) (Oral)   Resp 16   Ht 5' 7 (1.702 m)   Wt 183 lb 12.8 oz (83.4 kg)   SpO2 98%   BMI 28.79 kg/m  Gen: awake, alert, appearing stated age Lungs: No accessory muscle use Skin: See below. No drainage, erythema, fluctuance MSK: Normal active and passive range of motion of the right shoulder.  No TTP.  Positive empty can on the right, negative Neer's, Hawkins, speeds, crossover, lift off Psych: Age appropriate judgment and insight    Skin abrasion  Acute pain of right shoulder  Reassurance.  Keep area clean and dry.  Triple antibiotic ointment twice daily.  No signs of infection today.  Discussed what to look out for and wrote these down.  His forearm was dressed prior to leaving. Stretches and exercises provided.  Heat, ice, Tylenol .  Consider injection versus physical therapy if no improvement in the next month. F/u prn. The patient voiced understanding and agreement to the plan.  [name], DO 08/12/23 12:29 PM

## 2023-08-12 NOTE — Patient Instructions (Signed)
 Ice/cold pack over area for 10-15 min twice daily.  OK to take Tylenol  1000 mg (2 extra strength tabs) or 975 mg (3 regular strength tabs) every 6 hours as needed.  When you do wash it, use only soap and water. Do not vigorously scrub. Apply triple antibiotic ointment (like Neosporin) twice daily. Keep the area clean and dry.   Things to look out for: increasing pain not relieved by acetaminophen , fevers, spreading redness, drainage of pus, or foul odor.  Send me a message in 1 month if shoulder is not getting better and we will consider physical therapy or a cortisone injection in there.   Let us  know if you need anything.  EXERCISES  RANGE OF MOTION (ROM) AND STRETCHING EXERCISES These exercises may help you when beginning to rehabilitate your injury. While completing these exercises, remember:  Restoring tissue flexibility helps normal motion to return to the joints. This allows healthier, less painful movement and activity. An effective stretch should be held for at least 30 seconds. A stretch should never be painful. You should only feel a gentle lengthening or release in the stretched tissue.  ROM - Pendulum Bend at the waist so that your right / left arm falls away from your body. Support yourself with your opposite hand on a solid surface, such as a table or a countertop. Your right / left arm should be perpendicular to the ground. If it is not perpendicular, you need to lean over farther. Relax the muscles in your right / left arm and shoulder as much as possible. Gently sway your hips and trunk so they move your right / left arm without any use of your right / left shoulder muscles. Progress your movements so that your right / left arm moves side to side, then forward and backward, and finally, both clockwise and counterclockwise. Complete 10-15 repetitions in each direction. Many people use this exercise to relieve discomfort in their shoulder as well as to gain range of  motion. Repeat 2 times. Complete this exercise 3 times per week.  STRETCH - Flexion, Standing Stand with good posture. With an underhand grip on your right / left hand and an overhand grip on the opposite hand, grasp a broomstick or cane so that your hands are a little more than shoulder-width apart. Keeping your right / left elbow straight and shoulder muscles relaxed, push the stick with your opposite hand to raise your right / left arm in front of your body and then overhead. Raise your arm until you feel a stretch in your right / left shoulder, but before you have increased shoulder pain. Try to avoid shrugging your right / left shoulder as your arm rises by keeping your shoulder blade tucked down and toward your mid-back spine. Hold 30 seconds. Slowly return to the starting position. Repeat 2 times. Complete this exercise 3 times per week.  STRETCH - Internal Rotation Place your right / left hand behind your back, palm-up. Throw a towel or belt over your opposite shoulder. Grasp the towel/belt with your right / left hand. While keeping an upright posture, gently pull up on the towel/belt until you feel a stretch in the front of your right / left shoulder. Avoid shrugging your right / left shoulder as your arm rises by keeping your shoulder blade tucked down and toward your mid-back spine. Hold 30. Release the stretch by lowering your opposite hand. Repeat 2 times. Complete this exercise 3 times per week.  STRETCH - External Rotation and  Abduction Stagger your stance through a doorframe. It does not matter which foot is forward. As instructed by your physician, physical therapist or athletic trainer, place your hands: And forearms above your head and on the door frame. And forearms at head-height and on the door frame. At elbow-height and on the door frame. Keeping your head and chest upright and your stomach muscles tight to prevent over-extending your low-back, slowly shift your weight  onto your front foot until you feel a stretch across your chest and/or in the front of your shoulders. Hold 30 seconds. Shift your weight to your back foot to release the stretch. Repeat 2 times. Complete this stretch 3 times per week.   STRENGTHENING EXERCISES  These exercises may help you when beginning to rehabilitate your injury. They may resolve your symptoms with or without further involvement from your physician, physical therapist or athletic trainer. While completing these exercises, remember:  Muscles can gain both the endurance and the strength needed for everyday activities through controlled exercises. Complete these exercises as instructed by your physician, physical therapist or athletic trainer. Progress the resistance and repetitions only as guided. You may experience muscle soreness or fatigue, but the pain or discomfort you are trying to eliminate should never worsen during these exercises. If this pain does worsen, stop and make certain you are following the directions exactly. If the pain is still present after adjustments, discontinue the exercise until you can discuss the trouble with your clinician. If advised by your physician, during your recovery, avoid activity or exercises which involve actions that place your right / left hand or elbow above your head or behind your back or head. These positions stress the tissues which are trying to heal.  STRENGTH - Scapular Depression and Adduction With good posture, sit on a firm chair. Supported your arms in front of you with pillows, arm rests or a table top. Have your elbows in line with the sides of your body. Gently draw your shoulder blades down and toward your mid-back spine. Gradually increase the tension without tensing the muscles along the top of your shoulders and the back of your neck. Hold for 3 seconds. Slowly release the tension and relax your muscles completely before completing the next repetition. After you have  practiced this exercise, remove the arm support and complete it in standing as well as sitting. Repeat 2 times. Complete this exercise 3 times per week.   STRENGTH - External Rotators Secure a rubber exercise band/tubing to a fixed object so that it is at the same height as your right / left elbow when you are standing or sitting on a firm surface. Stand or sit so that the secured exercise band/tubing is at your side that is not injured. Bend your elbow 90 degrees. Place a folded towel or small pillow under your right / left arm so that your elbow is a few inches away from your side. Keeping the tension on the exercise band/tubing, pull it away from your body, as if pivoting on your elbow. Be sure to keep your body steady so that the movement is only coming from your shoulder rotating. Hold 3 seconds. Release the tension in a controlled manner as you return to the starting position. Repeat 2 times. Complete this exercise 3 times per week.   STRENGTH - Supraspinatus Stand or sit with good posture. Grasp a 2-3 lb weight or an exercise band/tubing so that your hand is thumbs-up, like when you shake hands. Slowly lift your  right / left hand from your thigh into the air, traveling about 30 degrees from straight out at your side. Lift your hand to shoulder height or as far as you can without increasing any shoulder pain. Initially, many people do not lift their hands above shoulder height. Avoid shrugging your right / left shoulder as your arm rises by keeping your shoulder blade tucked down and toward your mid-back spine. Hold for 3 seconds. Control the descent of your hand as you slowly return to your starting position. Repeat 2 times. Complete this exercise 3 times per week.   STRENGTH - Shoulder Extensors Secure a rubber exercise band/tubing so that it is at the height of your shoulders when you are either standing or sitting on a firm arm-less chair. With a thumbs-up grip, grasp an end of the  band/tubing in each hand. Straighten your elbows and lift your hands straight in front of you at shoulder height. Step back away from the secured end of band/tubing until it becomes tense. Squeezing your shoulder blades together, pull your hands down to the sides of your thighs. Do not allow your hands to go behind you. Hold for 3 seconds. Slowly ease the tension on the band/tubing as you reverse the directions and return to the starting position. Repeat 2 times. Complete this exercise 3 times per week.   STRENGTH - Scapular Retractors Secure a rubber exercise band/tubing so that it is at the height of your shoulders when you are either standing or sitting on a firm arm-less chair. With a palm-down grip, grasp an end of the band/tubing in each hand. Straighten your elbows and lift your hands straight in front of you at shoulder height. Step back away from the secured end of band/tubing until it becomes tense. Squeezing your shoulder blades together, draw your elbows back as you bend them. Keep your upper arm lifted away from your body throughout the exercise. Hold 3 seconds. Slowly ease the tension on the band/tubing as you reverse the directions and return to the starting position. Repeat 2 times. Complete this exercise 3 times per week.  STRENGTH - Scapular Depressors Find a sturdy chair without wheels, such as a from a dining room table. Keeping your feet on the floor, lift your bottom from the seat and lock your elbows. Keeping your elbows straight, allow gravity to pull your body weight down. Your shoulders will rise toward your ears. Raise your body against gravity by drawing your shoulder blades down your back, shortening the distance between your shoulders and ears. Although your feet should always maintain contact with the floor, your feet should progressively support less body weight as you get stronger. Hold 3 seconds. In a controlled and slow manner, lower your body weight to begin the  next repetition. Repeat 2 times. Complete this exercise 3 times per week.    This information is not intended to replace advice given to you by your health care provider. Make sure you discuss any questions you have with your health care provider.   Document Released: 11/27/2004 Document Revised: 02/03/2014 Document Reviewed: 04/27/2008 Elsevier Interactive Patient Education [employer]

## 2023-08-13 NOTE — Telephone Encounter (Signed)
 Called Pt  and walked him through the way to get a waist brace at [facility] [address] or drug store , He understood and all concern were addressed.

## 2023-09-11 ENCOUNTER — Ambulatory Visit
Admission: RE | Admit: 2023-09-11 | Discharge: 2023-09-11 | Disposition: A | Discharge status: Home / Self Care | Source: Ambulatory Visit | Attending: Neurology | Admitting: Neurology

## 2023-09-11 DIAGNOSIS — R479 Unspecified speech disturbances: Secondary | ICD-10-CM

## 2023-09-11 DIAGNOSIS — R202 Paresthesia of skin: Secondary | ICD-10-CM

## 2023-09-11 MED ORDER — GADOPICLENOL 0.5 MMOL/ML IV SOLN
8.0000 mL | Freq: Once | INTRAVENOUS | Status: AC | PRN
  Administered 2023-09-11: 8 mL via INTRAVENOUS

## 2023-09-21 ENCOUNTER — Ambulatory Visit: Admitting: Family Medicine

## 2023-09-21 ENCOUNTER — Encounter: Payer: Self-pay | Admitting: Family Medicine

## 2023-09-21 VITALS — BP 130/70 | HR 67 | Temp 98.0°F | Resp 16 | Ht 67.0 in | Wt 186.0 lb

## 2023-09-21 DIAGNOSIS — G8929 Other chronic pain: Secondary | ICD-10-CM

## 2023-09-21 DIAGNOSIS — M25511 Pain in right shoulder: Secondary | ICD-10-CM | POA: Diagnosis not present

## 2023-09-21 DIAGNOSIS — I25701 Atherosclerosis of coronary artery bypass graft(s), unspecified, with angina pectoris with documented spasm: Secondary | ICD-10-CM | POA: Diagnosis not present

## 2023-09-21 MED ORDER — MELOXICAM 7.5 MG PO TABS: 7.5000 mg | ORAL_TABLET | Freq: Every day | ORAL | 0 refills | Status: DC

## 2023-09-21 MED ORDER — METHYLPREDNISOLONE ACETATE 40 MG/ML IJ SUSP
40.0000 mg | Freq: Once | INTRAMUSCULAR | Status: AC
  Administered 2023-09-21: 40 mg via INTRAMUSCULAR

## 2023-09-21 MED ORDER — NITROSTAT 0.4 MG SL SUBL: SUBLINGUAL_TABLET | SUBLINGUAL | 1 refills | Status: AC

## 2023-09-21 NOTE — Patient Instructions (Signed)
 Try to do the stretches.  Ice/cold pack over area for 10-15 min twice daily.  Heat (pad or rice pillow in microwave) over affected area, 10-15 minutes twice daily.   OK to take Tylenol  1000 mg (2 extra strength tabs) or 975 mg (3 regular strength tabs) every 6 hours as needed.  Let me know if you aren't turning the corner and we can get you in with the physical therapy team.   Let us  know if you need anything.

## 2023-09-21 NOTE — Progress Notes (Signed)
 Musculoskeletal Exam  Patient: Robert Pacheco DOB: 1933/05/19  DOS: 09/21/2023  SUBJECTIVE:  Chief Complaint:   Chief Complaint  Patient presents with   Shoulder Pain    Shoulder Pain    Robert Pacheco is a 89 y.o.  male for evaluation and treatment of  R shoulder pain.   Onset:  2.5 months ago. No inj or change in activity.  Location: Top of R shoulder Character:  aching  Progression of issue:  is unchanged Associated symptoms: decreased ROM.  Treatment: to date has been rest, acetaminophen , and heat; he was given stretches/exercises but did not do them.   Neurovascular symptoms: no  Past Medical History:  Diagnosis Date   Allergy    Coronary artery disease    Hyperlipidemia    Hypertension    Personal history of colonic adenomas 05/10/2012   Skin cancer, basal cell     Objective: VITAL SIGNS: BP 130/70 (BP Location: Left Arm, Patient Position: Sitting)   Pulse 67   Temp 98 F (36.7 C) (Oral)   Resp 16   Ht 5' 7 (1.702 m)   Wt 186 lb (84.4 kg)   SpO2 99%   BMI 29.13 kg/m  Constitutional: Well formed, well developed. No acute distress. Thorax & Lungs: No accessory muscle use Musculoskeletal: R shoulder.   Normal active range of motion: no.  Decreased forward flexion. Normal passive range of motion: no. Decreased forward flexion.  Tenderness to palpation: no Deformity: no Ecchymosis: no Tests positive: Empty can Tests negative: Neer's, Hawkins, cross over, lift off, Speed's Neurologic: Normal sensory function. No focal deficits noted. DTR's equal and symmetric in LE's. No clonus. Psychiatric: Normal mood. Age appropriate judgment and insight. Alert & oriented x 3.    Procedure Note; Shoulder joint injection Informed consent obtained. The area was palpated, an area was marked posterior to the acromion process approximately 2 cm inferiorly, and cleaned with alcohol x1. A 27-gauge needle, while aiming towards the coracoid process, was used to enter the joint  posteriorally with ease. 40 mg of Depomedrol with 2 mL of 1% lidocaine  was injected. The patient tolerated the procedure well. There were no complications noted.  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 Stretches/exercises, heat, ice, Tylenol . Injection today. Mobic  7.5 mg daily as needed. Consider PT if no better.  F/u as originally scheduled. The patient voiced understanding and agreement to the plan.   [name] [employer], DO 09/21/23  11:00 AM

## 2023-09-24 ENCOUNTER — Telehealth: Payer: Self-pay | Admitting: Neurology

## 2023-09-24 NOTE — Telephone Encounter (Signed)
 Pt 's daughter called and stated that her father would like the results from the heart monitor he wore  for 30 days. Thanks

## 2023-09-24 NOTE — Telephone Encounter (Signed)
 Patient daughter Robert Pacheco was advised of Heart monitor results and EMG.   Cock up wrist splint for carpal tunnel symptoms can be bought in local drug stores or online. This should especially be worn at night while sleeping.

## 2023-10-12 ENCOUNTER — Encounter: Payer: Self-pay | Admitting: Family Medicine

## 2023-10-12 ENCOUNTER — Ambulatory Visit: Payer: Self-pay | Admitting: Family Medicine

## 2023-10-12 ENCOUNTER — Ambulatory Visit: Admitting: Family Medicine

## 2023-10-12 VITALS — BP 128/76 | HR 76 | Temp 98.0°F | Resp 16 | Ht 67.0 in | Wt 184.0 lb

## 2023-10-12 DIAGNOSIS — Z0000 Encounter for general adult medical examination without abnormal findings: Secondary | ICD-10-CM

## 2023-10-12 DIAGNOSIS — I10 Essential (primary) hypertension: Secondary | ICD-10-CM | POA: Diagnosis not present

## 2023-10-12 DIAGNOSIS — T50905A Adverse effect of unspecified drugs, medicaments and biological substances, initial encounter: Secondary | ICD-10-CM | POA: Diagnosis not present

## 2023-10-12 LAB — CBC
HCT: 41.1 % (ref 39.0–52.0)
Hemoglobin: 13.8 g/dL (ref 13.0–17.0)
MCHC: 33.5 g/dL (ref 30.0–36.0)
MCV: 93.1 fl (ref 78.0–100.0)
Platelets: 221 K/uL (ref 150.0–400.0)
RBC: 4.42 Mil/uL (ref 4.22–5.81)
RDW: 13.5 % (ref 11.5–15.5)
WBC: 5.2 K/uL (ref 4.0–10.5)

## 2023-10-12 LAB — COMPREHENSIVE METABOLIC PANEL WITH GFR
ALT: 8 U/L (ref 0–53)
AST: 13 U/L (ref 0–37)
Albumin: 4.2 g/dL (ref 3.5–5.2)
Alkaline Phosphatase: 74 U/L (ref 39–117)
BUN: 20 mg/dL (ref 6–23)
CO2: 27 meq/L (ref 19–32)
Calcium: 9.3 mg/dL (ref 8.4–10.5)
Chloride: 108 meq/L (ref 96–112)
Creatinine, Ser: 1.01 mg/dL (ref 0.40–1.50)
GFR: 66.44 mL/min (ref >60.00)
Glucose, Bld: 109 mg/dL — ABNORMAL HIGH (ref 70–99)
Potassium: 4.1 meq/L (ref 3.5–5.1)
Sodium: 142 meq/L (ref 135–145)
Total Bilirubin: 0.8 mg/dL (ref 0.2–1.2)
Total Protein: 6.4 g/dL (ref 6.0–8.3)

## 2023-10-12 LAB — LIPID PANEL
Cholesterol: 108 mg/dL (ref 0–200)
HDL: 37.2 mg/dL — ABNORMAL LOW (ref >39.00)
LDL Cholesterol: 47 mg/dL (ref 0–99)
NonHDL: 70.8
Total CHOL/HDL Ratio: 3
Triglycerides: 118 mg/dL (ref 0.0–149.0)
VLDL: 23.6 mg/dL (ref 0.0–40.0)

## 2023-10-12 NOTE — Progress Notes (Signed)
 No chief complaint on file.   Well Male Robert Pacheco is here for a complete physical.   His last physical was >1 year ago.  Current diet: in general, a healthy diet.   Current exercise: some walking Weight trend: stable Fatigue out of ordinary? No. Seat belt? Yes.   Advanced directive? No  Health maintenance Shingrix- No Tetanus- Yes Pneumonia vaccine- Yes  Over the past months, he has been having increasing fatigue.  He eats fairly well overall and stays pretty active.  Mood is stable.  He feels pretty well rested in the morning.  He is on Toprol  12.5 mg daily.  Compliant with this.  Past Medical History:  Diagnosis Date   Allergy    Coronary artery disease    Hyperlipidemia    Hypertension    Personal history of colonic adenomas 05/10/2012   Skin cancer, basal cell      Past Surgical History:  Procedure Laterality Date   CATARACT EXTRACTION     COLONOSCOPY  multiple   CORONARY ARTERY BYPASS GRAFT  2000   EYE SURGERY     HERNIA REPAIR     MOHS SURGERY      Medications  Current Outpatient Medications on File Prior to Visit  Medication Sig Dispense Refill   acetaminophen  (TYLENOL ) 500 MG tablet Take 500 mg by mouth every 6 (six) hours as needed for mild pain (pain score 1-3) or moderate pain (pain score 4-6).     aspirin  81 MG chewable tablet Chew 1 tablet (81 mg total) by mouth daily. (Patient taking differently: Chew 81 mg by mouth at bedtime.) 90 tablet 3   Loratadine (CLARITIN PO) Take 1 tablet by mouth as needed.     meloxicam  (MOBIC ) 7.5 MG tablet Take 1 tablet (7.5 mg total) by mouth daily. 30 tablet 0   metoprolol  succinate (TOPROL -XL) 25 MG 24 hr tablet Take 0.5 tablets (12.5 mg total) by mouth at bedtime. 45 tablet 2   NITROSTAT  0.4 MG SL tablet DISSOLVE ONE TABLET UNDER THE TONGUE EVERY 5 MINUTES AS NEEDED. 25 tablet 1   Omega-3 Fatty Acids (FISH OIL) 1000 MG CAPS Take 1 capsule by mouth in the morning.     simvastatin  (ZOCOR ) 20 MG tablet Take 1 tablet  (20 mg total) by mouth daily at 6 PM. 90 tablet 0   Allergies Allergies  Allergen Reactions   Other Nausea Only    UNKNOWN PAIN MED    Family History Family History  Problem Relation Age of Onset   Heart disease Father    Colon cancer Brother 80   Healthy Child        x 4   Healthy Grandchild        x 11   Colon cancer Sister 83   Prostate cancer Brother     Review of Systems: Constitutional:  no fevers Eye:  no recent significant change in vision Ears:  No changes in hearing Nose/Mouth/Throat:  no complaints of nasal congestion, no sore throat Cardiovascular: no chest pain Respiratory:  No shortness of breath Gastrointestinal:  No change in bowel habits GU:  +nighttime frequency Integumentary:  no abnormal skin lesions reported Neurologic:  no headaches Endocrine:  denies unexplained weight changes  Exam BP 128/76 (BP Location: Left Arm, Patient Position: Sitting)   Pulse 76   Temp 98 F (36.7 C) (Oral)   Resp 16   Ht 5' 7 (1.702 m)   Wt 184 lb (83.5 kg)   SpO2 98%  BMI 28.82 kg/m  General:  well developed, well nourished, in no apparent distress Skin:  no significant moles, warts, or growths Head:  no masses, lesions, or tenderness Eyes:  pupils equal and round, sclera anicteric without injection Ears:  canals without lesions, TMs shiny without retraction, no obvious effusion, no erythema Nose:  nares patent, mucosa normal Throat/Pharynx:  lips and gingiva without lesion; tongue and uvula midline; non-inflamed pharynx; no exudates or postnasal drainage Lungs:  clear to auscultation, breath sounds equal bilaterally, no respiratory distress Cardio:  regular rate and rhythm, no LE edema or bruits Rectal: Deferred GI: BS+, S, NT, ND, no masses or organomegaly Musculoskeletal:  symmetrical muscle groups noted without atrophy or deformity Neuro:  gait normal; deep tendon reflexes normal and symmetric Psych: well oriented with normal range of affect and  appropriate judgment/insight  Assessment and Plan  Well adult exam  Essential hypertension - Plan: CBC, Comprehensive metabolic panel with GFR, Lipid panel  Adverse effect of drug, initial encounter   Well 89 y.o. male. Counseled on diet and exercise. Advanced directive form provided today.  Flu shot for mid Oct. Fatigue/adverse effect of medication: Hold Toprol  for the next 3 weeks and send a message/call with an update.  Will rule out metabolic contributing factors today.  Stay hydrated. Other orders as above. Follow up in 6 mo.  The patient voiced understanding and agreement to the plan.  [name], DO 10/12/23 12:01 PM

## 2023-10-12 NOTE — Patient Instructions (Addendum)
 Give us  2-3 business days to get the results of your labs back.   Keep the diet clean and stay active.  Stop the metoprolol  for 3 weeks and then call to update up with how you are doing.   Please get me a copy of your advanced directive form at your convenience.   Let us  know if you need anything.

## 2023-10-27 ENCOUNTER — Encounter: Payer: Self-pay | Admitting: Neurology

## 2023-10-27 ENCOUNTER — Ambulatory Visit: Admitting: Neurology

## 2023-10-27 VITALS — BP 150/76 | HR 63 | Ht 67.0 in | Wt 184.0 lb

## 2023-10-27 DIAGNOSIS — G459 Transient cerebral ischemic attack, unspecified: Secondary | ICD-10-CM | POA: Diagnosis not present

## 2023-10-27 DIAGNOSIS — G5601 Carpal tunnel syndrome, right upper limb: Secondary | ICD-10-CM

## 2023-10-27 NOTE — Progress Notes (Signed)
 Follow-up Visit   Date: 10/27/2023    Robert Pacheco MRN: [id number] DOB: 1933/11/15    Robert Pacheco is a 89 y.o.  right-handed male with CAD s/p CABG, hypertension, and hyperlipidemia returning to the clinic for follow-up of right CTS and TIA.  The patient was accompanied to the clinic by self.   IMPRESSION/PLAN: TIA manifesting with transient dysarthria and right arm numbness (May 2025).  MRI brain without stroke.  MRA head and neck did not show large vessel occlusion.  Cardiac monitor is negative.   - Continue aspirin  81mg  and statin therapy - BP is elevated, monitor at home.   Right carpal tunnel syndrome, moderate.  He denies any ongoing numbness/tingling.  - If symptoms develop, he can use wrist brace as needed  Return to clinic as needed  --------------------------------------------- History of present illness: He was in the ER on 5/12 where he presented with abdominal pain, slurred speech, and right hand numbness.  Right arm numbness and slurred speech resolved within 5-10 minutes.  MRI brain did not reveal acute stroke.  He takes aspirin  81mg  and simvastatin  20mg  daily.  No history of TIA, stroke, diabetes, or tobacco use.    He reports doing well.  Today in the office, he developed similar numbness of the right hand.  He denies face or leg numbness/tingling.  No weakness in the right hand.     He lives alone.  He has four children, three of which live locally.     UPDATE 10/27/2023:  He is here for follow-up visit and denies any new complaints.  He has not had any further spells of speech difficulty or right arm weakness/numbness.  He denies numbness/tingling of the hands.   Medications:  Current Outpatient Medications on File Prior to Visit  Medication Sig Dispense Refill   aspirin  81 MG chewable tablet Chew 1 tablet (81 mg total) by mouth daily. 90 tablet 3   Loratadine (CLARITIN PO) Take 1 tablet by mouth as needed.     meloxicam  (MOBIC ) 7.5 MG tablet Take 1  tablet (7.5 mg total) by mouth daily. 30 tablet 0   metoprolol  succinate (TOPROL -XL) 25 MG 24 hr tablet Take 0.5 tablets (12.5 mg total) by mouth at bedtime. 45 tablet 2   NITROSTAT  0.4 MG SL tablet DISSOLVE ONE TABLET UNDER THE TONGUE EVERY 5 MINUTES AS NEEDED. 25 tablet 1   Omega-3 Fatty Acids (FISH OIL) 1000 MG CAPS Take 1 capsule by mouth in the morning.     simvastatin  (ZOCOR ) 20 MG tablet Take 1 tablet (20 mg total) by mouth daily at 6 PM. 90 tablet 0   No current facility-administered medications on file prior to visit.    Allergies:  Allergies  Allergen Reactions   Other Nausea Only    UNKNOWN PAIN MED    Vital Signs:  BP (!) 150/76   Pulse 63   Ht 5' 7 (1.702 m)   Wt 184 lb (83.5 kg)   SpO2 99%   BMI 28.82 kg/m    Neurological Exam: MENTAL STATUS including orientation to time, place, person, recent and remote memory, attention span and concentration, language, and fund of knowledge is normal.  Speech is not dysarthric.  CRANIAL NERVES:   Pupils equal round and reactive to light.  Normal conjugate, extra-ocular eye movements in all directions of gaze.  No ptosis.  Face is symmetric.  MOTOR:  Motor strength is 5/5 in all extremities.  No atrophy, fasciculations or abnormal movements.  No  pronator drift.  Tone is normal.    MSRs:  Reflexes are 2+/4 throughout, except 1+/4 at the ankles  SENSORY:  Intact to vibration throughout, except diminished at the ankles (lymphedema).  COORDINATION/GAIT:  Normal finger-to- nose-finger.  Intact rapid alternating movements bilaterally.  Gait shows stooped posture, narrow based and stable. Unassisted.   Data: NCS/EMG of the right hand 08/06/2023: Right median neuropathy at or distal to the wrist, consistent with a clinical diagnosis of carpal tunnel syndrome.  Overall, these findings are moderate in degree electrically.  MRI brain wo contrast 06/08/2023: 1.  No evidence of an acute intracranial abnormality. 2. Moderate cerebral  white matter chronic small vessel ischemic disease. 3. Generalized parenchymal atrophy. 4. Left sphenoid sinusitis.  MRA HEAD 09/23/2023: Normal intracranial MRA. No large vessel occlusion, hemodynamically significant stenosis, or other acute vascular abnormality. No aneurysm.   MRA NECK: 1. Mild for age atheromatous irregularity about the carotid bifurcations with associated mild 30% stenosis bilaterally. 2. Wide patency of both vertebral arteries within the neck. Left vertebral artery dominant.   Thank you for allowing me to participate in patient's care.  If I can answer any additional questions, I would be pleased to do so.    Sincerely,    Farzana Koci K. Tobie, DO

## 2023-11-04 ENCOUNTER — Emergency Department (HOSPITAL_COMMUNITY)

## 2023-11-04 ENCOUNTER — Encounter (HOSPITAL_COMMUNITY): Payer: Self-pay

## 2023-11-04 ENCOUNTER — Emergency Department (HOSPITAL_COMMUNITY)
Admission: EM | Admit: 2023-11-04 | Discharge: 2023-11-04 | Disposition: A | Discharge status: Home / Self Care | Source: Home / Self Care

## 2023-11-04 ENCOUNTER — Other Ambulatory Visit: Payer: Self-pay

## 2023-11-04 ENCOUNTER — Inpatient Hospital Stay (HOSPITAL_COMMUNITY)
Admission: EM | Admit: 2023-11-04 | Discharge: 2023-11-07 | DRG: 065 | Disposition: A | Discharge status: Home / Self Care | Attending: Internal Medicine | Admitting: Internal Medicine

## 2023-11-04 DIAGNOSIS — R1084 Generalized abdominal pain: Secondary | ICD-10-CM | POA: Insufficient documentation

## 2023-11-04 DIAGNOSIS — I4719 Other supraventricular tachycardia: Secondary | ICD-10-CM | POA: Diagnosis present

## 2023-11-04 DIAGNOSIS — Z800 Family history of malignant neoplasm of digestive organs: Secondary | ICD-10-CM

## 2023-11-04 DIAGNOSIS — Z8679 Personal history of other diseases of the circulatory system: Secondary | ICD-10-CM

## 2023-11-04 DIAGNOSIS — I2510 Atherosclerotic heart disease of native coronary artery without angina pectoris: Secondary | ICD-10-CM | POA: Diagnosis present

## 2023-11-04 DIAGNOSIS — I10 Essential (primary) hypertension: Secondary | ICD-10-CM | POA: Insufficient documentation

## 2023-11-04 DIAGNOSIS — I639 Cerebral infarction, unspecified: Secondary | ICD-10-CM | POA: Insufficient documentation

## 2023-11-04 DIAGNOSIS — R1310 Dysphagia, unspecified: Secondary | ICD-10-CM | POA: Diagnosis present

## 2023-11-04 DIAGNOSIS — R29704 NIHSS score 4: Secondary | ICD-10-CM | POA: Diagnosis present

## 2023-11-04 DIAGNOSIS — K7689 Other specified diseases of liver: Secondary | ICD-10-CM | POA: Diagnosis present

## 2023-11-04 DIAGNOSIS — Z8042 Family history of malignant neoplasm of prostate: Secondary | ICD-10-CM

## 2023-11-04 DIAGNOSIS — Z79899 Other long term (current) drug therapy: Secondary | ICD-10-CM | POA: Insufficient documentation

## 2023-11-04 DIAGNOSIS — I6523 Occlusion and stenosis of bilateral carotid arteries: Secondary | ICD-10-CM | POA: Diagnosis present

## 2023-11-04 DIAGNOSIS — Z7982 Long term (current) use of aspirin: Secondary | ICD-10-CM | POA: Insufficient documentation

## 2023-11-04 DIAGNOSIS — Z87891 Personal history of nicotine dependence: Secondary | ICD-10-CM

## 2023-11-04 DIAGNOSIS — Z85828 Personal history of other malignant neoplasm of skin: Secondary | ICD-10-CM

## 2023-11-04 DIAGNOSIS — R531 Weakness: Secondary | ICD-10-CM | POA: Diagnosis not present

## 2023-11-04 DIAGNOSIS — R748 Abnormal levels of other serum enzymes: Secondary | ICD-10-CM

## 2023-11-04 DIAGNOSIS — G459 Transient cerebral ischemic attack, unspecified: Secondary | ICD-10-CM

## 2023-11-04 DIAGNOSIS — G8191 Hemiplegia, unspecified affecting right dominant side: Secondary | ICD-10-CM | POA: Diagnosis present

## 2023-11-04 DIAGNOSIS — R509 Fever, unspecified: Secondary | ICD-10-CM | POA: Diagnosis not present

## 2023-11-04 DIAGNOSIS — Z7902 Long term (current) use of antithrombotics/antiplatelets: Secondary | ICD-10-CM

## 2023-11-04 DIAGNOSIS — Z951 Presence of aortocoronary bypass graft: Secondary | ICD-10-CM

## 2023-11-04 DIAGNOSIS — E785 Hyperlipidemia, unspecified: Secondary | ICD-10-CM | POA: Diagnosis present

## 2023-11-04 DIAGNOSIS — I6381 Other cerebral infarction due to occlusion or stenosis of small artery: Principal | ICD-10-CM | POA: Diagnosis present

## 2023-11-04 DIAGNOSIS — D72829 Elevated white blood cell count, unspecified: Secondary | ICD-10-CM | POA: Diagnosis present

## 2023-11-04 DIAGNOSIS — R7401 Elevation of levels of liver transaminase levels: Secondary | ICD-10-CM

## 2023-11-04 DIAGNOSIS — Z1152 Encounter for screening for COVID-19: Secondary | ICD-10-CM

## 2023-11-04 DIAGNOSIS — Z8673 Personal history of transient ischemic attack (TIA), and cerebral infarction without residual deficits: Secondary | ICD-10-CM

## 2023-11-04 DIAGNOSIS — R29810 Facial weakness: Secondary | ICD-10-CM | POA: Diagnosis present

## 2023-11-04 DIAGNOSIS — Z8249 Family history of ischemic heart disease and other diseases of the circulatory system: Secondary | ICD-10-CM

## 2023-11-04 DIAGNOSIS — K760 Fatty (change of) liver, not elsewhere classified: Secondary | ICD-10-CM | POA: Diagnosis present

## 2023-11-04 DIAGNOSIS — I493 Ventricular premature depolarization: Secondary | ICD-10-CM | POA: Diagnosis present

## 2023-11-04 LAB — CBC WITH DIFFERENTIAL/PLATELET
Abs Immature Granulocytes: 0.03 K/uL (ref 0.00–0.07)
Basophils Absolute: 0 K/uL (ref 0.0–0.1)
Basophils Relative: 0 %
Eosinophils Absolute: 0 K/uL (ref 0.0–0.5)
Eosinophils Relative: 0 %
HCT: 43.2 % (ref 39.0–52.0)
Hemoglobin: 14.4 g/dL (ref 13.0–17.0)
Immature Granulocytes: 0 %
Lymphocytes Relative: 5 %
Lymphs Abs: 0.5 K/uL — ABNORMAL LOW (ref 0.7–4.0)
MCH: 31.7 pg (ref 26.0–34.0)
MCHC: 33.3 g/dL (ref 30.0–36.0)
MCV: 95.2 fL (ref 80.0–100.0)
Monocytes Absolute: 0.6 K/uL (ref 0.1–1.0)
Monocytes Relative: 6 %
Neutro Abs: 8.2 K/uL — ABNORMAL HIGH (ref 1.7–7.7)
Neutrophils Relative %: 89 %
Platelets: 199 K/uL (ref 150–400)
RBC: 4.54 MIL/uL (ref 4.22–5.81)
RDW: 12.3 % (ref 11.5–15.5)
WBC: 9.3 K/uL (ref 4.0–10.5)
nRBC: 0 % (ref 0.0–0.2)

## 2023-11-04 LAB — CBC
HCT: 41.3 % (ref 39.0–52.0)
Hemoglobin: 13.9 g/dL (ref 13.0–17.0)
MCH: 31.2 pg (ref 26.0–34.0)
MCHC: 33.7 g/dL (ref 30.0–36.0)
MCV: 92.8 fL (ref 80.0–100.0)
Platelets: 192 K/uL (ref 150–400)
RBC: 4.45 MIL/uL (ref 4.22–5.81)
RDW: 12.3 % (ref 11.5–15.5)
WBC: 12.1 K/uL — ABNORMAL HIGH (ref 4.0–10.5)
nRBC: 0 % (ref 0.0–0.2)

## 2023-11-04 LAB — COMPREHENSIVE METABOLIC PANEL WITH GFR
ALT: 34 U/L (ref 0–44)
ALT: 243 U/L — ABNORMAL HIGH (ref 0–44)
AST: 63 U/L — ABNORMAL HIGH (ref 15–41)
AST: 289 U/L — ABNORMAL HIGH (ref 15–41)
Albumin: 3.5 g/dL (ref 3.5–5.0)
Albumin: 3.5 g/dL (ref 3.5–5.0)
Alkaline Phosphatase: 79 U/L (ref 38–126)
Alkaline Phosphatase: 119 U/L (ref 38–126)
Anion gap: 12 (ref 5–15)
Anion gap: 12 (ref 5–15)
BUN: 15 mg/dL (ref 8–23)
BUN: 15 mg/dL (ref 8–23)
CO2: 21 mmol/L — ABNORMAL LOW (ref 22–32)
CO2: 22 mmol/L (ref 22–32)
Calcium: 8.7 mg/dL — ABNORMAL LOW (ref 8.9–10.3)
Calcium: 8.7 mg/dL — ABNORMAL LOW (ref 8.9–10.3)
Chloride: 107 mmol/L (ref 98–111)
Chloride: 105 mmol/L (ref 98–111)
Creatinine, Ser: 0.98 mg/dL (ref 0.61–1.24)
Creatinine, Ser: 1 mg/dL (ref 0.61–1.24)
GFR, Estimated: >60 mL/min (ref >60)
GFR, Estimated: >60 mL/min (ref >60)
Glucose, Bld: 149 mg/dL — ABNORMAL HIGH (ref 70–99)
Glucose, Bld: 121 mg/dL — ABNORMAL HIGH (ref 70–99)
Potassium: 3.8 mmol/L (ref 3.5–5.1)
Potassium: 3.4 mmol/L — ABNORMAL LOW (ref 3.5–5.1)
Sodium: 140 mmol/L (ref 135–145)
Sodium: 139 mmol/L (ref 135–145)
Total Bilirubin: 1.2 mg/dL (ref 0.0–1.2)
Total Bilirubin: 2.8 mg/dL — ABNORMAL HIGH (ref 0.0–1.2)
Total Protein: 6.2 g/dL — ABNORMAL LOW (ref 6.5–8.1)
Total Protein: 6.4 g/dL — ABNORMAL LOW (ref 6.5–8.1)

## 2023-11-04 LAB — DIFFERENTIAL
Abs Immature Granulocytes: 0.07 K/uL (ref 0.00–0.07)
Basophils Absolute: 0 K/uL (ref 0.0–0.1)
Basophils Relative: 0 %
Eosinophils Absolute: 0 K/uL (ref 0.0–0.5)
Eosinophils Relative: 0 %
Immature Granulocytes: 1 %
Lymphocytes Relative: 2 %
Lymphs Abs: 0.3 K/uL — ABNORMAL LOW (ref 0.7–4.0)
Monocytes Absolute: 0.9 K/uL (ref 0.1–1.0)
Monocytes Relative: 7 %
Neutro Abs: 10.9 K/uL — ABNORMAL HIGH (ref 1.7–7.7)
Neutrophils Relative %: 90 %

## 2023-11-04 LAB — PROTIME-INR
INR: 1 (ref 0.8–1.2)
Prothrombin Time: 14 s (ref 11.4–15.2)

## 2023-11-04 LAB — URINALYSIS, ROUTINE W REFLEX MICROSCOPIC
Bilirubin Urine: NEGATIVE
Glucose, UA: NEGATIVE mg/dL
Hgb urine dipstick: NEGATIVE
Ketones, ur: NEGATIVE mg/dL
Leukocytes,Ua: NEGATIVE
Nitrite: NEGATIVE
Protein, ur: 100 mg/dL — AB
Specific Gravity, Urine: 1.019 (ref 1.005–1.030)
pH: 5 (ref 5.0–8.0)

## 2023-11-04 LAB — RAPID URINE DRUG SCREEN, HOSP PERFORMED
Amphetamines: NOT DETECTED
Barbiturates: NOT DETECTED
Benzodiazepines: NOT DETECTED
Cocaine: NOT DETECTED
Opiates: NOT DETECTED
Tetrahydrocannabinol: NOT DETECTED

## 2023-11-04 LAB — I-STAT CHEM 8, ED
BUN: 16 mg/dL (ref 8–23)
Calcium, Ion: 1.04 mmol/L — ABNORMAL LOW (ref 1.15–1.40)
Chloride: 104 mmol/L (ref 98–111)
Creatinine, Ser: 0.9 mg/dL (ref 0.61–1.24)
Glucose, Bld: 123 mg/dL — ABNORMAL HIGH (ref 70–99)
HCT: 41 % (ref 39.0–52.0)
Hemoglobin: 13.9 g/dL (ref 13.0–17.0)
Potassium: 3.4 mmol/L — ABNORMAL LOW (ref 3.5–5.1)
Sodium: 140 mmol/L (ref 135–145)
TCO2: 21 mmol/L — ABNORMAL LOW (ref 22–32)

## 2023-11-04 LAB — CBG MONITORING, ED: Glucose-Capillary: 127 mg/dL — ABNORMAL HIGH (ref 70–99)

## 2023-11-04 LAB — APTT: aPTT: 27 s (ref 24–36)

## 2023-11-04 LAB — ETHANOL: Alcohol, Ethyl (B): <15 mg/dL (ref <15)

## 2023-11-04 LAB — LIPASE, BLOOD: Lipase: 52 U/L — ABNORMAL HIGH (ref 11–51)

## 2023-11-04 MED ORDER — IOHEXOL 350 MG/ML SOLN
75.0000 mL | Freq: Once | INTRAVENOUS | Status: AC | PRN
  Administered 2023-11-04: 75 mL via INTRAVENOUS

## 2023-11-04 MED ORDER — CLOPIDOGREL BISULFATE 300 MG PO TABS
300.0000 mg | ORAL_TABLET | Freq: Once | ORAL | Status: AC
  Administered 2023-11-04: 300 mg via ORAL
  Filled 2023-11-04: qty 1

## 2023-11-04 MED ORDER — ASPIRIN 325 MG PO TABS
325.0000 mg | ORAL_TABLET | Freq: Once | ORAL | Status: AC
  Administered 2023-11-04: 325 mg via ORAL
  Filled 2023-11-04: qty 1

## 2023-11-04 NOTE — Code Documentation (Signed)
 Stroke Response Nurse Documentation Code Documentation  Robert Pacheco is a 89 y.o. male arriving to [place]  via [facility] on 10/8 with past medical hx of HTN, HLD, CAD. On aspirin  81 mg daily. Code stroke was activated by EMS.   Patient from home where he was LKW at 1845 and now complaining of right sided facial droop and right sided weakness. Mr. Schetter was her in ED earlier today for similar symptoms which resolved.   Stroke team at the bedside on patient arrival. Labs drawn and patient cleared for CT by Dr. Franklyn. Patient to CT with team. NIHSS 4, see documentation for details and code stroke times. Patient with right facial droop, right arm weakness, right leg weakness, and dysarthria  on exam. The following imaging was completed:  CT Head and CTA. Patient is not a candidate for IV Thrombolytic due to too mild to treat. Patient is not a candidate for IR due to No LVO.   Care Plan: Neuro checks q 15 mins until 2315.     Bedside handoff with ED RN Lorenza.    Griselda Alm ORN  Rapid Response RN

## 2023-11-04 NOTE — Consult Note (Addendum)
 NEUROLOGY CONSULT NOTE   Date of service: November 04, 2023 Patient Name: Robert Pacheco MRN:  [id number] DOB:  [birth year] Chief Complaint: R facial droop and R sided weakness Requesting Provider: No att. providers found  History of Present Illness  Robert Pacheco is a 89 y.o. male with hx of CAD, HTN, HLD, who is brought in with sudden onset R facial droop and R sided weakness. He was seen earlier in the ED with abdominal pain and R sided numbness and R facial droop but focal neuro symptoms had resolved and he was discharged after abdominal pain resolved after a bowel movement and referred to outpatient neurology.  It seems like back in may of 2025, he had TIA with R arm numbness and transient dysarthria. MRI brain and MRA h + n with no acute strokes, no LVO os significant stenosis.  He is improved with mild R facial droop and slurred speech and slight R arm drift.  LKW: 1845 Modified rankin score: 1-No significant post stroke disability and can perform usual duties with stroke symptoms IV Thrombolysis: not offered at this time due to nondisabling deficits. EVT: not offered, too mild to treat.  NIHSS components Score: Comment  1a Level of Conscious 0[x]  1[]  2[]  3[]      1b LOC Questions 0[x]  1[]  2[]       1c LOC Commands 0[x]  1[]  2[]       2 Best Gaze 0[x]  1[]  2[]       3 Visual 0[x]  1[]  2[]  3[]      4 Facial Palsy 0[]  1[x]  2[]  3[]      5a Motor Arm - left 0[x]  1[]  2[]  3[]  4[]  UN[]    5b Motor Arm - Right 0[]  1[x]  2[]  3[]  4[]  UN[]    6a Motor Leg - Left 0[x]  1[]  2[]  3[]  4[]  UN[]    6b Motor Leg - Right 0[]  1[x]  2[]  3[]  4[]  UN[]    7 Limb Ataxia 0[x]  1[]  2[]  UN[]      8 Sensory 0[x]  1[]  2[]  UN[]      9 Best Language 0[x]  1[]  2[]  3[]      10 Dysarthria 0[]  1[x]  2[]  UN[]      11 Extinct. and Inattention 0[x]  1[]  2[]       TOTAL:       ROS  Comprehensive ROS performed and pertinent positives documented in HPI   Past History   Past Medical History:  Diagnosis Date   Allergy    Coronary  artery disease    Hyperlipidemia    Hypertension    Personal history of colonic adenomas 05/10/2012   Skin cancer, basal cell     Past Surgical History:  Procedure Laterality Date   CATARACT EXTRACTION     COLONOSCOPY  multiple   CORONARY ARTERY BYPASS GRAFT  2000   EYE SURGERY     HERNIA REPAIR     MOHS SURGERY      Family History: Family History  Problem Relation Age of Onset   Heart disease Father    Colon cancer Brother 46   Healthy Child        x 4   Healthy Grandchild        x 11   Colon cancer Sister 21   Prostate cancer Brother     Social History  reports that he has quit smoking. He has never used smokeless tobacco. He reports that he does not drink alcohol and does not use drugs.  Allergies  Allergen Reactions   Other Nausea Only    UNKNOWN PAIN MED  Medications  No current facility-administered medications for this encounter.  Current Outpatient Medications:    aspirin  81 MG chewable tablet, Chew 1 tablet (81 mg total) by mouth daily., Disp: 90 tablet, Rfl: 3   Loratadine (CLARITIN PO), Take 1 tablet by mouth as needed., Disp: , Rfl:    meloxicam  (MOBIC ) 7.5 MG tablet, Take 1 tablet (7.5 mg total) by mouth daily., Disp: 30 tablet, Rfl: 0   metoprolol  succinate (TOPROL -XL) 25 MG 24 hr tablet, Take 0.5 tablets (12.5 mg total) by mouth at bedtime., Disp: 45 tablet, Rfl: 2   NITROSTAT  0.4 MG SL tablet, DISSOLVE ONE TABLET UNDER THE TONGUE EVERY 5 MINUTES AS NEEDED., Disp: 25 tablet, Rfl: 1   Omega-3 Fatty Acids (FISH OIL) 1000 MG CAPS, Take 1 capsule by mouth in the morning., Disp: , Rfl:    simvastatin  (ZOCOR ) 20 MG tablet, Take 1 tablet (20 mg total) by mouth daily at 6 PM., Disp: 90 tablet, Rfl: 0  Vitals   There were no vitals filed for this visit.  There is no height or weight on file to calculate BMI.   Physical Exam   General: Laying comfortably in bed; in no acute distress.  HENT: Normal oropharynx and mucosa. Normal external appearance of  ears and nose.  Neck: Supple, no pain or tenderness  CV: No JVD. No peripheral edema.  Pulmonary: Symmetric Chest rise. Normal respiratory effort.  Abdomen: Soft to touch, non-tender.  Ext: No cyanosis, edema, or deformity  Skin: No rash. Normal palpation of skin.   Musculoskeletal: Normal digits and nails by inspection. No clubbing.   Neurologic Examination  Mental status/Cognition: Alert, oriented to self, place, month and year, good attention.  Speech/language: slurred speech, Fluent, comprehension intact, object naming intact, repetition intact.  Cranial nerves:   CN II Pupils equal and reactive to light, no VF deficits    CN III,IV,VI EOM intact, no gaze preference or deviation, no nystagmus    CN V normal sensation in V1, V2, and V3 segments bilaterally    CN VII R facial droop   CN VIII normal hearing to speech    CN IX & X normal palatal elevation, no uvular deviation    CN XI 5/5 head turn and 5/5 shoulder shrug bilaterally    CN XII midline tongue protrusion    Motor:  Muscle bulk: normal, tone normal, pronator drift noted in RUE. Mvmt Root Nerve  Muscle Right Left Comments  SA C5/6 Ax Deltoid 4 5   EF C5/6 Mc Biceps 4+ 5   EE C6/7/8 Rad Triceps 4+ 5   WF C6/7 Med FCR     WE C7/8 PIN ECU     F Ab C8/T1 U ADM/FDI 5 5   HF L1/2/3 Fem Illopsoas 4 5   KE L2/3/4 Fem Quad 5 5   DF L4/5 D Peron Tib Ant 5 5   PF S1/2 Tibial Grc/Sol 5 5    Sensation:  Light touch Intact throughout   Pin prick    Temperature    Vibration   Proprioception    Coordination/Complex Motor:  - Finger to Nose intact BL - Heel to shin intact BL - Rapid alternating movement are slowed on the right. - Gait: deferred.   Labs/Imaging/Neurodiagnostic studies   CBC:  Recent Labs  Lab Nov 21, 2023 0942  WBC 9.3  NEUTROABS 8.2*  HGB 14.4  HCT 43.2  MCV 95.2  PLT 199   Basic Metabolic Panel:  Lab Results  Component Value Date  NA 140 11/04/2023   K 3.8 11/04/2023   CO2 21 (L)  11/04/2023   GLUCOSE 149 (H) 11/04/2023   BUN 15 11/04/2023   CREATININE 0.98 11/04/2023   CALCIUM 8.7 (L) 11/04/2023   GFRNONAA >60 11/04/2023   GFRAA 94 03/02/2008   Lipid Panel:  Lab Results  Component Value Date   LDLCALC 47 10/12/2023   HgbA1c: No results found for: HGBA1C Urine Drug Screen: No results found for: LABOPIA, COCAINSCRNUR, LABBENZ, AMPHETMU, THCU, LABBARB  Alcohol Level No results found for: [facility] INR  Lab Results  Component Value Date   INR 1.1 (H) 09/18/2020   APTT No results found for: APTT AED levels: No results found for: PHENYTOIN, ZONISAMIDE, LAMOTRIGINE, LEVETIRACETA  CT Head without contrast(Personally reviewed): CTH was negative for a large hypodensity concerning for a large territory infarct or hyperdensity concerning for an ICH  CT angio Head and Neck with contrast(Personally reviewed): No LVO noted.  MRI Brain(Personally reviewed): Pending  ASSESSMENT   Robert Pacheco is a 89 y.o. male ith hx of CAD, HTN, HLD, who is brought in with sudden onset R facial droop and R sided weakness. He was seen earlier in the ED with abdominal pain and R sided numbness and R facial droop but focal neuro symptoms had resolved and he was discharged after abdominal pain resolved after a bowel movement and referred to outpatient neurology.  It seems like back in may of 2025, he had TIA with R arm numbness and transient dysarthria. MRI brain and MRA h + n with no acute strokes, no LVO os significant stenosis.  He is improved with mild R facial droop and slurred speech and slight R arm drift.  Symptoms concerning for a stuttering lacunar infarct vs TIA.  RECOMMENDATIONS  Plan: - monitor in the ED Q22mins until outside tnkase window at 1115PM. Active code stroke if symptoms worsen. - Frequent Neuro checks per stroke unit protocol - Recommend brain imaging with MRI Brain without contrast - Recommend obtaining TTE  - Recommend obtaining  Lipid panel with LDL - Please start statin if LDL > 70 - Recommend HbA1c to evaluate for diabetes and how well it is controlled. - Antithrombotic - aspirin  325mg  once, plavix 300mg  once, followed by Aspirin  81mg  daily along with plavix 75mg  daily x 21days, followed by Aspirin  81mg  daily alone. - Recommend DVT ppx - SBP goal - permissive hypertension first 24 h < 220/110. Held home meds.  - Recommend Telemetry monitoring for arrythmia - Recommend bedside swallow screen prior to PO intake. - Stroke education booklet - Recommend PT/OT/SLP consult  ______________________________________________________________________  Plan discussed with Robert Pacheco with the ED team and with Robert Pacheco with the ED team. Discussed with ED RN to call a code stroke if noted worsening of symptoms.  This patient is critically ill and at significant risk of neurological worsening, death and care requires constant monitoring of vital signs, hemodynamics,respiratory and cardiac monitoring, neurological assessment, discussion with family, other specialists and medical decision making of high complexity. I spent 50 minutes of neurocritical care time  in the care of  this patient. This was time spent independent of any time provided by nurse practitioner or PA.  Robert Pacheco [facility] 11/04/2023  8:35 PM   Signed, Synthia Fairbank, MD [facility]

## 2023-11-04 NOTE — ED Notes (Signed)
 Called and placed PT on monitor with CCMD

## 2023-11-04 NOTE — ED Notes (Signed)
 PT has passed swallow screen

## 2023-11-04 NOTE — ED Provider Notes (Signed)
 [place] EMERGENCY DEPARTMENT AT [facility] Provider Note   CSN: [id number] Arrival date & time: 11/04/23  1947     History  Chief Complaint  Patient presents with   Code Stroke    Robert Pacheco is a 89 y.o. male with PMH as listed below who presents as code stroke.  Patient was seen earlier in the emergency department today for abdominal pain and right-sided numbness and right facial droop but his neurologic symptoms improved and resolved and he was discharged after his abdominal pain resolved after a bowel movement and he was referred to outpatient neurology.  He was discharged with a plan for TIA follow-up.  After getting discharged to home he was noted to have recurrent right-sided weakness at 7:45 PM.  Patient did not have an abdomen pelvis scan earlier in the day because his symptoms had improved.  He is still not having any abdominal pain or nausea vomiting.  No urinary symptoms or fever/chills.  Patient is stroke coded by EMS.  At the time he arrived to the ED, his symptoms seem to be improving. Has drift in R arm and R leg. Slight facial droop and some slurring of speech.   Past Medical History:  Diagnosis Date   Allergy    Coronary artery disease    Hyperlipidemia    Hypertension    Personal history of colonic adenomas 05/10/2012   Skin cancer, basal cell        Home Medications Prior to Admission medications   Medication Sig Start Date End Date Taking? Authorizing Provider  aspirin  81 MG chewable tablet Chew 1 tablet (81 mg total) by mouth daily. 09/10/20   Tobb, Kardie, DO  Loratadine (CLARITIN PO) Take 1 tablet by mouth as needed.    [provider]  meloxicam  (MOBIC ) 7.5 MG tablet Take 1 tablet (7.5 mg total) by mouth daily. 09/21/23   [name], DO  metoprolol  succinate (TOPROL -XL) 25 MG 24 hr tablet Take 0.5 tablets (12.5 mg total) by mouth at bedtime. 06/12/23   [name], DO  NITROSTAT  0.4 MG SL tablet DISSOLVE ONE  TABLET UNDER THE TONGUE EVERY 5 MINUTES AS NEEDED. 09/21/23   [name], DO  Omega-3 Fatty Acids (FISH OIL) 1000 MG CAPS Take 1 capsule by mouth in the morning.    [provider]  simvastatin  (ZOCOR ) 20 MG tablet Take 1 tablet (20 mg total) by mouth daily at 6 PM. 08/11/23   Wendling, [name], DO      Allergies    Other    Review of Systems   Review of Systems A 10 point review of systems was performed and is negative unless otherwise reported in HPI.  Physical Exam Updated Vital Signs BP (!) 164/73 (BP Location: Left Arm)   Pulse 85   Temp 98.2 F (36.8 C) (Oral)   Resp 18   Ht 5' 8.4 (1.737 m)   Wt 82.8 kg   SpO2 96%   BMI 27.43 kg/m  Physical Exam General: Normal appearing elderly male, lying in bed.  HEENT: PERRLA, EOMI, Sclera anicteric, MMM, trachea midline. Tongue protrudes midline.  Cardiology: RRR, no murmurs/rubs/gallops.  Resp: Normal respiratory rate and effort. CTAB, no wheezes, rhonchi, crackles.  Abd: Soft, non-tender, non-distended. No rebound tenderness or guarding.  GU: Deferred. MSK: No peripheral edema or signs of trauma. Extremities without deformity or TTP. No cyanosis or clubbing. Skin: warm, dry.  Neuro: A&Ox4,mild R facial droop. 4/5 strength in RUE/RLE. 5/5  strength in LUE/LLE. Sensation grossly intact. Mild dysarthria. Psych: Normal mood and affect.   1a  Level of consciousness: 0=alert; keenly responsive  1b. LOC questions:  0=Performs both tasks correctly  1c. LOC commands: 0=Performs both tasks correctly  2.  Best Gaze: 0=normal  3.  Visual: 0=No visual loss  4. Facial Palsy: 1=Minor paralysis (flattened nasolabial fold, asymmetric on smiling)  5a.  Motor left arm: 0=No drift, limb holds 90 (or 45) degrees for full 10 seconds  5b.  Motor right arm: 1=Drift, limb holds 90 (or 45) degrees but drifts down before full 10 seconds: does not hit bed  6a. motor left leg: 0=No drift, limb holds 90 (or 45) degrees for full  10 seconds  6b  Motor right leg:  1=Drift, limb holds 90 (or 45) degrees but drifts down before full 10 seconds: does not hit bed  7. Limb Ataxia: 0=Absent  8.  Sensory: 0=Normal; no sensory loss  9. Best Language:  0=No aphasia, normal  10. Dysarthria: 1=Mild to moderate, patient slurs at least some words and at worst, can be understood with some difficulty  11. Extinction and Inattention: 0=No abnormality   Total:   4        ED Results / Procedures / Treatments   Labs (all labs ordered are listed, but only abnormal results are displayed) Labs Reviewed  CBC - Abnormal; Notable for the following components:      Result Value   WBC 12.1 (*)    All other components within normal limits  DIFFERENTIAL - Abnormal; Notable for the following components:   Neutro Abs 10.9 (*)    Lymphs Abs 0.3 (*)    All other components within normal limits  ETHANOL  PROTIME-INR  APTT  COMPREHENSIVE METABOLIC PANEL WITH GFR  RAPID URINE DRUG SCREEN, HOSP PERFORMED  I-STAT CHEM 8, ED    EKG EKG Interpretation Date/Time:  Wednesday November 04 2023 20:17:20 EDT Ventricular Rate:  89 PR Interval:  164 QRS Duration:  98 QT Interval:  375 QTC Calculation: 457 R Axis:   62  Text Interpretation: Sinus rhythm Probable left atrial enlargement Confirmed by Franklyn Gills [phone]) on 11/04/2023 9:41:17 PM  Radiology CT HEAD CODE STROKE WO CONTRAST Result Date: 11/04/2023 EXAM: CT HEAD WITHOUT CONTRAST 11/04/2023 08:02:33 PM TECHNIQUE: CT of the head was performed without the administration of intravenous contrast. Automated exposure control, iterative reconstruction, and/or weight based adjustment of the mA/kV was utilized to reduce the radiation dose to as low as reasonably achievable. COMPARISON: None available. CLINICAL HISTORY: Neuro deficit, acute, stroke suspected. No contrast; CT HEAD CODE STROKE WO CONTRAST FINDINGS: BRAIN AND VENTRICLES: No acute hemorrhage. No evidence of acute infarct. No  hydrocephalus. No extra-axial collection. No mass effect or midline shift. Patchy white matter hypodensities are compatible with chronic microvascular ischemic change. ORBITS: No acute abnormality. SINUSES: No acute abnormality. SOFT TISSUES AND SKULL: No acute soft tissue abnormality. No skull fracture. IMPRESSION: 1. No acute intracranial abnormality. Findings conveyed to Dr. Vanessa via telephone at 8:09 PM. Electronically signed by: Gilmore Molt MD 11/04/2023 08:10 PM EDT RP Workstation: [id number]    Procedures .Critical Care  Performed by: Franklyn Gills SAILOR, MD Authorized by: Franklyn Gills SAILOR, MD   Critical care provider statement:    Critical care time (minutes):  30   Critical care was necessary to treat or prevent imminent or life-threatening deterioration of the following conditions:  CNS failure or compromise   Critical care was time spent personally by  me on the following activities:  Development of treatment plan with patient or surrogate, discussions with consultants, evaluation of patient's response to treatment, examination of patient, ordering and review of laboratory studies, ordering and review of radiographic studies, ordering and performing treatments and interventions, pulse oximetry, re-evaluation of patient's condition, review of old charts and obtaining history from patient or surrogate   Care discussed with: admitting provider       Medications Ordered in ED Medications  aspirin  tablet 325 mg (has no administration in time range)  clopidogrel (PLAVIX) tablet 300 mg (has no administration in time range)  iohexol  (OMNIPAQUE ) 350 MG/ML injection 75 mL (75 mLs Intravenous Contrast Given 11/04/23 2004)    ED Course/ Medical Decision Making/ A&P                          Medical Decision Making Amount and/or Complexity of Data Reviewed Labs: ordered. Decision-making details documented in ED Course. Radiology: ordered. Decision-making details documented in ED  Course.  Risk Decision regarding hospitalization.    This patient presents to the ED for concern of right sided symptoms, this involves an extensive number of treatment options, and is a complaint that carries with it a high risk of complications and morbidity.  I considered the following differential and admission for this acute, potentially life threatening condition.   MDM:    Given the acute onset of neurological symptoms, stroke is the most concerning etiology of these acute symptoms. The neuro exam is significant for mild R sided deficits that seem to be improving and mild dysarthria. Neurology team evaluating patient at bedside. Plan to obtain emergent CT brain.  LKN: 7:45 pm Glucose: 126 mg/dL AC: No BP: 117/60  CT Noncon unremarkable.  CTA head and neck shows 50% stenosis of left ICA proximally and severe right vertebral artery origin stenosis and moderate stenosis of the distal right vertebral artery near its dural margin.  No LVO.  He has no significant electrolyte derangements or hypo-/hyperglycemia.  He does have a mild leukocytosis and a new transaminitis from this morning and obtained a CT abdomen pelvis and a right upper quadrant ultrasound that were reassuring and showed only mild hepatic steatosis.  He does not have any significant right upper quadrant tenderness outpatient and no nausea vomiting, reports improved abdominal pain from earlier this morning.  Will continue to monitor his transaminitis.  From a neurology perspective his symptoms seem to be improving and could be another TIA.  Neurology recommends aspirin  Plavix and will continue to monitor his symptoms as well as get an MRI of his brain.  Plan is to admit to medicine with neurology following, obtain TTE, lipid panel, hemoglobin A1c, and utilize permissive hypertension.   Clinical Course as of 11/12/23 2111  Wed Nov 04, 2023  2015 Neuro giving ASA/plavix load. TNK window ends at 11:15 pm. Needs frequent monitoring  and if sxs recur, call new code stroke.  [HN]  2140 ALT(!): 243 [HN]  2140 AST(!): 289 New transaminitis [HN]  2141 WBC(!): 12.1 +leukocytosis [HN]  2142 Will get CT abd pelvis wo contrast, as patient already had received contrast load w/ CT angio, and will order RUQ US  as well for tomorrow morning. [HN]  2355 CT ABDOMEN PELVIS WO CONTRAST Chronic changes as described above.  No acute abnormality is noted. [HN]  2355 US  Abdomen Limited RUQ (LIVER/GB) Hepatic steatosis. Please note limited evaluation for focal hepatic masses in a patient with hepatic steatosis due  to decreased penetration of the acoustic ultrasound waves.   [HN]    Clinical Course User Index [HN] Franklyn Sid SAILOR, MD    Labs: I Ordered, and personally interpreted labs.  The pertinent results include:  those listed above  Imaging Studies ordered: I ordered imaging studies including CTH, CTA H&N, MRI brain wo contrast I independently visualized and interpreted imaging. I agree with the radiologist interpretation  Additional history obtained from chart review, family at bedside.   Cardiac Monitoring: The patient was maintained on a cardiac monitor.  I personally viewed and interpreted the cardiac monitored which showed an underlying rhythm of: NSR  Reevaluation: After the interventions noted above, I reevaluated the patient and found that they have :improved  Social Determinants of Health: Lives independently  Disposition:  Admit to medicine w/ neurology following  Co morbidities that complicate the patient evaluation  Past Medical History:  Diagnosis Date   Allergy    Coronary artery disease    Hyperlipidemia    Hypertension    Personal history of colonic adenomas 05/10/2012   Skin cancer, basal cell      Medicines Meds ordered this encounter  Medications   aspirin  tablet 325 mg   clopidogrel (PLAVIX) tablet 300 mg   iohexol  (OMNIPAQUE ) 350 MG/ML injection 75 mL    I have reviewed the patients  home medicines and have made adjustments as needed  Problem List / ED Course: Problem List Items Addressed This Visit       Cardiovascular and Mediastinum   Acute CVA (cerebrovascular accident) (HCC)   Relevant Medications   aspirin  81 MG chewable tablet   Other Relevant Orders   Ambulatory referral to Neurology     Nervous and Auditory   * (Principal) Right sided weakness - Primary     Other   Facial droop   Transaminitis                This note was created using dictation software, which may contain spelling or grammatical errors.    Franklyn Sid SAILOR, MD 11/12/23 2120

## 2023-11-04 NOTE — ED Triage Notes (Signed)
 PT BIB [facility] with a report of code stroke. At 1845, PT's family reportedly noticed dysphagia, right sided facial droop and right arm weakness. PT CBG was 126.

## 2023-11-04 NOTE — ED Triage Notes (Signed)
 BIB EMS from home r/t generalized ABD that started this morning. Denies N/V/D and fevers. Report last BM yesterday. A&Ox4.

## 2023-11-04 NOTE — ED Provider Notes (Signed)
 [place] EMERGENCY DEPARTMENT AT [facility] Provider Note   CSN: [id number] Arrival date & time: 11/04/23  9142     Patient presents with: Abdominal Pain   Robert Pacheco is a 89 y.o. male.   89 year old male brought in by EMS from home with abdominal pain. Patient was assisted to the bathroom and had a bowel movement on arrival in the ER and had complete resolution of his pain. Patient states he woke up at 6am today feeling well, got out of bed and noted some abdominal pain. Patient drank a cup of coffee with ginger cookies to try and stimulate a bowel movement. Around 7:20am his pain increased and he called family at 7:28am. Family noted patient's speech was slurred, they went to his house where they found him to be diaphoretic, with right side facial droop with complaint of right arm and leg numbness. EMS was called, patient arrived in the ER with resolution of the numbness and pain resolved after bowel movement. Patient and family note similar to May 2025 TIA work up in the ER, negative MRI, followed up outpatient with neurology and was just recently cleared.        Prior to Admission medications   Medication Sig Start Date End Date Taking? Authorizing Provider  aspirin  81 MG chewable tablet Chew 1 tablet (81 mg total) by mouth daily. 09/10/20   Tobb, Kardie, DO  Loratadine (CLARITIN PO) Take 1 tablet by mouth as needed.    [provider]  meloxicam  (MOBIC ) 7.5 MG tablet Take 1 tablet (7.5 mg total) by mouth daily. 09/21/23   [name], DO  metoprolol  succinate (TOPROL -XL) 25 MG 24 hr tablet Take 0.5 tablets (12.5 mg total) by mouth at bedtime. 06/12/23   [name], DO  NITROSTAT  0.4 MG SL tablet DISSOLVE ONE TABLET UNDER THE TONGUE EVERY 5 MINUTES AS NEEDED. 09/21/23   [name], DO  Omega-3 Fatty Acids (FISH OIL) 1000 MG CAPS Take 1 capsule by mouth in the morning.    [provider]  simvastatin  (ZOCOR ) 20 MG  tablet Take 1 tablet (20 mg total) by mouth daily at 6 PM. 08/11/23   Wendling, [name], DO    Allergies: Other    Review of Systems Negative except as per HPI Updated Vital Signs BP (!) 154/92 (BP Location: Right Arm)   Pulse (!) 52   Temp 98.5 F (36.9 C) (Oral)   Resp 18   Ht 5' 8.4 (1.737 m)   Wt 83.5 kg   SpO2 98%   BMI 27.66 kg/m   Physical Exam Vitals and nursing note reviewed.  Constitutional:      General: He is not in acute distress.    Appearance: He is well-developed. He is not diaphoretic.  HENT:     Head: Normocephalic and atraumatic.     Mouth/Throat:     Mouth: Mucous membranes are moist.  Eyes:     Extraocular Movements: Extraocular movements intact.     Pupils: Pupils are equal, round, and reactive to light.  Cardiovascular:     Rate and Rhythm: Normal rate and regular rhythm.     Heart sounds: Normal heart sounds.  Pulmonary:     Effort: Pulmonary effort is normal.     Breath sounds: Normal breath sounds.  Abdominal:     Palpations: Abdomen is soft.     Tenderness: There is no abdominal tenderness.  Musculoskeletal:     Cervical back: Neck supple.  Right lower leg: No edema.     Left lower leg: No edema.  Skin:    General: Skin is warm and dry.     Findings: No erythema or rash.  Neurological:     Mental Status: He is alert and oriented to person, place, and time.     GCS: GCS eye subscore is 4. GCS verbal subscore is 5. GCS motor subscore is 6.     Cranial Nerves: No cranial nerve deficit, dysarthria or facial asymmetry.     Sensory: No sensory deficit.     Motor: No weakness or pronator drift.     Coordination: Coordination normal. Finger-Nose-Finger Test and Heel to Shin Test normal.     Gait: Gait normal.  Psychiatric:        Behavior: Behavior normal.     (all labs ordered are listed, but only abnormal results are displayed) Labs Reviewed  CBC WITH DIFFERENTIAL/PLATELET - Abnormal; Notable for the following components:       Result Value   Neutro Abs 8.2 (*)    Lymphs Abs 0.5 (*)    All other components within normal limits  COMPREHENSIVE METABOLIC PANEL WITH GFR - Abnormal; Notable for the following components:   CO2 21 (*)    Glucose, Bld 149 (*)    Calcium 8.7 (*)    Total Protein 6.2 (*)    AST 63 (*)    All other components within normal limits  LIPASE, BLOOD - Abnormal; Notable for the following components:   Lipase 52 (*)    All other components within normal limits  URINALYSIS, ROUTINE W REFLEX MICROSCOPIC - Abnormal; Notable for the following components:   Color, Urine AMBER (*)    APPearance HAZY (*)    Protein, ur 100 (*)    Bacteria, UA RARE (*)    All other components within normal limits    EKG: None  Radiology: No results found.   Procedures   Medications Ordered in the ED - No data to display                                  Medical Decision Making Amount and/or Complexity of Data Reviewed Labs: ordered.   This patient presents to the ED for concern of abdominal pain, right arm and leg numbness with speech change, this involves an extensive number of treatment options, and is a complaint that carries with it a high risk of complications and morbidity.  The differential diagnosis includes but not limited to CVA, TIA, ACS, dissection, constipation, colitis   Co morbidities / Chronic conditions that complicate the patient evaluation  Hyperlipidemia, hypertension, CAD   Additional history obtained:  Additional history obtained from EMR External records from outside source obtained and reviewed including prior labs and imaging on file.  Prior workup dated May 2025 with similar presentation of abdominal pain and right side deficits with negative MRI.   Lab Tests:  I Ordered, and personally interpreted labs.  The pertinent results include: Urinalysis with protein, rare bacteria, negative for nitrates and leukocytes.  CMP without significant findings.  CBC without  significant findings.   Problem List / ED Course / Critical interventions / Medication management  89 year old male brought in by EMS from home with concern for abdominal pain with right sided arm and leg numbness.  On arrival to the emergency room, numbness has resolved.  Patient ambulatory to the bathroom, had a bowel  movement and abdominal pain has also resolved.  Labs were obtained and do not have a significant findings.  Observed in the emergency department, remains asymptomatic.  Patient requesting discharge.  He plans to follow-up with his primary care provider and neurology.  Discussed return to ER precautions, discussed possible TIA causing risk for increased risk of CVA, patient family verbalized understanding and declined further workup today. I have reviewed the patients home medicines and have made adjustments as needed   Consultations Obtained:  I requested consultation with the ER attending, Dr. Gennaro,  and discussed lab and imaging findings as well as pertinent plan - they recommend: Agrees with plan of care   Social Determinants of Health:  Lives at home, family nearby   Test / Admission - Considered:  Stable for discharge.  Consider CT head, CT abdomen pelvis.  Patient declines, symptoms have resolved      Final diagnoses:  Generalized abdominal pain  TIA (transient ischemic attack)    ED Discharge Orders     None          Beverley Leita LABOR, PA-C 11/04/23 1411    Kammerer, Megan L, DO 11/05/23 1003

## 2023-11-05 ENCOUNTER — Inpatient Hospital Stay (HOSPITAL_COMMUNITY)

## 2023-11-05 ENCOUNTER — Other Ambulatory Visit (HOSPITAL_COMMUNITY)

## 2023-11-05 ENCOUNTER — Encounter (HOSPITAL_COMMUNITY): Payer: Self-pay | Admitting: Internal Medicine

## 2023-11-05 DIAGNOSIS — R29810 Facial weakness: Secondary | ICD-10-CM | POA: Insufficient documentation

## 2023-11-05 DIAGNOSIS — Z7982 Long term (current) use of aspirin: Secondary | ICD-10-CM

## 2023-11-05 DIAGNOSIS — R531 Weakness: Secondary | ICD-10-CM | POA: Diagnosis present

## 2023-11-05 DIAGNOSIS — R29704 NIHSS score 4: Secondary | ICD-10-CM | POA: Diagnosis present

## 2023-11-05 DIAGNOSIS — R29703 NIHSS score 3: Secondary | ICD-10-CM | POA: Diagnosis not present

## 2023-11-05 DIAGNOSIS — Z951 Presence of aortocoronary bypass graft: Secondary | ICD-10-CM | POA: Diagnosis not present

## 2023-11-05 DIAGNOSIS — I6381 Other cerebral infarction due to occlusion or stenosis of small artery: Secondary | ICD-10-CM | POA: Diagnosis present

## 2023-11-05 DIAGNOSIS — Z8679 Personal history of other diseases of the circulatory system: Secondary | ICD-10-CM | POA: Diagnosis not present

## 2023-11-05 DIAGNOSIS — I639 Cerebral infarction, unspecified: Secondary | ICD-10-CM | POA: Diagnosis not present

## 2023-11-05 DIAGNOSIS — I6523 Occlusion and stenosis of bilateral carotid arteries: Secondary | ICD-10-CM | POA: Diagnosis present

## 2023-11-05 DIAGNOSIS — K7689 Other specified diseases of liver: Secondary | ICD-10-CM | POA: Diagnosis present

## 2023-11-05 DIAGNOSIS — Z8042 Family history of malignant neoplasm of prostate: Secondary | ICD-10-CM | POA: Diagnosis not present

## 2023-11-05 DIAGNOSIS — I69391 Dysphagia following cerebral infarction: Secondary | ICD-10-CM | POA: Diagnosis not present

## 2023-11-05 DIAGNOSIS — I10 Essential (primary) hypertension: Secondary | ICD-10-CM

## 2023-11-05 DIAGNOSIS — D72829 Elevated white blood cell count, unspecified: Secondary | ICD-10-CM | POA: Diagnosis present

## 2023-11-05 DIAGNOSIS — I6389 Other cerebral infarction: Secondary | ICD-10-CM

## 2023-11-05 DIAGNOSIS — K760 Fatty (change of) liver, not elsewhere classified: Secondary | ICD-10-CM | POA: Diagnosis present

## 2023-11-05 DIAGNOSIS — Z800 Family history of malignant neoplasm of digestive organs: Secondary | ICD-10-CM | POA: Diagnosis not present

## 2023-11-05 DIAGNOSIS — Z85828 Personal history of other malignant neoplasm of skin: Secondary | ICD-10-CM | POA: Diagnosis not present

## 2023-11-05 DIAGNOSIS — Z1152 Encounter for screening for COVID-19: Secondary | ICD-10-CM | POA: Diagnosis not present

## 2023-11-05 DIAGNOSIS — R7401 Elevation of levels of liver transaminase levels: Secondary | ICD-10-CM

## 2023-11-05 DIAGNOSIS — Z79899 Other long term (current) drug therapy: Secondary | ICD-10-CM | POA: Diagnosis not present

## 2023-11-05 DIAGNOSIS — R748 Abnormal levels of other serum enzymes: Secondary | ICD-10-CM | POA: Diagnosis not present

## 2023-11-05 DIAGNOSIS — Z87891 Personal history of nicotine dependence: Secondary | ICD-10-CM | POA: Diagnosis not present

## 2023-11-05 DIAGNOSIS — I2510 Atherosclerotic heart disease of native coronary artery without angina pectoris: Secondary | ICD-10-CM | POA: Diagnosis present

## 2023-11-05 DIAGNOSIS — G8191 Hemiplegia, unspecified affecting right dominant side: Secondary | ICD-10-CM | POA: Diagnosis present

## 2023-11-05 DIAGNOSIS — E785 Hyperlipidemia, unspecified: Secondary | ICD-10-CM | POA: Diagnosis present

## 2023-11-05 DIAGNOSIS — R1310 Dysphagia, unspecified: Secondary | ICD-10-CM | POA: Diagnosis present

## 2023-11-05 DIAGNOSIS — I493 Ventricular premature depolarization: Secondary | ICD-10-CM | POA: Diagnosis present

## 2023-11-05 DIAGNOSIS — I4719 Other supraventricular tachycardia: Secondary | ICD-10-CM | POA: Diagnosis present

## 2023-11-05 DIAGNOSIS — Z8673 Personal history of transient ischemic attack (TIA), and cerebral infarction without residual deficits: Secondary | ICD-10-CM

## 2023-11-05 DIAGNOSIS — Z8249 Family history of ischemic heart disease and other diseases of the circulatory system: Secondary | ICD-10-CM | POA: Diagnosis not present

## 2023-11-05 DIAGNOSIS — Z7902 Long term (current) use of antithrombotics/antiplatelets: Secondary | ICD-10-CM | POA: Diagnosis not present

## 2023-11-05 DIAGNOSIS — E7849 Other hyperlipidemia: Secondary | ICD-10-CM

## 2023-11-05 LAB — ECHOCARDIOGRAM COMPLETE
Area-P 1/2: 3.89 cm2
Height: 68.4 in
S' Lateral: 2.5 cm
Weight: 2920.65 [oz_av]

## 2023-11-05 LAB — CBC
HCT: 40 % (ref 39.0–52.0)
Hemoglobin: 13.4 g/dL (ref 13.0–17.0)
MCH: 31.7 pg (ref 26.0–34.0)
MCHC: 33.5 g/dL (ref 30.0–36.0)
MCV: 94.6 fL (ref 80.0–100.0)
Platelets: 183 K/uL (ref 150–400)
RBC: 4.23 MIL/uL (ref 4.22–5.81)
RDW: 12.5 % (ref 11.5–15.5)
WBC: 10 K/uL (ref 4.0–10.5)
nRBC: 0 % (ref 0.0–0.2)

## 2023-11-05 LAB — RESP PANEL BY RT-PCR (RSV, FLU A&B, COVID)  RVPGX2
Influenza A by PCR: NEGATIVE
Influenza B by PCR: NEGATIVE
Resp Syncytial Virus by PCR: NEGATIVE
SARS Coronavirus 2 by RT PCR: NEGATIVE

## 2023-11-05 LAB — COMPREHENSIVE METABOLIC PANEL WITH GFR
ALT: 206 U/L — ABNORMAL HIGH (ref 0–44)
AST: 193 U/L — ABNORMAL HIGH (ref 15–41)
Albumin: 3.2 g/dL — ABNORMAL LOW (ref 3.5–5.0)
Alkaline Phosphatase: 110 U/L (ref 38–126)
Anion gap: 11 (ref 5–15)
BUN: 14 mg/dL (ref 8–23)
CO2: 24 mmol/L (ref 22–32)
Calcium: 8.6 mg/dL — ABNORMAL LOW (ref 8.9–10.3)
Chloride: 103 mmol/L (ref 98–111)
Creatinine, Ser: 1.05 mg/dL (ref 0.61–1.24)
GFR, Estimated: >60 mL/min (ref >60)
Glucose, Bld: 119 mg/dL — ABNORMAL HIGH (ref 70–99)
Potassium: 3.6 mmol/L (ref 3.5–5.1)
Sodium: 138 mmol/L (ref 135–145)
Total Bilirubin: 2.7 mg/dL — ABNORMAL HIGH (ref 0.0–1.2)
Total Protein: 5.9 g/dL — ABNORMAL LOW (ref 6.5–8.1)

## 2023-11-05 LAB — LIPID PANEL
Cholesterol: 91 mg/dL (ref 0–200)
HDL: 39 mg/dL — ABNORMAL LOW (ref >40)
LDL Cholesterol: 42 mg/dL (ref 0–99)
Total CHOL/HDL Ratio: 2.3 ratio
Triglycerides: 48 mg/dL (ref <150)
VLDL: 10 mg/dL (ref 0–40)

## 2023-11-05 LAB — HEPATITIS PANEL, ACUTE
HCV Ab: NONREACTIVE
Hep A IgM: NONREACTIVE
Hep B C IgM: NONREACTIVE
Hepatitis B Surface Ag: NONREACTIVE

## 2023-11-05 LAB — HEMOGLOBIN A1C
Hgb A1c MFr Bld: 5.3 % (ref 4.8–5.6)
Mean Plasma Glucose: 105.41 mg/dL

## 2023-11-05 MED ORDER — POTASSIUM CHLORIDE 10 MEQ/100ML IV SOLN
10.0000 meq | INTRAVENOUS | Status: AC
  Administered 2023-11-05 (×2): 10 meq via INTRAVENOUS
  Filled 2023-11-05 (×2): qty 100

## 2023-11-05 MED ORDER — METOPROLOL TARTRATE 5 MG/5ML IV SOLN: 5.0000 mg | INTRAVENOUS | Status: DC | PRN

## 2023-11-05 MED ORDER — SENNOSIDES-DOCUSATE SODIUM 8.6-50 MG PO TABS: 1.0000 | ORAL_TABLET | Freq: Every evening | ORAL | Status: DC | PRN

## 2023-11-05 MED ORDER — GUAIFENESIN 100 MG/5ML PO LIQD: 5.0000 mL | ORAL | Status: DC | PRN

## 2023-11-05 MED ORDER — ACETAMINOPHEN 160 MG/5ML PO SOLN: 650.0000 mg | ORAL | Status: DC | PRN

## 2023-11-05 MED ORDER — ACETAMINOPHEN 325 MG PO TABS
650.0000 mg | ORAL_TABLET | Freq: Four times a day (QID) | ORAL | Status: DC | PRN
  Administered 2023-11-05 – 2023-11-07 (×3): 650 mg via ORAL
  Filled 2023-11-05 (×3): qty 2

## 2023-11-05 MED ORDER — ONDANSETRON HCL 4 MG/2ML IJ SOLN
4.0000 mg | Freq: Four times a day (QID) | INTRAMUSCULAR | Status: DC | PRN
  Administered 2023-11-06: 4 mg via INTRAVENOUS
  Filled 2023-11-05: qty 2

## 2023-11-05 MED ORDER — POLYETHYLENE GLYCOL 3350 17 G PO PACK: 17.0000 g | PACK | Freq: Every day | ORAL | Status: DC | PRN

## 2023-11-05 MED ORDER — SODIUM CHLORIDE 0.9 % IV SOLN: INTRAVENOUS | Status: DC

## 2023-11-05 MED ORDER — IPRATROPIUM-ALBUTEROL 0.5-2.5 (3) MG/3ML IN SOLN: 3.0000 mL | RESPIRATORY_TRACT | Status: DC | PRN

## 2023-11-05 MED ORDER — SODIUM CHLORIDE 0.9 % IV BOLUS
500.0000 mL | Freq: Once | INTRAVENOUS | Status: AC
  Administered 2023-11-05: 500 mL via INTRAVENOUS

## 2023-11-05 MED ORDER — SODIUM CHLORIDE 0.9 % IV SOLN
INTRAVENOUS | Status: DC
Start: 2023-11-05 — End: 2023-11-05

## 2023-11-05 MED ORDER — STROKE: EARLY STAGES OF RECOVERY BOOK
Freq: Once | Status: AC
  Filled 2023-11-05: qty 1

## 2023-11-05 MED ORDER — PSYLLIUM 95 % PO PACK
1.0000 | PACK | Freq: Every day | ORAL | Status: DC
  Administered 2023-11-05 – 2023-11-07 (×3): 1 via ORAL
  Filled 2023-11-05 (×3): qty 1

## 2023-11-05 MED ORDER — IBUPROFEN 200 MG PO TABS
200.0000 mg | ORAL_TABLET | ORAL | Status: DC | PRN
  Administered 2023-11-05: 200 mg via ORAL
  Filled 2023-11-05: qty 1

## 2023-11-05 MED ORDER — SIMVASTATIN 20 MG PO TABS: 20.0000 mg | ORAL_TABLET | Freq: Every day | ORAL | Status: DC

## 2023-11-05 MED ORDER — METOPROLOL SUCCINATE ER 25 MG PO TB24: 12.5000 mg | ORAL_TABLET | Freq: Every day | ORAL | Status: DC

## 2023-11-05 MED ORDER — HYDRALAZINE HCL 20 MG/ML IJ SOLN: 10.0000 mg | INTRAMUSCULAR | Status: DC | PRN

## 2023-11-05 MED ORDER — ACETAMINOPHEN 325 MG PO TABS: 650.0000 mg | ORAL_TABLET | ORAL | Status: DC | PRN

## 2023-11-05 MED ORDER — ACETAMINOPHEN 650 MG RE SUPP: 650.0000 mg | RECTAL | Status: DC | PRN

## 2023-11-05 MED ORDER — ENOXAPARIN SODIUM 40 MG/0.4ML IJ SOSY
40.0000 mg | PREFILLED_SYRINGE | INTRAMUSCULAR | Status: DC
  Administered 2023-11-05 – 2023-11-06 (×2): 40 mg via SUBCUTANEOUS
  Filled 2023-11-05 (×2): qty 0.4

## 2023-11-05 MED ORDER — CLOPIDOGREL BISULFATE 75 MG PO TABS
75.0000 mg | ORAL_TABLET | Freq: Every day | ORAL | Status: DC
Start: 2023-11-05 — End: 2023-11-07
  Administered 2023-11-05 – 2023-11-07 (×3): 75 mg via ORAL
  Filled 2023-11-05 (×3): qty 1

## 2023-11-05 MED ORDER — ASPIRIN 81 MG PO TBEC
81.0000 mg | DELAYED_RELEASE_TABLET | Freq: Every day | ORAL | Status: DC
  Administered 2023-11-05 – 2023-11-07 (×3): 81 mg via ORAL
  Filled 2023-11-05 (×3): qty 1

## 2023-11-05 NOTE — Evaluation (Signed)
 Physical Therapy Evaluation Patient Details Name: Robert Pacheco MRN: [id number] DOB: 06-24-1933 Today's Date: 11/05/2023  History of Present Illness  89 year old male presented with facial droop and right-sided weakness. + Acute ischemic CVA.SABRA PMH: TIA, CAD status post CABG 2000, HTN, paroxysmal atrial tachycardia on Zio patch, HLD.  Clinical Impression  Pt admitted with above diagnosis. Independent PTA, one fall in July per family. Ambulating slowly today but without overt LOB. Furniture surfing with a little improvement noted as distance increases. May benefit from [facility] trial next visit. Supervision available from daughters at d/c. Benefit from OPPT, has some LE weakness with functional tasks. Pt currently with functional limitations due to the deficits listed below (see PT Problem List). Pt will benefit from acute skilled PT to increase their independence and safety with mobility to allow discharge.           If plan is discharge home, recommend the following: A little help with walking and/or transfers;Assistance with cooking/housework;Help with stairs or ramp for entrance;Assist for transportation   Can travel by private vehicle        Equipment Recommendations None recommended by PT  Recommendations for Other Services       Functional Status Assessment Patient has had a recent decline in their functional status and demonstrates the ability to make significant improvements in function in a reasonable and predictable amount of time.     Precautions / Restrictions Precautions Precautions: Fall Restrictions Weight Bearing Restrictions Per Provider Order: No      Mobility  Bed Mobility Overal bed mobility: Needs Assistance Bed Mobility: Rolling, Sidelying to Sit, Sit to Supine Rolling: Modified independent (Device/Increase time) Sidelying to sit: Supervision   Sit to supine: Supervision   General bed mobility comments: Supervision to rise and return to supine Effortful to  lift LEs onto stretcher but no physical assist required.    Transfers Overall transfer level: Needs assistance Equipment used: None Transfers: Sit to/from Stand Sit to Stand: Contact guard assist           General transfer comment: CGA for safety rising from stretcher. Pt reaching for furniture to steady. Performed a couple more times with improvement in ability, no physical assist.    Ambulation/Gait Ambulation/Gait assistance: Contact guard assist Gait Distance (Feet): 60 Feet Assistive device: None Gait Pattern/deviations: Step-through pattern, Decreased stride length, Decreased weight shift to left, Decreased stance time - right, Antalgic Gait velocity: dec Gait velocity interpretation: <1.31 ft/sec, indicative of household ambulator   General Gait Details: Mild instability, initially touching furniture in room but improved with several laps across room and back following cues for awareness. Reduced stability when WB through RLE but no overt buckling or LOB. Educated on awareness and safety. May benefit from cane next visit.  Stairs            Wheelchair Mobility     Tilt Bed    Modified Rankin (Stroke Patients Only) Modified Rankin (Stroke Patients Only) Pre-Morbid Rankin Score: No significant disability Modified Rankin: Moderate disability     Balance Overall balance assessment: Needs assistance Sitting-balance support: Feet supported, No upper extremity supported Sitting balance-Leahy Scale: Good     Standing balance support: No upper extremity supported, During functional activity Standing balance-Leahy Scale: Fair                   Standardized Balance Assessment Standardized Balance Assessment : Berg Balance Test Berg Balance Test Sit to Stand: Able to stand  independently using hands Standing Unsupported: Able  to stand safely 2 minutes Sitting with Back Unsupported but Feet Supported on Floor or Stool: Able to sit safely and securely 2  minutes Stand to Sit: Sits safely with minimal use of hands Transfers: Able to transfer with verbal cueing and /or supervision Standing Unsupported with Eyes Closed: Able to stand 10 seconds safely Standing Ubsupported with Feet Together: Able to place feet together independently and stand 1 minute safely From Standing, Reach Forward with Outstretched Arm: Can reach confidently >25 cm (10) From Standing Position, Pick up Object from Floor: Able to pick up shoe, needs supervision From Standing Position, Turn to Look Behind Over each Shoulder: Looks behind one side only/other side shows less weight shift Turn 360 Degrees: Able to turn 360 degrees safely but slowly Standing Unsupported, Alternately Place Feet on Step/Stool: Able to complete 4 steps without aid or supervision Standing Unsupported, One Foot in Front: Able to plae foot ahead of the other independently and hold 30 seconds Standing on One Leg: Able to lift leg independently and hold equal to or more than 3 seconds Total Score: 44         Pertinent Vitals/Pain Pain Assessment Pain Assessment: No/denies pain    Home Living Family/patient expects to be discharged to:: Private residence Living Arrangements: Alone Available Help at Discharge: Family;Available 24 hours/day (daughters can help but not physically) Type of Home: House Home Access: Stairs to enter Entrance Stairs-Rails: None Entrance Stairs-Number of Steps: 2   Home Layout: One level Home Equipment: Grab bars - tub/shower      Prior Function Prior Level of Function : Independent/Modified Independent;Driving;History of Falls (last six months)             Mobility Comments: ind, works on car, lots of hobbies. One fall in July ADLs Comments: ind     Extremity/Trunk Assessment   Upper Extremity Assessment Upper Extremity Assessment: Defer to OT evaluation    Lower Extremity Assessment Lower Extremity Assessment: Generalized weakness        Communication   Communication Communication: Impaired Factors Affecting Communication: Hearing impaired    Cognition Arousal: Lethargic Behavior During Therapy: WFL for tasks assessed/performed   PT - Cognitive impairments: No apparent impairments                         Following commands: Intact       Cueing Cueing Techniques: Verbal cues     General Comments General comments (skin integrity, edema, etc.): Wife present, supportive.VSS    Exercises     Assessment/Plan    PT Assessment Patient needs continued PT services  PT Problem List         PT Treatment Interventions      PT Goals (Current goals can be found in the Care Plan section)  Acute Rehab PT Goals Patient Stated Goal: Get well, return home PT Goal Formulation: With patient Time For Goal Achievement: 11/19/23 Potential to Achieve Goals: Good    Frequency       Co-evaluation               AM-PAC PT 6 Clicks Mobility  Outcome Measure Help needed turning from your back to your side while in a flat bed without using bedrails?: None Help needed moving from lying on your back to sitting on the side of a flat bed without using bedrails?: A Little Help needed moving to and from a bed to a chair (including a wheelchair)?: A Little Help needed standing  up from a chair using your arms (e.g., wheelchair or bedside chair)?: A Little Help needed to walk in hospital room?: A Little Help needed climbing 3-5 steps with a railing? : A Little 6 Click Score: 19    End of Session Equipment Utilized During Treatment: Gait belt Activity Tolerance: Patient tolerated treatment well Patient left: with call bell/phone within reach;with family/visitor present Tax inspector) Nurse Communication: Mobility status PT Visit Diagnosis: Unsteadiness on feet (R26.81);Other abnormalities of gait and mobility (R26.89);Muscle weakness (generalized) (M62.81);History of falling (Z91.81);Difficulty in walking, not  elsewhere classified (R26.2);Other symptoms and signs involving the nervous system (R29.898)    Time: 8644-8570 PT Time Calculation (min) (ACUTE ONLY): 34 min   Charges:   PT Evaluation $PT Eval Low Complexity: 1 Low PT Treatments $Therapeutic Activity: 8-22 mins PT General Charges $$ ACUTE PT VISIT: 1 Visit         Leontine Roads, PT, DPT [facility]  Rehabilitation Services Physical Therapist Office: [phone] Website: [email or web address]   Leontine GORMAN Roads 11/05/2023, 3:31 PM

## 2023-11-05 NOTE — Plan of Care (Signed)
  Problem: Education: Goal: Knowledge of disease or condition will improve Outcome: Progressing Goal: Knowledge of secondary prevention will improve (MUST DOCUMENT ALL) Outcome: Progressing Goal: Knowledge of patient specific risk factors will improve (DELETE if not current risk factor) Outcome: Progressing   Problem: Ischemic Stroke/TIA Tissue Perfusion: Goal: Complications of ischemic stroke/TIA will be minimized Outcome: Progressing   Problem: Coping: Goal: Will verbalize positive feelings about self Outcome: Progressing Goal: Will identify appropriate support needs Outcome: Progressing   Problem: Health Behavior/Discharge Planning: Goal: Ability to manage health-related needs will improve Outcome: Progressing Goal: Goals will be collaboratively established with patient/family Outcome: Progressing   Problem: Self-Care: Goal: Ability to participate in self-care as condition permits will improve Outcome: Progressing   Problem: Health Behavior/Discharge Planning: Goal: Ability to manage health-related needs will improve Outcome: Progressing Goal: Goals will be collaboratively established with patient/family Outcome: Progressing   Problem: Coping: Goal: Will verbalize positive feelings about self Outcome: Progressing Goal: Will identify appropriate support needs Outcome: Progressing   Problem: Education: Goal: Knowledge of disease or condition will improve Outcome: Progressing Goal: Knowledge of secondary prevention will improve (MUST DOCUMENT ALL) Outcome: Progressing Goal: Knowledge of patient specific risk factors will improve (DELETE if not current risk factor) Outcome: Progressing

## 2023-11-05 NOTE — Consult Note (Addendum)
 Consultation  Referring Provider:  [facility]/ [facility] Primary Care Physician:  [name], DO Primary Gastroenterologist:  unassigned  Reason for Consultation: Elevated LFTs  HPI: Robert Pacheco is a 89 y.o. male with history of coronary artery disease, hyperlipidemia, hypertension, previous TIA.  He is status post remote CABG and also has history of paroxysmal atrial tachycardia. Patient presented to the emergency room yesterday afternoon after sudden onset of right-sided facial droop and right-sided weakness. He had been seen early in the morning hours yesterday with complaints of abdominal pain which he described as a general belly ache.  This was associated with nausea and vomiting.  He did not have any associated fever or chills, denies headache, congestion, sore throat, no diarrhea or melena.  He was able to have a bowel movement in the emergency room and then stated that his abdominal pain had improved and he was allowed discharge to home. Family had noted that he had had a similar episode in May 2025 at which time he was thought to have had a TIA, he had negative MRI at that time and family says that he had been complaining of abdominal pain with that episode as well.  He says other than the episode yesterday has not been having any recent abdominal pain, or nausea.  He feels better this morning.  He was able to eat solid food this morning and for dinner last evening.  When presented last evening he underwent further workup with code stroke called. CT of the head yesterday showed no acute hemorrhage no evidence of acute infarct, patchy white matter hypodensities consistent with chronic microvascular ischemic change noted. 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  This morning he still has a slight right facial droop, speech is slow but appropriate no significant dysarthria.  He says that the right sided weakness has improved.  Labs  yesterday morning CBC unremarkable Lipase 52 Na 140/ K3 .8/Creat 0.98 LFTs normal with the exception of AST at 63 Rapid drug screen negative EtOH negative  Repeat labs last p.m. after he presented with WBC 12.1/hemoglobin 13.9/hematocrit 41.3/platelets 192 Potassium 3.4/BUN 15/creatinine 1.0/T. bili 2.8/AST 289/ALT 243/alk phos 119 INR 1.0   Repeat labs today CBC normal T. bili 2.7/alk phos 110/AST 193/ALT 206 improved Hepatitis panel pending  CT abdomen and pelvis without contrast-multiple hepatic cysts similar to previous normal-appearing gallbladder unremarkable pancreas , no ductal dilation  Abdominal ultrasound-no gallstones or gallbladder wall thickening CBD 3 mm, multiple simple hepatic cysts, increased parenchymal echogenicity, portal vein patent  Review of chart shows patient was not hypotensive at any point documented yesterday. No prior history of liver disease, no EtOH No GI symptoms this morning.   Past Medical History:  Diagnosis Date   Allergy    Coronary artery disease    Hyperlipidemia    Hypertension    Personal history of colonic adenomas 05/10/2012   Skin cancer, basal cell     Past Surgical History:  Procedure Laterality Date   CATARACT EXTRACTION     COLONOSCOPY  multiple   CORONARY ARTERY BYPASS GRAFT  2000   EYE SURGERY     HERNIA REPAIR     MOHS SURGERY      Prior to Admission medications   Medication Sig Start Date End Date Taking? Authorizing Provider  acetaminophen  (TYLENOL ) 500 MG tablet Take 500 mg by mouth daily as needed for mild pain (pain score 1-3).   Yes [provider]  aspirin   81 MG chewable tablet Chew 1 tablet (81 mg total) by mouth daily. 09/10/20  Yes Tobb, Kardie, DO  Loratadine (CLARITIN PO) Take 1 tablet by mouth as needed.   Yes [provider]  meloxicam  (MOBIC ) 7.5 MG tablet Take 1 tablet (7.5 mg total) by mouth daily. 09/21/23  Yes [name], DO  metoprolol  succinate (TOPROL -XL) 25 MG 24 hr  tablet Take 0.5 tablets (12.5 mg total) by mouth at bedtime. 06/12/23  Yes Wendling, [name], DO  Omega-3 Fatty Acids (FISH OIL) 1000 MG CAPS Take 1 capsule by mouth in the morning.   Yes [provider]  polyethylene glycol (MIRALAX / GLYCOLAX) 17 g packet Take 17 g by mouth daily as needed for mild constipation.   Yes [provider]  psyllium (HYDROCIL/METAMUCIL) 95 % PACK Take 1 packet by mouth daily.   Yes [provider]  simvastatin  (ZOCOR ) 20 MG tablet Take 1 tablet (20 mg total) by mouth daily at 6 PM. 08/11/23  Yes Wendling, [name], DO  NITROSTAT  0.4 MG SL tablet DISSOLVE ONE TABLET UNDER THE TONGUE EVERY 5 MINUTES AS NEEDED. 09/21/23   [name], DO    Current Facility-Administered Medications  Medication Dose Route Frequency Provider Last Rate Last Admin   [START ON 11/06/2023]  stroke: early stages of recovery book   Does not apply Once Sundil, Subrina, MD       acetaminophen  (TYLENOL ) tablet 650 mg  650 mg Oral Q6H PRN Amin, Ankit C, MD       aspirin  EC tablet 81 mg  81 mg Oral Daily Sundil, Subrina, MD   81 mg at 11/05/23 1037   clopidogrel (PLAVIX) tablet 75 mg  75 mg Oral Daily Sundil, Subrina, MD   75 mg at 11/05/23 1037   enoxaparin (LOVENOX) injection 40 mg  40 mg Subcutaneous Q24H Sundil, Subrina, MD       guaiFENesin (ROBITUSSIN) 100 MG/5ML liquid 5 mL  5 mL Oral Q4H PRN Amin, Ankit C, MD       hydrALAZINE (APRESOLINE) injection 10 mg  10 mg Intravenous Q4H PRN Amin, Ankit C, MD       ipratropium-albuterol (DUONEB) 0.5-2.5 (3) MG/3ML nebulizer solution 3 mL  3 mL Nebulization Q4H PRN Amin, Ankit C, MD       metoprolol  tartrate (LOPRESSOR ) injection 5 mg  5 mg Intravenous Q4H PRN Amin, Ankit C, MD       ondansetron (ZOFRAN) injection 4 mg  4 mg Intravenous Q6H PRN Amin, Ankit C, MD       polyethylene glycol (MIRALAX / GLYCOLAX) packet 17 g  17 g Oral Daily PRN Sundil, Subrina, MD       psyllium (HYDROCIL/METAMUCIL) 1 packet   1 packet Oral Daily Sundil, Subrina, MD   1 packet at 11/05/23 1037   senna-docusate (Senokot-S) tablet 1 tablet  1 tablet Oral QHS PRN Amin, Ankit C, MD       Current Outpatient Medications  Medication Sig Dispense Refill   acetaminophen  (TYLENOL ) 500 MG tablet Take 500 mg by mouth daily as needed for mild pain (pain score 1-3).     aspirin  81 MG chewable tablet Chew 1 tablet (81 mg total) by mouth daily. 90 tablet 3   Loratadine (CLARITIN PO) Take 1 tablet by mouth as needed.     meloxicam  (MOBIC ) 7.5 MG tablet Take 1 tablet (7.5 mg total) by mouth daily. 30 tablet 0   metoprolol  succinate (TOPROL -XL) 25 MG 24 hr tablet Take  0.5 tablets (12.5 mg total) by mouth at bedtime. 45 tablet 2   Omega-3 Fatty Acids (FISH OIL) 1000 MG CAPS Take 1 capsule by mouth in the morning.     polyethylene glycol (MIRALAX / GLYCOLAX) 17 g packet Take 17 g by mouth daily as needed for mild constipation.     psyllium (HYDROCIL/METAMUCIL) 95 % PACK Take 1 packet by mouth daily.     simvastatin  (ZOCOR ) 20 MG tablet Take 1 tablet (20 mg total) by mouth daily at 6 PM. 90 tablet 0   NITROSTAT  0.4 MG SL tablet DISSOLVE ONE TABLET UNDER THE TONGUE EVERY 5 MINUTES AS NEEDED. 25 tablet 1    Allergies as of 11/04/2023 - Review Complete 11/04/2023  Allergen Reaction Noted   Other Nausea Only 06/06/2014    Family History  Problem Relation Age of Onset   Heart disease Father    Colon cancer Brother 76   Healthy Child        x 4   Healthy Grandchild        x 11   Colon cancer Sister 40   Prostate cancer Brother     Social History   Socioeconomic History   Marital status: Widowed    Spouse name: Not on file   Number of children: 4   Years of education: Not on file   Highest education level: Not on file  Occupational History   Occupation: retired    Comment: Regulatory affairs officer  Tobacco Use   Smoking status: Former   Smokeless tobacco: Never  Advertising account planner   Vaping status: Never Used  Substance and Sexual  Activity   Alcohol use: No   Drug use: No   Sexual activity: Not on file  Other Topics Concern   Not on file  Social History Narrative   Are you right handed or left handed? Right handed   Are you currently employed ? No    What is your current occupation? Retired   Do you live at home alone? Yes    Who lives with you?    What type of home do you live in: 1 story or 2 story? Lives in a one story home. Home has an attic        Social Drivers of Health   Financial Resource Strain: Low Risk  (03/26/2023)   Overall Financial Resource Strain (CARDIA)    Difficulty of Paying Living Expenses: Not hard at all  Food Insecurity: No Food Insecurity (03/26/2023)   Hunger Vital Sign    Worried About Running Out of Food in the Last Year: Never true    Ran Out of Food in the Last Year: Never true  Transportation Needs: No Transportation Needs (03/26/2023)   PRAPARE - Administrator, Civil Service (Medical): No    Lack of Transportation (Non-Medical): No  Physical Activity: Inactive (03/26/2023)   Exercise Vital Sign    Days of Exercise per Week: 0 days    Minutes of Exercise per Session: 0 min  Stress: No Stress Concern Present (03/26/2023)   [employer] of Occupational Health - Occupational Stress Questionnaire    Feeling of Stress : Not at all  Social Connections: Moderately Integrated (03/26/2023)   Social Connection and Isolation Panel    Frequency of Communication with Friends and Family: More than three times a week    Frequency of Social Gatherings with Friends and Family: More than three times a week    Attends Religious Services: More than  4 times per year    Active Member of Clubs or Organizations: Yes    Attends Banker Meetings: More than 4 times per year    Marital Status: Widowed  Intimate Partner Violence: Not At Risk (03/26/2023)   Humiliation, Afraid, Rape, and Kick questionnaire    Fear of Current or Ex-Partner: No    Emotionally Abused: No     Physically Abused: No    Sexually Abused: No    Review of Systems: Pertinent positive and negative review of systems were noted in the above HPI section.  All other review of systems was otherwise negative.  Physical Exam: Vital signs in last 24 hours: Temp:  [97.8 F (36.6 C)-98.5 F (36.9 C)] 97.8 F (36.6 C) (10/09 0745) Pulse Rate:  [52-90] 73 (10/09 0915) Resp:  [11-25] 20 (10/09 0915) BP: (109-154)/(52-92) 125/70 (10/09 0915) SpO2:  [98 %-100 %] 99 % (10/09 0915) Weight:  [82.8 kg] 82.8 kg (10/08 2021)   General:   Alert,  Well-developed, well-nourished, elderly white male pleasant and cooperative in NAD family at bedside Head:  Normocephalic and atraumatic. Eyes:  Sclera clear, no icterus.   Conjunctiva pink. Ears:  Normal auditory acuity. Nose:  No deformity, discharge,  or lesions. Mouth: Slight right facial droop Neck:  Supple; no masses or thyromegaly. Lungs:  Clear throughout to auscultation.   No wheezes, crackles, or rhonchi.  Heart:  Regular rate and rhythm; no murmurs, clicks, rubs,  or gallops. Abdomen:  Soft,nontender, BS active,nonpalp mass or hsm.   Rectal: Not done Msk:  Symmetrical without gross deformities. . Pulses:  Normal pulses noted. Extremities:  Without clubbing or edema. Neurologic:  Alert and  oriented x4; slight right facial droop, extremity testing not done, speech slow but appropriate no dysarthria Skin:  Intact without significant lesions or rashes.. Psych:  Alert and cooperative. Normal mood and affect.  Intake/Output from previous day: No intake/output data recorded. Intake/Output this shift: No intake/output data recorded.  Lab Results: Recent Labs    11/04/23 0942 11/04/23 1956 11/04/23 1957 11/05/23 0340  WBC 9.3  --  12.1* 10.0  HGB 14.4 13.9 13.9 13.4  HCT 43.2 41.0 41.3 40.0  PLT 199  --  192 183   BMET Recent Labs    11/04/23 0942 11/04/23 1956 11/04/23 1957 11/05/23 0340  NA 140 140 139 138  K 3.8 3.4* 3.4* 3.6   CL 107 104 105 103  CO2 21*  --  22 24  GLUCOSE 149* 123* 121* 119*  BUN 15 16 15 14   CREATININE 0.98 0.90 1.00 1.05  CALCIUM 8.7*  --  8.7* 8.6*   LFT Recent Labs    11/05/23 0340  PROT 5.9*  ALBUMIN 3.2*  AST 193*  ALT 206*  ALKPHOS 110  BILITOT 2.7*   PT/INR Recent Labs    11/04/23 1957  LABPROT 14.0  INR 1.0   Hepatitis Panel No results for input(s): HEPBSAG, HCVAB, HEPAIGM, HEPBIGM in the last 72 hours.    IMPRESSION:  #23 89 year old white male with onset yesterday morning with abdominal pain nausea and vomiting.  Had presented to the emergency room after onset of symptoms but then was able to have a bowel movement while in the ER and after that symptoms had resolved.  He was allowed discharge to home then was brought back to the emergency room last evening after acute onset of right sided facial droop and right-sided weakness.  Neurowork-up with MRI pertinent for small acute perforator infarct in  the left basal ganglia. Started on Plavix and aspirin     #2 elevated LFTs-initial labs early yesterday morning normal with the exception of a minimally elevated AST of 63 Repeat labs last p.m. with T. bili 2.8/AST 289/ALT 243/alk phos 119  Labs improving this a.m. Noncontrasted CT shows multiple hepatic cysts unchanged from previous otherwise unremarkable Abdominal ultrasound with no evidence for cholelithiasis, no ductal dilation, hepatic steatosis noted.  Initial symptoms of abdominal pain nausea and vomiting have resolved  Correlation between the acute CVA and the episode of abdominal pain with nausea vomiting is not clear. Patient has not been hypotensive with this event Mesenteric vasculature not well assessed with noncontrasted CT  #3  History of previous TIA #4 coronary artery disease status post previous CABG #5 history of hypertension #6 hyperlipidemia  PLAN: Heart healthy diet as tolerates Continue to trend LFTs Follow-up acute hepatitis  panel, also added EBV CMV and will rule out COVID/flu all of which can be associated with elevated LFTs/abdominal pain  Will discuss potential further imaging of mesenteric vasculature   Amy Esterwood PA-C 11/05/2023, 11:13 AM     ATTENDING ADDENDUM 89 year old male with history of prior TIA, CAD status post CABG, presented to the hospital with right-sided facial droop and right-sided weakness.  Similar episode this past May.  MRI on admission shows a small acute perforator infarct of the left basal ganglia.  Incidentally noted on his labs he was found to have elevated liver enzymes with an AST of 289, ALT 243, total bili 2.8.  Historically his liver enzymes have been normal.  This morning when rechecked bilirubin is 2.7, ALT 206, AST 193, improved.  Denies any abdominal pain at this time although did have some abdominal pain recently.  CT scan abdomen pelvis shows some liver cysts within normal gallbladder, no ductal dilation.  Right upper quadrant ultrasound shows hepatic steatosis, no masses, no gallstones, patent portal vein.  Family and patient deny any new medications, no supplements etc.  He does not drink any alcohol.  No reported hypotension in the hospital.  Overall, acute CVA, incidentally noted to have transaminitis with mild bilirubinemia.  Unclear etiology.  His imaging does not show anything concerning, no gallstones.  Sending acute viral serologies.  Otherwise we will trend LFTs for now.  Hopefully these continue to downtrend on their own and if that occurs nothing further is likely needed.  If it persists or worsens over time can further workup.  Will await his LFTs tomorrow with further recommendations.  Call with questions in the interim.  I personally saw the patient and performed a substantive portion of this encounter (>50% time spent), including MDM.  Marcey Naval, MD [facility]

## 2023-11-05 NOTE — Progress Notes (Addendum)
 STROKE TEAM PROGRESS NOTE    SIGNIFICANT HOSPITAL EVENTS 89 year old male with past medical history of HTN, CAD s/p CABG, HLD, atrial tachycardia was seen in ED for abdominal pain, right sided numbness, and right facial droop. Abd pain resolved after bowel movement, and focal symptoms resolved. Patient was discharged before returning to ED as code stroke w sudden onset right sided weakness and right facial droop. Pertinent physical exam findings included mild R facial droop, slurred speech and slight right arm drift. CTH negative, CTA showed 50% stenosis of left ICA proximally due to atherosclerosis and right vertebral artery origin stenosis. Patient's symptoms too mild to treat with TNK. He received neuro checks Q95mins until outside tnkase window.  INTERIM HISTORY/SUBJECTIVE Patient lying in bed in NAD. He was having echo completed during encounter. He is awake and alert and able to answer questions and follow commands. He states that he lives alone at home but has family nearby.   OBJECTIVE  CBC    Component Value Date/Time   WBC 10.0 11/05/2023 0340   RBC 4.23 11/05/2023 0340   HGB 13.4 11/05/2023 0340   HCT 40.0 11/05/2023 0340   PLT 183 11/05/2023 0340   MCV 94.6 11/05/2023 0340   MCH 31.7 11/05/2023 0340   MCHC 33.5 11/05/2023 0340   RDW 12.5 11/05/2023 0340   LYMPHSABS 0.3 (L) 11/04/2023 1957   MONOABS 0.9 11/04/2023 1957   EOSABS 0.0 11/04/2023 1957   BASOSABS 0.0 11/04/2023 1957    BMET    Component Value Date/Time   NA 138 11/05/2023 0340   NA 143 07/11/2020 1444   K 3.6 11/05/2023 0340   CL 103 11/05/2023 0340   CO2 24 11/05/2023 0340   GLUCOSE 119 (H) 11/05/2023 0340   BUN 14 11/05/2023 0340   BUN 18 07/11/2020 1444   CREATININE 1.05 11/05/2023 0340   CREATININE 1.03 10/25/2019 0816   CALCIUM 8.6 (L) 11/05/2023 0340   EGFR 78 07/11/2020 1444   GFRNONAA >60 11/05/2023 0340    IMAGING past 24 hours MR BRAIN WO CONTRAST Result Date: 11/05/2023 EXAM: MR  Brain without Intravenous Contrast. CLINICAL HISTORY: Acute CVA. TECHNIQUE: Magnetic resonance images of the brain without intravenous contrast in multiple planes. CONTRAST: Without. COMPARISON: MRI head Jun 08, 2023 FINDINGS: BRAIN: Small acute perforator infarct in the left basal ganglia. Moderate T2/FLAIR hyperintensities in the white matter, compatible with chronic microvascular ischemic change. No intracranial mass or hemorrhage. No midline shift or extra-axial fluid collection. No cerebellar tonsillar ectopia. The central arterial and venous flow voids are patent. VENTRICLES: No hydrocephalus. ORBITS: The orbits are normal. SINUSES AND MASTOIDS: The sinuses and mastoid air cells are clear. BONES: No acute fracture or focal osseous lesion. IMPRESSION: 1. Small acute perforator infarct in the left basal ganglia. Electronically signed by: Gilmore Molt MD 11/05/2023 03:15 AM EDT RP Workstation: [id number]   CT ABDOMEN PELVIS WO CONTRAST Result Date: 11/04/2023 CLINICAL DATA:  Acute abdominal pain EXAM: CT ABDOMEN AND PELVIS WITHOUT CONTRAST TECHNIQUE: Multidetector CT imaging of the abdomen and pelvis was performed following the standard protocol without IV contrast. RADIATION DOSE REDUCTION: This exam was performed according to the departmental dose-optimization program which includes automated exposure control, adjustment of the mA and/or kV according to patient size and/or use of iterative reconstruction technique. COMPARISON:  06/08/2023 FINDINGS: Lower chest: No acute abnormality. Hepatobiliary: Liver again demonstrates multiple cysts similar to that seen on the prior exam. Gallbladder demonstrates a normal appearance. No definitive hepatic mass is seen. Pancreas: Unremarkable.  No pancreatic ductal dilatation or surrounding inflammatory changes. Spleen: Normal in size without focal abnormality. Adrenals/Urinary Tract: Adrenal glands are within normal limits. 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 Evaluation for calculi is limited due to contrast enhancement from prior CT of head. No obstructive changes are seen. The bladder is well distended with opacified urine. Indentation upon the inferior aspect of the bladder related to the prostate is noted. Stomach/Bowel: No obstructive or inflammatory changes of colon are noted. Mild fecal material is seen throughout the colon. The appendix is within normal limits. Small bowel and stomach are unremarkable. Vascular/Lymphatic: Aortic atherosclerosis. No enlarged abdominal or pelvic lymph nodes. Reproductive: Prostate is enlarged in size. Other: No abdominal wall hernia or abnormality. No abdominopelvic ascites. Musculoskeletal: No acute or significant osseous findings. IMPRESSION: Chronic changes as described above.  No acute abnormality is noted. Electronically Signed   By: Oneil Devonshire M.D.   On: 11/04/2023 23:52   US  Abdomen Limited RUQ (LIVER/GB) Result Date: 11/04/2023 CLINICAL DATA:  [id number] Transaminitis [id number] EXAM: ULTRASOUND ABDOMEN LIMITED RIGHT UPPER QUADRANT COMPARISON:  CT abdomen pelvis 11/04/2023 FINDINGS: Gallbladder: No gallstones or wall thickening visualized. No sonographic Murphy sign noted by sonographer. Common bile duct: Diameter: 3mm Liver: Multiple simple hepatic cysts. Increased parenchymal echogenicity. Portal vein is patent on color Doppler imaging with normal direction of blood flow towards the liver. Other: None. IMPRESSION: Hepatic steatosis. Please note limited evaluation for focal hepatic masses in a patient with hepatic steatosis due to decreased penetration of the acoustic ultrasound waves. Electronically Signed   By: Morgane  Naveau M.D.   On: 11/04/2023 23:21   CT ANGIO HEAD NECK W WO CM (CODE STROKE) Result Date: 11/04/2023 EXAM: CT HEAD WITHOUT CTA HEAD AND NECK WITH AND WITHOUT 11/04/2023 08:03:44 PM TECHNIQUE: CTA of the head and neck was performed with and without the administration of  intravenous contrast. Noncontrast CT of the head with reconstructed 2-D images are also provided for review. Multiplanar 2D and/or 3D reformatted images are provided for review. Automated exposure control, iterative reconstruction, and/or weight based adjustment of the mA/kV was utilized to reduce the radiation dose to as low as reasonably achievable. COMPARISON: None available CLINICAL HISTORY: Neuro deficit, acute, stroke suspected. Right arm drift; Facial droop. FINDINGS: AORTIC ARCH AND ARCH VESSELS: No dissection or arterial injury. No significant stenosis of the brachiocephalic or subclavian arteries. CERVICAL CAROTID ARTERIES: No dissection, arterial injury, or hemodynamically significant stenosis of the right carotid. Approximately 50% stenosis of the left ICA proximally due to atherosclerosis. CERVICAL VERTEBRAL ARTERIES: Severe right vertebral artery origin stenosis. Moderate stenosis of the distal right vertebral artery near its dural margin. LUNGS AND MEDIASTINUM: Unremarkable. SOFT TISSUES: No acute abnormality. BONES: No acute abnormality. ANTERIOR CIRCULATION: No significant stenosis of the internal carotid arteries. No significant stenosis of the anterior cerebral arteries. No significant stenosis of the middle cerebral arteries. No aneurysm. POSTERIOR CIRCULATION: No significant stenosis of the posterior cerebral arteries. No significant stenosis of the basilar artery. No significant stenosis of the vertebral arteries. No aneurysm. OTHER: No dural venous sinus thrombosis on this non-dedicated study. IMPRESSION: 1. Approximately 50% stenosis of the left ICA proximally due to atherosclerosis. 2. Severe right vertebral artery origin stenosis and moderate stenosis of the distal right vertebral artery near its dural margin. Electronically signed by: Gilmore Molt MD 11/04/2023 08:26 PM EDT RP Workstation: [id number]   CT HEAD CODE STROKE WO CONTRAST Result Date: 11/04/2023 EXAM: CT HEAD WITHOUT  CONTRAST 11/04/2023  08:02:33 PM TECHNIQUE: CT of the head was performed without the administration of intravenous contrast. Automated exposure control, iterative reconstruction, and/or weight based adjustment of the mA/kV was utilized to reduce the radiation dose to as low as reasonably achievable. COMPARISON: None available. CLINICAL HISTORY: Neuro deficit, acute, stroke suspected. No contrast; CT HEAD CODE STROKE WO CONTRAST FINDINGS: BRAIN AND VENTRICLES: No acute hemorrhage. No evidence of acute infarct. No hydrocephalus. No extra-axial collection. No mass effect or midline shift. Patchy white matter hypodensities are compatible with chronic microvascular ischemic change. ORBITS: No acute abnormality. SINUSES: No acute abnormality. SOFT TISSUES AND SKULL: No acute soft tissue abnormality. No skull fracture. IMPRESSION: 1. No acute intracranial abnormality. Findings conveyed to Dr. Vanessa via telephone at 8:09 PM. Electronically signed by: Gilmore Molt MD 11/04/2023 08:10 PM EDT RP Workstation: [id number]    Vitals:   11/05/23 0445 11/05/23 0615 11/05/23 0745 11/05/23 0915  BP: 135/75 (!) 148/73  125/70  Pulse: 90 84 83 73  Resp: 19 (!) 22 (!) 25 20  Temp:   97.8 F (36.6 C)   TempSrc:   Oral   SpO2: 100% 100% 100% 99%  Weight:      Height:         PHYSICAL EXAM General:  Alert, well-nourished, well-developed patient in no acute distress Psych:  Mood and affect appropriate for situation CV: Regular rate and rhythm on monitor Respiratory:  Regular, unlabored respirations on room air GI: Abdomen soft and nontender   NEURO:  Mental Status: AA&Ox3, patient is able to give clear and coherent history Speech/Language: speech is with some mild dysarthria. Fluency, and comprehension intact.  Cranial Nerves:  II: PERRL. Visual fields full.  III, IV, VI: EOMI. Eyelids elevate symmetrically.  V: Sensation is intact to light touch and symmetrical to face.  VII: Subtle facial  asymmetry. VIII: hearing intact to voice. IX, X: Phonation is normal.  XII: tongue is midline without fasciculations. Motor: 5/5 strength to all muscle groups tested.  Diminished fine finger movements on the right.  Albeit left over right upper extremity. Tone: is normal and bulk is normal Sensation- Intact to light touch bilaterally. Extinction absent to light touch to DSS.   Coordination: No drift. No overt ataxia. Gait- deferred  Most Recent NIH 3     ASSESSMENT/PLAN  [name] is a 89 y.o. male with history of CAD, HTN, HLD admitted for stuttering lacunar infarct vs TIA.  NIH on Admission 3  Acute Ischemic Infarct:  left basal ganglia  Etiology:  small disease   Code Stroke CT head No acute abnormality.   CTA head & neck: approximately 50% stenosis of the left ICA proximally due to atherosclerosis and severe right vertebral artery origin stenosis and moderate stenosis of the distal right vertebral artery near its dural margin. MRI: Small acute perforator infarct in the left basal ganglia.  2D Echo EF 60-65%. LV with grade I diastolic dysfunction  LDL 42 HgbA1c 5.3 VTE prophylaxis - Lovenox aspirin  81 mg daily prior to admission, now on aspirin  81 mg daily and clopidogrel 75 mg daily for 3 weeks and then Plavix alone. Therapy recommendations:  Pending Disposition:  pending   Hx of Stroke/TIA Prior TIA in May 2025  Hypertension Atrial tachycardia Home meds:  metoprolol  succinate 25mg  24 hr tablet, take 0.5 tablets (12.5mg  total) by mouth at bedtime Stable Blood Pressure Goal: BP less than 220/110   Hyperlipidemia Home meds:  simvastatin  20mg  tab 1 tab PO daily, holding due to  transaminitis LDL 42,< 70 High intensity statin not indicated currently below goal  Continue statin at discharge  Non-Diabetic: HgbA1c 5.3, goal < 7.0 CBGs SSI  Tobacco Abuse Former smoker  Dysphagia Patient has post-stroke dysphagia, SLP consulted    Diet   Diet Heart Room  service appropriate? Yes; Fluid consistency: Thin   Advance diet as tolerated  Other Stroke Risk Factors Coronary artery disease   Other Active Problems   Hospital day # 0   Karna Geralds DNP, ACNPC-AG  [facility]  I have personally obtained history,examined this patient, reviewed notes, independently viewed imaging studies, participated in medical decision making and plan of care.ROS completed by me personally and pertinent positives fully documented  I have made any additions or clarifications directly to the above note. Agree with note above.  Patient presented with right facial droop, slurred speech and right-sided weakness which appears to be improving.  MRI scan shows a left internal capsule lacunar infarct likely from small vessel disease.  Continue ongoing stroke workup.  Aggressive risk factor modification.  Recommend aspirin  Plavix for 3 weeks followed by Plavix alone.  Discussed with Dr. Caleen.   I personally spent a total of 50 minutes in the care of the patient today including getting/reviewing separately obtained history, performing a medically appropriate exam/evaluation, counseling and educating, placing orders, referring and communicating with other health care professionals, documenting clinical information in the EHR, independently interpreting results, and coordinating care.        Eather Popp, MD Medical Director [facility] Pager: [phone] 11/05/2023 5:13 PM    To contact Stroke Continuity provider, please refer to [email or web address]. After hours, contact General Neurology

## 2023-11-05 NOTE — Progress Notes (Signed)
 PROGRESS NOTE    Robert Pacheco  FMW:[id number] DOB: 1933-10-25 DOA: 11/04/2023 PCP: [name], DO    Brief Narrative:   89 year old with history of TIA, CAD status post CABG 2000, HTN, paroxysmal atrial tachycardia on Zio patch, HLD presented with facial droop and right-sided weakness.  Seen by neurology due to concerns of CVA.  CT head unremarkable.  CTA head and neck shows 50% left ICA stenosis with severe right vertebral artery stenosis.  Assessment & Plan:  Acute ischemic CVA Right-sided weakness Right-sided facial droop Dysphagia History of TIA -MRI brain is consistent with small acute CVA.  CTA head is negative, CTA head and neck shows 50% right ICA stenosis with severe right vertebral artery stenosis at the origin.  Continue neurochecks.  Routine workup including echocardiogram has been ordered.  LDL 42, A1c 5.3.  Antiplatelets per neurology team     Transaminitis - CT scan is showing concerns of liver cyst.  And hepatic mass on right upper quadrant ultrasound.  This can be followed up outpatient.  LFTs are trending downwards.  Acute hepatitis panel, EBV, CMV    History of CAD s/p CABG  2000 -At home patient is on aspirin  only.  In the setting of concern for acute CVA currently patient is on dual antiplatelet therapy.  Holding Toprol -XL in the setting of concern for acute CVA in order to maintain permissive hypertension.   Essential hypertension Atrial tachycardia -Holding Toprol -XL for maintain permissive hypertension for next 24 hours.   DVT prophylaxis: enoxaparin (LOVENOX) injection 40 mg Start: 11/05/23 1400 SCD's Start: 11/05/23 0203      Code Status: Full Code Family Communication:   Status is: Inpatient Remains inpatient appropriate because: Ongoing evaluation for acute CVA.   PT Follow up Recs:   Subjective: No complaints   Examination:  General exam: Appears calm and comfortable  Respiratory system: Clear to auscultation. Respiratory  effort normal. Cardiovascular system: S1 & S2 heard, RRR. No JVD, murmurs, rubs, gallops or clicks. No pedal edema. Gastrointestinal system: Abdomen is nondistended, soft and nontender. No organomegaly or masses felt. Normal bowel sounds heard. Central nervous system: Alert and oriented.  Right-sided facial droop. Extremities: Symmetric 5 x 5 power. Skin: No rashes, lesions or ulcers Psychiatry: Judgement and insight appear normal. Mood & affect appropriate.                Diet Orders (From admission, onward)     Start     Ordered   11/05/23 0212  Diet Heart Room service appropriate? Yes; Fluid consistency: Thin  Diet effective now       Question Answer Comment  Room service appropriate? Yes   Fluid consistency: Thin      11/05/23 0211            Objective: Vitals:   11/05/23 0445 11/05/23 0615 11/05/23 0745 11/05/23 0915  BP: 135/75 (!) 148/73  125/70  Pulse: 90 84 83 73  Resp: 19 (!) 22 (!) 25 20  Temp:   97.8 F (36.6 C)   TempSrc:   Oral   SpO2: 100% 100% 100% 99%  Weight:      Height:       No intake or output data in the 24 hours ending 11/05/23 1224 Filed Weights   11/04/23 1900 11/04/23 2021  Weight: 82.8 kg 82.8 kg    Scheduled Meds:  [START ON 11/06/2023]  stroke: early stages of recovery book   Does not apply Once   aspirin  EC  81 mg Oral Daily   clopidogrel  75 mg Oral Daily   enoxaparin (LOVENOX) injection  40 mg Subcutaneous Q24H   psyllium  1 packet Oral Daily   Continuous Infusions:  Nutritional status     Body mass index is 27.43 kg/m.  Data Reviewed:   CBC: Recent Labs  Lab 11/04/23 0942 11/04/23 1956 11/04/23 1957 11/05/23 0340  WBC 9.3  --  12.1* 10.0  NEUTROABS 8.2*  --  10.9*  --   HGB 14.4 13.9 13.9 13.4  HCT 43.2 41.0 41.3 40.0  MCV 95.2  --  92.8 94.6  PLT 199  --  192 183   Basic Metabolic Panel: Recent Labs  Lab 11/04/23 0942 11/04/23 1956 11/04/23 1957 11/05/23 0340  NA 140 140 139 138  K 3.8  3.4* 3.4* 3.6  CL 107 104 105 103  CO2 21*  --  22 24  GLUCOSE 149* 123* 121* 119*  BUN 15 16 15 14   CREATININE 0.98 0.90 1.00 1.05  CALCIUM 8.7*  --  8.7* 8.6*   GFR: Estimated Creatinine Clearance: 47.7 mL/min (by C-G formula based on SCr of 1.05 mg/dL). Liver Function Tests: Recent Labs  Lab 11/04/23 0942 11/04/23 1957 11/05/23 0340  AST 63* 289* 193*  ALT 34 243* 206*  ALKPHOS 79 119 110  BILITOT 1.2 2.8* 2.7*  PROT 6.2* 6.4* 5.9*  ALBUMIN 3.5 3.5 3.2*   Recent Labs  Lab 11/04/23 0942  LIPASE 52*   No results for input(s): AMMONIA in the last 168 hours. Coagulation Profile: Recent Labs  Lab 11/04/23 1957  INR 1.0   Cardiac Enzymes: No results for input(s): CKTOTAL, CKMB, CKMBINDEX, TROPONINI in the last 168 hours. BNP (last 3 results) No results for input(s): PROBNP in the last 8760 hours. HbA1C: Recent Labs    11/05/23 0340  HGBA1C 5.3   CBG: Recent Labs  Lab 11/04/23 1949  GLUCAP 127*   Lipid Profile: Recent Labs    11/05/23 0340  CHOL 91  HDL 39*  LDLCALC 42  TRIG 48  CHOLHDL 2.3   Thyroid Function Tests: No results for input(s): TSH, T4TOTAL, FREET4, T3FREE, THYROIDAB in the last 72 hours. Anemia Panel: No results for input(s): VITAMINB12, FOLATE, FERRITIN, TIBC, IRON, RETICCTPCT in the last 72 hours. Sepsis Labs: No results for input(s): PROCALCITON, LATICACIDVEN in the last 168 hours.  No results found for this or any previous visit (from the past 240 hours).       Radiology Studies: MR BRAIN WO CONTRAST Result Date: 11/05/2023 EXAM: MR Brain without Intravenous Contrast. CLINICAL HISTORY: Acute CVA. TECHNIQUE: Magnetic resonance images of the brain without intravenous contrast in multiple planes. CONTRAST: Without. COMPARISON: MRI head Jun 08, 2023 FINDINGS: BRAIN: Small acute perforator infarct in the left basal ganglia. Moderate T2/FLAIR hyperintensities in the white matter, compatible with  chronic microvascular ischemic change. No intracranial mass or hemorrhage. No midline shift or extra-axial fluid collection. No cerebellar tonsillar ectopia. The central arterial and venous flow voids are patent. VENTRICLES: No hydrocephalus. ORBITS: The orbits are normal. SINUSES AND MASTOIDS: The sinuses and mastoid air cells are clear. BONES: No acute fracture or focal osseous lesion. IMPRESSION: 1. Small acute perforator infarct in the left basal ganglia. Electronically signed by: Gilmore Molt MD 11/05/2023 03:15 AM EDT RP Workstation: [id number]   CT ABDOMEN PELVIS WO CONTRAST Result Date: 11/04/2023 CLINICAL DATA:  Acute abdominal pain EXAM: CT ABDOMEN AND PELVIS WITHOUT CONTRAST TECHNIQUE: Multidetector CT imaging of the abdomen and pelvis was performed  following the standard protocol without IV contrast. RADIATION DOSE REDUCTION: This exam was performed according to the departmental dose-optimization program which includes automated exposure control, adjustment of the mA and/or kV according to patient size and/or use of iterative reconstruction technique. COMPARISON:  06/08/2023 FINDINGS: Lower chest: No acute abnormality. Hepatobiliary: Liver again demonstrates multiple cysts similar to that seen on the prior exam. Gallbladder demonstrates a normal appearance. No definitive hepatic mass is seen. Pancreas: Unremarkable. No pancreatic ductal dilatation or surrounding inflammatory changes. Spleen: Normal in size without focal abnormality. Adrenals/Urinary Tract: Adrenal glands are within normal limits. 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 Evaluation for calculi is limited due to contrast enhancement from prior CT of head. No obstructive changes are seen. The bladder is well distended with opacified urine. Indentation upon the inferior aspect of the bladder related to the prostate is noted. Stomach/Bowel: No obstructive or inflammatory changes of colon are noted.  Mild fecal material is seen throughout the colon. The appendix is within normal limits. Small bowel and stomach are unremarkable. Vascular/Lymphatic: Aortic atherosclerosis. No enlarged abdominal or pelvic lymph nodes. Reproductive: Prostate is enlarged in size. Other: No abdominal wall hernia or abnormality. No abdominopelvic ascites. Musculoskeletal: No acute or significant osseous findings. IMPRESSION: Chronic changes as described above.  No acute abnormality is noted. Electronically Signed   By: Oneil Devonshire M.D.   On: 11/04/2023 23:52   US  Abdomen Limited RUQ (LIVER/GB) Result Date: 11/04/2023 CLINICAL DATA:  [id number] Transaminitis [id number] EXAM: ULTRASOUND ABDOMEN LIMITED RIGHT UPPER QUADRANT COMPARISON:  CT abdomen pelvis 11/04/2023 FINDINGS: Gallbladder: No gallstones or wall thickening visualized. No sonographic Murphy sign noted by sonographer. Common bile duct: Diameter: 3mm Liver: Multiple simple hepatic cysts. Increased parenchymal echogenicity. Portal vein is patent on color Doppler imaging with normal direction of blood flow towards the liver. Other: None. IMPRESSION: Hepatic steatosis. Please note limited evaluation for focal hepatic masses in a patient with hepatic steatosis due to decreased penetration of the acoustic ultrasound waves. Electronically Signed   By: Morgane  Naveau M.D.   On: 11/04/2023 23:21   CT ANGIO HEAD NECK W WO CM (CODE STROKE) Result Date: 11/04/2023 EXAM: CT HEAD WITHOUT CTA HEAD AND NECK WITH AND WITHOUT 11/04/2023 08:03:44 PM TECHNIQUE: CTA of the head and neck was performed with and without the administration of intravenous contrast. Noncontrast CT of the head with reconstructed 2-D images are also provided for review. Multiplanar 2D and/or 3D reformatted images are provided for review. Automated exposure control, iterative reconstruction, and/or weight based adjustment of the mA/kV was utilized to reduce the radiation dose to as low as reasonably achievable. COMPARISON:  None available CLINICAL HISTORY: Neuro deficit, acute, stroke suspected. Right arm drift; Facial droop. FINDINGS: AORTIC ARCH AND ARCH VESSELS: No dissection or arterial injury. No significant stenosis of the brachiocephalic or subclavian arteries. CERVICAL CAROTID ARTERIES: No dissection, arterial injury, or hemodynamically significant stenosis of the right carotid. Approximately 50% stenosis of the left ICA proximally due to atherosclerosis. CERVICAL VERTEBRAL ARTERIES: Severe right vertebral artery origin stenosis. Moderate stenosis of the distal right vertebral artery near its dural margin. LUNGS AND MEDIASTINUM: Unremarkable. SOFT TISSUES: No acute abnormality. BONES: No acute abnormality. ANTERIOR CIRCULATION: No significant stenosis of the internal carotid arteries. No significant stenosis of the anterior cerebral arteries. No significant stenosis of the middle cerebral arteries. No aneurysm. POSTERIOR CIRCULATION: No significant stenosis of the posterior cerebral arteries. No significant stenosis of the basilar artery. No significant stenosis of the vertebral  arteries. No aneurysm. OTHER: No dural venous sinus thrombosis on this non-dedicated study. IMPRESSION: 1. Approximately 50% stenosis of the left ICA proximally due to atherosclerosis. 2. Severe right vertebral artery origin stenosis and moderate stenosis of the distal right vertebral artery near its dural margin. Electronically signed by: Gilmore Molt MD 11/04/2023 08:26 PM EDT RP Workstation: [id number]   CT HEAD CODE STROKE WO CONTRAST Result Date: 11/04/2023 EXAM: CT HEAD WITHOUT CONTRAST 11/04/2023 08:02:33 PM TECHNIQUE: CT of the head was performed without the administration of intravenous contrast. Automated exposure control, iterative reconstruction, and/or weight based adjustment of the mA/kV was utilized to reduce the radiation dose to as low as reasonably achievable. COMPARISON: None available. CLINICAL HISTORY: Neuro deficit, acute,  stroke suspected. No contrast; CT HEAD CODE STROKE WO CONTRAST FINDINGS: BRAIN AND VENTRICLES: No acute hemorrhage. No evidence of acute infarct. No hydrocephalus. No extra-axial collection. No mass effect or midline shift. Patchy white matter hypodensities are compatible with chronic microvascular ischemic change. ORBITS: No acute abnormality. SINUSES: No acute abnormality. SOFT TISSUES AND SKULL: No acute soft tissue abnormality. No skull fracture. IMPRESSION: 1. No acute intracranial abnormality. Findings conveyed to Dr. Vanessa via telephone at 8:09 PM. Electronically signed by: Gilmore Molt MD 11/04/2023 08:10 PM EDT RP Workstation: [id number]           LOS: 0 days   Time spent= 35 mins    Burgess JAYSON Dare, MD [facility]  If 7PM-7AM, please contact night-coverage  11/05/2023, 12:24 PM

## 2023-11-05 NOTE — ED Provider Notes (Signed)
 I assumed care of this patient from previous provider.  Please see their note for further details of history, exam, and MDM.   Briefly patient is a 89 y.o. male who presented return for right-sided weakness as a code stroke.  Weakness resolved.  Seen by neurology who recommended admission for stroke workup.  Currently awaiting call from hospitalist.   Spoke with Dr. Sundil who will admit.      Trine Raynell Moder, MD 11/05/23 0200

## 2023-11-05 NOTE — ED Notes (Signed)
Pt just returned from CT.

## 2023-11-05 NOTE — ED Notes (Signed)
 Patient transported to CT

## 2023-11-05 NOTE — Hospital Course (Addendum)
 Brief Narrative:   89 year old with history of TIA, CAD status post CABG 2000, HTN, paroxysmal atrial tachycardia on Zio patch, HLD presented with facial droop and right-sided weakness.  Seen by neurology due to concerns of CVA.  CT head unremarkable.  CTA head and neck shows 50% left ICA stenosis with severe right vertebral artery stenosis.  Assessment & Plan:  Acute ischemic CVA Right-sided weakness Right-sided facial droop Dysphagia History of TIA -MRI brain is consistent with small acute CVA.  CTA head is negative, CTA head and neck shows 50% right ICA stenosis with severe right vertebral artery stenosis at the origin.  Continue neurochecks.  Routine workup including echocardiogram has been ordered.  LDL 42, A1c 5.3.  Antiplatelets per neurology team     Transaminitis - CT scan is showing concerns of liver cyst.  And hepatic mass on right upper quadrant ultrasound.  This can be followed up outpatient.  LFTs are trending downwards.  Acute hepatitis panel, EBV, CMV    History of CAD s/p CABG  2000 -At home patient is on aspirin  only.  In the setting of concern for acute CVA currently patient is on dual antiplatelet therapy.  Holding Toprol -XL in the setting of concern for acute CVA in order to maintain permissive hypertension.   Essential hypertension Atrial tachycardia -Holding Toprol -XL for maintain permissive hypertension for next 24 hours.   DVT prophylaxis: enoxaparin (LOVENOX) injection 40 mg Start: 11/05/23 1400 SCD's Start: 11/05/23 0203      Code Status: Full Code Family Communication:   Status is: Inpatient Remains inpatient appropriate because: Ongoing evaluation for acute CVA.   PT Follow up Recs:   Subjective: No complaints   Examination:  General exam: Appears calm and comfortable  Respiratory system: Clear to auscultation. Respiratory effort normal. Cardiovascular system: S1 & S2 heard, RRR. No JVD, murmurs, rubs, gallops or clicks. No pedal  edema. Gastrointestinal system: Abdomen is nondistended, soft and nontender. No organomegaly or masses felt. Normal bowel sounds heard. Central nervous system: Alert and oriented.  Right-sided facial droop. Extremities: Symmetric 5 x 5 power. Skin: No rashes, lesions or ulcers Psychiatry: Judgement and insight appear normal. Mood & affect appropriate.

## 2023-11-05 NOTE — Progress Notes (Signed)
 Patient starting to have fever with tachycardia. Tylenol  was given, and I will order BCx, resp viral panel. Low threshold to start Abx. No obvious source.

## 2023-11-05 NOTE — H&P (Addendum)
 History and Physical    Robert Pacheco FMW:[id number] DOB: [birth year] DOA: 11/04/2023  PCP: [name], DO   Patient coming from: Home   Chief Complaint:  Chief Complaint  Patient presents with   Code Stroke   ED TRIAGE note:PT BIB [facility] with a report of code stroke. At 1845, PT's family reportedly noticed dysphagia, right sided facial droop and right arm weakness. PT CBG was 126.   HPI:  Robert Pacheco is a 89 y.o. male with medical history significant of TIA, CAD s/p CABG 2000, essential hypertension, paroxysmal atrial tachycardia seen on Zio monitor and hyperlipidemia hyperlipidemia presented to emergency department as a code stroke with sudden onset of right-sided facial droop and right-sided weakness. Was seen in the emergency department earlier with abdominal pain and right-sided numbness and right-sided facial droop but during that time focal neurological symptoms resolved and he was discharged after abdominal pain resolved after having a bowel movement and referred to follow-up with outpatient neurology.  Chart review patient has history of TIA in May 2025.  In the ED patient has been evaluated by neurology recommended to monitor every 30-minute until 11:15 PM 10/8 as patient is still window for [facility] and recommended to activate code stroke if symptoms worsen. Patient is currently out of window for TNKase now.  Neurology recommendation following: - monitor in the ED Q10mins until outside tnkase window at 1115PM. Active code stroke if symptoms worsen. - Frequent Neuro checks per stroke unit protocol - Recommend brain imaging with MRI Brain without contrast - Recommend obtaining TTE  - Recommend obtaining Lipid panel with LDL - Please start statin if LDL > 70 - Recommend HbA1c to evaluate for diabetes and how well it is controlled. - Antithrombotic - aspirin  325mg  once, plavix 300mg  once, followed by Aspirin  81mg  daily along with plavix 75mg  daily x 21days, followed  by Aspirin  81mg  daily alone. - Recommend DVT ppx - SBP goal - permissive hypertension first 24 h < 220/110. Held home meds.  - Recommend Telemetry monitoring for arrythmia - Recommend bedside swallow screen prior to PO intake. - Stroke education booklet - Recommend PT/OT/SLP consult  At presentation to ED patient is hemodynamically stable. CMP showing low potassium 3.4 otherwise unremarkable.  CBC showing leukocytosis 12.1 otherwise unremarkable.  Normal pro time INR.  Normal blood alcohol level.   CT head no acute intracranial abnormality.  CT angio head and neck IMPRESSION: 1. Approximately 50% stenosis of the left ICA proximally due to atherosclerosis. 2. Severe right vertebral artery origin stenosis and moderate stenosis of the distal right vertebral artery near its dural margin.   In the ED patient received aspirin  325 mg and Plavix 300 mg.  While in the ED patient's right-sided facial droop, slurred speech has been improved.  Hospitalist has been consulted for further evaluation management of concern for acute CVA-in the setting of right-sided weakness and facial droop.  Significant labs in the ED: Lab Orders         Ethanol         Protime-INR         APTT         CBC         Differential         Comprehensive metabolic panel         Urine rapid drug screen (hosp performed)         Hemoglobin A1c         Lipid panel  CBC         Comprehensive metabolic panel         Hepatitis panel, acute         I-stat chem 8, ED         CBG monitoring, ED       Review of Systems:  Review of Systems  Constitutional:  Negative for chills, fever and weight loss.  Respiratory:  Negative for cough.   Cardiovascular:  Negative for chest pain and palpitations.  Gastrointestinal:  Negative for abdominal pain, heartburn, nausea and vomiting.  Musculoskeletal:  Negative for myalgias.  Neurological:  Negative for dizziness, sensory change, speech change, focal weakness, loss  of consciousness, weakness and headaches.    Past Medical History:  Diagnosis Date   Allergy    Coronary artery disease    Hyperlipidemia    Hypertension    Personal history of colonic adenomas 05/10/2012   Skin cancer, basal cell     Past Surgical History:  Procedure Laterality Date   CATARACT EXTRACTION     COLONOSCOPY  multiple   CORONARY ARTERY BYPASS GRAFT  2000   EYE SURGERY     HERNIA REPAIR     MOHS SURGERY       reports that he has quit smoking. He has never used smokeless tobacco. He reports that he does not drink alcohol and does not use drugs.  Allergies  Allergen Reactions   Other Nausea Only    UNKNOWN PAIN MED    Family History  Problem Relation Age of Onset   Heart disease Father    Colon cancer Brother 37   Healthy Child        x 4   Healthy Grandchild        x 11   Colon cancer Sister 67   Prostate cancer Brother     Prior to Admission medications   Medication Sig Start Date End Date Taking? Authorizing Provider  acetaminophen  (TYLENOL ) 500 MG tablet Take 500 mg by mouth daily as needed for mild pain (pain score 1-3).   Yes [provider]  aspirin  81 MG chewable tablet Chew 1 tablet (81 mg total) by mouth daily. 09/10/20  Yes Tobb, Kardie, DO  Loratadine (CLARITIN PO) Take 1 tablet by mouth as needed.   Yes [provider]  meloxicam  (MOBIC ) 7.5 MG tablet Take 1 tablet (7.5 mg total) by mouth daily. 09/21/23  Yes [name], DO  metoprolol  succinate (TOPROL -XL) 25 MG 24 hr tablet Take 0.5 tablets (12.5 mg total) by mouth at bedtime. 06/12/23  Yes Wendling, [name], DO  Omega-3 Fatty Acids (FISH OIL) 1000 MG CAPS Take 1 capsule by mouth in the morning.   Yes [provider]  polyethylene glycol (MIRALAX / GLYCOLAX) 17 g packet Take 17 g by mouth daily as needed for mild constipation.   Yes [provider]  psyllium (HYDROCIL/METAMUCIL) 95 % PACK Take 1 packet by mouth daily.   Yes [provider]  simvastatin  (ZOCOR ) 20 MG tablet Take 1 tablet (20 mg total) by mouth daily at 6 PM. 08/11/23  Yes Wendling, [name], DO  NITROSTAT  0.4 MG SL tablet DISSOLVE ONE TABLET UNDER THE TONGUE EVERY 5 MINUTES AS NEEDED. 09/21/23   [name], DO     Physical Exam: Vitals:   11/04/23 2315 11/04/23 2330 11/04/23 2345 11/05/23 0000  BP: 117/60 121/65 (!) 133/52 109/66  Pulse: 84 84 73 74  Resp: 11 (!) 22 18 17  SpO2: 100% 100% 100% 100%  Weight:      Height:        Physical Exam Vitals and nursing note reviewed.  Constitutional:      Appearance: Normal appearance. He is not ill-appearing.  HENT:     Nose: Nose normal.     Mouth/Throat:     Mouth: Mucous membranes are moist.  Eyes:     Pupils: Pupils are equal, round, and reactive to light.  Cardiovascular:     Rate and Rhythm: Normal rate and regular rhythm.     Pulses: Normal pulses.  Pulmonary:     Effort: Pulmonary effort is normal.     Breath sounds: Normal breath sounds.  Abdominal:     Palpations: Abdomen is soft.  Musculoskeletal:     Cervical back: Neck supple.     Right lower leg: No edema.     Left lower leg: No edema.  Skin:    Capillary Refill: Capillary refill takes less than 2 seconds.  Neurological:     Mental Status: He is alert and oriented to person, place, and time.     Sensory: No sensory deficit.     Motor: No weakness.  Psychiatric:        Mood and Affect: Mood normal.      Labs on Admission: I have personally reviewed following labs and imaging studies  CBC: Recent Labs  Lab 11/04/23 0942 11/04/23 1956 11/04/23 1957  WBC 9.3  --  12.1*  NEUTROABS 8.2*  --  10.9*  HGB 14.4 13.9 13.9  HCT 43.2 41.0 41.3  MCV 95.2  --  92.8  PLT 199  --  192   Basic Metabolic Panel: Recent Labs  Lab 11/04/23 0942 11/04/23 1956 11/04/23 1957  NA 140 140 139  K 3.8 3.4* 3.4*  CL 107 104 105  CO2 21*  --  22  GLUCOSE 149* 123* 121*  BUN 15 16 15   CREATININE 0.98 0.90  1.00  CALCIUM 8.7*  --  8.7*   GFR: Estimated Creatinine Clearance: 50.1 mL/min (by C-G formula based on SCr of 1 mg/dL). Liver Function Tests: Recent Labs  Lab 11/04/23 0942 11/04/23 1957  AST 63* 289*  ALT 34 243*  ALKPHOS 79 119  BILITOT 1.2 2.8*  PROT 6.2* 6.4*  ALBUMIN 3.5 3.5   Recent Labs  Lab 11/04/23 0942  LIPASE 52*   No results for input(s): AMMONIA in the last 168 hours. Coagulation Profile: Recent Labs  Lab 11/04/23 1957  INR 1.0   Cardiac Enzymes: No results for input(s): CKTOTAL, CKMB, CKMBINDEX, TROPONINI, TROPONINIHS in the last 168 hours. BNP (last 3 results) No results for input(s): BNP in the last 8760 hours. HbA1C: No results for input(s): HGBA1C in the last 72 hours. CBG: Recent Labs  Lab 11/04/23 1949  GLUCAP 127*   Lipid Profile: No results for input(s): CHOL, HDL, LDLCALC, TRIG, CHOLHDL, LDLDIRECT in the last 72 hours. Thyroid Function Tests: No results for input(s): TSH, T4TOTAL, FREET4, T3FREE, THYROIDAB in the last 72 hours. Anemia Panel: No results for input(s): VITAMINB12, FOLATE, FERRITIN, TIBC, IRON, RETICCTPCT in the last 72 hours. Urine analysis:    Component Value Date/Time   COLORURINE AMBER (A) 11/04/2023 0924   APPEARANCEUR HAZY (A) 11/04/2023 0924   LABSPEC 1.019 11/04/2023 0924   PHURINE 5.0 11/04/2023 0924   GLUCOSEU NEGATIVE 11/04/2023 0924   GLUCOSEU NEGATIVE 02/18/2023 1023   HGBUR NEGATIVE 11/04/2023 0924   HGBUR negative 03/27/2009 0000   BILIRUBINUR NEGATIVE  11/04/2023 0924   BILIRUBINUR n 07/28/2013 1251   KETONESUR NEGATIVE 11/04/2023 0924   PROTEINUR 100 (A) 11/04/2023 0924   UROBILINOGEN 1.0 02/18/2023 1023   NITRITE NEGATIVE 11/04/2023 0924   LEUKOCYTESUR NEGATIVE 11/04/2023 0924    Radiological Exams on Admission: I have personally reviewed images MR BRAIN WO CONTRAST Result Date: 11/05/2023 EXAM: MR Brain without Intravenous Contrast. CLINICAL  HISTORY: Acute CVA. TECHNIQUE: Magnetic resonance images of the brain without intravenous contrast in multiple planes. CONTRAST: Without. COMPARISON: MRI head Jun 08, 2023 FINDINGS: BRAIN: Small acute perforator infarct in the left basal ganglia. Moderate T2/FLAIR hyperintensities in the white matter, compatible with chronic microvascular ischemic change. No intracranial mass or hemorrhage. No midline shift or extra-axial fluid collection. No cerebellar tonsillar ectopia. The central arterial and venous flow voids are patent. VENTRICLES: No hydrocephalus. ORBITS: The orbits are normal. SINUSES AND MASTOIDS: The sinuses and mastoid air cells are clear. BONES: No acute fracture or focal osseous lesion. IMPRESSION: 1. Small acute perforator infarct in the left basal ganglia. Electronically signed by: Gilmore Molt MD 11/05/2023 03:15 AM EDT RP Workstation: [id number]   CT ABDOMEN PELVIS WO CONTRAST Result Date: 11/04/2023 CLINICAL DATA:  Acute abdominal pain EXAM: CT ABDOMEN AND PELVIS WITHOUT CONTRAST TECHNIQUE: Multidetector CT imaging of the abdomen and pelvis was performed following the standard protocol without IV contrast. RADIATION DOSE REDUCTION: This exam was performed according to the departmental dose-optimization program which includes automated exposure control, adjustment of the mA and/or kV according to patient size and/or use of iterative reconstruction technique. COMPARISON:  06/08/2023 FINDINGS: Lower chest: No acute abnormality. Hepatobiliary: Liver again demonstrates multiple cysts similar to that seen on the prior exam. Gallbladder demonstrates a normal appearance. No definitive hepatic mass is seen. Pancreas: Unremarkable. No pancreatic ductal dilatation or surrounding inflammatory changes. Spleen: Normal in size without focal abnormality. Adrenals/Urinary Tract: Adrenal glands are within normal limits. 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 Evaluation  for calculi is limited due to contrast enhancement from prior CT of head. No obstructive changes are seen. The bladder is well distended with opacified urine. Indentation upon the inferior aspect of the bladder related to the prostate is noted. Stomach/Bowel: No obstructive or inflammatory changes of colon are noted. Mild fecal material is seen throughout the colon. The appendix is within normal limits. Small bowel and stomach are unremarkable. Vascular/Lymphatic: Aortic atherosclerosis. No enlarged abdominal or pelvic lymph nodes. Reproductive: Prostate is enlarged in size. Other: No abdominal wall hernia or abnormality. No abdominopelvic ascites. Musculoskeletal: No acute or significant osseous findings. IMPRESSION: Chronic changes as described above.  No acute abnormality is noted. Electronically Signed   By: Oneil Devonshire M.D.   On: 11/04/2023 23:52   US  Abdomen Limited RUQ (LIVER/GB) Result Date: 11/04/2023 CLINICAL DATA:  [id number] Transaminitis [id number] EXAM: ULTRASOUND ABDOMEN LIMITED RIGHT UPPER QUADRANT COMPARISON:  CT abdomen pelvis 11/04/2023 FINDINGS: Gallbladder: No gallstones or wall thickening visualized. No sonographic Murphy sign noted by sonographer. Common bile duct: Diameter: 3mm Liver: Multiple simple hepatic cysts. Increased parenchymal echogenicity. Portal vein is patent on color Doppler imaging with normal direction of blood flow towards the liver. Other: None. IMPRESSION: Hepatic steatosis. Please note limited evaluation for focal hepatic masses in a patient with hepatic steatosis due to decreased penetration of the acoustic ultrasound waves. Electronically Signed   By: Morgane  Naveau M.D.   On: 11/04/2023 23:21   CT ANGIO HEAD NECK W WO CM (CODE STROKE) Result Date: 11/04/2023 EXAM: CT HEAD  WITHOUT CTA HEAD AND NECK WITH AND WITHOUT 11/04/2023 08:03:44 PM TECHNIQUE: CTA of the head and neck was performed with and without the administration of intravenous contrast. Noncontrast CT of the  head with reconstructed 2-D images are also provided for review. Multiplanar 2D and/or 3D reformatted images are provided for review. Automated exposure control, iterative reconstruction, and/or weight based adjustment of the mA/kV was utilized to reduce the radiation dose to as low as reasonably achievable. COMPARISON: None available CLINICAL HISTORY: Neuro deficit, acute, stroke suspected. Right arm drift; Facial droop. FINDINGS: AORTIC ARCH AND ARCH VESSELS: No dissection or arterial injury. No significant stenosis of the brachiocephalic or subclavian arteries. CERVICAL CAROTID ARTERIES: No dissection, arterial injury, or hemodynamically significant stenosis of the right carotid. Approximately 50% stenosis of the left ICA proximally due to atherosclerosis. CERVICAL VERTEBRAL ARTERIES: Severe right vertebral artery origin stenosis. Moderate stenosis of the distal right vertebral artery near its dural margin. LUNGS AND MEDIASTINUM: Unremarkable. SOFT TISSUES: No acute abnormality. BONES: No acute abnormality. ANTERIOR CIRCULATION: No significant stenosis of the internal carotid arteries. No significant stenosis of the anterior cerebral arteries. No significant stenosis of the middle cerebral arteries. No aneurysm. POSTERIOR CIRCULATION: No significant stenosis of the posterior cerebral arteries. No significant stenosis of the basilar artery. No significant stenosis of the vertebral arteries. No aneurysm. OTHER: No dural venous sinus thrombosis on this non-dedicated study. IMPRESSION: 1. Approximately 50% stenosis of the left ICA proximally due to atherosclerosis. 2. Severe right vertebral artery origin stenosis and moderate stenosis of the distal right vertebral artery near its dural margin. Electronically signed by: Gilmore Molt MD 11/04/2023 08:26 PM EDT RP Workstation: [id number]   CT HEAD CODE STROKE WO CONTRAST Result Date: 11/04/2023 EXAM: CT HEAD WITHOUT CONTRAST 11/04/2023 08:02:33 PM TECHNIQUE: CT of  the head was performed without the administration of intravenous contrast. Automated exposure control, iterative reconstruction, and/or weight based adjustment of the mA/kV was utilized to reduce the radiation dose to as low as reasonably achievable. COMPARISON: None available. CLINICAL HISTORY: Neuro deficit, acute, stroke suspected. No contrast; CT HEAD CODE STROKE WO CONTRAST FINDINGS: BRAIN AND VENTRICLES: No acute hemorrhage. No evidence of acute infarct. No hydrocephalus. No extra-axial collection. No mass effect or midline shift. Patchy white matter hypodensities are compatible with chronic microvascular ischemic change. ORBITS: No acute abnormality. SINUSES: No acute abnormality. SOFT TISSUES AND SKULL: No acute soft tissue abnormality. No skull fracture. IMPRESSION: 1. No acute intracranial abnormality. Findings conveyed to Dr. Vanessa via telephone at 8:09 PM. Electronically signed by: Gilmore Molt MD 11/04/2023 08:10 PM EDT RP Workstation: [id number]     EKG: My personal interpretation of EKG shows: Normal sinus rhythm heart rate 89    Assessment/Plan: Principal Problem:   Right sided weakness Active Problems:   Hyperlipidemia   Essential hypertension   History of TIA (transient ischemic attack)   Facial droop   History of atrial tachycardia   History of CAD (coronary artery disease)   Acute CVA (cerebrovascular accident) (HCC)    Assessment and Plan: Acute ischemic CVA Right-sided weakness Right-sided facial droop Dysphagia History of TIA -Patient presented emergency department complaining of right-sided weakness, right-sided facial droop and dysphagia companied by family.  All this symptom has been improving since patient in the ED.  Initially patient has been seen in the emergency department for abdominal pain and workup workup was unremarkable.  Hepatic ultrasound showed steatosis. -At presentation to ED patient is hemodynamically stable - CT head no acute  intracranial abnormality - CT angio  head and neck showing right ICA stenosis 50%, severe right vertebral artery stenosis and moderate stenosis of the right vertebral artery near its dural margin. -Given patient has previous history of TIA and complaining about right-sided weakness with facial droop and dysphagia in the ED patient being evaluated by neurology. - Per neurology recommendation in the ED patient received aspirin  and Plavix load. - Obtaining MRI of the brain. - Obtain echocardiogram.  Continue cardiac monitor for development any arrhythmia. - Continue aspirin  and Plavix for 2 weeks followed 21 days followed by continue aspirin  alone based on neurology recommendation - Continue neurocheck every 4 hours. -Patient passed a stroke swallow screen continue diet. - Checking lipid panel and A1c. -Holding Crestor in the setting of significant transaminitis. - Continue permissive hypertension for next 24 hours for blood pressure below 210/120. -Consulted PT and OT. Addendum - MRI showing acute small perforator infraction of the left basal ganglia.   Transaminitis - Elevated AST/ALT 289, 249 and bilirubin level 2.8.  Normal ALP. - Hepatic ultrasound showing transhepatic steatosis. -CT abdomen pelvis showing liver with multiple cyst.  Normal gallbladder appearance.  No hepatic mass. -In the setting of significant transaminitis consulting GI for further recommendation. -Holding Crestor in the setting of transaminitis.   -Checking hepatitis panel.   History of CAD s/p CABG  2000 -At home patient is on aspirin  only.  In the setting of concern for acute CVA currently patient is on dual antiplatelet therapy.  Holding Toprol -XL in the setting of concern for acute CVA in order to maintain permissive hypertension.  Essential hypertension Atrial tachycardia -Holding Toprol -XL for maintain permissive hypertension for next 24 hours.   DVT prophylaxis:  Lovenox Code Status:  Full Code Diet:  Heart healthy diet Family Communication:   Family was present at bedside, at the time of interview. Opportunity was given to ask question and all questions were answered satisfactorily.  Disposition Plan: Pending MRI of the brain result.  And echocardiogram Consults: Neurology Admission status:   Inpatient, Telemetry bed  Severity of Illness: The appropriate patient status for this patient is INPATIENT. Inpatient status is judged to be reasonable and necessary in order to provide the required intensity of service to ensure the patient's safety. The patient's presenting symptoms, physical exam findings, and initial radiographic and laboratory data in the context of their chronic comorbidities is felt to place them at high risk for further clinical deterioration. Furthermore, it is not anticipated that the patient will be medically stable for discharge from the hospital within 2 midnights of admission.   * I certify that at the point of admission it is my clinical judgment that the patient will require inpatient hospital care spanning beyond 2 midnights from the point of admission due to high intensity of service, high risk for further deterioration and high frequency of surveillance required.DEWAINE    Takeesha Isley, MD [facility]  How to contact the [facility] Attending or Consulting provider 7A - 7P or covering provider during after hours 7P -7A, for this patient.  Check the care team in [facility] and look for a) attending/consulting [facility] provider listed and b) the [facility] team listed Log into [email or web address] and use [facility]'s universal password to access. If you do not have the password, please contact the hospital operator. Locate the [facility] provider you are looking for under [facility] and page to a number that you can be directly reached. If you still have difficulty reaching the provider, please page the [facility] (Director on Call) for the  Hospitalists listed on amion for assistance.  11/05/2023, 3:38  AM

## 2023-11-06 DIAGNOSIS — R748 Abnormal levels of other serum enzymes: Secondary | ICD-10-CM

## 2023-11-06 DIAGNOSIS — R7401 Elevation of levels of liver transaminase levels: Secondary | ICD-10-CM | POA: Diagnosis not present

## 2023-11-06 DIAGNOSIS — I639 Cerebral infarction, unspecified: Secondary | ICD-10-CM

## 2023-11-06 DIAGNOSIS — Z8679 Personal history of other diseases of the circulatory system: Secondary | ICD-10-CM | POA: Diagnosis not present

## 2023-11-06 DIAGNOSIS — I10 Essential (primary) hypertension: Secondary | ICD-10-CM | POA: Diagnosis not present

## 2023-11-06 LAB — RESPIRATORY PANEL BY PCR

## 2023-11-06 LAB — CBC
HCT: 39.1 % (ref 39.0–52.0)
Hemoglobin: 12.9 g/dL — ABNORMAL LOW (ref 13.0–17.0)
MCH: 31.5 pg (ref 26.0–34.0)
MCHC: 33 g/dL (ref 30.0–36.0)
MCV: 95.4 fL (ref 80.0–100.0)
Platelets: 145 K/uL — ABNORMAL LOW (ref 150–400)
RBC: 4.1 MIL/uL — ABNORMAL LOW (ref 4.22–5.81)
RDW: 12.6 % (ref 11.5–15.5)
WBC: 6 K/uL (ref 4.0–10.5)
nRBC: 0 % (ref 0.0–0.2)

## 2023-11-06 LAB — BASIC METABOLIC PANEL WITH GFR
Anion gap: 11 (ref 5–15)
BUN: 11 mg/dL (ref 8–23)
CO2: 19 mmol/L — ABNORMAL LOW (ref 22–32)
Calcium: 8.3 mg/dL — ABNORMAL LOW (ref 8.9–10.3)
Chloride: 105 mmol/L (ref 98–111)
Creatinine, Ser: 0.86 mg/dL (ref 0.61–1.24)
GFR, Estimated: >60 mL/min (ref >60)
Glucose, Bld: 91 mg/dL (ref 70–99)
Potassium: 3.8 mmol/L (ref 3.5–5.1)
Sodium: 135 mmol/L (ref 135–145)

## 2023-11-06 LAB — CYTOMEGALOVIRUS DNA, QUANTITATIVE REAL-TIME PCR, PLASMA
CMV DNA Quant: NEGATIVE [IU]/mL
Log10 CMV Qn DNA Pl: UNDETERMINED {Log_IU}/mL

## 2023-11-06 LAB — MAGNESIUM: Magnesium: 1.9 mg/dL (ref 1.7–2.4)

## 2023-11-06 LAB — EPSTEIN BARR VRS(EBV DNA BY PCR): EBV DNA QN by PCR: NEGATIVE [IU]/mL

## 2023-11-06 LAB — HEPATIC FUNCTION PANEL
ALT: 107 U/L — ABNORMAL HIGH (ref 0–44)
AST: 61 U/L — ABNORMAL HIGH (ref 15–41)
Albumin: 3 g/dL — ABNORMAL LOW (ref 3.5–5.0)
Alkaline Phosphatase: 93 U/L (ref 38–126)
Bilirubin, Direct: 0.4 mg/dL — ABNORMAL HIGH (ref 0.0–0.2)
Indirect Bilirubin: 1 mg/dL — ABNORMAL HIGH (ref 0.3–0.9)
Total Bilirubin: 1.4 mg/dL — ABNORMAL HIGH (ref 0.0–1.2)
Total Protein: 5.8 g/dL — ABNORMAL LOW (ref 6.5–8.1)

## 2023-11-06 LAB — PHOSPHORUS: Phosphorus: 1.8 mg/dL — ABNORMAL LOW (ref 2.5–4.6)

## 2023-11-06 MED ORDER — POTASSIUM PHOSPHATES 15 MMOLE/5ML IV SOLN
30.0000 mmol | Freq: Once | INTRAVENOUS | Status: AC
  Administered 2023-11-06: 30 mmol via INTRAVENOUS
  Filled 2023-11-06: qty 10

## 2023-11-06 NOTE — Evaluation (Signed)
 Occupational Therapy Evaluation Patient Details Name: Robert Pacheco MRN: [id number] DOB: 26-Jan-1934 Today's Date: 11/06/2023   History of Present Illness   89 year old male presented to [facility] on 10/8 with facial droop and right-sided weakness. + Acute ischemic CVA. PMH: TIA, CAD status post CABG 2000, HTN, paroxysmal atrial tachycardia on Zio patch, HLD.     Clinical Impressions At baseline, pt is Ind with ADLs, IADLs, and functional mobility without an AD, and drives. Pt now presents near baseline PLOF but with decreased activity tolerance, mildly decreased fine motor coordination in R UE (still WFL), mildly decreased proprioception in R side, decreased balance, mildly decreased cognition, and decreased safety and independence with functional tasks. Pt's B UE strength and ROM symmetrical and vision screen WFL. Pt currently demonstrates ability to complete ADLs largely Ind to Contact guard assist for safety and functional mobility/transfers with a RW with close Supervision to Contact guard assist for safety with no physical assist needed. Pt VSS on RA throughout session. Pt participated well in session, is motivated to return to PLOF, and has good family support. Pt will benefit from acute skilled OT services to address deficits and increase safety and independence with functional tasks. OT recommends use of a shower chair in the home for energy conservation. As pt is near baseline PLOF, no post-acute skilled OT needs are anticipated.      If plan is discharge home, recommend the following:   A little help with walking and/or transfers;A little help with bathing/dressing/bathroom;Assistance with cooking/housework;Assist for transportation;Help with stairs or ramp for entrance     Functional Status Assessment   Patient has had a recent decline in their functional status and demonstrates the ability to make significant improvements in function in a reasonable and predictable amount of time.      Equipment Recommendations   Tub/shower seat     Recommendations for Other Services         Precautions/Restrictions   Precautions Precautions: Fall Restrictions Weight Bearing Restrictions Per Provider Order: No     Mobility Bed Mobility Overal bed mobility: Needs Assistance Bed Mobility: Rolling, Sidelying to Sit, Sit to Supine Rolling: Modified independent (Device/Increase time) Sidelying to sit: Supervision   Sit to supine: Supervision   General bed mobility comments: Supervision to rise and return to supine Effortful to lift LEs onto stretcher but no physical assist required.    Transfers Overall transfer level: Needs assistance Equipment used: None Transfers: Sit to/from Stand, Bed to chair/wheelchair/BSC Sit to Stand: Contact guard assist, Supervision     Step pivot transfers: Supervision, Contact guard assist     General transfer comment: Close Supervision to CGA for safety with no physical assist needed      Balance Overall balance assessment: Needs assistance Sitting-balance support: Feet supported, No upper extremity supported Sitting balance-Leahy Scale: Good     Standing balance support: No upper extremity supported, During functional activity Standing balance-Leahy Scale: Fair                             ADL either performed or assessed with clinical judgement   ADL Overall ADL's : Needs assistance/impaired Eating/Feeding: Modified independent;Sitting Eating/Feeding Details (indicate cue type and reason): mildly increased time Grooming: Supervision/safety;Standing Grooming Details (indicate cue type and reason): mildly increased time; Supervision/CGA in standing for safety Upper Body Bathing: Set up;Supervision/ safety;Sitting   Lower Body Bathing: Supervison/ safety;Contact guard assist;Sit to/from stand   Upper Body Dressing : Contact guard  assist;Sitting Upper Body Dressing Details (indicate cue type and reason):  assist for managing lines Lower Body Dressing: Supervision/safety;Contact guard assist;Sit to/from stand   Toilet Transfer: Supervision/safety;Contact guard assist;Rolling walker (2 wheels);Ambulation;Regular Toilet   Toileting- Architect and Hygiene: Supervision/safety;Sit to/from stand       Functional mobility during ADLs: Contact guard assist;Rolling walker (2 wheels) General ADL Comments: Pt requiring Supervision to Contact guard assist for safety with no physical assist needed. Pt with decreased activity tolerance, fatiguing quickly during tasks. OT initiated education related to energy conservation, including recommendation of use of shower chair for energy conservation with pt and daughters verbalizing understanding; pt will benefit from addition information.     Vision Baseline Vision/History: 1 Wears glasses (for reading and driving at night) Ability to See in Adequate Light: 0 Adequate Patient Visual Report: No change from baseline Vision Assessment?: Yes;No apparent visual deficits Eye Alignment: Within Functional Limits Ocular Range of Motion: Within Functional Limits Alignment/Gaze Preference: Within Defined Limits Tracking/Visual Pursuits: Able to track stimulus in all quads without difficulty Saccades: Within functional limits Convergence: Within functional limits Visual Fields: No apparent deficits Diplopia Assessment:  (pt reports no diplopia) Depth Perception:  [facility])     Perception         Praxis         Pertinent Vitals/Pain Pain Assessment Pain Assessment: No/denies pain     Extremity/Trunk Assessment Upper Extremity Assessment Upper Extremity Assessment: Right hand dominant;Overall WFL for tasks assessed;RUE deficits/detail RUE Deficits / Details: strength, ROM, coordination, and sensation WFL; however, pt requiring mildly increased time for fine motor tasks and with mild noted R side proprioception deficts during ADLs but still WFL RUE  Sensation: decreased proprioception (mild) RUE Coordination: decreased fine motor (mild)   Lower Extremity Assessment Lower Extremity Assessment: Defer to PT evaluation       Communication Communication Communication: Impaired Factors Affecting Communication: Hearing impaired   Cognition Arousal: Alert Behavior During Therapy: WFL for tasks assessed/performed Cognition: Cognition impaired     Awareness: Intellectual awareness intact, Online awareness intact (mildly decreased insight into deficits, particularly of mildly decreased fine motor coordination and proprioception on the Right side) Memory impairment (select all impairments): 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 Pt cognition largely [facility] for tasks assessed but wth pt requiring mildly increased time for processing. Cognition not formally screened or evaluated                 Following commands: Intact       Cueing  General Comments   Cueing Techniques: Verbal cues  VSS on RA. Daughters present and supportive throughout session. RN entering room at end of session.   Exercises Exercises: Hand exercises Hand Exercises Opposition: AROM, Seated, Right, 5 reps (increased coordination)   Shoulder Instructions      Home Living Family/patient expects to be discharged to:: Private residence Living Arrangements: Alone Available Help at Discharge: Family;Available 24 hours/day (daughters can help but not physically) Type of Home: House Home Access: Stairs to enter [employer] of Steps: 2 Entrance Stairs-Rails: None Home Layout: One level     Bathroom Shower/Tub: Producer, television/film/video: Standard Bathroom Accessibility: No   Home  Equipment: Grab bars - tub/shower          Prior Functioning/Environment Prior Level of Function :  Independent/Modified Independent;Driving;History of Falls (last six months)             Mobility Comments: ind, without an AD. One fall in July ADLs Comments: ind with ADLs and IADLs; drives; likes to work on cars    OT Problem List: Decreased activity tolerance;Impaired balance (sitting and/or standing);Decreased coordination;Decreased cognition;Decreased safety awareness;Decreased knowledge of use of DME or AE   OT Treatment/Interventions: Self-care/ADL training;Therapeutic exercise;Energy conservation;DME and/or AE instruction;Therapeutic activities;Cognitive remediation/compensation;Patient/family education;Balance training      OT Goals(Current goals can be found in the care plan section)   Acute Rehab OT Goals Patient Stated Goal: to return home and remain independent OT Goal Formulation: With patient/family Time For Goal Achievement: 11/20/23 Potential to Achieve Goals: Good ADL Goals Pt Will Perform Grooming: with modified independence;standing Pt Will Perform Lower Body Bathing: with modified independence;sit to/from stand Pt Will Perform Lower Body Dressing: with modified independence;sit to/from stand Pt Will Transfer to Toilet: with modified independence;ambulating;regular height toilet (with least restrictive AD) Additional ADL Goal #1: Patient will demonstrate ability to participate in home exercise program for increased R UE fine motor coordination with Mod I with written HEP provided. Additional ADL Goal #2: Patient will demonstrate understanding of education in energy conservation strategies through teach back with handout provided.   OT Frequency:  Min 2X/week    Co-evaluation              AM-PAC OT 6 Clicks Daily Activity     Outcome Measure Help from another person eating meals?: None Help from another person taking care of personal grooming?: A  Little Help from another person toileting, which includes using toliet, bedpan, or urinal?: A Little Help from another person bathing (including washing, rinsing, drying)?: A Little Help from another person to put on and taking off regular upper body clothing?: A Little Help from another person to put on and taking off regular lower body clothing?: A Little 6 Click Score: 19   End of Session Equipment Utilized During Treatment: Gait belt;Rolling walker (2 wheels) Nurse Communication: Mobility status  Activity Tolerance: Patient tolerated treatment well Patient left: in bed;with call bell/phone within reach;with family/visitor present;with nursing/sitter in room  OT Visit Diagnosis: Unsteadiness on feet (R26.81);Ataxia, unspecified (R27.0);Other symptoms and signs involving cognitive function                Time: 1251-1320 OT Time Calculation (min): 29 min Charges:  OT General Charges $OT Visit: 1 Visit OT Evaluation $OT Eval Low Complexity: 1 Low OT Treatments $Self Care/Home Management : 8-22 mins  Margarie Rockey HERO., OTR/L, MA Acute Rehab [phone]   Margarie FORBES Horns 11/06/2023, 1:46 PM

## 2023-11-06 NOTE — Progress Notes (Signed)
 Progress Note   Subjective  Patient has had some chills overnight but no fevers. Liver enzymes continue to downtrend. He has no pain.    Objective   Vital signs in last 24 hours: Temp:  [98 F (36.7 C)-101.9 F (38.8 C)] 98.8 F (37.1 C) (10/10 0430) Pulse Rate:  [73-121] 90 (10/10 0530) Resp:  [17-26] 17 (10/10 0530) BP: (102-180)/(61-123) 131/61 (10/10 0530) SpO2:  [91 %-100 %] 92 % (10/10 0530) Last BM Date : 11/04/23 General:    white male in NAD Psych:  Cooperative. Normal mood and affect.  Intake/Output from previous day: No intake/output data recorded. Intake/Output this shift: No intake/output data recorded.  Lab Results: Recent Labs    11/04/23 1957 11/05/23 0340 11/06/23 0302  WBC 12.1* 10.0 6.0  HGB 13.9 13.4 12.9*  HCT 41.3 40.0 39.1  PLT 192 183 145*   BMET Recent Labs    11/04/23 1957 11/05/23 0340 11/06/23 0803  NA 139 138 135  K 3.4* 3.6 3.8  CL 105 103 105  CO2 22 24 19*  GLUCOSE 121* 119* 91  BUN 15 14 11   CREATININE 1.00 1.05 0.86  CALCIUM 8.7* 8.6* 8.3*   LFT Recent Labs    11/06/23 0803  PROT 5.8*  ALBUMIN 3.0*  AST 61*  ALT 107*  ALKPHOS 93  BILITOT 1.4*  BILIDIR 0.4*  IBILI 1.0*   PT/INR Recent Labs    11/04/23 1957  LABPROT 14.0  INR 1.0    Studies/Results: ECHOCARDIOGRAM COMPLETE Result Date: 11/05/2023    ECHOCARDIOGRAM REPORT   Patient Name:   GEAN LAROSE Date of Exam: 11/05/2023 Medical Rec #:  [id number]     Height:       68.4 in Accession #:    [id number]    Weight:       182.5 lb Date of Birth:  Sep 02, 1933     BSA:          1.974 m Patient Age:    89 years      BP:           149/77 mmHg Patient Gender: M             HR:           70 bpm. Exam Location:  Inpatient Procedure: 2D Echo (Both Spectral and Color Flow Doppler were utilized during            procedure). Indications:    Stroke  History:        Patient has prior history of Echocardiogram examinations. Risk                 Factors:Hypertension.   Sonographer:    Charmaine Gaskins Referring Phys: [id number] SUBRINA SUNDIL IMPRESSIONS  1. Left ventricular ejection fraction, by estimation, is 60 to 65%. The left ventricle has normal function. The left ventricle has no regional wall motion abnormalities. Left ventricular diastolic parameters are consistent with Grade I diastolic dysfunction (impaired relaxation).  2. Right ventricular systolic function is normal. The right ventricular size is normal. There is normal pulmonary artery systolic pressure.  3. Left atrial size was mildly dilated.  4. The mitral valve is normal in structure. Trivial mitral valve regurgitation. No evidence of mitral stenosis.  5. The aortic valve is tricuspid. There is mild calcification of the aortic valve. Aortic valve regurgitation is not visualized. Aortic valve sclerosis/calcification is present, without any evidence of aortic stenosis.  6. The inferior vena cava is  normal in size with greater than 50% respiratory variability, suggesting right atrial pressure of 3 mmHg. FINDINGS  Left Ventricle: Left ventricular ejection fraction, by estimation, is 60 to 65%. The left ventricle has normal function. The left ventricle has no regional wall motion abnormalities. The left ventricular internal cavity size was normal in size. There is  no left ventricular hypertrophy. Left ventricular diastolic parameters are consistent with Grade I diastolic dysfunction (impaired relaxation). Right Ventricle: The right ventricular size is normal. No increase in right ventricular wall thickness. Right ventricular systolic function is normal. There is normal pulmonary artery systolic pressure. The tricuspid regurgitant velocity is 2.58 m/s, and  with an assumed right atrial pressure of 3 mmHg, the estimated right ventricular systolic pressure is 29.6 mmHg. Left Atrium: Left atrial size was mildly dilated. Right Atrium: Right atrial size was normal in size. Pericardium: There is no evidence of pericardial  effusion. Mitral Valve: The mitral valve is normal in structure. Trivial mitral valve regurgitation. No evidence of mitral valve stenosis. Tricuspid Valve: The tricuspid valve is normal in structure. Tricuspid valve regurgitation is mild . No evidence of tricuspid stenosis. Aortic Valve: The aortic valve is tricuspid. There is mild calcification of the aortic valve. Aortic valve regurgitation is not visualized. Aortic valve sclerosis/calcification is present, without any evidence of aortic stenosis. Pulmonic Valve: The pulmonic valve was normal in structure. Pulmonic valve regurgitation is trivial. No evidence of pulmonic stenosis. Aorta: The aortic root is normal in size and structure. Venous: The inferior vena cava is normal in size with greater than 50% respiratory variability, suggesting right atrial pressure of 3 mmHg. IAS/Shunts: No atrial level shunt detected by color flow Doppler.  LEFT VENTRICLE PLAX 2D LVIDd:         4.00 cm   Diastology LVIDs:         2.50 cm   LV e' medial:    8.49 cm/s LV PW:         0.90 cm   LV E/e' medial:  9.8 LV IVS:        0.90 cm   LV e' lateral:   9.36 cm/s LVOT diam:     2.00 cm   LV E/e' lateral: 8.9 LVOT Area:     3.14 cm  RIGHT VENTRICLE RV Basal diam:  2.80 cm RV Mid diam:    2.80 cm RV S prime:     12.80 cm/s LEFT ATRIUM             Index        RIGHT ATRIUM           Index LA diam:        3.00 cm 1.52 cm/m   RA Area:     12.50 cm LA Vol (A2C):   52.5 ml 26.59 ml/m  RA Volume:   24.60 ml  12.46 ml/m LA Vol (A4C):   69.6 ml 35.26 ml/m LA Biplane Vol: 61.1 ml 30.95 ml/m   AORTA Ao Root diam: 3.20 cm Ao Asc diam:  3.30 cm MITRAL VALVE                TRICUSPID VALVE MV Area (PHT): 3.89 cm     TR Peak grad:   26.6 mmHg MV Decel Time: 195 msec     TR Vmax:        258.00 cm/s MV E velocity: 83.30 cm/s MV A velocity: 114.00 cm/s  SHUNTS MV E/A ratio:  0.73  Systemic Diam: 2.00 cm Toribio Fuel MD Electronically signed by Toribio Fuel MD Signature Date/Time:  11/05/2023/3:30:13 PM    Final    MR BRAIN WO CONTRAST Result Date: 11/05/2023 EXAM: MR Brain without Intravenous Contrast. CLINICAL HISTORY: Acute CVA. TECHNIQUE: Magnetic resonance images of the brain without intravenous contrast in multiple planes. CONTRAST: Without. COMPARISON: MRI head Jun 08, 2023 FINDINGS: BRAIN: Small acute perforator infarct in the left basal ganglia. Moderate T2/FLAIR hyperintensities in the white matter, compatible with chronic microvascular ischemic change. No intracranial mass or hemorrhage. No midline shift or extra-axial fluid collection. No cerebellar tonsillar ectopia. The central arterial and venous flow voids are patent. VENTRICLES: No hydrocephalus. ORBITS: The orbits are normal. SINUSES AND MASTOIDS: The sinuses and mastoid air cells are clear. BONES: No acute fracture or focal osseous lesion. IMPRESSION: 1. Small acute perforator infarct in the left basal ganglia. Electronically signed by: Gilmore Molt MD 11/05/2023 03:15 AM EDT RP Workstation: [id number]   CT ABDOMEN PELVIS WO CONTRAST Result Date: 11/04/2023 CLINICAL DATA:  Acute abdominal pain EXAM: CT ABDOMEN AND PELVIS WITHOUT CONTRAST TECHNIQUE: Multidetector CT imaging of the abdomen and pelvis was performed following the standard protocol without IV contrast. RADIATION DOSE REDUCTION: This exam was performed according to the departmental dose-optimization program which includes automated exposure control, adjustment of the mA and/or kV according to patient size and/or use of iterative reconstruction technique. COMPARISON:  06/08/2023 FINDINGS: Lower chest: No acute abnormality. Hepatobiliary: Liver again demonstrates multiple cysts similar to that seen on the prior exam. Gallbladder demonstrates a normal appearance. No definitive hepatic mass is seen. Pancreas: Unremarkable. No pancreatic ductal dilatation or surrounding inflammatory changes. Spleen: Normal in size without focal abnormality. Adrenals/Urinary  Tract: Adrenal glands are within normal limits. 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 Evaluation for calculi is limited due to contrast enhancement from prior CT of head. No obstructive changes are seen. The bladder is well distended with opacified urine. Indentation upon the inferior aspect of the bladder related to the prostate is noted. Stomach/Bowel: No obstructive or inflammatory changes of colon are noted. Mild fecal material is seen throughout the colon. The appendix is within normal limits. Small bowel and stomach are unremarkable. Vascular/Lymphatic: Aortic atherosclerosis. No enlarged abdominal or pelvic lymph nodes. Reproductive: Prostate is enlarged in size. Other: No abdominal wall hernia or abnormality. No abdominopelvic ascites. Musculoskeletal: No acute or significant osseous findings. IMPRESSION: Chronic changes as described above.  No acute abnormality is noted. Electronically Signed   By: Oneil Devonshire M.D.   On: 11/04/2023 23:52   US  Abdomen Limited RUQ (LIVER/GB) Result Date: 11/04/2023 CLINICAL DATA:  [id number] Transaminitis [id number] EXAM: ULTRASOUND ABDOMEN LIMITED RIGHT UPPER QUADRANT COMPARISON:  CT abdomen pelvis 11/04/2023 FINDINGS: Gallbladder: No gallstones or wall thickening visualized. No sonographic Murphy sign noted by sonographer. Common bile duct: Diameter: 3mm Liver: Multiple simple hepatic cysts. Increased parenchymal echogenicity. Portal vein is patent on color Doppler imaging with normal direction of blood flow towards the liver. Other: None. IMPRESSION: Hepatic steatosis. Please note limited evaluation for focal hepatic masses in a patient with hepatic steatosis due to decreased penetration of the acoustic ultrasound waves. Electronically Signed   By: Morgane  Naveau M.D.   On: 11/04/2023 23:21   CT ANGIO HEAD NECK W WO CM (CODE STROKE) Result Date: 11/04/2023 EXAM: CT HEAD WITHOUT CTA HEAD AND NECK WITH AND WITHOUT 11/04/2023 08:03:44  PM TECHNIQUE: CTA of the head and neck was performed with and without the administration of  intravenous contrast. Noncontrast CT of the head with reconstructed 2-D images are also provided for review. Multiplanar 2D and/or 3D reformatted images are provided for review. Automated exposure control, iterative reconstruction, and/or weight based adjustment of the mA/kV was utilized to reduce the radiation dose to as low as reasonably achievable. COMPARISON: None available CLINICAL HISTORY: Neuro deficit, acute, stroke suspected. Right arm drift; Facial droop. FINDINGS: AORTIC ARCH AND ARCH VESSELS: No dissection or arterial injury. No significant stenosis of the brachiocephalic or subclavian arteries. CERVICAL CAROTID ARTERIES: No dissection, arterial injury, or hemodynamically significant stenosis of the right carotid. Approximately 50% stenosis of the left ICA proximally due to atherosclerosis. CERVICAL VERTEBRAL ARTERIES: Severe right vertebral artery origin stenosis. Moderate stenosis of the distal right vertebral artery near its dural margin. LUNGS AND MEDIASTINUM: Unremarkable. SOFT TISSUES: No acute abnormality. BONES: No acute abnormality. ANTERIOR CIRCULATION: No significant stenosis of the internal carotid arteries. No significant stenosis of the anterior cerebral arteries. No significant stenosis of the middle cerebral arteries. No aneurysm. POSTERIOR CIRCULATION: No significant stenosis of the posterior cerebral arteries. No significant stenosis of the basilar artery. No significant stenosis of the vertebral arteries. No aneurysm. OTHER: No dural venous sinus thrombosis on this non-dedicated study. IMPRESSION: 1. Approximately 50% stenosis of the left ICA proximally due to atherosclerosis. 2. Severe right vertebral artery origin stenosis and moderate stenosis of the distal right vertebral artery near its dural margin. Electronically signed by: Gilmore Molt MD 11/04/2023 08:26 PM EDT RP Workstation:  [id number]   CT HEAD CODE STROKE WO CONTRAST Result Date: 11/04/2023 EXAM: CT HEAD WITHOUT CONTRAST 11/04/2023 08:02:33 PM TECHNIQUE: CT of the head was performed without the administration of intravenous contrast. Automated exposure control, iterative reconstruction, and/or weight based adjustment of the mA/kV was utilized to reduce the radiation dose to as low as reasonably achievable. COMPARISON: None available. CLINICAL HISTORY: Neuro deficit, acute, stroke suspected. No contrast; CT HEAD CODE STROKE WO CONTRAST FINDINGS: BRAIN AND VENTRICLES: No acute hemorrhage. No evidence of acute infarct. No hydrocephalus. No extra-axial collection. No mass effect or midline shift. Patchy white matter hypodensities are compatible with chronic microvascular ischemic change. ORBITS: No acute abnormality. SINUSES: No acute abnormality. SOFT TISSUES AND SKULL: No acute soft tissue abnormality. No skull fracture. IMPRESSION: 1. No acute intracranial abnormality. Findings conveyed to Dr. Vanessa via telephone at 8:09 PM. Electronically signed by: Gilmore Molt MD 11/04/2023 08:10 PM EDT RP Workstation: [id number]       Assessment / Plan:    89 y/o male here with the following:  Acute CVA Elevated liver enzymes  Liver enzymes continue to downtrend, ALT 107, AST 61, T. bili 1.4.  Ultrasound and CT did not show any clear cause.  His, bile duct is normal, there is no gallstones.  He has not had any pain in his abdomen during his admission.  He did report some chills overnight but he is not febrile and no leukocytosis, WBC is 6.  Acute viral hepatitis panel is negative, CMV pending.  No new meds he endorses that would have caused this transient elevation in his liver enzymes.  Unclear if he had some transient hypotension at home during time of CVA?  For now, enzymes are downtrending and would continue to check daily until normalization.  As long as they continue to downtrend no further workup should be  needed.  Will sign off for now, call with questions moving forward.  Marcey Naval, MD [facility]

## 2023-11-06 NOTE — Plan of Care (Signed)
   Problem: Education: Goal: Knowledge of disease or condition will improve Outcome: Progressing   Problem: Ischemic Stroke/TIA Tissue Perfusion: Goal: Complications of ischemic stroke/TIA will be minimized Outcome: Progressing

## 2023-11-06 NOTE — TOC CAGE-AID Note (Signed)
 Transition of Care [facility]) - CAGE-AID Screening   Patient Details  Name: Robert Pacheco MRN: [id number] Date of Birth: March 17, 1933  [facility] CM/SW Contact:    Jesyca Weisenburger E Tamico Mundo, LCSW Phone Number: 11/06/2023, 9:15 AM   Clinical Narrative: No SA noted.   CAGE-AID Screening:    Have You Ever Felt You Ought to Cut Down on Your Drinking or Drug Use?: No Have People Annoyed You By Critizing Your Drinking Or Drug Use?: No Have You Felt Bad Or Guilty About Your Drinking Or Drug Use?: No Have You Ever Had a Drink or Used Drugs First Thing In The Morning to Steady Your Nerves or to Get Rid of a Hangover?: No CAGE-AID Score: 0  Substance Abuse Education Offered: No

## 2023-11-06 NOTE — Progress Notes (Signed)
 Physical Therapy Treatment Patient Details Name: Robert Pacheco MRN: [id number] DOB: February 03, 1933 Today's Date: 11/06/2023   History of Present Illness 89 year old male presented with facial droop and right-sided weakness. + Acute ischemic CVA.SABRA PMH: TIA, CAD status post CABG 2000, HTN, paroxysmal atrial tachycardia on Zio patch, HLD.    PT Comments  Pt required CGA transfers and amb 350' without AD. Mildly unsteady gait. Will benefit from [facility] for gait increased distances. Current POC remains appropriate.     If plan is discharge home, recommend the following: A little help with walking and/or transfers;Assistance with cooking/housework;Help with stairs or ramp for entrance;Assist for transportation   Can travel by private vehicle        Equipment Recommendations  Cane    Recommendations for Other Services       Precautions / Restrictions Precautions Precautions: Fall Recall of Precautions/Restrictions: Intact     Mobility  Bed Mobility Overal bed mobility: Modified Independent                  Transfers Overall transfer level: Needs assistance Equipment used: None Transfers: Sit to/from Stand Sit to Stand: Contact guard assist                Ambulation/Gait Ambulation/Gait assistance: Contact guard assist Gait Distance (Feet): 350 Feet Assistive device: None Gait Pattern/deviations: Step-through pattern, Decreased stride length, Decreased weight shift to left, Decreased stance time - right Gait velocity: decreased Gait velocity interpretation: 1.31 - 2.62 ft/sec, indicative of limited community ambulator   General Gait Details: Mildly unsteady. Will benefit from cane for extended distances.   Stairs Stairs:  (simulated stairs in room with high step march, CGA)           Wheelchair Mobility     Tilt Bed    Modified Rankin (Stroke Patients Only) Modified Rankin (Stroke Patients Only) Pre-Morbid Rankin Score: No significant  disability Modified Rankin: Moderate disability     Balance Overall balance assessment: Needs assistance Sitting-balance support: Feet supported, No upper extremity supported Sitting balance-Leahy Scale: Good     Standing balance support: No upper extremity supported, During functional activity, Single extremity supported Standing balance-Leahy Scale: Fair                              Hotel manager: Impaired Factors Affecting Communication: Hearing impaired  Cognition Arousal: Alert Behavior During Therapy: WFL for tasks assessed/performed   PT - Cognitive impairments: No apparent impairments                         Following commands: Intact      Cueing Cueing Techniques: Verbal cues  Exercises      General Comments General comments (skin integrity, edema, etc.): VSS on RA      Pertinent Vitals/Pain Pain Assessment Pain Assessment: No/denies pain    Home Living                          Prior Function            PT Goals (current goals can now be found in the care plan section) Acute Rehab PT Goals Patient Stated Goal: home Progress towards PT goals: Progressing toward goals    Frequency    Min 3X/week      PT Plan      Co-evaluation  AM-PAC PT 6 Clicks Mobility   Outcome Measure  Help needed turning from your back to your side while in a flat bed without using bedrails?: None Help needed moving from lying on your back to sitting on the side of a flat bed without using bedrails?: None Help needed moving to and from a bed to a chair (including a wheelchair)?: A Little Help needed standing up from a chair using your arms (e.g., wheelchair or bedside chair)?: A Little Help needed to walk in hospital room?: A Little Help needed climbing 3-5 steps with a railing? : A Little 6 Click Score: 20    End of Session Equipment Utilized During Treatment: Gait belt Activity  Tolerance: Patient tolerated treatment well Patient left: in chair;with chair alarm set;with call bell/phone within reach Nurse Communication: Mobility status PT Visit Diagnosis: Unsteadiness on feet (R26.81);Other abnormalities of gait and mobility (R26.89);Muscle weakness (generalized) (M62.81);History of falling (Z91.81);Difficulty in walking, not elsewhere classified (R26.2);Other symptoms and signs involving the nervous system (R29.898)     Time: 9057-8993 PT Time Calculation (min) (ACUTE ONLY): 24 min  Charges:    $Gait Training: 23-37 mins PT General Charges $$ ACUTE PT VISIT: 1 Visit                     Sari MATSU., PT  Office # [phone]    Erven Sari Shaker 11/06/2023, 11:06 AM

## 2023-11-06 NOTE — Progress Notes (Addendum)
 PROGRESS NOTE        PATIENT DETAILS Name: Robert Pacheco Age: 89 y.o. Sex: male Date of Birth: 25-Aug-1933 Admit Date: 11/04/2023 Admitting Physician Micaela Speaker, MD ERE:Tzwiopwh, [name], DO  Brief Summary: Patient is a 89 y.o.  male with history of CAD s/p CABG, HTN, PSVT (seen on Zio patch 2022), HLD-who presented with acute ischemic CVA and transaminitis.  Significant events: 10/9>> admit to [facility].  Significant studies: 10/8>> CTA head/neck: 50% stenosis left ICA, severe right vertebral artery origin stenosis. 10/8>> CT abdomen/pelvis: No acute abnormalities. 10/8>> RUQ ultrasound: Hepatic steatosis. 10/9>> MRI brain: Acute perforator infarct left basal ganglia. 10/9>> echo: EF 60-65% 10/9>> LDL: 42 10/9>> A1c: 5.3  Significant microbiology data: 10/9>> COVID/influenza/RSV PCR: Negative 10/9>> blood cultures: No growth 10/9>> acute hepatitis serology: Negative 10/10>> respiratory virus panel: Negative  Procedures: None  Consults: Neurology GI  Subjective: Lying comfortably in bed-denies any chest pain or shortness of breath.  Complains of intermittent chills for the past 1 day.  Objective: Vitals: Blood pressure 131/61, pulse 90, temperature 98.8 F (37.1 C), resp. rate 17, height 5' 8.4 (1.737 m), weight 82.8 kg, SpO2 92%.   Exam: Gen Exam:Alert awake-not in any distress HEENT:atraumatic, normocephalic Chest: B/L clear to auscultation anteriorly CVS:S1S2 regular Abdomen:soft non tender, non distended Extremities:no edema Neurology: Non focal Skin: no rash  Pertinent Labs/Radiology:    Latest Ref Rng & Units 11/06/2023    3:02 AM 11/05/2023    3:40 AM 11/04/2023    7:57 PM  CBC  WBC 4.0 - 10.5 K/uL 6.0  10.0  12.1   Hemoglobin 13.0 - 17.0 g/dL 87.0  86.5  86.0   Hematocrit 39.0 - 52.0 % 39.1  40.0  41.3   Platelets 150 - 400 K/uL 145  183  192     Lab Results  Component Value Date   NA 138 11/05/2023   K 3.6  11/05/2023   CL 103 11/05/2023   CO2 24 11/05/2023      Assessment/Plan: 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  Left carotid artery stenosis (50% stenosis by CTA) Continue DAPT/statin Outpatient follow-up with vascular surgery Not felt to be symptomatic currently-as CVA is from small vessel disease.  Transaminitis Unclear etiology Statin on hold Awaiting LFTs this morning. Acute hepatitis serology negative Awaiting CMV/EBV PCR.  Low-grade fever Occurred on 10/9 Per nursing staff-he is having chills but when they have checked oral temp-no fever. No focus of infection apparent-apart from ongoing transaminitis Cultures negative Given overall stablity-monitor off antibiotics Discussed with nursing staff-if/when chills recur-if oral temp is negative-check rectal temp.  HLD Statin on hold due to transaminitis.  HTN BP stable Allow permissive hypertension given acute CVA  History of PSVT/PACs/PVC (Zio patch 2022) Telemetry monitoring Resume beta-blocker in the next day or so.   Code status:   Code Status: Full Code   DVT Prophylaxis: enoxaparin (LOVENOX) injection 40 mg Start: 11/05/23 1400 SCD's Start: 11/05/23 0203    Family Communication: None at bedside   Disposition Plan: Status is: Inpatient Remains inpatient appropriate because: Severity of illness   Planned Discharge Destination:Home   Diet: Diet Order             Diet Heart Room service appropriate? Yes; Fluid consistency: Thin  Diet effective now                     Antimicrobial agents: Anti-infectives (From admission, onward)    None        MEDICATIONS: Scheduled Meds:  aspirin  EC  81 mg Oral Daily   clopidogrel  75 mg Oral Daily   enoxaparin (LOVENOX) injection  40 mg Subcutaneous Q24H   psyllium  1 packet Oral Daily   Continuous Infusions:  sodium  chloride 75 mL/hr at 11/06/23 0357   PRN Meds:.acetaminophen , guaiFENesin, hydrALAZINE, ipratropium-albuterol, metoprolol  tartrate, ondansetron (ZOFRAN) IV, polyethylene glycol, senna-docusate   I have personally reviewed following labs and imaging studies  LABORATORY DATA: CBC: Recent Labs  Lab 11/04/23 0942 11/04/23 1956 11/04/23 1957 11/05/23 0340 11/06/23 0302  WBC 9.3  --  12.1* 10.0 6.0  NEUTROABS 8.2*  --  10.9*  --   --   HGB 14.4 13.9 13.9 13.4 12.9*  HCT 43.2 41.0 41.3 40.0 39.1  MCV 95.2  --  92.8 94.6 95.4  PLT 199  --  192 183 145*    Basic Metabolic Panel: Recent Labs  Lab 11/04/23 0942 11/04/23 1956 11/04/23 1957 11/05/23 0340  NA 140 140 139 138  K 3.8 3.4* 3.4* 3.6  CL 107 104 105 103  CO2 21*  --  22 24  GLUCOSE 149* 123* 121* 119*  BUN 15 16 15 14   CREATININE 0.98 0.90 1.00 1.05  CALCIUM 8.7*  --  8.7* 8.6*    GFR: Estimated Creatinine Clearance: 47.7 mL/min (by C-G formula based on SCr of 1.05 mg/dL).  Liver Function Tests: Recent Labs  Lab 11/04/23 0942 11/04/23 1957 11/05/23 0340  AST 63* 289* 193*  ALT 34 243* 206*  ALKPHOS 79 119 110  BILITOT 1.2 2.8* 2.7*  PROT 6.2* 6.4* 5.9*  ALBUMIN 3.5 3.5 3.2*   Recent Labs  Lab 11/04/23 0942  LIPASE 52*   No results for input(s): AMMONIA in the last 168 hours.  Coagulation Profile: Recent Labs  Lab 11/04/23 1957  INR 1.0    Cardiac Enzymes: No results for input(s): CKTOTAL, CKMB, CKMBINDEX, TROPONINI in the last 168 hours.  BNP (last 3 results) No results for input(s): PROBNP in the last 8760 hours.  Lipid Profile: Recent Labs    11/05/23 0340  CHOL 91  HDL 39*  LDLCALC 42  TRIG 48  CHOLHDL 2.3    Thyroid Function Tests: No results for input(s): TSH, T4TOTAL, FREET4, T3FREE, THYROIDAB in the last 72 hours.  Anemia Panel: No results for input(s): VITAMINB12, FOLATE, FERRITIN, TIBC, IRON, RETICCTPCT in the last 72 hours.  Urine  analysis:    Component Value Date/Time   COLORURINE AMBER (A) 11/04/2023 0924   APPEARANCEUR HAZY (A) 11/04/2023 0924   LABSPEC 1.019 11/04/2023 0924   PHURINE 5.0 11/04/2023 0924   GLUCOSEU NEGATIVE 11/04/2023 0924   GLUCOSEU NEGATIVE 02/18/2023 1023   HGBUR NEGATIVE 11/04/2023 0924   HGBUR negative 03/27/2009 0000   BILIRUBINUR NEGATIVE 11/04/2023 0924   BILIRUBINUR n 07/28/2013 1251   KETONESUR NEGATIVE 11/04/2023 0924   PROTEINUR 100 (A) 11/04/2023 0924   UROBILINOGEN 1.0 02/18/2023 1023   NITRITE NEGATIVE 11/04/2023 0924   LEUKOCYTESUR NEGATIVE 11/04/2023 0924    Sepsis Labs: Lactic Acid, Venous    Component Value Date/Time   LATICACIDVEN 1.1 08/07/2022 1959    MICROBIOLOGY: Recent Results (from the past 240 hours)  Resp panel by RT-PCR (RSV, Flu A&B, Covid) Anterior Nasal Swab  Status: None   Collection Time: 11/05/23 11:14 AM   Specimen: Anterior Nasal Swab  Result Value Ref Range Status   SARS Coronavirus 2 by RT PCR NEGATIVE NEGATIVE Final   Influenza A by PCR NEGATIVE NEGATIVE Final   Influenza B by PCR NEGATIVE NEGATIVE Final    Comment: (NOTE) The Xpert Xpress SARS-CoV-2/FLU/RSV plus assay is intended as an aid in the diagnosis of influenza from Nasopharyngeal swab specimens and should not be used as a sole basis for treatment. Nasal washings and aspirates are unacceptable for Xpert Xpress SARS-CoV-2/FLU/RSV testing.  Fact Sheet for Patients: BloggerCourse.com  Fact Sheet for Healthcare Providers: SeriousBroker.it  This test is not yet approved or cleared by the [place]  FDA and has been authorized for detection and/or diagnosis of SARS-CoV-2 by FDA under an Emergency Use Authorization (EUA). This EUA will remain in effect (meaning this test can be used) for the duration of the COVID-19 declaration under Section 564(b)(1) of the Act, 21 U.S.C. section 360bbb-3(b)(1), unless the authorization is  terminated or revoked.     Resp Syncytial Virus by PCR NEGATIVE NEGATIVE Final    Comment: (NOTE) Fact Sheet for Patients: BloggerCourse.com  Fact Sheet for Healthcare Providers: SeriousBroker.it  This test is not yet approved or cleared by the [place]  FDA and has been authorized for detection and/or diagnosis of SARS-CoV-2 by FDA under an Emergency Use Authorization (EUA). This EUA will remain in effect (meaning this test can be used) for the duration of the COVID-19 declaration under Section 564(b)(1) of the Act, 21 U.S.C. section 360bbb-3(b)(1), unless the authorization is terminated or revoked.  Performed at [facility], [address], [place], [place] [address]   Culture, blood (Routine X 2) w Reflex to ID Panel     Status: None (Preliminary result)   Collection Time: 11/05/23  6:51 PM   Specimen: BLOOD RIGHT ARM  Result Value Ref Range Status   Specimen Description BLOOD RIGHT ARM  Final   Special Requests   Final    BOTTLES DRAWN AEROBIC AND ANAEROBIC Blood Culture adequate volume   Culture   Final    NO GROWTH < 12 HOURS Performed at [facility], [address], [place], [place] [address]    Report Status PENDING  Incomplete  Culture, blood (Routine X 2) w Reflex to ID Panel     Status: None (Preliminary result)   Collection Time: 11/05/23  6:57 PM   Specimen: BLOOD RIGHT HAND  Result Value Ref Range Status   Specimen Description BLOOD RIGHT HAND  Final   Special Requests   Final    BOTTLES DRAWN AEROBIC AND ANAEROBIC Blood Culture adequate volume   Culture   Final    NO GROWTH < 12 HOURS Performed at [facility], [address] [place], [place] [address]    Report Status PENDING  Incomplete  Respiratory (~20 pathogens) panel by PCR     Status: None   Collection Time: 11/06/23  2:05 AM   Specimen: Nasopharyngeal Swab; Respiratory  Result Value Ref Range Status   Adenovirus NOT  DETECTED NOT DETECTED Final   Coronavirus 229E NOT DETECTED NOT DETECTED Final    Comment: (NOTE) The Coronavirus on the Respiratory Panel, DOES NOT test for the novel  Coronavirus (2019 nCoV)    Coronavirus HKU1 NOT DETECTED NOT DETECTED Final   Coronavirus NL63 NOT DETECTED NOT DETECTED Final   Coronavirus OC43 NOT DETECTED NOT DETECTED Final   Metapneumovirus NOT DETECTED  NOT DETECTED Final   Rhinovirus / Enterovirus NOT DETECTED NOT DETECTED Final   Influenza A NOT DETECTED NOT DETECTED Final   Influenza B NOT DETECTED NOT DETECTED Final   Parainfluenza Virus 1 NOT DETECTED NOT DETECTED Final   Parainfluenza Virus 2 NOT DETECTED NOT DETECTED Final   Parainfluenza Virus 3 NOT DETECTED NOT DETECTED Final   Parainfluenza Virus 4 NOT DETECTED NOT DETECTED Final   Respiratory Syncytial Virus NOT DETECTED NOT DETECTED Final   Bordetella pertussis NOT DETECTED NOT DETECTED Final   Bordetella Parapertussis NOT DETECTED NOT DETECTED Final   Chlamydophila pneumoniae NOT DETECTED NOT DETECTED Final   Mycoplasma pneumoniae NOT DETECTED NOT DETECTED Final    Comment: Performed at [facility], [address], [place], [place] [address]    RADIOLOGY STUDIES/RESULTS: ECHOCARDIOGRAM COMPLETE Result Date: 11/05/2023    ECHOCARDIOGRAM REPORT   Patient Name:   ROXAS CLYMER Date of Exam: 11/05/2023 Medical Rec #:  [id number]     Height:       68.4 in Accession #:    [id number]    Weight:       182.5 lb Date of Birth:  1933-07-30     BSA:          1.974 m Patient Age:    89 years      BP:           149/77 mmHg Patient Gender: M             HR:           70 bpm. Exam Location:  Inpatient Procedure: 2D Echo (Both Spectral and Color Flow Doppler were utilized during            procedure). Indications:    Stroke  History:        Patient has prior history of Echocardiogram examinations. Risk                 Factors:Hypertension.  Sonographer:    Charmaine Gaskins Referring Phys: [id number] SUBRINA SUNDIL  IMPRESSIONS  1. Left ventricular ejection fraction, by estimation, is 60 to 65%. The left ventricle has normal function. The left ventricle has no regional wall motion abnormalities. Left ventricular diastolic parameters are consistent with Grade I diastolic dysfunction (impaired relaxation).  2. Right ventricular systolic function is normal. The right ventricular size is normal. There is normal pulmonary artery systolic pressure.  3. Left atrial size was mildly dilated.  4. The mitral valve is normal in structure. Trivial mitral valve regurgitation. No evidence of mitral stenosis.  5. The aortic valve is tricuspid. There is mild calcification of the aortic valve. Aortic valve regurgitation is not visualized. Aortic valve sclerosis/calcification is present, without any evidence of aortic stenosis.  6. The inferior vena cava is normal in size with greater than 50% respiratory variability, suggesting right atrial pressure of 3 mmHg. FINDINGS  Left Ventricle: Left ventricular ejection fraction, by estimation, is 60 to 65%. The left ventricle has normal function. The left ventricle has no regional wall motion abnormalities. The left ventricular internal cavity size was normal in size. There is  no left ventricular hypertrophy. Left ventricular diastolic parameters are consistent with Grade I diastolic dysfunction (impaired relaxation). Right Ventricle: The right ventricular size is normal. No increase in right ventricular wall thickness. Right ventricular systolic function is normal. There is normal pulmonary artery systolic pressure. The tricuspid regurgitant velocity is 2.58 m/s, and  with an assumed right atrial pressure of 3 mmHg,  the estimated right ventricular systolic pressure is 29.6 mmHg. Left Atrium: Left atrial size was mildly dilated. Right Atrium: Right atrial size was normal in size. Pericardium: There is no evidence of pericardial effusion. Mitral Valve: The mitral valve is normal in structure. Trivial  mitral valve regurgitation. No evidence of mitral valve stenosis. Tricuspid Valve: The tricuspid valve is normal in structure. Tricuspid valve regurgitation is mild . No evidence of tricuspid stenosis. Aortic Valve: The aortic valve is tricuspid. There is mild calcification of the aortic valve. Aortic valve regurgitation is not visualized. Aortic valve sclerosis/calcification is present, without any evidence of aortic stenosis. Pulmonic Valve: The pulmonic valve was normal in structure. Pulmonic valve regurgitation is trivial. No evidence of pulmonic stenosis. Aorta: The aortic root is normal in size and structure. Venous: The inferior vena cava is normal in size with greater than 50% respiratory variability, suggesting right atrial pressure of 3 mmHg. IAS/Shunts: No atrial level shunt detected by color flow Doppler.  LEFT VENTRICLE PLAX 2D LVIDd:         4.00 cm   Diastology LVIDs:         2.50 cm   LV e' medial:    8.49 cm/s LV PW:         0.90 cm   LV E/e' medial:  9.8 LV IVS:        0.90 cm   LV e' lateral:   9.36 cm/s LVOT diam:     2.00 cm   LV E/e' lateral: 8.9 LVOT Area:     3.14 cm  RIGHT VENTRICLE RV Basal diam:  2.80 cm RV Mid diam:    2.80 cm RV S prime:     12.80 cm/s LEFT ATRIUM             Index        RIGHT ATRIUM           Index LA diam:        3.00 cm 1.52 cm/m   RA Area:     12.50 cm LA Vol (A2C):   52.5 ml 26.59 ml/m  RA Volume:   24.60 ml  12.46 ml/m LA Vol (A4C):   69.6 ml 35.26 ml/m LA Biplane Vol: 61.1 ml 30.95 ml/m   AORTA Ao Root diam: 3.20 cm Ao Asc diam:  3.30 cm MITRAL VALVE                TRICUSPID VALVE MV Area (PHT): 3.89 cm     TR Peak grad:   26.6 mmHg MV Decel Time: 195 msec     TR Vmax:        258.00 cm/s MV E velocity: 83.30 cm/s MV A velocity: 114.00 cm/s  SHUNTS MV E/A ratio:  0.73         Systemic Diam: 2.00 cm Toribio Fuel MD Electronically signed by Toribio Fuel MD Signature Date/Time: 11/05/2023/3:30:13 PM    Final    MR BRAIN WO CONTRAST Result Date:  11/05/2023 EXAM: MR Brain without Intravenous Contrast. CLINICAL HISTORY: Acute CVA. TECHNIQUE: Magnetic resonance images of the brain without intravenous contrast in multiple planes. CONTRAST: Without. COMPARISON: MRI head Jun 08, 2023 FINDINGS: BRAIN: Small acute perforator infarct in the left basal ganglia. Moderate T2/FLAIR hyperintensities in the white matter, compatible with chronic microvascular ischemic change. No intracranial mass or hemorrhage. No midline shift or extra-axial fluid collection. No cerebellar tonsillar ectopia. The central arterial and venous flow voids are patent. VENTRICLES: No hydrocephalus. ORBITS: The orbits are  normal. SINUSES AND MASTOIDS: The sinuses and mastoid air cells are clear. BONES: No acute fracture or focal osseous lesion. IMPRESSION: 1. Small acute perforator infarct in the left basal ganglia. Electronically signed by: Gilmore Molt MD 11/05/2023 03:15 AM EDT RP Workstation: [id number]   CT ABDOMEN PELVIS WO CONTRAST Result Date: 11/04/2023 CLINICAL DATA:  Acute abdominal pain EXAM: CT ABDOMEN AND PELVIS WITHOUT CONTRAST TECHNIQUE: Multidetector CT imaging of the abdomen and pelvis was performed following the standard protocol without IV contrast. RADIATION DOSE REDUCTION: This exam was performed according to the departmental dose-optimization program which includes automated exposure control, adjustment of the mA and/or kV according to patient size and/or use of iterative reconstruction technique. COMPARISON:  06/08/2023 FINDINGS: Lower chest: No acute abnormality. Hepatobiliary: Liver again demonstrates multiple cysts similar to that seen on the prior exam. Gallbladder demonstrates a normal appearance. No definitive hepatic mass is seen. Pancreas: Unremarkable. No pancreatic ductal dilatation or surrounding inflammatory changes. Spleen: Normal in size without focal abnormality. Adrenals/Urinary Tract: Adrenal glands are within normal limits. 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 Evaluation for calculi is limited due to contrast enhancement from prior CT of head. No obstructive changes are seen. The bladder is well distended with opacified urine. Indentation upon the inferior aspect of the bladder related to the prostate is noted. Stomach/Bowel: No obstructive or inflammatory changes of colon are noted. Mild fecal material is seen throughout the colon. The appendix is within normal limits. Small bowel and stomach are unremarkable. Vascular/Lymphatic: Aortic atherosclerosis. No enlarged abdominal or pelvic lymph nodes. Reproductive: Prostate is enlarged in size. Other: No abdominal wall hernia or abnormality. No abdominopelvic ascites. Musculoskeletal: No acute or significant osseous findings. IMPRESSION: Chronic changes as described above.  No acute abnormality is noted. Electronically Signed   By: Oneil Devonshire M.D.   On: 11/04/2023 23:52   US  Abdomen Limited RUQ (LIVER/GB) Result Date: 11/04/2023 CLINICAL DATA:  [id number] Transaminitis [id number] EXAM: ULTRASOUND ABDOMEN LIMITED RIGHT UPPER QUADRANT COMPARISON:  CT abdomen pelvis 11/04/2023 FINDINGS: Gallbladder: No gallstones or wall thickening visualized. No sonographic Murphy sign noted by sonographer. Common bile duct: Diameter: 3mm Liver: Multiple simple hepatic cysts. Increased parenchymal echogenicity. Portal vein is patent on color Doppler imaging with normal direction of blood flow towards the liver. Other: None. IMPRESSION: Hepatic steatosis. Please note limited evaluation for focal hepatic masses in a patient with hepatic steatosis due to decreased penetration of the acoustic ultrasound waves. Electronically Signed   By: Morgane  Naveau M.D.   On: 11/04/2023 23:21   CT ANGIO HEAD NECK W WO CM (CODE STROKE) Result Date: 11/04/2023 EXAM: CT HEAD WITHOUT CTA HEAD AND NECK WITH AND WITHOUT 11/04/2023 08:03:44 PM TECHNIQUE: CTA of the head and neck was performed with and  without the administration of intravenous contrast. Noncontrast CT of the head with reconstructed 2-D images are also provided for review. Multiplanar 2D and/or 3D reformatted images are provided for review. Automated exposure control, iterative reconstruction, and/or weight based adjustment of the mA/kV was utilized to reduce the radiation dose to as low as reasonably achievable. COMPARISON: None available CLINICAL HISTORY: Neuro deficit, acute, stroke suspected. Right arm drift; Facial droop. FINDINGS: AORTIC ARCH AND ARCH VESSELS: No dissection or arterial injury. No significant stenosis of the brachiocephalic or subclavian arteries. CERVICAL CAROTID ARTERIES: No dissection, arterial injury, or hemodynamically significant stenosis of the right carotid. Approximately 50% stenosis of the left ICA proximally due to atherosclerosis. CERVICAL VERTEBRAL ARTERIES: Severe right vertebral artery  origin stenosis. Moderate stenosis of the distal right vertebral artery near its dural margin. LUNGS AND MEDIASTINUM: Unremarkable. SOFT TISSUES: No acute abnormality. BONES: No acute abnormality. ANTERIOR CIRCULATION: No significant stenosis of the internal carotid arteries. No significant stenosis of the anterior cerebral arteries. No significant stenosis of the middle cerebral arteries. No aneurysm. POSTERIOR CIRCULATION: No significant stenosis of the posterior cerebral arteries. No significant stenosis of the basilar artery. No significant stenosis of the vertebral arteries. No aneurysm. OTHER: No dural venous sinus thrombosis on this non-dedicated study. IMPRESSION: 1. Approximately 50% stenosis of the left ICA proximally due to atherosclerosis. 2. Severe right vertebral artery origin stenosis and moderate stenosis of the distal right vertebral artery near its dural margin. Electronically signed by: Gilmore Molt MD 11/04/2023 08:26 PM EDT RP Workstation: [id number]   CT HEAD CODE STROKE WO CONTRAST Result Date:  11/04/2023 EXAM: CT HEAD WITHOUT CONTRAST 11/04/2023 08:02:33 PM TECHNIQUE: CT of the head was performed without the administration of intravenous contrast. Automated exposure control, iterative reconstruction, and/or weight based adjustment of the mA/kV was utilized to reduce the radiation dose to as low as reasonably achievable. COMPARISON: None available. CLINICAL HISTORY: Neuro deficit, acute, stroke suspected. No contrast; CT HEAD CODE STROKE WO CONTRAST FINDINGS: BRAIN AND VENTRICLES: No acute hemorrhage. No evidence of acute infarct. No hydrocephalus. No extra-axial collection. No mass effect or midline shift. Patchy white matter hypodensities are compatible with chronic microvascular ischemic change. ORBITS: No acute abnormality. SINUSES: No acute abnormality. SOFT TISSUES AND SKULL: No acute soft tissue abnormality. No skull fracture. IMPRESSION: 1. No acute intracranial abnormality. Findings conveyed to Dr. Vanessa via telephone at 8:09 PM. Electronically signed by: Gilmore Molt MD 11/04/2023 08:10 PM EDT RP Workstation: [id number]     LOS: 1 day   Donalda Applebaum, MD  [facility]    To contact the attending provider between 7A-7P or the covering provider during after hours 7P-7A, please log into the web site [email or web address] and access using universal [place] password for that web site. If you do not have the password, please call the hospital operator.  11/06/2023, 9:01 AM

## 2023-11-07 ENCOUNTER — Other Ambulatory Visit (HOSPITAL_COMMUNITY): Payer: Self-pay

## 2023-11-07 DIAGNOSIS — R531 Weakness: Secondary | ICD-10-CM | POA: Diagnosis not present

## 2023-11-07 DIAGNOSIS — R748 Abnormal levels of other serum enzymes: Secondary | ICD-10-CM | POA: Diagnosis not present

## 2023-11-07 DIAGNOSIS — I639 Cerebral infarction, unspecified: Secondary | ICD-10-CM | POA: Diagnosis not present

## 2023-11-07 DIAGNOSIS — I10 Essential (primary) hypertension: Secondary | ICD-10-CM | POA: Diagnosis not present

## 2023-11-07 LAB — COMPREHENSIVE METABOLIC PANEL WITH GFR
ALT: 77 U/L — ABNORMAL HIGH (ref 0–44)
AST: 33 U/L (ref 15–41)
Albumin: 2.9 g/dL — ABNORMAL LOW (ref 3.5–5.0)
Alkaline Phosphatase: 90 U/L (ref 38–126)
Anion gap: 12 (ref 5–15)
BUN: 11 mg/dL (ref 8–23)
CO2: 22 mmol/L (ref 22–32)
Calcium: 8.5 mg/dL — ABNORMAL LOW (ref 8.9–10.3)
Chloride: 103 mmol/L (ref 98–111)
Creatinine, Ser: 0.85 mg/dL (ref 0.61–1.24)
GFR, Estimated: >60 mL/min (ref >60)
Glucose, Bld: 93 mg/dL (ref 70–99)
Potassium: 3.9 mmol/L (ref 3.5–5.1)
Sodium: 137 mmol/L (ref 135–145)
Total Bilirubin: 0.9 mg/dL (ref 0.0–1.2)
Total Protein: 5.9 g/dL — ABNORMAL LOW (ref 6.5–8.1)

## 2023-11-07 LAB — MAGNESIUM: Magnesium: 2 mg/dL (ref 1.7–2.4)

## 2023-11-07 LAB — PHOSPHORUS: Phosphorus: 2.4 mg/dL — ABNORMAL LOW (ref 2.5–4.6)

## 2023-11-07 MED ORDER — ASPIRIN 81 MG PO CHEW
81.0000 mg | CHEWABLE_TABLET | Freq: Every day | ORAL | 0 refills | Status: AC
  Filled 2023-11-07: qty 21, 21d supply, fill #0

## 2023-11-07 MED ORDER — CLOPIDOGREL BISULFATE 75 MG PO TABS
75.0000 mg | ORAL_TABLET | Freq: Every day | ORAL | 2 refills | Status: AC
  Filled 2023-11-07: qty 90, 90d supply, fill #0

## 2023-11-07 MED ORDER — POTASSIUM PHOSPHATES 15 MMOLE/5ML IV SOLN
15.0000 mmol | Freq: Once | INTRAVENOUS | Status: AC
  Administered 2023-11-07: 15 mmol via INTRAVENOUS
  Filled 2023-11-07: qty 5

## 2023-11-07 NOTE — Progress Notes (Signed)
 Reviewed AVS withj patient all question answered  needs to get IV meds before discharge.

## 2023-11-07 NOTE — TOC Transition Note (Signed)
 Transition of Care [facility]) - Discharge Note   Patient Details  Name: Robert Pacheco MRN: [id number] Date of Birth: Oct 25, 1933  Transition of Care [facility]) CM/SW Contact:  Marval Gell, RN Phone Number: 11/07/2023, 9:34 AM   Clinical Narrative:      [name] w patient at bedside, he declined DME, he requested I call daughter to set up DC plan. Spoke w his daughter [name] and she is to get DME for him and would like OP PT referral sent to [facility], done. Instucted to call to make first appointment, added to AVS   Barriers to Discharge: No Barriers Identified   Patient Goals and CMS Choice Patient states their goals for this hospitalization and ongoing recovery are:: to go home CMS Medicare.gov Compare Post Acute Care list provided to:: Other (Comment Required) Choice offered to / list presented to : Adult Children      Discharge Placement                       Discharge Plan and Services Additional resources added to the After Visit Summary for                                       Social Drivers of Health (SDOH) Interventions SDOH Screenings   Food Insecurity: No Food Insecurity (11/05/2023)  Housing: Low Risk  (11/05/2023)  Transportation Needs: No Transportation Needs (11/05/2023)  Utilities: Not At Risk (11/05/2023)  Alcohol Screen: Low Risk  (03/26/2023)  Depression (PHQ2-9): Low Risk  (09/21/2023)  Financial Resource Strain: Low Risk  (03/26/2023)  Physical Activity: Inactive (03/26/2023)  Social Connections: Socially Integrated (11/05/2023)  Stress: No Stress Concern Present (03/26/2023)  Tobacco Use: Medium Risk (11/05/2023)  Health Literacy: Adequate Health Literacy (03/26/2023)     Readmission Risk Interventions     No data to display

## 2023-11-07 NOTE — Plan of Care (Signed)

## 2023-11-07 NOTE — Discharge Summary (Signed)
 PATIENT DETAILS Name: Robert Pacheco Age: 89 y.o. Sex: male Date of Birth: December 28, 1933 MRN: [id number]. Admitting Physician: Subrina Sundil, MD ERE:Tzwiopwh, [name], DO  Admit Date: 11/04/2023 Discharge date: 11/07/2023  Recommendations for Outpatient Follow-up:  Follow up with PCP in 1-2 weeks Please obtain CMP/CBC in one week Repeat LFTs in 7-10 days-if transaminases are stable/normalized-should be okay to rechallenge with statin and see what happens.  Defer further to PCP.  Admitted From:  Home  Disposition: Home   Discharge Condition: good  CODE STATUS:   Code Status: Full Code   Diet recommendation:  Diet Order             Diet - low sodium heart healthy           Diet Heart Room service appropriate? Yes; Fluid consistency: Thin  Diet effective now                    Brief Summary: Patient is a 89 y.o.  male with history of CAD s/p CABG, HTN, PSVT (seen on Zio patch 2022), HLD-who presented with acute ischemic CVA and transaminitis.   Significant events: 10/9>> admit to [facility].   Significant studies: 10/8>> CTA head/neck: 50% stenosis left ICA, severe right vertebral artery origin stenosis. 10/8>> CT abdomen/pelvis: No acute abnormalities. 10/8>> RUQ ultrasound: Hepatic steatosis. 10/9>> MRI brain: Acute perforator infarct left basal ganglia. 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   Left carotid artery stenosis (50% stenosis by CTA) Continue  DAPT/statin Outpatient follow-up with vascular surgery Not felt to be symptomatic currently-as CVA is from small vessel disease.   Transaminitis Unclear etiology Statin held-transaminases have downtrended significantly Acute hepatitis serology/EBV/CMV PCR negative Hold statin on discharge-repeat LFTs in 1 week-if still downtrending-should be okay to resume statins and see what happens.  Defer further to PCP.  Low-grade fever Occurred on 10/9-none since then. No further chills overnight All cultures negative Monitor off antibiotics.   HLD Statin on hold due to transaminitis.-See above regarding resumption plan.   HTN BP stable Resume beta-blocker on discharge-permissive hypertension allowed initially.   History of PSVT/PACs/PVC (Zio patch 2022) Telemetry monitoring Resume beta-blocker on discharge.   Discharge Diagnoses:  Principal Problem:   Right sided weakness Active Problems:   Hyperlipidemia   Essential hypertension   History of TIA (transient ischemic attack)   Facial droop   History of atrial tachycardia   History of CAD (coronary artery disease)   Acute CVA (cerebrovascular accident) (HCC)   Elevated liver enzymes   Transaminitis   Discharge Instructions:  Activity:  As tolerated    Discharge Instructions     Ambulatory referral to Neurology   Complete by: As directed    An appointment is requested in approximately: 4 weeks   Diet - low sodium heart healthy   Complete by: As directed    Discharge instructions   Complete by: As directed    Follow with Primary MD  [name], DO in 1-2 weeks  Please get a complete blood count and chemistry panel checked by your Primary  MD at your next visit, and again as instructed by your Primary MD.  Get Medicines reviewed and adjusted: Please take all your medications with you for your next visit with your Primary MD  Laboratory/radiological data: Please request your Primary MD to go over all  hospital tests and procedure/radiological results at the follow up, please ask your Primary MD to get all Hospital records sent to his/her office.  In some cases, they will be blood work, cultures and biopsy results pending at the time of your discharge. Please request that your primary care M.D. follows up on these results.  Also Note the following: If you experience worsening of your admission symptoms, develop shortness of breath, life threatening emergency, suicidal or homicidal thoughts you must seek medical attention immediately by calling 911 or calling your MD immediately  if symptoms less severe.  You must read complete instructions/literature along with all the possible adverse reactions/side effects for all the Medicines you take and that have been prescribed to you. Take any new Medicines after you have completely understood and accpet all the possible adverse reactions/side effects.   Do not drive when taking Pain medications or sleeping medications (Benzodaizepines)  Do not take more than prescribed Pain, Sleep and Anxiety Medications. It is not advisable to combine anxiety,sleep and pain medications without talking with your primary care practitioner  Special Instructions: If you have smoked or chewed Tobacco  in the last 2 yrs please stop smoking, stop any regular Alcohol  and or any Recreational drug use.  Wear Seat belts while driving.  Please note: You were cared for by a hospitalist during your hospital stay. Once you are discharged, your primary care physician will handle any further medical issues. Please note that NO REFILLS for any discharge medications will be authorized once you are discharged, as it is imperative that you return to your primary care physician (or establish a relationship with a primary care physician if you do not have one) for your post hospital discharge needs so that they can reassess your need for medications and monitor your lab values.   Increase  activity slowly   Complete by: As directed       Allergies as of 11/07/2023       Reactions   Other Nausea Only   UNKNOWN PAIN MED        Medication List     PAUSE taking these medications    simvastatin  20 MG tablet Wait to take this until your doctor or other care provider tells you to start again. Commonly known as: ZOCOR  Take 1 tablet (20 mg total) by mouth daily at 6 PM.       TAKE these medications    acetaminophen  500 MG tablet Commonly known as: TYLENOL  Take 500 mg by mouth daily as needed for mild pain (pain score 1-3).   aspirin  81 MG chewable tablet Chew 1 tablet (81 mg total) by mouth daily.   CLARITIN PO Take 1 tablet by mouth as needed.   clopidogrel 75 MG tablet Commonly known as: PLAVIX Take 1 tablet (75 mg total) by mouth daily. Start taking on: November 08, 2023   Fish Oil 1000 MG Caps Take 1 capsule by mouth in the morning.   meloxicam  7.5 MG tablet Commonly known as: MOBIC  Take 1 tablet (7.5 mg total) by mouth daily.   metoprolol  succinate 25 MG 24 hr tablet Commonly known as: TOPROL -XL Take 0.5 tablets (12.5 mg total) by mouth at bedtime.   Nitrostat  0.4  MG SL tablet Generic drug: nitroGLYCERIN  DISSOLVE ONE TABLET UNDER THE TONGUE EVERY 5 MINUTES AS NEEDED.   polyethylene glycol 17 g packet Commonly known as: MIRALAX / GLYCOLAX Take 17 g by mouth daily as needed for mild constipation.   psyllium 95 % Pack Commonly known as: HYDROCIL/METAMUCIL Take 1 packet by mouth daily.        Follow-up Information     [name], DO. Schedule an appointment as soon as possible for a visit in 1 week(s).   Specialty: Family Medicine Contact information: [address] [place] [address] [phone]         [facility] Follow up.   Specialty: Neurology Why: Office will call with date/time, If you dont hear from them,please give them a call, Hospital follow up Contact  information: [address] [place]  [address] [phone]               Allergies  Allergen Reactions   Other Nausea Only    UNKNOWN PAIN MED     Other Procedures/Studies: ECHOCARDIOGRAM COMPLETE Result Date: 11/05/2023    ECHOCARDIOGRAM REPORT   Patient Name:   Robert Pacheco Date of Exam: 11/05/2023 Medical Rec #:  [id number]     Height:       68.4 in Accession #:    [id number]    Weight:       182.5 lb Date of Birth:  10/17/1933     BSA:          1.974 m Patient Age:    89 years      BP:           149/77 mmHg Patient Gender: M             HR:           70 bpm. Exam Location:  Inpatient Procedure: 2D Echo (Both Spectral and Color Flow Doppler were utilized during            procedure). Indications:    Stroke  History:        Patient has prior history of Echocardiogram examinations. Risk                 Factors:Hypertension.  Sonographer:    Charmaine Gaskins Referring Phys: [id number] SUBRINA SUNDIL IMPRESSIONS  1. Left ventricular ejection fraction, by estimation, is 60 to 65%. The left ventricle has normal function. The left ventricle has no regional wall motion abnormalities. Left ventricular diastolic parameters are consistent with Grade I diastolic dysfunction (impaired relaxation).  2. Right ventricular systolic function is normal. The right ventricular size is normal. There is normal pulmonary artery systolic pressure.  3. Left atrial size was mildly dilated.  4. The mitral valve is normal in structure. Trivial mitral valve regurgitation. No evidence of mitral stenosis.  5. The aortic valve is tricuspid. There is mild calcification of the aortic valve. Aortic valve regurgitation is not visualized. Aortic valve sclerosis/calcification is present, without any evidence of aortic stenosis.  6. The inferior vena cava is normal in size with greater than 50% respiratory variability, suggesting right atrial pressure of 3 mmHg. FINDINGS  Left Ventricle: Left ventricular  ejection fraction, by estimation, is 60 to 65%. The left ventricle has normal function. The left ventricle has no regional wall motion abnormalities. The left ventricular internal cavity size was normal in size. There is  no left ventricular hypertrophy. Left ventricular diastolic parameters are consistent with  Grade I diastolic dysfunction (impaired relaxation). Right Ventricle: The right ventricular size is normal. No increase in right ventricular wall thickness. Right ventricular systolic function is normal. There is normal pulmonary artery systolic pressure. The tricuspid regurgitant velocity is 2.58 m/s, and  with an assumed right atrial pressure of 3 mmHg, the estimated right ventricular systolic pressure is 29.6 mmHg. Left Atrium: Left atrial size was mildly dilated. Right Atrium: Right atrial size was normal in size. Pericardium: There is no evidence of pericardial effusion. Mitral Valve: The mitral valve is normal in structure. Trivial mitral valve regurgitation. No evidence of mitral valve stenosis. Tricuspid Valve: The tricuspid valve is normal in structure. Tricuspid valve regurgitation is mild . No evidence of tricuspid stenosis. Aortic Valve: The aortic valve is tricuspid. There is mild calcification of the aortic valve. Aortic valve regurgitation is not visualized. Aortic valve sclerosis/calcification is present, without any evidence of aortic stenosis. Pulmonic Valve: The pulmonic valve was normal in structure. Pulmonic valve regurgitation is trivial. No evidence of pulmonic stenosis. Aorta: The aortic root is normal in size and structure. Venous: The inferior vena cava is normal in size with greater than 50% respiratory variability, suggesting right atrial pressure of 3 mmHg. IAS/Shunts: No atrial level shunt detected by color flow Doppler.  LEFT VENTRICLE PLAX 2D LVIDd:         4.00 cm   Diastology LVIDs:         2.50 cm   LV e' medial:    8.49 cm/s LV PW:         0.90 cm   LV E/e' medial:  9.8 LV  IVS:        0.90 cm   LV e' lateral:   9.36 cm/s LVOT diam:     2.00 cm   LV E/e' lateral: 8.9 LVOT Area:     3.14 cm  RIGHT VENTRICLE RV Basal diam:  2.80 cm RV Mid diam:    2.80 cm RV S prime:     12.80 cm/s LEFT ATRIUM             Index        RIGHT ATRIUM           Index LA diam:        3.00 cm 1.52 cm/m   RA Area:     12.50 cm LA Vol (A2C):   52.5 ml 26.59 ml/m  RA Volume:   24.60 ml  12.46 ml/m LA Vol (A4C):   69.6 ml 35.26 ml/m LA Biplane Vol: 61.1 ml 30.95 ml/m   AORTA Ao Root diam: 3.20 cm Ao Asc diam:  3.30 cm MITRAL VALVE                TRICUSPID VALVE MV Area (PHT): 3.89 cm     TR Peak grad:   26.6 mmHg MV Decel Time: 195 msec     TR Vmax:        258.00 cm/s MV E velocity: 83.30 cm/s MV A velocity: 114.00 cm/s  SHUNTS MV E/A ratio:  0.73         Systemic Diam: 2.00 cm Toribio Fuel MD Electronically signed by Toribio Fuel MD Signature Date/Time: 11/05/2023/3:30:13 PM    Final    MR BRAIN WO CONTRAST Result Date: 11/05/2023 EXAM: MR Brain without Intravenous Contrast. CLINICAL HISTORY: Acute CVA. TECHNIQUE: Magnetic resonance images of the brain without intravenous contrast in multiple planes. CONTRAST: Without. COMPARISON: MRI head Jun 08, 2023 FINDINGS: BRAIN: Small acute perforator  infarct in the left basal ganglia. Moderate T2/FLAIR hyperintensities in the white matter, compatible with chronic microvascular ischemic change. No intracranial mass or hemorrhage. No midline shift or extra-axial fluid collection. No cerebellar tonsillar ectopia. The central arterial and venous flow voids are patent. VENTRICLES: No hydrocephalus. ORBITS: The orbits are normal. SINUSES AND MASTOIDS: The sinuses and mastoid air cells are clear. BONES: No acute fracture or focal osseous lesion. IMPRESSION: 1. Small acute perforator infarct in the left basal ganglia. Electronically signed by: Gilmore Molt MD 11/05/2023 03:15 AM EDT RP Workstation: [id number]   CT ABDOMEN PELVIS WO CONTRAST Result Date:  11/04/2023 CLINICAL DATA:  Acute abdominal pain EXAM: CT ABDOMEN AND PELVIS WITHOUT CONTRAST TECHNIQUE: Multidetector CT imaging of the abdomen and pelvis was performed following the standard protocol without IV contrast. RADIATION DOSE REDUCTION: This exam was performed according to the departmental dose-optimization program which includes automated exposure control, adjustment of the mA and/or kV according to patient size and/or use of iterative reconstruction technique. COMPARISON:  06/08/2023 FINDINGS: Lower chest: No acute abnormality. Hepatobiliary: Liver again demonstrates multiple cysts similar to that seen on the prior exam. Gallbladder demonstrates a normal appearance. No definitive hepatic mass is seen. Pancreas: Unremarkable. No pancreatic ductal dilatation or surrounding inflammatory changes. Spleen: Normal in size without focal abnormality. Adrenals/Urinary Tract: Adrenal glands are within normal limits. 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 Evaluation for calculi is limited due to contrast enhancement from prior CT of head. No obstructive changes are seen. The bladder is well distended with opacified urine. Indentation upon the inferior aspect of the bladder related to the prostate is noted. Stomach/Bowel: No obstructive or inflammatory changes of colon are noted. Mild fecal material is seen throughout the colon. The appendix is within normal limits. Small bowel and stomach are unremarkable. Vascular/Lymphatic: Aortic atherosclerosis. No enlarged abdominal or pelvic lymph nodes. Reproductive: Prostate is enlarged in size. Other: No abdominal wall hernia or abnormality. No abdominopelvic ascites. Musculoskeletal: No acute or significant osseous findings. IMPRESSION: Chronic changes as described above.  No acute abnormality is noted. Electronically Signed   By: Oneil Devonshire M.D.   On: 11/04/2023 23:52   US  Abdomen Limited RUQ (LIVER/GB) Result Date:  11/04/2023 CLINICAL DATA:  [id number] Transaminitis [id number] EXAM: ULTRASOUND ABDOMEN LIMITED RIGHT UPPER QUADRANT COMPARISON:  CT abdomen pelvis 11/04/2023 FINDINGS: Gallbladder: No gallstones or wall thickening visualized. No sonographic Murphy sign noted by sonographer. Common bile duct: Diameter: 3mm Liver: Multiple simple hepatic cysts. Increased parenchymal echogenicity. Portal vein is patent on color Doppler imaging with normal direction of blood flow towards the liver. Other: None. IMPRESSION: Hepatic steatosis. Please note limited evaluation for focal hepatic masses in a patient with hepatic steatosis due to decreased penetration of the acoustic ultrasound waves. Electronically Signed   By: Morgane  Naveau M.D.   On: 11/04/2023 23:21   CT ANGIO HEAD NECK W WO CM (CODE STROKE) Result Date: 11/04/2023 EXAM: CT HEAD WITHOUT CTA HEAD AND NECK WITH AND WITHOUT 11/04/2023 08:03:44 PM TECHNIQUE: CTA of the head and neck was performed with and without the administration of intravenous contrast. Noncontrast CT of the head with reconstructed 2-D images are also provided for review. Multiplanar 2D and/or 3D reformatted images are provided for review. Automated exposure control, iterative reconstruction, and/or weight based adjustment of the mA/kV was utilized to reduce the radiation dose to as low as reasonably achievable. COMPARISON: None available CLINICAL HISTORY: Neuro deficit, acute, stroke suspected. Right arm drift; Facial droop. FINDINGS: AORTIC  ARCH AND ARCH VESSELS: No dissection or arterial injury. No significant stenosis of the brachiocephalic or subclavian arteries. CERVICAL CAROTID ARTERIES: No dissection, arterial injury, or hemodynamically significant stenosis of the right carotid. Approximately 50% stenosis of the left ICA proximally due to atherosclerosis. CERVICAL VERTEBRAL ARTERIES: Severe right vertebral artery origin stenosis. Moderate stenosis of the distal right vertebral artery near its dural  margin. LUNGS AND MEDIASTINUM: Unremarkable. SOFT TISSUES: No acute abnormality. BONES: No acute abnormality. ANTERIOR CIRCULATION: No significant stenosis of the internal carotid arteries. No significant stenosis of the anterior cerebral arteries. No significant stenosis of the middle cerebral arteries. No aneurysm. POSTERIOR CIRCULATION: No significant stenosis of the posterior cerebral arteries. No significant stenosis of the basilar artery. No significant stenosis of the vertebral arteries. No aneurysm. OTHER: No dural venous sinus thrombosis on this non-dedicated study. IMPRESSION: 1. Approximately 50% stenosis of the left ICA proximally due to atherosclerosis. 2. Severe right vertebral artery origin stenosis and moderate stenosis of the distal right vertebral artery near its dural margin. Electronically signed by: Gilmore Molt MD 11/04/2023 08:26 PM EDT RP Workstation: [id number]   CT HEAD CODE STROKE WO CONTRAST Result Date: 11/04/2023 EXAM: CT HEAD WITHOUT CONTRAST 11/04/2023 08:02:33 PM TECHNIQUE: CT of the head was performed without the administration of intravenous contrast. Automated exposure control, iterative reconstruction, and/or weight based adjustment of the mA/kV was utilized to reduce the radiation dose to as low as reasonably achievable. COMPARISON: None available. CLINICAL HISTORY: Neuro deficit, acute, stroke suspected. No contrast; CT HEAD CODE STROKE WO CONTRAST FINDINGS: BRAIN AND VENTRICLES: No acute hemorrhage. No evidence of acute infarct. No hydrocephalus. No extra-axial collection. No mass effect or midline shift. Patchy white matter hypodensities are compatible with chronic microvascular ischemic change. ORBITS: No acute abnormality. SINUSES: No acute abnormality. SOFT TISSUES AND SKULL: No acute soft tissue abnormality. No skull fracture. IMPRESSION: 1. No acute intracranial abnormality. Findings conveyed to Dr. Vanessa via telephone at 8:09 PM. Electronically signed by:  Gilmore Molt MD 11/04/2023 08:10 PM EDT RP Workstation: [id number]     TODAY-DAY OF DISCHARGE:  Subjective:   Robert Pacheco today has no headache,no chest abdominal pain,no new weakness tingling or numbness, feels much better wants to go home today.   Objective:   Blood pressure (!) 147/81, pulse 68, temperature 98.4 F (36.9 C), temperature source Oral, resp. rate 20, height 5' 8.4 (1.737 m), weight 82.8 kg, SpO2 96%.  Intake/Output Summary (Last 24 hours) at 11/07/2023 0903 Last data filed at 11/06/2023 1639 Gross per 24 hour  Intake 111.75 ml  Output --  Net 111.75 ml   Filed Weights   11/04/23 1900 11/04/23 2021  Weight: 82.8 kg 82.8 kg    Exam: Awake Alert, Oriented *3, No new F.N deficits, Normal affect [place].AT,PERRAL Supple Neck,No JVD, No cervical lymphadenopathy appriciated.  Symmetrical Chest wall movement, Good air movement bilaterally, CTAB RRR,No Gallops,Rubs or new Murmurs, No Parasternal Heave +ve B.Sounds, Abd Soft, Non tender, No organomegaly appriciated, No rebound -guarding or rigidity. No Cyanosis, Clubbing or edema, No new Rash or bruise   PERTINENT RADIOLOGIC STUDIES: ECHOCARDIOGRAM COMPLETE Result Date: 11/05/2023    ECHOCARDIOGRAM REPORT   Patient Name:   Robert Pacheco Date of Exam: 11/05/2023 Medical Rec #:  [id number]     Height:       68.4 in Accession #:    [id number]    Weight:       182.5 lb Date of Birth:  November 05, 1933     BSA:  1.974 m Patient Age:    89 years      BP:           149/77 mmHg Patient Gender: M             HR:           70 bpm. Exam Location:  Inpatient Procedure: 2D Echo (Both Spectral and Color Flow Doppler were utilized during            procedure). Indications:    Stroke  History:        Patient has prior history of Echocardiogram examinations. Risk                 Factors:Hypertension.  Sonographer:    Charmaine Gaskins Referring Phys: [id number] SUBRINA SUNDIL IMPRESSIONS  1. Left ventricular ejection fraction, by estimation, is  60 to 65%. The left ventricle has normal function. The left ventricle has no regional wall motion abnormalities. Left ventricular diastolic parameters are consistent with Grade I diastolic dysfunction (impaired relaxation).  2. Right ventricular systolic function is normal. The right ventricular size is normal. There is normal pulmonary artery systolic pressure.  3. Left atrial size was mildly dilated.  4. The mitral valve is normal in structure. Trivial mitral valve regurgitation. No evidence of mitral stenosis.  5. The aortic valve is tricuspid. There is mild calcification of the aortic valve. Aortic valve regurgitation is not visualized. Aortic valve sclerosis/calcification is present, without any evidence of aortic stenosis.  6. The inferior vena cava is normal in size with greater than 50% respiratory variability, suggesting right atrial pressure of 3 mmHg. FINDINGS  Left Ventricle: Left ventricular ejection fraction, by estimation, is 60 to 65%. The left ventricle has normal function. The left ventricle has no regional wall motion abnormalities. The left ventricular internal cavity size was normal in size. There is  no left ventricular hypertrophy. Left ventricular diastolic parameters are consistent with Grade I diastolic dysfunction (impaired relaxation). Right Ventricle: The right ventricular size is normal. No increase in right ventricular wall thickness. Right ventricular systolic function is normal. There is normal pulmonary artery systolic pressure. The tricuspid regurgitant velocity is 2.58 m/s, and  with an assumed right atrial pressure of 3 mmHg, the estimated right ventricular systolic pressure is 29.6 mmHg. Left Atrium: Left atrial size was mildly dilated. Right Atrium: Right atrial size was normal in size. Pericardium: There is no evidence of pericardial effusion. Mitral Valve: The mitral valve is normal in structure. Trivial mitral valve regurgitation. No evidence of mitral valve stenosis.  Tricuspid Valve: The tricuspid valve is normal in structure. Tricuspid valve regurgitation is mild . No evidence of tricuspid stenosis. Aortic Valve: The aortic valve is tricuspid. There is mild calcification of the aortic valve. Aortic valve regurgitation is not visualized. Aortic valve sclerosis/calcification is present, without any evidence of aortic stenosis. Pulmonic Valve: The pulmonic valve was normal in structure. Pulmonic valve regurgitation is trivial. No evidence of pulmonic stenosis. Aorta: The aortic root is normal in size and structure. Venous: The inferior vena cava is normal in size with greater than 50% respiratory variability, suggesting right atrial pressure of 3 mmHg. IAS/Shunts: No atrial level shunt detected by color flow Doppler.  LEFT VENTRICLE PLAX 2D LVIDd:         4.00 cm   Diastology LVIDs:         2.50 cm   LV e' medial:    8.49 cm/s LV PW:  0.90 cm   LV E/e' medial:  9.8 LV IVS:        0.90 cm   LV e' lateral:   9.36 cm/s LVOT diam:     2.00 cm   LV E/e' lateral: 8.9 LVOT Area:     3.14 cm  RIGHT VENTRICLE RV Basal diam:  2.80 cm RV Mid diam:    2.80 cm RV S prime:     12.80 cm/s LEFT ATRIUM             Index        RIGHT ATRIUM           Index LA diam:        3.00 cm 1.52 cm/m   RA Area:     12.50 cm LA Vol (A2C):   52.5 ml 26.59 ml/m  RA Volume:   24.60 ml  12.46 ml/m LA Vol (A4C):   69.6 ml 35.26 ml/m LA Biplane Vol: 61.1 ml 30.95 ml/m   AORTA Ao Root diam: 3.20 cm Ao Asc diam:  3.30 cm MITRAL VALVE                TRICUSPID VALVE MV Area (PHT): 3.89 cm     TR Peak grad:   26.6 mmHg MV Decel Time: 195 msec     TR Vmax:        258.00 cm/s MV E velocity: 83.30 cm/s MV A velocity: 114.00 cm/s  SHUNTS MV E/A ratio:  0.73         Systemic Diam: 2.00 cm Toribio Fuel MD Electronically signed by Toribio Fuel MD Signature Date/Time: 11/05/2023/3:30:13 PM    Final      PERTINENT LAB RESULTS: CBC: Recent Labs    11/05/23 0340 11/06/23 0302  WBC 10.0 6.0  HGB 13.4  12.9*  HCT 40.0 39.1  PLT 183 145*   CMET CMP     Component Value Date/Time   NA 137 11/07/2023 0447   NA 143 07/11/2020 1444   K 3.9 11/07/2023 0447   CL 103 11/07/2023 0447   CO2 22 11/07/2023 0447   GLUCOSE 93 11/07/2023 0447   BUN 11 11/07/2023 0447   BUN 18 07/11/2020 1444   CREATININE 0.85 11/07/2023 0447   CREATININE 1.03 10/25/2019 0816   CALCIUM 8.5 (L) 11/07/2023 0447   PROT 5.9 (L) 11/07/2023 0447   ALBUMIN 2.9 (L) 11/07/2023 0447   AST 33 11/07/2023 0447   ALT 77 (H) 11/07/2023 0447   ALKPHOS 90 11/07/2023 0447   BILITOT 0.9 11/07/2023 0447   GFR 66.44 10/12/2023 1040   EGFR 78 07/11/2020 1444   GFRNONAA >60 11/07/2023 0447    GFR Estimated Creatinine Clearance: 58.9 mL/min (by C-G formula based on SCr of 0.85 mg/dL). Recent Labs    11/04/23 0942  LIPASE 52*   No results for input(s): CKTOTAL, CKMB, CKMBINDEX, TROPONINI in the last 72 hours. Invalid input(s): POCBNP No results for input(s): DDIMER in the last 72 hours. Recent Labs    11/05/23 0340  HGBA1C 5.3   Recent Labs    11/05/23 0340  CHOL 91  HDL 39*  LDLCALC 42  TRIG 48  CHOLHDL 2.3   No results for input(s): TSH, T4TOTAL, T3FREE, THYROIDAB in the last 72 hours.  Invalid input(s): FREET3 No results for input(s): VITAMINB12, FOLATE, FERRITIN, TIBC, IRON, RETICCTPCT in the last 72 hours. Coags: Recent Labs    11/04/23 1957  INR 1.0   Microbiology: Recent Results (from the past 240 hours)  Resp panel by RT-PCR (RSV, Flu A&B, Covid) Anterior Nasal Swab     Status: None   Collection Time: 11/05/23 11:14 AM   Specimen: Anterior Nasal Swab  Result Value Ref Range Status   SARS Coronavirus 2 by RT PCR NEGATIVE NEGATIVE Final   Influenza A by PCR NEGATIVE NEGATIVE Final   Influenza B by PCR NEGATIVE NEGATIVE Final    Comment: (NOTE) The Xpert Xpress SARS-CoV-2/FLU/RSV plus assay is intended as an aid in the diagnosis of influenza from  Nasopharyngeal swab specimens and should not be used as a sole basis for treatment. Nasal washings and aspirates are unacceptable for Xpert Xpress SARS-CoV-2/FLU/RSV testing.  Fact Sheet for Patients: BloggerCourse.com  Fact Sheet for Healthcare Providers: SeriousBroker.it  This test is not yet approved or cleared by the [place]  FDA and has been authorized for detection and/or diagnosis of SARS-CoV-2 by FDA under an Emergency Use Authorization (EUA). This EUA will remain in effect (meaning this test can be used) for the duration of the COVID-19 declaration under Section 564(b)(1) of the Act, 21 U.S.C. section 360bbb-3(b)(1), unless the authorization is terminated or revoked.     Resp Syncytial Virus by PCR NEGATIVE NEGATIVE Final    Comment: (NOTE) Fact Sheet for Patients: BloggerCourse.com  Fact Sheet for Healthcare Providers: SeriousBroker.it  This test is not yet approved or cleared by the [place]  FDA and has been authorized for detection and/or diagnosis of SARS-CoV-2 by FDA under an Emergency Use Authorization (EUA). This EUA will remain in effect (meaning this test can be used) for the duration of the COVID-19 declaration under Section 564(b)(1) of the Act, 21 U.S.C. section 360bbb-3(b)(1), unless the authorization is terminated or revoked.  Performed at [facility], [address], [place], [place] [address]   Culture, blood (Routine X 2) w Reflex to ID Panel     Status: None (Preliminary result)   Collection Time: 11/05/23  6:51 PM   Specimen: BLOOD RIGHT ARM  Result Value Ref Range Status   Specimen Description BLOOD RIGHT ARM  Final   Special Requests   Final    BOTTLES DRAWN AEROBIC AND ANAEROBIC Blood Culture adequate volume   Culture   Final    NO GROWTH 2 DAYS Performed at [facility], [address] [place], [place] [address]     Report Status PENDING  Incomplete  Culture, blood (Routine X 2) w Reflex to ID Panel     Status: None (Preliminary result)   Collection Time: 11/05/23  6:57 PM   Specimen: BLOOD RIGHT HAND  Result Value Ref Range Status   Specimen Description BLOOD RIGHT HAND  Final   Special Requests   Final    BOTTLES DRAWN AEROBIC AND ANAEROBIC Blood Culture adequate volume   Culture   Final    NO GROWTH 2 DAYS Performed at [facility], [address], [place], [place] [address]    Report Status PENDING  Incomplete  Respiratory (~20 pathogens) panel by PCR     Status: None   Collection Time: 11/06/23  2:05 AM   Specimen: Nasopharyngeal Swab; Respiratory  Result Value Ref Range Status   Adenovirus NOT DETECTED NOT DETECTED Final   Coronavirus 229E NOT DETECTED NOT DETECTED Final    Comment: (NOTE) The Coronavirus on the Respiratory Panel, DOES NOT test for the novel  Coronavirus (2019 nCoV)    Coronavirus HKU1 NOT DETECTED NOT DETECTED Final   Coronavirus NL63 NOT DETECTED NOT DETECTED Final  Coronavirus OC43 NOT DETECTED NOT DETECTED Final   Metapneumovirus NOT DETECTED NOT DETECTED Final   Rhinovirus / Enterovirus NOT DETECTED NOT DETECTED Final   Influenza A NOT DETECTED NOT DETECTED Final   Influenza B NOT DETECTED NOT DETECTED Final   Parainfluenza Virus 1 NOT DETECTED NOT DETECTED Final   Parainfluenza Virus 2 NOT DETECTED NOT DETECTED Final   Parainfluenza Virus 3 NOT DETECTED NOT DETECTED Final   Parainfluenza Virus 4 NOT DETECTED NOT DETECTED Final   Respiratory Syncytial Virus NOT DETECTED NOT DETECTED Final   Bordetella pertussis NOT DETECTED NOT DETECTED Final   Bordetella Parapertussis NOT DETECTED NOT DETECTED Final   Chlamydophila pneumoniae NOT DETECTED NOT DETECTED Final   Mycoplasma pneumoniae NOT DETECTED NOT DETECTED Final    Comment: Performed at [facility], [address], [place], [place] [address]    FURTHER DISCHARGE INSTRUCTIONS:  Get Medicines  reviewed and adjusted: Please take all your medications with you for your next visit with your Primary MD  Laboratory/radiological data: Please request your Primary MD to go over all hospital tests and procedure/radiological results at the follow up, please ask your Primary MD to get all Hospital records sent to his/her office.  In some cases, they will be blood work, cultures and biopsy results pending at the time of your discharge. Please request that your primary care M.D. goes through all the records of your hospital data and follows up on these results.  Also Note the following: If you experience worsening of your admission symptoms, develop shortness of breath, life threatening emergency, suicidal or homicidal thoughts you must seek medical attention immediately by calling 911 or calling your MD immediately  if symptoms less severe.  You must read complete instructions/literature along with all the possible adverse reactions/side effects for all the Medicines you take and that have been prescribed to you. Take any new Medicines after you have completely understood and accpet all the possible adverse reactions/side effects.   Do not drive when taking Pain medications or sleeping medications (Benzodaizepines)  Do not take more than prescribed Pain, Sleep and Anxiety Medications. It is not advisable to combine anxiety,sleep and pain medications without talking with your primary care practitioner  Special Instructions: If you have smoked or chewed Tobacco  in the last 2 yrs please stop smoking, stop any regular Alcohol  and or any Recreational drug use.  Wear Seat belts while driving.  Please note: You were cared for by a hospitalist during your hospital stay. Once you are discharged, your primary care physician will handle any further medical issues. Please note that NO REFILLS for any discharge medications will be authorized once you are discharged, as it is imperative that you return to  your primary care physician (or establish a relationship with a primary care physician if you do not have one) for your post hospital discharge needs so that they can reassess your need for medications and monitor your lab values.  Total Time spent coordinating discharge including counseling, education and face to face time equals greater than 30 minutes.  Signed: Alakai Macbride 11/07/2023 9:03 AM

## 2023-11-09 ENCOUNTER — Telehealth: Payer: Self-pay | Admitting: Neurology

## 2023-11-09 ENCOUNTER — Telehealth: Payer: Self-pay

## 2023-11-09 NOTE — Telephone Encounter (Signed)
 PT.s wife is asking for ED followup

## 2023-11-09 NOTE — Telephone Encounter (Signed)
 Copied from CRM [id number]. Topic: General - Other >> Nov 09, 2023  3:21 PM Deaijah H wrote: Reason for CRM: Patients daughter called in stating discharge papers state to get a complete Blood count and chemistry panel and to go over all hospital test/results and to have Dr. Frann have all record sent to Dr. Frann.  Pt has appt scheduled for 11/11/2023

## 2023-11-09 NOTE — Transitions of Care (Post Inpatient/ED Visit) (Signed)
   11/09/2023  Name: Robert Pacheco MRN: [id number] DOB: 1933/11/02  Today's TOC FU Call Status: Today's TOC FU Call Status:: Successful TOC FU Call Completed TOC FU Call Complete Date: 11/09/23 Patient's Name and Date of Birth confirmed.  Transition Care Management Follow-up Telephone Call Date of Discharge: 11/07/23 Discharge Facility: [facility]) Type of Discharge: Inpatient Admission Primary Inpatient Discharge Diagnosis:: cerebral infarction How have you been since you were released from the hospital?: Better Any questions or concerns?: No  Items Reviewed: Did you receive and understand the discharge instructions provided?: Yes Medications obtained,verified, and reconciled?: Yes (Medications Reviewed) Any new allergies since your discharge?: No Dietary orders reviewed?: Yes Do you have support at home?: Yes People in Home [RPT]: child(ren), adult  Medications Reviewed Today: Medications Reviewed Today   Medications were not reviewed in this encounter     Home Care and Equipment/Supplies: Were Home Health Services Ordered?: Yes Name of Home Health Agency:: unknown Has Agency set up a time to come to your home?: No Any new equipment or medical supplies ordered?: NA  Functional Questionnaire: Do you need assistance with bathing/showering or dressing?: No Do you need assistance with meal preparation?: No Do you need assistance with eating?: No Do you have difficulty maintaining continence: No Do you need assistance with getting out of bed/getting out of a chair/moving?: No Do you have difficulty managing or taking your medications?: Yes  Follow up appointments reviewed: PCP Follow-up appointment confirmed?: Yes Date of PCP follow-up appointment?: 11/11/23 Follow-up Provider: [facility] Hospital Follow-up appointment confirmed?: No Reason Specialist Follow-Up Not Confirmed: Patient has Specialist Provider Number and will Call for Appointment Do you need  transportation to your follow-up appointment?: No Do you understand care options if your condition(s) worsen?: Yes-patient verbalized understanding    SIGNATURE Julian Lemmings, LPN [facility] Nurse Health Advisor Direct Dial [phone]

## 2023-11-10 ENCOUNTER — Ambulatory Visit: Admitting: Physical Therapy

## 2023-11-10 LAB — CULTURE, BLOOD (ROUTINE X 2)
Culture: NO GROWTH
Culture: NO GROWTH
Special Requests: ADEQUATE
Special Requests: ADEQUATE

## 2023-11-11 ENCOUNTER — Encounter: Payer: Self-pay | Admitting: Family Medicine

## 2023-11-11 ENCOUNTER — Telehealth: Payer: Self-pay

## 2023-11-11 ENCOUNTER — Ambulatory Visit: Payer: Self-pay | Admitting: Family Medicine

## 2023-11-11 ENCOUNTER — Ambulatory Visit: Admitting: Family Medicine

## 2023-11-11 VITALS — BP 136/70 | HR 80 | Temp 98.0°F | Resp 16 | Ht 68.0 in | Wt 182.0 lb

## 2023-11-11 DIAGNOSIS — I679 Cerebrovascular disease, unspecified: Secondary | ICD-10-CM | POA: Diagnosis not present

## 2023-11-11 DIAGNOSIS — R7401 Elevation of levels of liver transaminase levels: Secondary | ICD-10-CM | POA: Diagnosis not present

## 2023-11-11 DIAGNOSIS — I639 Cerebral infarction, unspecified: Secondary | ICD-10-CM

## 2023-11-11 DIAGNOSIS — I25701 Atherosclerosis of coronary artery bypass graft(s), unspecified, with angina pectoris with documented spasm: Secondary | ICD-10-CM

## 2023-11-11 LAB — COMPREHENSIVE METABOLIC PANEL WITH GFR
ALT: 30 U/L (ref 0–53)
AST: 16 U/L (ref 0–37)
Albumin: 4.1 g/dL (ref 3.5–5.2)
Alkaline Phosphatase: 91 U/L (ref 39–117)
BUN: 9 mg/dL (ref 6–23)
CO2: 28 meq/L (ref 19–32)
Calcium: 8.8 mg/dL (ref 8.4–10.5)
Chloride: 104 meq/L (ref 96–112)
Creatinine, Ser: 0.93 mg/dL (ref 0.40–1.50)
GFR: 73.32 mL/min (ref >60.00)
Glucose, Bld: 90 mg/dL (ref 70–99)
Potassium: 4.2 meq/L (ref 3.5–5.1)
Sodium: 140 meq/L (ref 135–145)
Total Bilirubin: 0.5 mg/dL (ref 0.2–1.2)
Total Protein: 6.6 g/dL (ref 6.0–8.3)

## 2023-11-11 LAB — CBC
HCT: 40.5 % (ref 39.0–52.0)
Hemoglobin: 13.6 g/dL (ref 13.0–17.0)
MCHC: 33.7 g/dL (ref 30.0–36.0)
MCV: 93 fl (ref 78.0–100.0)
Platelets: 322 K/uL (ref 150.0–400.0)
RBC: 4.35 Mil/uL (ref 4.22–5.81)
RDW: 13.3 % (ref 11.5–15.5)
WBC: 6 K/uL (ref 4.0–10.5)

## 2023-11-11 NOTE — Transitions of Care (Post Inpatient/ED Visit) (Signed)
 Stroke Discharge Follow-up   11/11/2023 Name:  Robert Pacheco MRN:  [id number] DOB:  1933-05-11  Subjective: Robert Pacheco is a 89 y.o. year old male who is a primary care patient of [name], DO An Emmi alert was received indicating patient responded to questions: Problems setting up rehab?. I reached out by phone to follow up on the alert and spoke to Patient. Patient reports to me that he has rehab set up for tomorrow. He thinks the automated question was answered incorrectly.  He denies any new problems or concerns. States that he has all his medications and is taking them as prescribed.    Care Management Interventions: follow up call with patient.   Follow up plan: No further intervention required.    Alan Ee, RN, BSN, CEN [employer]- Transition of Care Team.  [facility] [phone]

## 2023-11-11 NOTE — Progress Notes (Signed)
 Chief Complaint  Patient presents with   Hospitalization Follow-up    Hospital Follow Up    Subjective: Patient is a 89 y.o. male here for hosp f/u.  Admitted for acute CVA the: Hospital on 11/04/2023 and discharged on 10/11.  He reports no residual deficits other than left-sided lower extremity weakness.  He is eating normally.  Zocor  was stopped in the hospital due to elevated liver enzymes.  If normal today, we will restart.  He is compliant with dual antiplatelet therapy aspirin  and Plavix.  No adverse effects.  Past Medical History:  Diagnosis Date   Allergy    Coronary artery disease    Hyperlipidemia    Hypertension    Personal history of colonic adenomas 05/10/2012   Skin cancer, basal cell     Objective: BP 136/70 (BP Location: Left Arm, Cuff Size: Normal)   Pulse 80   Temp 98 F (36.7 C) (Oral)   Resp 16   Ht 5' 8 (1.727 m)   Wt 182 lb (82.6 kg)   SpO2 99%   BMI 27.67 kg/m  General: Awake, appears stated age Heart: RRR Neuro: DTR's equal and symmetric throughout, no clonus, no cerebellar signs, gait is cautious Lungs: CTAB, no rales, wheezes or rhonchi. No accessory muscle use Psych: Age appropriate judgment and insight, normal affect and mood  Assessment and Plan: Cerebrovascular disease - Plan: CBC  Transaminitis - Plan: Comprehensive metabolic panel with GFR  Continue dual antiplatelet therapy through November 1, stop aspirin  after that and continue Plavix 75 mg daily.  Will try to get statin back on board assuming #2 is normalized. Check labs as above. The patient voiced understanding and agreement to the plan.  [name] [place], DO 11/11/23  11:38 AM

## 2023-11-11 NOTE — Patient Instructions (Addendum)
 Give us  2-3 business days to get the results of your labs back.   Keep the diet clean and stay active.  After 3 weeks of aspirin , you will stop and just take Plavix only.  If labs look good, we will restart your Zocor .   Let us  know if you need anything.

## 2023-11-12 ENCOUNTER — Other Ambulatory Visit: Payer: Self-pay | Admitting: Family Medicine

## 2023-11-12 ENCOUNTER — Ambulatory Visit: Admitting: Physical Therapy

## 2023-11-12 ENCOUNTER — Other Ambulatory Visit: Payer: Self-pay

## 2023-11-12 DIAGNOSIS — I25701 Atherosclerosis of coronary artery bypass graft(s), unspecified, with angina pectoris with documented spasm: Secondary | ICD-10-CM

## 2023-11-12 DIAGNOSIS — I10 Essential (primary) hypertension: Secondary | ICD-10-CM

## 2023-11-16 ENCOUNTER — Telehealth: Payer: Self-pay

## 2023-11-16 NOTE — Telephone Encounter (Signed)
 Called pt was advised of refill on simvastatin  (ZOCOR ) 20, stated understand.

## 2023-11-16 NOTE — Telephone Encounter (Signed)
 I thought he got the message after his blood work last week? OK to refill, we are rechecking labs in a few weeks to ensure stability. Thx.

## 2023-11-16 NOTE — Telephone Encounter (Signed)
 Copied from CRM #[id number]. Topic: Clinical - Prescription Issue >> Nov 16, 2023 11:44 AM Thersia BROCKS wrote: Reason for CRM: Patient called in regarding prescription  simvastatin  (ZOCOR ) 20 MG tablet  Would like to know the status of the refilling of this medication

## 2023-11-16 NOTE — Telephone Encounter (Signed)
 Simvastatin  is paused on med list. Okay to refill?

## 2023-11-18 ENCOUNTER — Telehealth: Payer: Self-pay

## 2023-11-18 ENCOUNTER — Ambulatory Visit

## 2023-11-18 DIAGNOSIS — I25701 Atherosclerosis of coronary artery bypass graft(s), unspecified, with angina pectoris with documented spasm: Secondary | ICD-10-CM

## 2023-11-18 MED ORDER — SIMVASTATIN 20 MG PO TABS: 20.0000 mg | ORAL_TABLET | Freq: Every day | ORAL | 1 refills | Status: AC

## 2023-11-18 NOTE — Telephone Encounter (Signed)
 Rx sent.

## 2023-11-18 NOTE — Telephone Encounter (Signed)
 Copied from CRM #[id number]. Topic: Clinical - Prescription Issue >> Nov 17, 2023  2:40 PM Nessti S wrote: Reason for CRM: patient spouse called in because pharmacy stated that refill was not approved by PCP. Call back number [phone] >> Nov 18, 2023 10:40 AM Rosaria A wrote: Pulled the call. Call ID/JTAPI ID: [id number]. Patients daughter called in regards to the patients medication still not being at the pharmacy even though she was told the medication would be sent in. Medication: simvastatin  (ZOCOR ) 20 MG tablet. Per Telephone Encounter on 11/16/2023 @ 1:47pm Called pt was advised of refill on simvastatin  (ZOCOR ) 20, stated understand.   Please reach back out to patients daughter to let her know when the medication will be sent to the pharmacy.  >> Nov 17, 2023  2:48 PM CMA Lila C wrote: Error CRM sent. Not enough information, what medicine?

## 2023-11-20 ENCOUNTER — Emergency Department (HOSPITAL_COMMUNITY)

## 2023-11-20 ENCOUNTER — Ambulatory Visit: Admitting: Physical Therapy

## 2023-11-20 ENCOUNTER — Encounter (HOSPITAL_COMMUNITY): Payer: Self-pay

## 2023-11-20 ENCOUNTER — Emergency Department (HOSPITAL_COMMUNITY)
Admission: EM | Admit: 2023-11-20 | Discharge: 2023-11-20 | Disposition: A | Discharge status: Home / Self Care | Attending: Emergency Medicine | Admitting: Emergency Medicine

## 2023-11-20 ENCOUNTER — Other Ambulatory Visit: Payer: Self-pay

## 2023-11-20 DIAGNOSIS — L309 Dermatitis, unspecified: Secondary | ICD-10-CM | POA: Insufficient documentation

## 2023-11-20 DIAGNOSIS — R202 Paresthesia of skin: Secondary | ICD-10-CM | POA: Diagnosis not present

## 2023-11-20 DIAGNOSIS — Z7982 Long term (current) use of aspirin: Secondary | ICD-10-CM | POA: Diagnosis not present

## 2023-11-20 DIAGNOSIS — Z7902 Long term (current) use of antithrombotics/antiplatelets: Secondary | ICD-10-CM | POA: Insufficient documentation

## 2023-11-20 DIAGNOSIS — Z8673 Personal history of transient ischemic attack (TIA), and cerebral infarction without residual deficits: Secondary | ICD-10-CM | POA: Insufficient documentation

## 2023-11-20 DIAGNOSIS — R200 Anesthesia of skin: Secondary | ICD-10-CM | POA: Diagnosis present

## 2023-11-20 LAB — CBC WITH DIFFERENTIAL/PLATELET
Abs Immature Granulocytes: 0.03 K/uL (ref 0.00–0.07)
Basophils Absolute: 0 K/uL (ref 0.0–0.1)
Basophils Relative: 1 %
Eosinophils Absolute: 0 K/uL (ref 0.0–0.5)
Eosinophils Relative: 1 %
HCT: 43.6 % (ref 39.0–52.0)
Hemoglobin: 14.1 g/dL (ref 13.0–17.0)
Immature Granulocytes: 1 %
Lymphocytes Relative: 25 %
Lymphs Abs: 1.3 K/uL (ref 0.7–4.0)
MCH: 31.1 pg (ref 26.0–34.0)
MCHC: 32.3 g/dL (ref 30.0–36.0)
MCV: 96.2 fL (ref 80.0–100.0)
Monocytes Absolute: 0.4 K/uL (ref 0.1–1.0)
Monocytes Relative: 7 %
Neutro Abs: 3.5 K/uL (ref 1.7–7.7)
Neutrophils Relative %: 65 %
Platelets: 294 K/uL (ref 150–400)
RBC: 4.53 MIL/uL (ref 4.22–5.81)
RDW: 12.4 % (ref 11.5–15.5)
WBC: 5.3 K/uL (ref 4.0–10.5)
nRBC: 0 % (ref 0.0–0.2)

## 2023-11-20 LAB — COMPREHENSIVE METABOLIC PANEL WITH GFR
ALT: 12 U/L (ref 0–44)
AST: 17 U/L (ref 15–41)
Albumin: 3.7 g/dL (ref 3.5–5.0)
Alkaline Phosphatase: 72 U/L (ref 38–126)
Anion gap: 11 (ref 5–15)
BUN: 10 mg/dL (ref 8–23)
CO2: 21 mmol/L — ABNORMAL LOW (ref 22–32)
Calcium: 8.8 mg/dL — ABNORMAL LOW (ref 8.9–10.3)
Chloride: 105 mmol/L (ref 98–111)
Creatinine, Ser: 0.97 mg/dL (ref 0.61–1.24)
GFR, Estimated: >60 mL/min (ref >60)
Glucose, Bld: 102 mg/dL — ABNORMAL HIGH (ref 70–99)
Potassium: 4.4 mmol/L (ref 3.5–5.1)
Sodium: 137 mmol/L (ref 135–145)
Total Bilirubin: 0.8 mg/dL (ref 0.0–1.2)
Total Protein: 6.7 g/dL (ref 6.5–8.1)

## 2023-11-20 LAB — TROPONIN I (HIGH SENSITIVITY)
Troponin I (High Sensitivity): 6 ng/L (ref <18)
Troponin I (High Sensitivity): 8 ng/L (ref <18)

## 2023-11-20 LAB — MAGNESIUM: Magnesium: 2.3 mg/dL (ref 1.7–2.4)

## 2023-11-20 NOTE — ED Provider Notes (Addendum)
 Robert Pacheco EMERGENCY DEPARTMENT AT [facility] Provider Note   CSN: [id number] Arrival date & time: 11/20/23  0102     Patient presents with: Numbness   Robert Pacheco is a 89 y.o. male.   presents with sudden onset numbness in both arms and legs, beginning around midnight. The numbness is described as numb numb, affecting the areas from the elbows to the fingertips and from the knees down. The patient reports that the numbness has since resolved. He has a history of a stroke on October 8, which he believes was due to a clot, but he is unsure of the specifics. He denies any recent illnesses, neck injuries, nausea, vomiting, or fever. The patient reports no residual deficits from the previous stroke. He has been eating and drinking normally and denies any changes in vision. History was obtained from the patient.        Prior to Admission medications   Medication Sig Start Date End Date Taking? Authorizing Provider  acetaminophen  (TYLENOL ) 500 MG tablet Take 500 mg by mouth daily as needed for mild pain (pain score 1-3).    [provider]  aspirin  81 MG chewable tablet Chew 1 tablet (81 mg total) by mouth daily. 11/07/23   Ghimire, Donalda HERO, MD  clopidogrel (PLAVIX) 75 MG tablet Take 1 tablet (75 mg total) by mouth daily. 11/08/23   Ghimire, Donalda HERO, MD  Loratadine (CLARITIN PO) Take 1 tablet by mouth as needed.    [provider]  meloxicam  (MOBIC ) 7.5 MG tablet Take 1 tablet (7.5 mg total) by mouth daily. 09/21/23   [name], DO  metoprolol  succinate (TOPROL -XL) 25 MG 24 hr tablet Take 0.5 tablets (12.5 mg total) by mouth at bedtime. 06/12/23   [name], DO  NITROSTAT  0.4 MG SL tablet DISSOLVE ONE TABLET UNDER THE TONGUE EVERY 5 MINUTES AS NEEDED. 09/21/23   [name], DO  Omega-3 Fatty Acids (FISH OIL) 1000 MG CAPS Take 1 capsule by mouth in the morning.    [provider]  polyethylene glycol (MIRALAX  / GLYCOLAX) 17 g packet Take 17 g by mouth daily as needed for mild constipation.    [provider]  psyllium (HYDROCIL/METAMUCIL) 95 % PACK Take 1 packet by mouth daily.    [provider]  simvastatin  (ZOCOR ) 20 MG tablet Take 1 tablet (20 mg total) by mouth daily at 6 PM. 11/18/23   Wendling, [name], DO    Allergies: Other    Review of Systems  Updated Vital Signs BP (!) 171/78   Pulse 63   Temp 97.7 F (36.5 C) (Oral)   Resp 19   Ht 5' 7 (1.702 m)   Wt 81.6 kg   SpO2 100%   BMI 28.19 kg/m   Physical Exam Vitals and nursing note reviewed.  Constitutional:      Appearance: He is well-developed.  HENT:     Head: Normocephalic and atraumatic.  Cardiovascular:     Rate and Rhythm: Normal rate.  Pulmonary:     Effort: Pulmonary effort is normal. No respiratory distress.  Abdominal:     General: There is no distension.  Musculoskeletal:        General: Normal range of motion.     Cervical back: Normal range of motion.  Skin:    Comments: Stasis dermatitis BLE  Neurological:     Mental Status: He is alert.     (all labs ordered are listed, but only abnormal  results are displayed) Labs Reviewed  COMPREHENSIVE METABOLIC PANEL WITH GFR - Abnormal; Notable for the following components:      Result Value   CO2 21 (*)    Glucose, Bld 102 (*)    Calcium 8.8 (*)    All other components within normal limits  CBC WITH DIFFERENTIAL/PLATELET  MAGNESIUM  TROPONIN I (HIGH SENSITIVITY)  TROPONIN I (HIGH SENSITIVITY)    EKG: None  Radiology: MR Cervical Spine Wo Contrast Result Date: 11/20/2023 EXAM: MRI CERVICAL SPINE WITHOUT CONTRAST 11/20/2023 04:51:00 AM TECHNIQUE: Multiplanar multisequence MRI of the cervical spine was performed. COMPARISON: Brain MRI 11/05/2023, CTA head and neck 11/04/2023. CLINICAL HISTORY: 89 year old male with acute cervical spine myelopathy. FINDINGS: BONES AND ALIGNMENT: Stable cervical lordosis from the CTA this month  with mild degenerative retrolisthesis of C2 on C3. Degenerative ankylosis of the cervical levels C3-C4, C4-C5 and probably developing at both C5-C6 and C6-C7 based on CTA appearance this month. Background bone marrow signal within normal limits. Maintained vertebral height. No marrow edema. SPINAL CORD: Multilevel degenerative spinal cord mass effect. No definite abnormality of cord signal. SOFT TISSUES: No paraspinal mass. Negative visible cervicomedullary junction and posterior fossa. Preserved major vascular flow voids in the bilateral neck with some generalized arterial tortuosity. Left vertebral artery somewhat dominant as before. Negative visible thoracic inlet. C2-C3: Multifactorial spinal stenosis related to retrolisthesis, disc osteophyte complex, ligamentum flavum hypertrophy. Mild dorsal spinal cord mass effect. No cord signal abnormality. Mild to moderate C3 neural foraminal stenosis. C3-C4: Ankylosed. Spinal stenosis with mild spinal cord mass effect. No cord signal abnormality. Moderate to severe right C4 foraminal stenosis. C4-C5: Ankylosed. Moderate spinal stenosis and spinal cord mass effect. No cord signal abnormality. Moderate left and mild right C5 neural foraminal stenosis. C5-C6: Mild spinal stenosis and spinal cord mass effect related to disc osteophyte complex and moderate facet and ligamentum flavum hypertrophy. No cord signal abnormality. Mild left and moderate right C6 foraminal stenosis. C6-C7: Mild spinal stenosis and ventral cord mass effect from bulky circumferential disc osteophyte complex, ligamentum flavum hypertrophy. Moderate facet hypertrophy. Moderate left and severe right C7 neural foraminal stenosis. C7-T1: Facet hypertrophy. No significant stenosis. No visible upper thoracic spinal stenosis. IMPRESSION: 1. Multifactorial degenerative cervical spinal stenosis WITH spinal cord mass effect from C2-C3 through C6-C7. No spinal cord edema or myelomalacia identified. 2. Underlying  Degenerative ankylosis at C3-C4 and C4-C5, and probably developing ankylosis at C5-C6 and C6-C7. 3. Degenerative severe right C4 and C7 nerve level foraminal stenosis, moderate at the left C5, right C6, left C7 levels. Electronically signed by: Helayne Hurst MD 11/20/2023 05:40 AM EDT RP Workstation: [id number]   CT Head Wo Contrast Result Date: 11/20/2023 EXAM: CT HEAD WITHOUT CONTRAST 11/20/2023 01:37:01 AM TECHNIQUE: CT of the head was performed without the administration of intravenous contrast. Automated exposure control, iterative reconstruction, and/or weight based adjustment of the mA/kV was utilized to reduce the radiation dose to as low as reasonably achievable. COMPARISON: CT head Nov 04, 2023 MRI head Nov 05, 2023 CLINICAL HISTORY: Headache, neuro deficit. FINDINGS: BRAIN AND VENTRICLES: No acute hemorrhage. No evidence of new/interval acute infarct. Patchy white matter hypodensities are compatible with chronic microvascular ischemic change. No hydrocephalus. No extra-axial collection. No mass effect or midline shift. ORBITS: No acute abnormality. SINUSES: No acute abnormality. SOFT TISSUES AND SKULL: No skull fracture. IMPRESSION: 1. No evidence of interval acute abnormality. 2. Chronic microvascular ischemic change. Electronically signed by: Gilmore Molt MD 11/20/2023 01:49 AM EDT RP Workstation: [id number]  Procedures   Medications Ordered in the ED - No data to display                                  Medical Decision Making Amount and/or Complexity of Data Reviewed Labs: ordered. Radiology: ordered. ECG/medicine tests: ordered.   The patient arrived from home with sudden onset numbness in both arms and legs, starting around midnight. The numbness was described as numb numb rather than tingling, affecting the areas from the elbows down and below the knees. The patient has a history of a stroke on October 8, believed to be due to a clot, but he is unsure. On examination, the  patient was able to walk and had no residual deficits from the previous stroke. Vital signs included a blood pressure of 168/100. The patient denied any recent illnesses, nausea, vomiting, or fever. Neurological examination showed intact sensation to pinprick. I doubt the current symptoms are due to a stroke and considered electrolyte imbalances or spinal issues as potential causes.  I discussed with neurology and we did not feel this was consistent with CVA but could be a spinal cord issues so MRI of his cervical spine was done.  This showed significant degenerative disc disease with spinal cord mass effect.  He has no motor symptoms.  I have watched him ambulate.  I discussed with neurosurgery (Dr. Perlie) who did not recommend any emergent neurosurgical intervention or medications but does want him to follow-up in the office.  I discussed evolution of the disease and when to return to the emergency room.  Patient stable for discharge at this time  Final diagnoses:  Paresthesia    ED Discharge Orders     None          Francee Setzer, Selinda, MD 11/20/23 [id number]    Lorette Selinda, MD 11/20/23 [phone]

## 2023-11-20 NOTE — Therapy (Incomplete)
 OUTPATIENT PHYSICAL THERAPY NEURO EVALUATION   Patient Name: Robert Pacheco MRN: [id number] DOB:1934/01/20, 89 y.o., male Today's Date: 11/20/2023   PCP: [name], DO REFERRING PROVIDER: Raenelle Donalda HERO, MD  END OF SESSION:   Past Medical History:  Diagnosis Date   Allergy    Coronary artery disease    Hyperlipidemia    Hypertension    Personal history of colonic adenomas 05/10/2012   Skin cancer, basal cell    Past Surgical History:  Procedure Laterality Date   CATARACT EXTRACTION     COLONOSCOPY  multiple   CORONARY ARTERY BYPASS GRAFT  2000   EYE SURGERY     HERNIA REPAIR     MOHS SURGERY     Patient Active Problem List   Diagnosis Date Noted   Transaminitis 11/06/2023   History of TIA (transient ischemic attack) 11/05/2023   Right sided weakness 11/05/2023   Facial droop 11/05/2023   History of atrial tachycardia 11/05/2023   History of CAD (coronary artery disease) 11/05/2023   Acute CVA (cerebrovascular accident) (HCC) 11/05/2023   Elevated liver enzymes 11/05/2023   PSVT (paroxysmal supraventricular tachycardia) 03/27/2021   Senile purpura 09/26/2020   Bilateral leg edema 07/11/2020   Dizziness 07/11/2020   Coronary artery disease involving native coronary artery of native heart without angina pectoris 07/11/2020   Mixed hyperlipidemia 07/11/2020   Allergy 07/10/2020   Lightheadedness 04/23/2020   Lumbar radiculopathy 08/08/2019   Greater trochanteric pain syndrome of right lower extremity 04/18/2019   Constipation 04/18/2019   Bilateral lower extremity edema 10/15/2018   Displacement of intraocular lens 06/16/2017   Combined forms of age-related cataract of left eye 03/09/2014   Regular astigmatism 03/09/2014   Family history of colon cancer 10/01/2012   History of colonic polyps 05/10/2012   Personal history of colonic adenomas 05/10/2012   BPH (benign prostatic hyperplasia) 03/27/2009   DYSPLASTIC NEVUS, FACE 10/05/2008    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  Rationale for Evaluation and Treatment: Rehabilitation  SUBJECTIVE:                                                                                                                                                                                             SUBJECTIVE STATEMENT: *** Pt accompanied by: {accompnied:27141}  PERTINENT HISTORY: TIA, CAD status post CABG 2000, HTN, paroxysmal atrial tachycardia on Zio  patch, HLD.  PAIN:  Are you having pain? {OPRCPAIN:27236}  PRECAUTIONS: {Therapy precautions:24002}  RED FLAGS: {PT Red Flags:29287}   WEIGHT BEARING RESTRICTIONS: {Yes ***/No:24003}  FALLS: Has patient fallen in last 6 months? {fallsyesno:27318}  LIVING ENVIRONMENT: Lives with: {OPRC lives with:25569::lives with their family} Lives in: {Lives in:25570} Stairs: {opstairs:27293} Has following equipment at home: {Assistive devices:23999}  PLOF: {PLOF:24004}  PATIENT GOALS: ***  OBJECTIVE:  Note: Objective measures were completed at Evaluation unless otherwise noted.  DIAGNOSTIC FINDINGS: MRI of brain from 11/05/23 IMPRESSION: 1. Small acute perforator infarct in the left basal ganglia.  CT of C-spine from 11/20/23 IMPRESSION: 1. Multifactorial degenerative cervical spinal stenosis WITH spinal cord mass effect from C2-C3 through C6-C7. No spinal cord edema or myelomalacia identified. 2. 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 3. Degenerative severe right C4 and C7 nerve level foraminal stenosis, moderate at the left C5, right C6, left C7 levels.   COGNITION: Overall  cognitive status: {cognition:24006}   SENSATION: {sensation:27233}  COORDINATION: ***  EDEMA:  {edema:24020}  MUSCLE TONE: {LE tone:25568}  MUSCLE LENGTH: Hamstrings: Right *** deg; Left *** deg Debby test: Right *** deg; Left *** deg  DTRs:  {DTR SITE:24025}  POSTURE: {posture:25561}  LOWER EXTREMITY ROM:     {AROM/PROM:27142}  Right Eval Left Eval  Hip flexion    Hip extension    Hip abduction    Hip adduction    Hip internal rotation    Hip external rotation    Knee flexion    Knee extension    Ankle dorsiflexion    Ankle plantarflexion    Ankle inversion    Ankle eversion     (Blank rows = not tested)  LOWER EXTREMITY MMT:    MMT Right Eval Left Eval  Hip flexion    Hip extension    Hip abduction    Hip adduction    Hip internal rotation    Hip external rotation    Knee flexion    Knee extension    Ankle dorsiflexion    Ankle plantarflexion    Ankle inversion    Ankle eversion    (Blank rows = not tested)  BED MOBILITY:  {bed mobility:32615:p}  TRANSFERS: {transfers eval:32620}  RAMP:  {ramp eval:32616}  CURB:  {curb eval:32617}  STAIRS: {stairs eval:32618} GAIT: Findings: {GaitneuroPT:32644::Distance walked: ***,Comments: ***}  FUNCTIONAL TESTS:  {Functional tests:24029}  PATIENT SURVEYS:  {rehab surveys:24030}                                                                                                                              TREATMENT DATE: ***    PATIENT EDUCATION: Education details: *** Person educated: {Person educated:25204} Education method: {Education Method:25205} Education comprehension: {Education Comprehension:25206}  HOME EXERCISE PROGRAM: ***  GOALS: Goals reviewed with patient? Yes  SHORT TERM GOALS: Target date: ***  *** Baseline: Goal status: INITIAL  2.  *** Baseline:  Goal status: INITIAL  3.  *** Baseline:  Goal status: INITIAL  4.  *** Baseline:  Goal status:  INITIAL  5.  *** Baseline:  Goal status: INITIAL  6.  *** Baseline:  Goal status: INITIAL  LONG TERM GOALS: Target date: ***  *** Baseline:  Goal status: INITIAL  2.  *** Baseline:  Goal status: INITIAL  3.  *** Baseline:  Goal status: INITIAL  4.  *** Baseline:  Goal status: INITIAL  5.  *** Baseline:  Goal status: INITIAL  6.  *** Baseline:  Goal status: INITIAL  ASSESSMENT:  CLINICAL IMPRESSION: Patient is a 89 year old male referred to Neuro OPPT for CVA.   Pt's PMH is significant for: TIA, CAD status post CABG 2000, HTN, paroxysmal atrial tachycardia on Zio patch, HLD. The following deficits were present during the exam: ***. Based on ***, pt is an incr risk for falls. Pt would benefit from skilled PT to address these impairments and functional limitations to maximize functional mobility independence   OBJECTIVE IMPAIRMENTS: {opptimpairments:25111}.   ACTIVITY LIMITATIONS: {activitylimitations:27494}  PARTICIPATION LIMITATIONS: {participationrestrictions:25113}  PERSONAL FACTORS: {Personal factors:25162} are also affecting patient's functional outcome.   REHAB POTENTIAL: {rehabpotential:25112}  CLINICAL DECISION MAKING: {clinical decision making:25114}  EVALUATION COMPLEXITY: {Evaluation complexity:25115}  PLAN:  PT FREQUENCY: {rehab frequency:25116}  PT DURATION: {rehab duration:25117}  PLANNED INTERVENTIONS: {rehab planned interventions:25118::97110-Therapeutic exercises,97530- Therapeutic [phone]- Neuromuscular re-education,97535- Self Rjmz,02859- Manual therapy,Patient/Family education}  PLAN FOR NEXT SESSION: ***   Daekwon Beswick E Reuven Braver, PT, DPT 11/20/2023, 8:36 AM

## 2023-11-20 NOTE — Progress Notes (Signed)
 89 y/o M, hx acute basal ganglia infarct 2 weeks ago newly placed on DAPT who reportedly developed acute onset numbness in a stocking glove distribution. MRI Cspine shows diffuse spondylosis with varying degrees of central stenosis   Recommendations: Nothing to do acutely. Unclear if the symptoms are caused by cervical stenosis vs something else. Even if from cervical stenosis, unable to do anything given fresh infarct and DAPT Will see in clinic No collar necessry

## 2023-11-20 NOTE — ED Notes (Signed)
 Pt returned from MRI

## 2023-11-20 NOTE — ED Triage Notes (Signed)
 PT brought in by [facility] for numbness that started at midnight in bilateral arms legs. PT was able to walk to stretcher when ems arrived and walked to ed bed upon arrival to ed. PT is A&OX4. PT bp was slightly elevated. PT states still numbness

## 2023-11-20 NOTE — ED Notes (Signed)
 Pt to MRI

## 2023-11-23 ENCOUNTER — Telehealth: Payer: Self-pay

## 2023-11-23 DIAGNOSIS — I639 Cerebral infarction, unspecified: Secondary | ICD-10-CM

## 2023-11-23 NOTE — Transitions of Care (Post Inpatient/ED Visit) (Signed)
 Stroke Discharge Follow-up   11/23/2023 Name:  KYMIR COLES MRN:  [id number] DOB:  1933-05-13  Subjective: Robert Pacheco is a 89 y.o. year old male who is a primary care patient of [name], DO An Emmi alert was received indicating patient responded to questions: Went to follow-up appointment?. I reached out by phone to follow up on the alert and spoke to Patient.  Care Management Interventions:  Placed call to patient who confirms he had his hospital follow up and will see neurology tomorrow.  Denies any new problems or concerns.   Follow up plan: No further intervention required.   Alan Ee, RN, BSN, CEN [employer]- Transition of Care Team.  [facility] [phone]

## 2023-11-24 ENCOUNTER — Encounter: Payer: Self-pay | Admitting: Neurology

## 2023-11-24 ENCOUNTER — Ambulatory Visit: Admitting: Neurology

## 2023-11-24 ENCOUNTER — Other Ambulatory Visit

## 2023-11-24 VITALS — BP 140/72 | HR 70 | Ht 67.0 in | Wt 180.0 lb

## 2023-11-24 DIAGNOSIS — I639 Cerebral infarction, unspecified: Secondary | ICD-10-CM | POA: Diagnosis not present

## 2023-11-24 DIAGNOSIS — M4802 Spinal stenosis, cervical region: Secondary | ICD-10-CM

## 2023-11-24 DIAGNOSIS — R202 Paresthesia of skin: Secondary | ICD-10-CM

## 2023-11-24 LAB — FOLATE: Folate: 18.9 ng/mL

## 2023-11-24 LAB — VITAMIN B12: Vitamin B-12: 196 pg/mL — ABNORMAL LOW (ref 200–1100)

## 2023-11-24 NOTE — Progress Notes (Signed)
 Follow-up Visit   Date: 11/24/2023    [name] MRN: [id number] DOB: 1933-02-28    Robert Pacheco is a 89 y.o.  right-handed male with CAD s/p CABG, hypertension, and hyperlipidemia returning to the clinic for follow-up of right CTS and TIA.  The patient was accompanied to the clinic by self.   IMPRESSION/PLAN: Left basal ganglia stroke (10/2023) due to small vessel disease manifesting with right facial droop and right side numbness.  Facial droop has improved.  He has mild ongoing numbness on the left.  He has history of TIA in May 2025 presenting with dysarthria and right arm numbness and managed on aspirin .   - He will be completing 3 weeks of aspirin  + plavix this week and then continue monotherapy with plavix 75mg  daily  - Continue simvastatin  20mg  (LDL is 42)  - Blood pressure is elevated today, however he reports it is normal at home. I have asked him to continue to monitor this at home.    2.  Multilevel cervical canal stenosis from C2-C3 through C6-7 manifesting with sensory deficits involving bilateral arms and legs.  He does not have any weakness or myelopathy.  He is scheduled to see neurosurgery next week.  Based on his symptomology, I recommend conservative management.  He denies painful paresthesias and therefore there is no role for medications, such as gabapentin.   3.  Paresthesias around the lips - check vitamin B12, folate  Return to clinic in 6 months  --------------------------------------------- History of present illness: He was in the ER on 5/12 where he presented with abdominal pain, slurred speech, and right hand numbness.  Right arm numbness and slurred speech resolved within 5-10 minutes.  MRI brain did not reveal acute stroke.  He takes aspirin  81mg  and simvastatin  20mg  daily.  No history of TIA, stroke, diabetes, or tobacco use.    He reports doing well.  Today in the office, he developed similar numbness of the right hand.  He denies face or leg  numbness/tingling.  No weakness in the right hand.     He lives alone.  He has four children, three of which live locally.     UPDATE 10/27/2023:  He is here for follow-up visit and denies any new complaints.  He has not had any further spells of speech difficulty or right arm weakness/numbness.  He denies numbness/tingling of the hands.   UPDATE 11/24/2023:  He is here for hospital discharge follow-up.  He presented to the ER on 10/8 after presenting with abdominal pain, numbness involving the right side, and right facial droop.  MRI brain shows left basal ganglia stroke.  There was no large vessel occlusion.  He was already on aspirin  and started on asa +plavix 75mg  x 3 weeks, then instructed to take plavix 75mg  daily.  His facial weakness has improved.  On 10/24, he went to the ER because of sudden onset of numbness involving both arms and legs, from the elbows into the hands and from the knees down.  MRI cervical spine was obtained and shows significant degenerative changes with multilevel central canal stenosis with mass effect. He will be seeing Dr. Darnella for next week. He continues to have spells of numbness involving the arms and leg which occurs a few times per week.  Symptoms are not constant.  He denies arm/leg weakness or imbalance. He has some neck discomfort, which is worse at night time. He also mentions mild numbness around the lips on both side  of the mouth.  No problems with speech or swallow.    Medications:  Current Outpatient Medications on File Prior to Visit  Medication Sig Dispense Refill   acetaminophen  (TYLENOL ) 500 MG tablet Take 500 mg by mouth daily as needed for mild pain (pain score 1-3).     aspirin  81 MG chewable tablet Chew 1 tablet (81 mg total) by mouth daily. 21 tablet 0   clopidogrel (PLAVIX) 75 MG tablet Take 1 tablet (75 mg total) by mouth daily. 90 tablet 2   Loratadine (CLARITIN PO) Take 1 tablet by mouth as needed.     meloxicam  (MOBIC ) 7.5 MG tablet Take 1  tablet (7.5 mg total) by mouth daily. 30 tablet 0   metoprolol  succinate (TOPROL -XL) 25 MG 24 hr tablet Take 0.5 tablets (12.5 mg total) by mouth at bedtime. 45 tablet 2   NITROSTAT  0.4 MG SL tablet DISSOLVE ONE TABLET UNDER THE TONGUE EVERY 5 MINUTES AS NEEDED. 25 tablet 1   Omega-3 Fatty Acids (FISH OIL) 1000 MG CAPS Take 1 capsule by mouth in the morning.     polyethylene glycol (MIRALAX / GLYCOLAX) 17 g packet Take 17 g by mouth daily as needed for mild constipation.     psyllium (HYDROCIL/METAMUCIL) 95 % PACK Take 1 packet by mouth daily.     simvastatin  (ZOCOR ) 20 MG tablet Take 1 tablet (20 mg total) by mouth daily at 6 PM. 90 tablet 1   No current facility-administered medications on file prior to visit.    Allergies:  Allergies  Allergen Reactions   Other Nausea Only    UNKNOWN PAIN MED    Vital Signs:  BP (!) 166/73   Pulse 70   Ht 5' 7 (1.702 m)   Wt 180 lb (81.6 kg)   SpO2 98%   BMI 28.19 kg/m    Neurological Exam: MENTAL STATUS including orientation to time, place, person, recent and remote memory, attention span and concentration, language, and fund of knowledge is normal.  Speech is not dysarthric.  CRANIAL NERVES:   Pupils equal round and reactive to light.  Normal conjugate, extra-ocular eye movements in all directions of gaze.  No ptosis.  Face is symmetric.  MOTOR:  Motor strength is 5/5 in all extremities.  No atrophy, fasciculations or abnormal movements.  No pronator drift.  Tone is normal.    MSRs:  Reflexes are 2+/4 throughout, except 1+/4 at the ankles  SENSORY:  Vibration is mildly reduced at the right knee as compared to the left, otherwise vibration intact throughout.  Temperature and pin prick intact.  COORDINATION/GAIT:  Normal finger-to- nose-finger.  Intact rapid alternating movements bilaterally.  Gait shows stooped posture, narrow based and stable. Unassisted.   Data: NCS/EMG of the right hand 08/06/2023: Right median neuropathy at or distal  to the wrist, consistent with a clinical diagnosis of carpal tunnel syndrome.  Overall, these findings are moderate in degree electrically.  MRI brain wo contrast 06/08/2023: 1.  No evidence of an acute intracranial abnormality. 2. Moderate cerebral white matter chronic small vessel ischemic disease. 3. Generalized parenchymal atrophy. 4. Left sphenoid sinusitis.  MRA HEAD 09/23/2023: Normal intracranial MRA. No large vessel occlusion, hemodynamically significant stenosis, or other acute vascular abnormality. No aneurysm.   MRA NECK: 1. Mild for age atheromatous irregularity about the carotid bifurcations with associated mild 30% stenosis bilaterally. 2. Wide patency of both vertebral arteries within the neck. Left vertebral artery dominant.  MRI cervical spine wo contrast 11/20/2023: 1. Multifactorial degenerative cervical  spinal stenosis WITH spinal cord mass effect from C2-C3 through C6-C7. No spinal cord edema or myelomalacia identified. 2. 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 3. Degenerative severe right C4 and C7 nerve level foraminal stenosis, moderate at the left C5, right C6, left C7 levels.  MRI brain wo contrast 11/05/2023: 1. Small acute perforator infarct in the left basal ganglia.     Thank you for allowing me to participate in patient's care.  If I can answer any additional questions, I would be pleased to do so.    Sincerely,    Freman Lapage K. Tobie, DO

## 2023-11-24 NOTE — Therapy (Signed)
 OUTPATIENT PHYSICAL THERAPY NEURO EVALUATION   Patient Name: Robert Pacheco MRN: [id number] DOB:[birth year], 89 y.o., male Today's Date: 11/25/2023   PCP: [name], DO REFERRING PROVIDER: Raenelle Donalda HERO, MD  END OF SESSION:  PT End of Session - 11/25/23 1406     Visit Number 1    Number of Visits 13   12 plus Eval   Date for Recertification  01/22/24   for scheduling delays   Authorization Type [employer]    Progress Note Due on Visit 10    PT Start Time 1405    PT Stop Time 1450    PT Time Calculation (min) 45 min    Equipment Utilized During Treatment Gait belt    Activity Tolerance Patient tolerated treatment well    Behavior During Therapy WFL for tasks assessed/performed          Past Medical History:  Diagnosis Date   Allergy    Coronary artery disease    Hyperlipidemia    Hypertension    Personal history of colonic adenomas 05/10/2012   Skin cancer, basal cell    Past Surgical History:  Procedure Laterality Date   CATARACT EXTRACTION     COLONOSCOPY  multiple   CORONARY ARTERY BYPASS GRAFT  2000   EYE SURGERY     HERNIA REPAIR     MOHS SURGERY     Patient Active Problem List   Diagnosis Date Noted   Transaminitis 11/06/2023   History of TIA (transient ischemic attack) 11/05/2023   Right sided weakness 11/05/2023   Facial droop 11/05/2023   History of atrial tachycardia 11/05/2023   History of CAD (coronary artery disease) 11/05/2023   Acute CVA (cerebrovascular accident) (HCC) 11/05/2023   Elevated liver enzymes 11/05/2023   PSVT (paroxysmal supraventricular tachycardia) 03/27/2021   Senile purpura 09/26/2020   Bilateral leg edema 07/11/2020   Dizziness 07/11/2020   Coronary artery disease involving native coronary artery of native heart without angina pectoris 07/11/2020   Mixed hyperlipidemia 07/11/2020   Allergy 07/10/2020   Lightheadedness 04/23/2020   Lumbar radiculopathy 08/08/2019   Greater trochanteric pain syndrome  of right lower extremity 04/18/2019   Constipation 04/18/2019   Bilateral lower extremity edema 10/15/2018   Displacement of intraocular lens 06/16/2017   Combined forms of age-related cataract of left eye 03/09/2014   Regular astigmatism 03/09/2014   Family history of colon cancer 10/01/2012   History of colonic polyps 05/10/2012   Personal history of colonic adenomas 05/10/2012   BPH (benign prostatic hyperplasia) 03/27/2009   DYSPLASTIC NEVUS, FACE 10/05/2008   Atherosclerosis of coronary artery bypass graft of native heart with angina pectoris with documented spasm    Hyperlipidemia 02/22/2007   Essential hypertension 02/22/2007    ONSET DATE: 11/07/2023 (Date of referral)  REFERRING DIAG: I63.9 (ICD-10-CM) - CVA (cerebral vascular accident) (HCC)  THERAPY DIAG:  Other abnormalities of gait and mobility  Acute CVA (cerebrovascular accident) (HCC)  History of falling  Rationale for Evaluation and Treatment: Rehabilitation  SUBJECTIVE:  SUBJECTIVE STATEMENT: Pt reports having a stroke recently; still has numbness in B hands and feet (comes and goes) but thinks it is due to neck issues.  Saw Neurology yesterday; not sure when following up with Neurosurgery. Pt accompanied by: self and family member (son stayed in waiting room)  PERTINENT HISTORY: Hospitalization 11/04/23 to 11/07/23; pt with R facial droop, dysphagia, and R sided weakness; found to have acute L basal ganglia CVA.  ED visit 11/19/24 d/t sudden onset numbness B UE's/LE's (sx's then resolved; no motor symptoms); imaging showing significant DDD with spinal cord mass effect--recommended f/u OP with Neurosurgery; no collar necessary per MD [name].  Per follow-up Neurology note from DO [facility] 11/24/23 pt continues to have spells of  numbness involving the arms and leg which occurs a few times per week. Symptoms are not constant. He denies arm/leg weakness or imbalance. He has some neck discomfort, which is worse at night time. He also mentions mild numbness around the lips on both side of the mouth. No problems with speech or swallow.   PMH includes CAD s/p CABG, htn, PSVT, HLD.  PAIN:  Are you having pain? No  PRECAUTIONS: Fall; C-spine imaging showing significant DDD with spinal cord mass effect  RED FLAGS: Cervical red flags: Dysphagia No, Dysmetria No, Diplopia No, Nystagmus No, and Nausea No   WEIGHT BEARING RESTRICTIONS: No  FALLS: Has patient fallen in last 6 months? Yes. Number of falls 1 (fell between bed and end table; skin tear to L UE)  LIVING ENVIRONMENT: Lives with: lives alone Lives in: House/apartment Stairs: Yes: External: 2 steps; none Has following equipment at home: Single point cane, Walker - 2 wheeled, shower chair, and Grab bars.  PLOF: Independent.  Hasn't drove lately (waiting until has more energy back).  Mostly uses walker in bedroom.  PATIENT GOALS: Be able to move a little better.  Improve leg strength.  OBJECTIVE:  Note: Objective measures were completed at Evaluation unless otherwise noted.  DIAGNOSTIC FINDINGS:  MRI Brain 11/05/23: Small acute perforator infarct in the left basal ganglia.   MRI c-spine 11/20/23: 1. Multifactorial degenerative cervical spinal stenosis WITH spinal cord mass effect from C2-C3 through C6-C7. No spinal cord edema or myelomalacia identified. 2. 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 3. Degenerative severe right C4 and C7 nerve level foraminal stenosis, moderate at the left C5, right C6, left C7 levels.  COGNITION: Overall cognitive status: Within functional limits for tasks assessed   SENSATION: Intact B LE light touch (pt with self reported decreased sensation below knees to  feet B)  COORDINATION: Mild decreased B rapid alternating toe tapping Edema: L LE more edematous than R LE   MUSCLE TONE: Intact B LE's  Edema: 28.8 cm L LE malleolar line    30 cm R LE malleolar line    Chronic LE edema per pt report  POSTURE: rounded shoulders, forward head, and posterior pelvic tilt  LOWER EXTREMITY ROM:     Active  Right Eval Left Eval  Hip flexion WNL WNL  Hip extension    Hip abduction    Hip adduction    Hip internal rotation    Hip external rotation    Knee flexion WNL WNL  Knee extension WNL WNL  Ankle dorsiflexion WNL WNL  Ankle plantarflexion WNL WNL  Ankle inversion    Ankle eversion     (Blank rows = not tested)  LOWER EXTREMITY MMT:    MMT  Right Eval Left Eval  Hip flexion 5/5 4+/5  Hip extension    Hip abduction    Hip adduction    Hip internal rotation    Hip external rotation    Knee flexion 5/5 4+/5  Knee extension 5/5 4+/5  Ankle dorsiflexion At least 3/5 AROM At least 3/5 AROM  Ankle plantarflexion    Ankle inversion    Ankle eversion    (Blank rows = not tested)  TRANSFERS: Use of B UE's Sit to stand: Modified independence  Assistive device utilized: None     Stand to sit: Modified independence  Assistive device utilized: None      GAIT: Findings: Mild flexed posture; decreased B LE step length/foot clearance;  decreased B LE heel-strike (more foot flat); decreased R UE (compared to L UE) arm swing; clinic distances; SBA  FUNCTIONAL TESTS:  5 times sit to stand: 17.28 seconds; B UE support 10 meter walk test: 0.877 m/sec (11.40 seconds; no AD use) SLS: 2.78 seconds R LE; 3.43 seconds L LE  PATIENT SURVEYS:  ABC Scale: TBA                                                                                                                             TREATMENT DATE: 11/25/23   Therapeutic Exercise (pt also issued written HEP handout--see below for details) Standing March with Counter Support  x10 reps B LE's Heel  Raises with Counter Support  x10 reps B LE's Standing Hip Abduction with Counter Support x10 reps B LE's  PATIENT EDUCATION: Education details: Eval results; POC, HEP. Person educated: Patient Education method: Explanation, Demonstration, Verbal cues, and Handouts Education comprehension: verbalized understanding, returned demonstration, verbal cues required, and needs further education  HOME EXERCISE PROGRAM: Access Code: [email or web address] URL: [email or web address] Date: 11/25/2023 Prepared by: Damien Caulk  Exercises - Standing March with Counter Support  - 1 x daily - 5 x weekly - 1-2 sets - 10 reps - Heel Raises with Counter Support  - 1 x daily - 5 x weekly - 1-2 sets - 10 reps - Standing Hip Abduction with Counter Support  - 1 x daily - 5 x weekly - 1-2 sets - 10 reps  GOALS: Goals reviewed with patient? Yes  SHORT TERM GOALS: Target date: 12/23/2023  Pt will be independent with initial HEP in order to improve strength and balance in order to decrease fall risk and improve function at home for ADL's. Baseline: Issued initially 11/25/23 Goal status: INITIAL  2.  Assess FGA and update LTG as needed. Baseline: TBA Goal status: INITIAL  3.  Patient will tolerate 5 seconds of single leg stance without loss of balance B LE's to improve ability to get in and out of shower safely. Baseline: 2.78 seconds R LE and 3.43 seconds L LE on Eval Goal status: INITIAL  4. Patient will be compliant to formal walking program >/= 3 days per week to improve aerobic tolerance and ambulatory mechanics.  LONG TERM GOALS: Target date: 01/06/2024  Pt will be independent with final HEP in order to improve strength and balance in order to decrease fall risk and improve function at home for ADL's. Baseline: Issued initially 11/25/23 Goal status: INITIAL  2.  Pt will decrease 5 Time Sit to Stand by at least 3 seconds or more in order to demonstrate clinically significant improvement in  LE strength. Baseline: 17.28 seconds on Eval Goal status: INITIAL  3.  Pt will increase by at least 0.13 m/s in order to demonstrate clinically significant improvement in community ambulation.  Baseline: 0.877 m/sec on Eval Goal status: INITIAL  4.  Patient will increase Functional Gait Assessment score to >22/30 as to reduce fall risk and improve dynamic gait safety with community ambulation. Baseline: TBA Goal status: INITIAL  5.  Pt will demonstrate improved B LE foot clearance for safety with gait. Baseline: decreased B LE step length/foot clearance on Eval Goal status: INITIAL  ASSESSMENT:  CLINICAL IMPRESSION: Patient is an 89 y.o. male who was seen today for physical therapy evaluation and treatment for CVA.  Patient presents with generalized weakness, impaired aerobic endurance, h/o fall, balance impairments, and impaired functional mobility. These impairments are limiting patient from daily activities/ADL's and community activities.  Evaluation included the following assessment tools: 5 time sit to stand, , and SLS.  Pt scored 17.28 seconds on the 5 time sit to stand test indicating pt is at increased risk of falls.  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  Pt also able to stand about 2 seconds on R LE and about 3 seconds on L LE for single leg stance concerning for fall risk.  Patient will benefit from skilled PT to address noted impairments, improve overall function, and progress towards long term goals.  OBJECTIVE IMPAIRMENTS: Abnormal gait, cardiopulmonary status limiting activity, decreased activity tolerance, decreased balance, decreased coordination, decreased endurance, decreased knowledge of condition, decreased knowledge of use of DME, decreased mobility, difficulty walking, decreased strength, and postural dysfunction.   ACTIVITY LIMITATIONS: carrying, lifting, standing, squatting, stairs,  transfers, toileting, dressing, and locomotion level  PARTICIPATION LIMITATIONS: cleaning, laundry, driving, shopping, and community activity  PERSONAL FACTORS: Age, Fitness, Past/current experiences, and 1-2 comorbidities: CAD s/p CABG, htn, PSVT are also affecting patient's functional outcome.   REHAB POTENTIAL: Good  CLINICAL DECISION MAKING: Evolving/moderate complexity  EVALUATION COMPLEXITY: Moderate  PLAN:  PT FREQUENCY: 2x/week  PT DURATION: 6 weeks  PLANNED INTERVENTIONS: [id number]- PT Re-evaluation, 97750- Physical Performance Testing, 97110-Therapeutic exercises, 97530- Therapeutic activity, [id number]- Neuromuscular re-education, 97535- Self Care, [id number]- Manual therapy, [id number]- Gait training, [phone]- Orthotic Initial, [phone]- Orthotic/Prosthetic subsequent, [phone]- Aquatic Therapy, [phone]- Electrical stimulation (manual), [phone]- Ultrasound, Patient/Family education, Balance training, Stair training, Taping, Joint mobilization, DME instructions, Cryotherapy, and Moist heat  PLAN FOR NEXT SESSION: Assess FGA; check on HEP (and progress?); issue walking program; balance; increase B LE foot clearance   Abem Shaddix, PT 11/25/2023, 8:00 PM

## 2023-11-24 NOTE — Patient Instructions (Signed)
 Check labs  Continue aspirin  and plavix until Saturday, then stop aspirin .  Continue plavix 75mg  daily.

## 2023-11-25 ENCOUNTER — Ambulatory Visit: Attending: Internal Medicine | Admitting: Physical Therapy

## 2023-11-25 ENCOUNTER — Other Ambulatory Visit: Payer: Self-pay

## 2023-11-25 ENCOUNTER — Ambulatory Visit: Payer: Self-pay | Admitting: Neurology

## 2023-11-25 ENCOUNTER — Encounter: Payer: Self-pay | Admitting: Physical Therapy

## 2023-11-25 VITALS — BP 154/83 | HR 67

## 2023-11-25 DIAGNOSIS — Z9181 History of falling: Secondary | ICD-10-CM | POA: Insufficient documentation

## 2023-11-25 DIAGNOSIS — R2689 Other abnormalities of gait and mobility: Secondary | ICD-10-CM | POA: Diagnosis present

## 2023-11-25 DIAGNOSIS — I639 Cerebral infarction, unspecified: Secondary | ICD-10-CM | POA: Insufficient documentation

## 2023-12-01 ENCOUNTER — Ambulatory Visit: Attending: Internal Medicine | Admitting: Physical Therapy

## 2023-12-01 ENCOUNTER — Encounter: Payer: Self-pay | Admitting: Physical Therapy

## 2023-12-01 DIAGNOSIS — R2689 Other abnormalities of gait and mobility: Secondary | ICD-10-CM | POA: Insufficient documentation

## 2023-12-01 DIAGNOSIS — Z9181 History of falling: Secondary | ICD-10-CM | POA: Insufficient documentation

## 2023-12-01 DIAGNOSIS — I639 Cerebral infarction, unspecified: Secondary | ICD-10-CM | POA: Insufficient documentation

## 2023-12-01 NOTE — Patient Instructions (Addendum)
 Access Code: [email or web address] URL: [email or web address] Date: 12/01/2023 Prepared by: Daved Bull  Exercises - Standing March with Counter Support  - 1 x daily - 5 x weekly - 1-2 sets - 10 reps - Heel Raises with Counter Support  - 1 x daily - 5 x weekly - 1-2 sets - 10 reps - Standing Hip Abduction with Counter Support  - 1 x daily - 5 x weekly - 1-2 sets - 10 reps - Backward Walking with Counter Support  - 1 x daily - 5 x weekly - 3 sets - 10 reps - Walking with Eyes Closed and Counter Support  - 1 x daily - 5 x weekly - 3 sets - 10 reps - Tandem Walking with Counter Support  - 1 x daily - 5 x weekly - 3 sets - 10 reps - Walking with Head Rotation  - 1 x daily - 5 x weekly - 3 sets - 10 reps  You Can Walk For A Certain Length Of Time Each Day                          Walk 5 minutes 1-2 times per day.             Increase 1-2  minutes every week             Work up to 20 minutes (1x per day).

## 2023-12-01 NOTE — Therapy (Signed)
 OUTPATIENT PHYSICAL THERAPY NEURO TREATMENT   Patient Name: Robert Pacheco MRN: [id number] DOB:Sep 02, 1933, 89 y.o., male Today's Date: 12/01/2023   PCP: [name], DO REFERRING PROVIDER: Raenelle Donalda HERO, MD  END OF SESSION:  PT End of Session - 12/01/23 1624     Visit Number 2    Number of Visits 13   12 plus Eval   Date for Recertification  01/22/24   for scheduling delays   Authorization Type [employer]    Progress Note Due on Visit 10    PT Start Time 1619    PT Stop Time 1700    PT Time Calculation (min) 41 min    Equipment Utilized During Treatment Gait belt    Activity Tolerance Patient tolerated treatment well    Behavior During Therapy [facility] for tasks assessed/performed          Past Medical History:  Diagnosis Date   Allergy    Coronary artery disease    Hyperlipidemia    Hypertension    Personal history of colonic adenomas 05/10/2012   Skin cancer, basal cell    Past Surgical History:  Procedure Laterality Date   CATARACT EXTRACTION     COLONOSCOPY  multiple   CORONARY ARTERY BYPASS GRAFT  2000   EYE SURGERY     HERNIA REPAIR     MOHS SURGERY     Patient Active Problem List   Diagnosis Date Noted   Transaminitis 11/06/2023   History of TIA (transient ischemic attack) 11/05/2023   Right sided weakness 11/05/2023   Facial droop 11/05/2023   History of atrial tachycardia 11/05/2023   History of CAD (coronary artery disease) 11/05/2023   Acute CVA (cerebrovascular accident) (HCC) 11/05/2023   Elevated liver enzymes 11/05/2023   PSVT (paroxysmal supraventricular tachycardia) 03/27/2021   Senile purpura 09/26/2020   Bilateral leg edema 07/11/2020   Dizziness 07/11/2020   Coronary artery disease involving native coronary artery of native heart without angina pectoris 07/11/2020   Mixed hyperlipidemia 07/11/2020   Allergy 07/10/2020   Lightheadedness 04/23/2020   Lumbar radiculopathy 08/08/2019   Greater trochanteric pain syndrome of  right lower extremity 04/18/2019   Constipation 04/18/2019   Bilateral lower extremity edema 10/15/2018   Displacement of intraocular lens 06/16/2017   Combined forms of age-related cataract of left eye 03/09/2014   Regular astigmatism 03/09/2014   Family history of colon cancer 10/01/2012   History of colonic polyps 05/10/2012   Personal history of colonic adenomas 05/10/2012   BPH (benign prostatic hyperplasia) 03/27/2009   DYSPLASTIC NEVUS, FACE 10/05/2008   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  States he has been doing his HEP from eval without issues.  He states he is light headed in mornings, but this is pretty well cleared up by this time of day and he is having no issues currently.  He ambulates independently without AD. Pt accompanied by: self and family member (daughter stayed in waiting room)  PERTINENT HISTORY: Hospitalization 11/04/23 to 11/07/23; pt with R facial droop, dysphagia, and R sided weakness; found to have acute L basal ganglia CVA.  ED visit 11/19/24 d/t sudden onset numbness B UE's/LE's (sx's then resolved; no motor symptoms); imaging showing significant DDD with spinal cord mass effect--recommended f/u OP with Neurosurgery; no collar necessary per MD [name].  Per follow-up  Neurology note from DO [facility] 11/24/23 pt continues to have spells of numbness involving the arms and leg which occurs a few times per week. Symptoms are not constant. He denies arm/leg weakness or imbalance. He has some neck discomfort, which is worse at night time. He also mentions mild numbness around the lips on both side of the mouth. No problems with speech or swallow.   PMH includes CAD s/p CABG, htn, PSVT, HLD.  PAIN:  Are you having pain? No  PRECAUTIONS: Fall; C-spine imaging showing significant DDD with spinal cord mass effect  RED FLAGS: Cervical red flags: Dysphagia No, Dysmetria No, Diplopia No, Nystagmus No, and Nausea No   WEIGHT BEARING RESTRICTIONS: No  FALLS: Has patient fallen in last 6 months? Yes. Number of falls 1 (fell between bed and end table; skin tear to L UE)  LIVING ENVIRONMENT: Lives with: lives alone Lives in: House/apartment Stairs: Yes: External: 2 steps; none Has following equipment at home: Single point cane, Walker - 2 wheeled, shower chair, and Grab bars.  PLOF: Independent.  Hasn't drove lately (waiting until has more energy back).  Mostly uses walker in bedroom.  PATIENT GOALS: Be able to move a little better.  Improve leg strength.  OBJECTIVE:  Note: Objective measures were completed at Evaluation unless otherwise noted.  DIAGNOSTIC FINDINGS:  MRI Brain 11/05/23: Small acute perforator infarct in the left basal ganglia.   MRI c-spine 11/20/23: 1. Multifactorial degenerative cervical spinal stenosis WITH spinal cord mass effect from C2-C3 through C6-C7. No spinal cord edema or myelomalacia identified. 2. 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 3. Degenerative severe right C4 and C7 nerve level foraminal stenosis, moderate at the left C5, right C6, left C7 levels.  COGNITION: Overall cognitive status: Within functional limits for tasks assessed   SENSATION: Intact B LE  light touch (pt with self reported decreased sensation below knees to feet B)  COORDINATION: Mild decreased B rapid alternating toe tapping Edema: L LE more edematous than R LE   MUSCLE TONE: Intact B LE's  Edema: 28.8 cm L LE malleolar line    30 cm R LE malleolar line    Chronic LE edema per pt report  POSTURE: rounded shoulders, forward head, and posterior pelvic tilt  LOWER EXTREMITY ROM:     Active  Right Eval Left Eval  Hip flexion WNL WNL  Hip extension    Hip abduction    Hip adduction    Hip internal rotation    Hip external rotation    Knee flexion WNL WNL  Knee extension WNL WNL  Ankle dorsiflexion WNL WNL  Ankle plantarflexion WNL WNL  Ankle inversion    Ankle eversion     (  Blank rows = not tested)  LOWER EXTREMITY MMT:    MMT Right Eval Left Eval  Hip flexion 5/5 4+/5  Hip extension    Hip abduction    Hip adduction    Hip internal rotation    Hip external rotation    Knee flexion 5/5 4+/5  Knee extension 5/5 4+/5  Ankle dorsiflexion At least 3/5 AROM At least 3/5 AROM  Ankle plantarflexion    Ankle inversion    Ankle eversion    (Blank rows = not tested)  TRANSFERS: Use of B UE's Sit to stand: Modified independence  Assistive device utilized: None     Stand to sit: Modified independence  Assistive device utilized: None      GAIT: Findings: Mild flexed posture; decreased B LE step length/foot clearance;  decreased B LE heel-strike (more foot flat); decreased R UE (compared to L UE) arm swing; clinic distances; SBA  FUNCTIONAL TESTS:  5 times sit to stand: 17.28 seconds; B UE support 10 meter walk test: 0.877 m/sec (11.40 seconds; no AD use) SLS: 2.78 seconds R LE; 3.43 seconds L LE  PATIENT SURVEYS:  ABC Scale: TBA                                                                                                                             TREATMENT DATE: 12/01/23    [facility] PT Assessment - 12/01/23 1628       Functional Gait  Assessment    Gait assessed  Yes    Gait Level Surface Walks 20 ft in less than 7 sec but greater than 5.5 sec, uses assistive device, slower speed, mild gait deviations, or deviates 6-10 in outside of the 12 in walkway width.    Change in Gait Speed Able to change speed, demonstrates mild gait deviations, deviates 6-10 in outside of the 12 in walkway width, or no gait deviations, unable to achieve a major change in velocity, or uses a change in velocity, or uses an assistive device.    Gait with Horizontal Head Turns Performs head turns smoothly with slight change in gait velocity (eg, minor disruption to smooth gait path), deviates 6-10 in outside 12 in walkway width, or uses an assistive device.    Gait with Vertical Head Turns Performs task with slight change in gait velocity (eg, minor disruption to smooth gait path), deviates 6 - 10 in outside 12 in walkway width or uses assistive device    Gait and Pivot Turn Pivot turns safely in greater than 3 sec and stops with no loss of balance, or pivot turns safely within 3 sec and stops with mild imbalance, requires small steps to catch balance.    Step Over Obstacle Is able to step over one shoe box (4.5 in total height) but must slow down and adjust steps to clear box safely. May require verbal cueing.    Gait with Narrow Base of Support Ambulates less than 4 steps heel  to toe or cannot perform without assistance.    Gait with Eyes Closed Walks 20 ft, slow speed, abnormal gait pattern, evidence for imbalance, deviates 10-15 in outside 12 in walkway width. Requires more than 9 sec to ambulate 20 ft.    Ambulating Backwards Walks 20 ft, slow speed, abnormal gait pattern, evidence for imbalance, deviates 10-15 in outside 12 in walkway width.    Steps Alternating feet, must use rail.    Total Score 15    FGA comment: 15/30 = significant fall risk         At countertop w/ unilateral support (SBA): -Backwards walking 6x10 ft -Tandem forward walking 6x10  ft -Forward walking w/ eyes closed 6x10 ft -Walking w/ head turns (eyes open) 6x10 ft  -SciFit using BUE/BLE attempting to progress to level 3.0 over 5 minutes, but pt fatigues requiring return to level 1.0 about 4 minutes into task able to maintain for remaining time.  Performed for endurance challenge and large amplitude reciprocal mobility.  You Can Walk For A Certain Length Of Time Each Day                          Walk 5 minutes 1-2 times per day.             Increase 1-2  minutes every week             Work up to 20 minutes (1x per day).  PATIENT EDUCATION: Education details: Outcome interpretation w/ goal set.  Additions to HEP.  Walking program - can work up to everyday as tolerated. Person educated: Patient Education method: Explanation, Demonstration, Verbal cues, and Handouts Education comprehension: verbalized understanding, returned demonstration, verbal cues required, and needs further education  HOME EXERCISE PROGRAM: Access Code: [email or web address] URL: [email or web address] Date: 11/25/2023 Prepared by: Damien Caulk  Exercises - Standing March with Counter Support  - 1 x daily - 5 x weekly - 1-2 sets - 10 reps - Heel Raises with Counter Support  - 1 x daily - 5 x weekly - 1-2 sets - 10 reps - Standing Hip Abduction with Counter Support  - 1 x daily - 5 x weekly - 1-2 sets - 10 reps - Backward Walking with Counter Support  - 1 x daily - 5 x weekly - 3 sets - 10 reps - Walking with Eyes Closed and Counter Support  - 1 x daily - 5 x weekly - 3 sets - 10 reps - Tandem Walking with Counter Support  - 1 x daily - 5 x weekly - 3 sets - 10 reps - Walking with Head Rotation  - 1 x daily - 5 x weekly - 3 sets - 10 reps  GOALS: Goals reviewed with patient? Yes  SHORT TERM GOALS: Target date: 12/23/2023  Pt will be independent with initial HEP in order to improve strength and balance in order to decrease fall risk and improve function at home for ADL's. Baseline:  Issued initially 11/25/23 Goal status: INITIAL  2.  Assess FGA and update LTG as needed. Baseline: 15/30 (11/4) Goal status: MET  3.  Patient will tolerate 5 seconds of single leg stance without loss of balance B LE's to improve ability to get in and out of shower safely. Baseline: 2.78 seconds R LE and 3.43 seconds L LE on Eval Goal status: INITIAL  4. Patient will be compliant to formal walking program >/= 3 days per week to improve aerobic tolerance and ambulatory  mechanics.   LONG TERM GOALS: Target date: 01/06/2024  Pt will be independent with final HEP in order to improve strength and balance in order to decrease fall risk and improve function at home for ADL's. Baseline: Issued initially 11/25/23 Goal status: INITIAL  2.  Pt will decrease 5 Time Sit to Stand by at least 3 seconds or more in order to demonstrate clinically significant improvement in LE strength. Baseline: 17.28 seconds on Eval Goal status: INITIAL  3.  Pt will increase by at least 0.13 m/s in order to demonstrate clinically significant improvement in community ambulation.  Baseline: 0.877 m/sec on Eval Goal status: INITIAL  4.  Patient will increase Functional Gait Assessment score to >22/30 as to reduce fall risk and improve dynamic gait safety with community ambulation. Baseline: 15/30 (11/4) Goal status: INITIAL  5.  Pt will demonstrate improved B LE foot clearance for safety with gait. Baseline: decreased B LE step length/foot clearance on Eval Goal status: INITIAL  ASSESSMENT:  CLINICAL IMPRESSION: Captured FGA not able to be collected on evaluation with pt score of 15/30 indicating elevated dynamic fall risk.  Per pt and daughter report he does walk on treadmill some days but much slower than he used to so walking program established today to provide some progression guidance to enhance endurance training.  Made additions to HEP to reflect some of the challenge on the FGA including backwards  ambulation, visually limited conditions, and narrowed BOS.  He would do well to work on stride lengthening and obstacle management to improve efficiency and dynamics of gait.  Continue per POC.  OBJECTIVE IMPAIRMENTS: Abnormal gait, cardiopulmonary status limiting activity, decreased activity tolerance, decreased balance, decreased coordination, decreased endurance, decreased knowledge of condition, decreased knowledge of use of DME, decreased mobility, difficulty walking, decreased strength, and postural dysfunction.   ACTIVITY LIMITATIONS: carrying, lifting, standing, squatting, stairs, transfers, toileting, dressing, and locomotion level  PARTICIPATION LIMITATIONS: cleaning, laundry, driving, shopping, and community activity  PERSONAL FACTORS: Age, Fitness, Past/current experiences, and 1-2 comorbidities: CAD s/p CABG, htn, PSVT are also affecting patient's functional outcome.   REHAB POTENTIAL: Good  CLINICAL DECISION MAKING: Evolving/moderate complexity  EVALUATION COMPLEXITY: Moderate  PLAN:  PT FREQUENCY: 2x/week  PT DURATION: 6 weeks  PLANNED INTERVENTIONS: [id number]- PT Re-evaluation, 97750- Physical Performance Testing, 97110-Therapeutic exercises, 97530- Therapeutic activity, [id number]- Neuromuscular re-education, 97535- Self Care, [id number]- Manual therapy, [id number]- Gait training, [phone]- Orthotic Initial, [phone]- Orthotic/Prosthetic subsequent, [phone]- Aquatic Therapy, [phone]- Electrical stimulation (manual), [phone]- Ultrasound, Patient/Family education, Balance training, Stair training, Taping, Joint mobilization, DME instructions, Cryotherapy, and Moist heat  PLAN FOR NEXT SESSION: check on HEP/walking program (and progress?); balance; increase B LE foot clearance; obstacle clearance, backwards gait, visually limited conditions   Daved KATHEE Bull, PT, DPT 12/01/2023, 5:26 PM

## 2023-12-03 ENCOUNTER — Encounter: Payer: Self-pay | Admitting: Physical Therapy

## 2023-12-03 ENCOUNTER — Ambulatory Visit: Admitting: Physical Therapy

## 2023-12-03 DIAGNOSIS — R2689 Other abnormalities of gait and mobility: Secondary | ICD-10-CM

## 2023-12-03 DIAGNOSIS — Z9181 History of falling: Secondary | ICD-10-CM

## 2023-12-03 NOTE — Therapy (Signed)
 OUTPATIENT PHYSICAL THERAPY NEURO TREATMENT   Patient Name: Robert Pacheco MRN: [id number] DOB:January 17, 1934, 89 y.o., male Today's Date: 12/03/2023   PCP: [name], DO REFERRING PROVIDER: Raenelle Donalda HERO, MD  END OF SESSION:  PT End of Session - 12/03/23 1412     Visit Number 3    Number of Visits 13   12 plus Eval   Date for Recertification  01/22/24   for scheduling delays   Authorization Type [employer]    Progress Note Due on Visit 10    PT Start Time 1406   pt in restroom at onset of session   PT Stop Time 1445    PT Time Calculation (min) 39 min    Equipment Utilized During Treatment Gait belt    Activity Tolerance Patient tolerated treatment well    Behavior During Therapy [facility] for tasks assessed/performed          Past Medical History:  Diagnosis Date   Allergy    Coronary artery disease    Hyperlipidemia    Hypertension    Personal history of colonic adenomas 05/10/2012   Skin cancer, basal cell    Past Surgical History:  Procedure Laterality Date   CATARACT EXTRACTION     COLONOSCOPY  multiple   CORONARY ARTERY BYPASS GRAFT  2000   EYE SURGERY     HERNIA REPAIR     MOHS SURGERY     Patient Active Problem List   Diagnosis Date Noted   Transaminitis 11/06/2023   History of TIA (transient ischemic attack) 11/05/2023   Right sided weakness 11/05/2023   Facial droop 11/05/2023   History of atrial tachycardia 11/05/2023   History of CAD (coronary artery disease) 11/05/2023   Acute CVA (cerebrovascular accident) (HCC) 11/05/2023   Elevated liver enzymes 11/05/2023   PSVT (paroxysmal supraventricular tachycardia) 03/27/2021   Senile purpura 09/26/2020   Bilateral leg edema 07/11/2020   Dizziness 07/11/2020   Coronary artery disease involving native coronary artery of native heart without angina pectoris 07/11/2020   Mixed hyperlipidemia 07/11/2020   Allergy 07/10/2020   Lightheadedness 04/23/2020   Lumbar radiculopathy 08/08/2019    Greater trochanteric pain syndrome of right lower extremity 04/18/2019   Constipation 04/18/2019   Bilateral lower extremity edema 10/15/2018   Displacement of intraocular lens 06/16/2017   Combined forms of age-related cataract of left eye 03/09/2014   Regular astigmatism 03/09/2014   Family history of colon cancer 10/01/2012   History of colonic polyps 05/10/2012   Personal history of colonic adenomas 05/10/2012   BPH (benign prostatic hyperplasia) 03/27/2009   DYSPLASTIC NEVUS, FACE 10/05/2008   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  He reports he checks his BP at home and has not noted any recent issues. He ambulates independently without AD. Pt accompanied by: self and family member (daughter stayed in waiting room)  PERTINENT HISTORY: Hospitalization 11/04/23 to 11/07/23; pt with R facial droop, dysphagia, and R sided weakness; found to have acute L basal ganglia CVA.  ED visit 11/19/24 d/t sudden onset numbness B UE's/LE's (sx's then resolved; no motor symptoms); imaging showing significant DDD with spinal cord mass effect--recommended f/u OP with Neurosurgery; no collar necessary per MD [name].  Per follow-up Neurology note from DO [facility] 11/24/23 pt continues to have spells of numbness involving the arms and leg which occurs a few times per  week. Symptoms are not constant. He denies arm/leg weakness or imbalance. He has some neck discomfort, which is worse at night time. He also mentions mild numbness around the lips on both side of the mouth. No problems with speech or swallow.   PMH includes CAD s/p CABG, htn, PSVT, HLD.  PAIN:  Are you having pain? No  PRECAUTIONS: Fall; C-spine imaging showing significant DDD with spinal cord mass effect  RED FLAGS: Cervical red flags: Dysphagia No, Dysmetria No, Diplopia No, Nystagmus No, and Nausea No   WEIGHT BEARING RESTRICTIONS: No  FALLS: Has patient fallen in last 6 months? Yes. Number of falls 1 (fell between bed and end table; skin tear to L UE)  LIVING ENVIRONMENT: Lives with: lives alone Lives in: House/apartment Stairs: Yes: External: 2 steps; none Has following equipment at home: Single point cane, Walker - 2 wheeled, shower chair, and Grab bars.  PLOF: Independent.  Hasn't drove lately (waiting until has more energy back).  Mostly uses walker in bedroom.  PATIENT GOALS: Be able to move a little better.  Improve leg strength.  OBJECTIVE:  Note: Objective measures were completed at Evaluation unless otherwise noted.  DIAGNOSTIC FINDINGS:  MRI Brain 11/05/23: Small acute perforator infarct in the left basal ganglia.   MRI c-spine 11/20/23: 1. Multifactorial degenerative cervical spinal stenosis WITH spinal cord mass effect from C2-C3 through C6-C7. No spinal cord edema or myelomalacia identified. 2. 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 3. Degenerative severe right C4 and C7 nerve level foraminal stenosis, moderate at the left C5, right C6, left C7 levels.  COGNITION: Overall cognitive status: Within functional limits for tasks assessed   SENSATION: Intact B LE light touch (pt with self reported decreased sensation below knees to feet B)  COORDINATION: Mild decreased B rapid alternating toe  tapping Edema: L LE more edematous than R LE   MUSCLE TONE: Intact B LE's  Edema: 28.8 cm L LE malleolar line    30 cm R LE malleolar line    Chronic LE edema per pt report  POSTURE: rounded shoulders, forward head, and posterior pelvic tilt  LOWER EXTREMITY ROM:     Active  Right Eval Left Eval  Hip flexion WNL WNL  Hip extension    Hip abduction    Hip adduction    Hip internal rotation    Hip external rotation    Knee flexion WNL WNL  Knee extension WNL WNL  Ankle dorsiflexion WNL WNL  Ankle plantarflexion WNL WNL  Ankle inversion    Ankle eversion     (Blank rows = not tested)  LOWER EXTREMITY MMT:    MMT Right Eval Left Eval  Hip flexion 5/5  4+/5  Hip extension    Hip abduction    Hip adduction    Hip internal rotation    Hip external rotation    Knee flexion 5/5 4+/5  Knee extension 5/5 4+/5  Ankle dorsiflexion At least 3/5 AROM At least 3/5 AROM  Ankle plantarflexion    Ankle inversion    Ankle eversion    (Blank rows = not tested)  TRANSFERS: Use of B UE's Sit to stand: Modified independence  Assistive device utilized: None     Stand to sit: Modified independence  Assistive device utilized: None      GAIT: Findings: Mild flexed posture; decreased B LE step length/foot clearance;  decreased B LE heel-strike (more foot flat); decreased R UE (compared to L UE) arm swing; clinic distances; SBA  FUNCTIONAL TESTS:  5 times sit to stand: 17.28 seconds; B UE support 10 meter walk test: 0.877 m/sec (11.40 seconds; no AD use) SLS: 2.78 seconds R LE; 3.43 seconds L LE  PATIENT SURVEYS:  ABC Scale: TBA                                                                                                                             TREATMENT DATE: 12/03/23  -Backwards walking progressing to no UE support 8x8 ft cued for increased step length and width of BOS -Forward > backward tandem on foam beam progressing to unilateral UE support 4x10 ft each  direction -Lateral stepping on foam beam progressing to no UE support SBA 6x10 ft -Forward and lateral stepping over 4-8 hurdles progressing to unilateral UE support over 6x8 ft each direction, cues to improve motor planning and hip engagement -6 step taps unsupported x10 each LE > step taps to 3rd step working on hip flexor engagement on return to floor (pt tends to drag foot off step) x5 each LE w/ BUE support -SciFit using BUE/BLE up to level 2.0 over 5 minutes for endurance challenge and large amplitude reciprocal mobility.  PATIENT EDUCATION: Education details: Continue HEP and working towards walking program. Person educated: Patient Education method: Programmer, Multimedia, Facilities Manager, Verbal cues, and Handouts Education comprehension: verbalized understanding, returned demonstration, verbal cues required, and needs further education  HOME EXERCISE PROGRAM: Access Code: [email or web address] URL: [email or web address] Date: 11/25/2023 Prepared by: Damien Caulk  Exercises - Standing March with Counter Support  - 1 x daily - 5 x weekly - 1-2 sets - 10 reps - Heel Raises with Counter Support  - 1 x daily - 5 x weekly - 1-2 sets - 10 reps - Standing Hip Abduction with Counter Support  - 1 x daily - 5 x weekly - 1-2 sets - 10 reps - Backward Walking with Counter Support  - 1 x daily - 5 x weekly - 3 sets - 10 reps - Walking with Eyes Closed and Counter Support  - 1 x daily - 5 x weekly - 3 sets - 10 reps - Tandem Walking with Counter Support  -  1 x daily - 5 x weekly - 3 sets - 10 reps - Walking with Head Rotation  - 1 x daily - 5 x weekly - 3 sets - 10 reps  You Can Walk For A Certain Length Of Time Each Day                          Walk 5 minutes 1-2 times per day.             Increase 1-2  minutes every week             Work up to 20 minutes (1x per day).  GOALS: Goals reviewed with patient? Yes  SHORT TERM GOALS: Target date: 12/23/2023  Pt will be independent with initial  HEP in order to improve strength and balance in order to decrease fall risk and improve function at home for ADL's. Baseline: Issued initially 11/25/23 Goal status: INITIAL  2.  Assess FGA and update LTG as needed. Baseline: 15/30 (11/4) Goal status: MET  3.  Patient will tolerate 5 seconds of single leg stance without loss of balance B LE's to improve ability to get in and out of shower safely. Baseline: 2.78 seconds R LE and 3.43 seconds L LE on Eval Goal status: INITIAL  4. Patient will be compliant to formal walking program >/= 3 days per week to improve aerobic tolerance and ambulatory mechanics.   LONG TERM GOALS: Target date: 01/06/2024  Pt will be independent with final HEP in order to improve strength and balance in order to decrease fall risk and improve function at home for ADL's. Baseline: Issued initially 11/25/23 Goal status: INITIAL  2.  Pt will decrease 5 Time Sit to Stand by at least 3 seconds or more in order to demonstrate clinically significant improvement in LE strength. Baseline: 17.28 seconds on Eval Goal status: INITIAL  3.  Pt will increase by at least 0.13 m/s in order to demonstrate clinically significant improvement in community ambulation.  Baseline: 0.877 m/sec on Eval Goal status: INITIAL  4.  Patient will increase Functional Gait Assessment score to >22/30 as to reduce fall risk and improve dynamic gait safety with community ambulation. Baseline: 15/30 (11/4) Goal status: INITIAL  5.  Pt will demonstrate improved B LE foot clearance for safety with gait. Baseline: decreased B LE step length/foot clearance on Eval Goal status: INITIAL  ASSESSMENT:  CLINICAL IMPRESSION: Focus of skilled PT session today on static and dynamic stability.  He is challenged by motor planning and terminal hip flexor engagement which is impacting his BOS in standing and with ambulation.  He continues to benefit from skilled PT to progress his endurance as he  continues to fatigue w/ 5-6 minutes of continuous activity.  Continue per POC.  OBJECTIVE IMPAIRMENTS: Abnormal gait, cardiopulmonary status limiting activity, decreased activity tolerance, decreased balance, decreased coordination, decreased endurance, decreased knowledge of condition, decreased knowledge of use of DME, decreased mobility, difficulty walking, decreased strength, and postural dysfunction.   ACTIVITY LIMITATIONS: carrying, lifting, standing, squatting, stairs, transfers, toileting, dressing, and locomotion level  PARTICIPATION LIMITATIONS: cleaning, laundry, driving, shopping, and community activity  PERSONAL FACTORS: Age, Fitness, Past/current experiences, and 1-2 comorbidities: CAD s/p CABG, htn, PSVT are also affecting patient's functional outcome.   REHAB POTENTIAL: Good  CLINICAL DECISION MAKING: Evolving/moderate complexity  EVALUATION COMPLEXITY: Moderate  PLAN:  PT FREQUENCY: 2x/week  PT DURATION: 6 weeks  PLANNED INTERVENTIONS: 02835- PT Re-evaluation, 97750- Physical Performance Testing,  97110-Therapeutic exercises, 97530- Therapeutic activity, [id number]- Neuromuscular re-education, [phone]- Self Care, [id number]- Manual therapy, [id number]- Gait training, [phone]- Orthotic Initial, [phone]- Orthotic/Prosthetic subsequent, [phone]- Aquatic Therapy, [phone]- Electrical stimulation (manual), [phone]- Ultrasound, Patient/Family education, Balance training, Stair training, Taping, Joint mobilization, DME instructions, Cryotherapy, and Moist heat  PLAN FOR NEXT SESSION: check on HEP/walking program (and progress?); balance; increase B LE foot clearance; obstacle clearance, backwards gait, visually limited conditions, compliant surfaces, blaze pods, terminal hip flexor engagement   Daved KATHEE Bull, PT, DPT 12/03/2023, 2:43 PM

## 2023-12-08 ENCOUNTER — Ambulatory Visit: Admitting: Physical Therapy

## 2023-12-08 ENCOUNTER — Encounter: Payer: Self-pay | Admitting: Physical Therapy

## 2023-12-08 VITALS — BP 148/73 | HR 66

## 2023-12-08 DIAGNOSIS — R2689 Other abnormalities of gait and mobility: Secondary | ICD-10-CM

## 2023-12-08 DIAGNOSIS — I639 Cerebral infarction, unspecified: Secondary | ICD-10-CM

## 2023-12-08 DIAGNOSIS — Z9181 History of falling: Secondary | ICD-10-CM

## 2023-12-08 NOTE — Therapy (Signed)
 OUTPATIENT PHYSICAL THERAPY NEURO TREATMENT   Patient Name: Robert Pacheco MRN: [id number] DOB:Sep 28, 1933, 89 y.o., male Today's Date: 12/08/2023   PCP: [name], DO REFERRING PROVIDER: Raenelle Donalda HERO, MD  END OF SESSION:  PT End of Session - 12/08/23 1536     Visit Number 4    Number of Visits 13   12 plus Eval   Date for Recertification  01/22/24   for scheduling delays   Authorization Type [facility] Medicare    Authorization Time Period 11/25/23 - 01/20/24    Authorization - Visit Number 4    Authorization - Number of Visits 6    Progress Note Due on Visit 10    PT Start Time 1535    PT Stop Time 1615    PT Time Calculation (min) 40 min    Equipment Utilized During Treatment Gait belt    Activity Tolerance Patient tolerated treatment well    Behavior During Therapy [facility] for tasks assessed/performed          Past Medical History:  Diagnosis Date   Allergy    Coronary artery disease    Hyperlipidemia    Hypertension    Personal history of colonic adenomas 05/10/2012   Skin cancer, basal cell    Past Surgical History:  Procedure Laterality Date   CATARACT EXTRACTION     COLONOSCOPY  multiple   CORONARY ARTERY BYPASS GRAFT  2000   EYE SURGERY     HERNIA REPAIR     MOHS SURGERY     Patient Active Problem List   Diagnosis Date Noted   Transaminitis 11/06/2023   History of TIA (transient ischemic attack) 11/05/2023   Right sided weakness 11/05/2023   Facial droop 11/05/2023   History of atrial tachycardia 11/05/2023   History of CAD (coronary artery disease) 11/05/2023   Acute CVA (cerebrovascular accident) (HCC) 11/05/2023   Elevated liver enzymes 11/05/2023   PSVT (paroxysmal supraventricular tachycardia) 03/27/2021   Senile purpura 09/26/2020   Bilateral leg edema 07/11/2020   Dizziness 07/11/2020   Coronary artery disease involving native coronary artery of native heart without angina pectoris 07/11/2020   Mixed hyperlipidemia 07/11/2020    Allergy 07/10/2020   Lightheadedness 04/23/2020   Lumbar radiculopathy 08/08/2019   Greater trochanteric pain syndrome of right lower extremity 04/18/2019   Constipation 04/18/2019   Bilateral lower extremity edema 10/15/2018   Displacement of intraocular lens 06/16/2017   Combined forms of age-related cataract of left eye 03/09/2014   Regular astigmatism 03/09/2014   Family history of colon cancer 10/01/2012   History of colonic polyps 05/10/2012   Personal history of colonic adenomas 05/10/2012   BPH (benign prostatic hyperplasia) 03/27/2009   DYSPLASTIC NEVUS, FACE 10/05/2008   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  No current symptoms.  No recent falls and no pain reported.  Pt reports checking BP at different times of the day (even in morning when having symptoms) and BP is pretty good each time.  Independent ambulating without AD.  Not doing walking program but is doing HEP.  Is using treadmill 2-3x's/week for 5-10 minutes each time. Pt accompanied by: self and family member (daughter stayed in waiting room; son in law stayed in car)  PERTINENT HISTORY: Hospitalization 11/04/23 to 11/07/23; pt with R facial droop,  dysphagia, and R sided weakness; found to have acute L basal ganglia CVA.  ED visit 11/19/24 d/t sudden onset numbness B UE's/LE's (sx's then resolved; no motor symptoms); imaging showing significant DDD with spinal cord mass effect--recommended f/u OP with Neurosurgery; no collar necessary per MD [name].  Per follow-up Neurology note from DO [facility] 11/24/23 pt continues to have spells of numbness involving the arms and leg which occurs a few times per week. Symptoms are not constant. He denies arm/leg weakness or imbalance. He has some neck discomfort, which is worse at night time. He also mentions mild numbness around the lips on both side of the mouth. No problems with speech or swallow.   PMH includes CAD s/p CABG, htn, PSVT, HLD.  PAIN:  Are you having pain? No  PRECAUTIONS: Fall; C-spine imaging showing significant DDD with spinal cord mass effect  RED FLAGS: Cervical red flags: Dysphagia No, Dysmetria No, Diplopia No, Nystagmus No, and Nausea No   WEIGHT BEARING RESTRICTIONS: No  FALLS: Has patient fallen in last 6 months? Yes. Number of falls 1 (fell between bed and end table; skin tear to L UE)  LIVING ENVIRONMENT: Lives with: lives alone Lives in: House/apartment Stairs: Yes: External: 2 steps; none Has following equipment at home: Single point cane, Walker - 2 wheeled, shower chair, and Grab bars.  PLOF: Independent.  Hasn't drove lately (waiting until has more energy back).  Mostly uses walker in bedroom.  PATIENT GOALS: Be able to move a little better.  Improve leg strength.  OBJECTIVE:  Note: Objective measures were completed at Evaluation unless otherwise noted.  DIAGNOSTIC FINDINGS:  MRI Brain 11/05/23: Small acute perforator infarct in the left basal ganglia.   MRI c-spine 11/20/23: 1. Multifactorial degenerative cervical spinal stenosis WITH spinal cord mass effect from C2-C3 through C6-C7. No spinal cord edema or myelomalacia identified. 2. Underlying Degenerative  ankylosis at C3-C4 and C4-C5, and probably developing ankylosis at C5-C6 and C6-C7. 3. Degenerative severe right C4 and C7 nerve level foraminal stenosis, moderate at the left C5, right C6, left C7 levels.  COGNITION: Overall cognitive status: Within functional limits for tasks assessed   SENSATION: Intact B LE light touch (pt with self reported decreased sensation below knees to feet B)  COORDINATION: Mild decreased B rapid alternating toe tapping Edema: L LE more edematous than R LE   MUSCLE TONE: Intact B LE's  Edema: 28.8 cm L LE malleolar line    30 cm R LE malleolar line    Chronic LE edema per pt report  POSTURE: rounded shoulders, forward head, and posterior pelvic tilt  LOWER EXTREMITY ROM:     Active  Right Eval Left Eval  Hip flexion WNL WNL  Hip extension    Hip abduction    Hip adduction    Hip internal rotation    Hip external  rotation    Knee flexion WNL WNL  Knee extension WNL WNL  Ankle dorsiflexion WNL WNL  Ankle plantarflexion WNL WNL  Ankle inversion    Ankle eversion     (Blank rows = not tested)  LOWER EXTREMITY MMT:    MMT Right Eval Left Eval  Hip flexion 5/5 4+/5  Hip extension    Hip abduction    Hip adduction    Hip internal rotation    Hip external rotation    Knee flexion 5/5 4+/5  Knee extension 5/5 4+/5  Ankle dorsiflexion At least 3/5 AROM At least 3/5 AROM  Ankle plantarflexion    Ankle inversion    Ankle eversion    (Blank rows = not tested)  TRANSFERS: Use of B UE's Sit to stand: Modified independence  Assistive device utilized: None     Stand to sit: Modified independence  Assistive device utilized: None      GAIT: Findings: Mild flexed posture; decreased B LE step length/foot clearance;  decreased B LE heel-strike (more foot flat); decreased R UE (compared to L UE) arm swing; clinic distances; SBA  FUNCTIONAL TESTS:  5 times sit to stand: 17.28 seconds; B UE support 10 meter walk test: 0.877 m/sec (11.40  seconds; no AD use) SLS: 2.78 seconds R LE; 3.43 seconds L LE  PATIENT SURVEYS:  ABC Scale: TBA                                                                                                                             TREATMENT DATE: 12/08/23  Self Care: BP and HR taken in sitting at rest beginning of session (see below for details). Vitals:   12/08/23 1543  BP: (!) 148/73  Pulse: 66   Therapeutic Exercise: SciFit level 2 for 6 minutes using BUE/BLEs for endurance challenge and large amplitude reciprocal mobility. RPE of 3-4/10 following activity. Ambulation:  x115 feet no AD with 1# B ankle weights (easy) x230 feet no AD with 2# B ankle weights; HR 84 bpm post activity  Therapeutic Activity: Toe taps: x10 reps R LE; x10 reps L LE; 6 inch step; no UE support; occasional difficulty with R LE step clearance; CGA x10 reps R LE; x10 reps L LE; 12 inch step; no UE support; more difficulty with R LE step clearance compared to L LE intermittently (pt able to safely self correct); CGA Step ups to 6 inch step x10 reps R LE; x10 reps L LE; forward (keeping foot on step); intermittent UE support for balance; more difficulty noted with L LE; CGA x10 reps R LE; x10 reps L LE; lateral (keeping foot on step); intermittent UE support for balance; more difficulty noted with L LE; CGA  PATIENT EDUCATION: Education details: Continue HEP and working towards walking program. Person educated: Patient Education method: Programmer, Multimedia, Facilities Manager, and Verbal cues Education comprehension: verbalized understanding, returned demonstration, verbal cues required, and needs further education  HOME EXERCISE PROGRAM: Access Code: [email or web address]  URL: [email or web address] Date: 11/25/2023 Prepared by: Damien Caulk  Exercises - Standing March with Counter Support  - 1 x daily - 5 x weekly - 1-2 sets - 10 reps - Heel Raises with Counter Support  - 1 x daily - 5 x weekly - 1-2 sets - 10 reps -  Standing Hip Abduction with Counter Support  - 1 x daily - 5 x weekly - 1-2 sets - 10 reps - Backward Walking with Counter Support  - 1 x daily - 5 x weekly - 3 sets - 10 reps - Walking with Eyes Closed and Counter Support  - 1 x daily - 5 x weekly - 3 sets - 10 reps - Tandem Walking with Counter Support  - 1 x daily - 5 x weekly - 3 sets - 10 reps - Walking with Head Rotation  - 1 x daily - 5 x weekly - 3 sets - 10 reps  You Can Walk For A Certain Length Of Time Each Day                          Walk 5 minutes 1-2 times per day.             Increase 1-2  minutes every week             Work up to 20 minutes (1x per day).  GOALS: Goals reviewed with patient? Yes  SHORT TERM GOALS: Target date: 12/23/2023  Pt will be independent with initial HEP in order to improve strength and balance in order to decrease fall risk and improve function at home for ADL's. Baseline: Issued initially 11/25/23 Goal status: INITIAL  2.  Assess FGA and update LTG as needed. Baseline: 15/30 (11/4) Goal status: MET  3.  Patient will tolerate 5 seconds of single leg stance without loss of balance B LE's to improve ability to get in and out of shower safely. Baseline: 2.78 seconds R LE and 3.43 seconds L LE on Eval Goal status: INITIAL  4. Patient will be compliant to formal walking program >/= 3 days per week to improve aerobic tolerance and ambulatory mechanics.   LONG TERM GOALS: Target date: 01/06/2024  Pt will be independent with final HEP in order to improve strength and balance in order to decrease fall risk and improve function at home for ADL's. Baseline: Issued initially 11/25/23 Goal status: INITIAL  2.  Pt will decrease 5 Time Sit to Stand by at least 3 seconds or more in order to demonstrate clinically significant improvement in LE strength. Baseline: 17.28 seconds on Eval Goal status: INITIAL  3.  Pt will increase by at least 0.13 m/s in order to demonstrate clinically significant  improvement in community ambulation.  Baseline: 0.877 m/sec on Eval Goal status: INITIAL  4.  Patient will increase Functional Gait Assessment score to >22/30 as to reduce fall risk and improve dynamic gait safety with community ambulation. Baseline: 15/30 (11/4) Goal status: INITIAL  5.  Pt will demonstrate improved B LE foot clearance for safety with gait. Baseline: decreased B LE step length/foot clearance on Eval Goal status: INITIAL  ASSESSMENT:  CLINICAL IMPRESSION: Patient was seen today for physical therapy treatment to address strength and balance.  Focused session on aerobic endurance, LE strengthening,  increasing B LE foot clearance for ambulation (use of ankle weights for gait; toe taps to 6 and 12 inch step), and single leg dynamic balance/stability.  Patient continues to be  limited by aerobic endurance, strength, and balance.  They demonstrate improvement in balance response with repetition of activities.  They would continue to benefit from skilled PT to address impairments as noted and progress towards long term goals.  OBJECTIVE IMPAIRMENTS: Abnormal gait, cardiopulmonary status limiting activity, decreased activity tolerance, decreased balance, decreased coordination, decreased endurance, decreased knowledge of condition, decreased knowledge of use of DME, decreased mobility, difficulty walking, decreased strength, and postural dysfunction.   ACTIVITY LIMITATIONS: carrying, lifting, standing, squatting, stairs, transfers, toileting, dressing, and locomotion level  PARTICIPATION LIMITATIONS: cleaning, laundry, driving, shopping, and community activity  PERSONAL FACTORS: Age, Fitness, Past/current experiences, and 1-2 comorbidities: CAD s/p CABG, htn, PSVT are also affecting patient's functional outcome.   REHAB POTENTIAL: Good  CLINICAL DECISION MAKING: Evolving/moderate complexity  EVALUATION COMPLEXITY: Moderate  PLAN:  PT FREQUENCY: 2x/week  PT DURATION: 6  weeks  PLANNED INTERVENTIONS: 97164- PT Re-evaluation, 97750- Physical Performance Testing, 97110-Therapeutic exercises, 97530- Therapeutic activity, [id number]- Neuromuscular re-education, 97535- Self Care, [id number]- Manual therapy, [id number]- Gait training, [phone]- Orthotic Initial, [phone]- Orthotic/Prosthetic subsequent, [phone]- Aquatic Therapy, [phone]- Electrical stimulation (manual), [phone]- Ultrasound, Patient/Family education, Balance training, Stair training, Taping, Joint mobilization, DME instructions, Cryotherapy, and Moist heat  PLAN FOR NEXT SESSION: check on HEP/walking program (and progress?); balance; increase B LE foot clearance; obstacle clearance, backwards gait, visually limited conditions, compliant surfaces, blaze pods, terminal hip flexor engagement   Damien Caulk, PT 12/08/2023, 8:38 PM

## 2023-12-15 ENCOUNTER — Ambulatory Visit: Admitting: Physical Therapy

## 2023-12-15 VITALS — BP 143/74 | HR 66

## 2023-12-15 DIAGNOSIS — Z9181 History of falling: Secondary | ICD-10-CM

## 2023-12-15 DIAGNOSIS — R2689 Other abnormalities of gait and mobility: Secondary | ICD-10-CM

## 2023-12-15 DIAGNOSIS — I639 Cerebral infarction, unspecified: Secondary | ICD-10-CM

## 2023-12-15 NOTE — Therapy (Signed)
 OUTPATIENT PHYSICAL THERAPY NEURO TREATMENT   Patient Name: Robert Pacheco MRN: [id number] DOB:06/10/1933, 89 y.o., male Today's Date: 12/15/2023   PCP: [name], DO REFERRING PROVIDER: Raenelle Donalda HERO, MD  END OF SESSION:  PT End of Session - 12/15/23 1448     Visit Number 5    Number of Visits 13   12 plus Eval   Date for Recertification  01/22/24   for scheduling delays   Authorization Type [facility] Medicare    Authorization Time Period 11/25/23 - 01/20/24    Authorization - Number of Visits 6    Progress Note Due on Visit 10    PT Start Time 1445    PT Stop Time 1528    PT Time Calculation (min) 43 min    Equipment Utilized During Treatment Gait belt    Activity Tolerance Patient tolerated treatment well    Behavior During Therapy [facility] for tasks assessed/performed           Past Medical History:  Diagnosis Date   Allergy    Coronary artery disease    Hyperlipidemia    Hypertension    Personal history of colonic adenomas 05/10/2012   Skin cancer, basal cell    Past Surgical History:  Procedure Laterality Date   CATARACT EXTRACTION     COLONOSCOPY  multiple   CORONARY ARTERY BYPASS GRAFT  2000   EYE SURGERY     HERNIA REPAIR     MOHS SURGERY     Patient Active Problem List   Diagnosis Date Noted   Transaminitis 11/06/2023   History of TIA (transient ischemic attack) 11/05/2023   Right sided weakness 11/05/2023   Facial droop 11/05/2023   History of atrial tachycardia 11/05/2023   History of CAD (coronary artery disease) 11/05/2023   Acute CVA (cerebrovascular accident) (HCC) 11/05/2023   Elevated liver enzymes 11/05/2023   PSVT (paroxysmal supraventricular tachycardia) 03/27/2021   Senile purpura 09/26/2020   Bilateral leg edema 07/11/2020   Dizziness 07/11/2020   Coronary artery disease involving native coronary artery of native heart without angina pectoris 07/11/2020   Mixed hyperlipidemia 07/11/2020   Allergy 07/10/2020    Lightheadedness 04/23/2020   Lumbar radiculopathy 08/08/2019   Greater trochanteric pain syndrome of right lower extremity 04/18/2019   Constipation 04/18/2019   Bilateral lower extremity edema 10/15/2018   Displacement of intraocular lens 06/16/2017   Combined forms of age-related cataract of left eye 03/09/2014   Regular astigmatism 03/09/2014   Family history of colon cancer 10/01/2012   History of colonic polyps 05/10/2012   Personal history of colonic adenomas 05/10/2012   BPH (benign prostatic hyperplasia) 03/27/2009   DYSPLASTIC NEVUS, FACE 10/05/2008   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 He continues to work out on the treadmill at home and monitor his vitals.  Pt accompanied by: self and family member (daughter stayed in waiting room; son in law stayed in car)  PERTINENT HISTORY: Hospitalization 11/04/23 to 11/07/23; pt with R facial droop, dysphagia, and R sided weakness; found to have acute L basal ganglia CVA.  ED visit 11/19/24 d/t sudden onset numbness B UE's/LE's (sx's then resolved; no motor symptoms); imaging showing significant DDD with spinal cord mass effect--recommended f/u OP with Neurosurgery; no collar necessary per MD [name].  Per follow-up Neurology note from DO [facility]  11/24/23 pt continues to have spells of numbness involving the arms and leg which occurs a few times per week. Symptoms are not constant. He denies arm/leg weakness or imbalance. He has some neck discomfort, which is worse at night time. He also mentions mild numbness around the lips on both side of the mouth. No problems with speech or swallow.   PMH includes CAD s/p CABG, htn, PSVT, HLD.  PAIN:  Are you having pain? No  PRECAUTIONS: Fall; C-spine imaging showing significant DDD with spinal cord mass effect  RED FLAGS: Cervical red flags: Dysphagia No, Dysmetria No, Diplopia No, Nystagmus No, and Nausea No   WEIGHT BEARING RESTRICTIONS: No  FALLS: Has patient fallen in last 6 months? Yes. Number of falls 1 (fell between bed and end table; skin tear to L UE)  LIVING ENVIRONMENT: Lives with: lives alone Lives in: House/apartment Stairs: Yes: External: 2 steps; none Has following equipment at home: Single point cane, Walker - 2 wheeled, shower chair, and Grab bars.  PLOF: Independent.  Hasn't drove lately (waiting until has more energy back).  Mostly uses walker in bedroom.  PATIENT GOALS: Be able to move a little better.  Improve leg strength.  OBJECTIVE:  Note: Objective measures were completed at Evaluation unless otherwise noted.  DIAGNOSTIC FINDINGS:  MRI Brain 11/05/23: Small acute perforator infarct in the left basal ganglia.   MRI c-spine 11/20/23: 1. Multifactorial degenerative cervical spinal stenosis WITH spinal cord mass effect from C2-C3 through C6-C7. No spinal cord edema or myelomalacia identified. 2. 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 3. Degenerative severe right C4 and C7 nerve level foraminal stenosis, moderate at the left C5, right C6, left C7 levels.  COGNITION: Overall cognitive status: Within functional limits for tasks assessed   SENSATION: Intact B LE light touch (pt with self  reported decreased sensation below knees to feet B)  COORDINATION: Mild decreased B rapid alternating toe tapping Edema: L LE more edematous than R LE   MUSCLE TONE: Intact B LE's  Edema: 28.8 cm L LE malleolar line    30 cm R LE malleolar line    Chronic LE edema per pt report  POSTURE: rounded shoulders, forward head, and posterior pelvic tilt  LOWER EXTREMITY ROM:     Active  Right Eval Left Eval  Hip flexion WNL WNL  Hip extension    Hip abduction    Hip adduction    Hip internal rotation    Hip external rotation    Knee flexion WNL WNL  Knee extension WNL WNL  Ankle dorsiflexion WNL WNL  Ankle plantarflexion WNL WNL  Ankle inversion    Ankle eversion     (Blank rows = not tested)  LOWER EXTREMITY MMT:    MMT Right Eval Left Eval  Hip flexion 5/5  4+/5  Hip extension    Hip abduction    Hip adduction    Hip internal rotation    Hip external rotation    Knee flexion 5/5 4+/5  Knee extension 5/5 4+/5  Ankle dorsiflexion At least 3/5 AROM At least 3/5 AROM  Ankle plantarflexion    Ankle inversion    Ankle eversion    (Blank rows = not tested)  TRANSFERS: Use of B UE's Sit to stand: Modified independence  Assistive device utilized: None     Stand to sit: Modified independence  Assistive device utilized: None      GAIT: Findings: Mild flexed posture; decreased B LE step length/foot clearance;  decreased B LE heel-strike (more foot flat); decreased R UE (compared to L UE) arm swing; clinic distances; SBA  FUNCTIONAL TESTS:  5 times sit to stand: 17.28 seconds; B UE support 10 meter walk test: 0.877 m/sec (11.40 seconds; no AD use) SLS: 2.78 seconds R LE; 3.43 seconds L LE  PATIENT SURVEYS:  ABC Scale: TBA                                                                                                                             TREATMENT DATE: 12/15/23  Self Care BP and HR taken in sitting at rest beginning of session (see below for  details). Vitals:   12/15/23 1450  BP: (!) 143/74  Pulse: 66   Updated patient on approved # of visits per insurance, plan to assess outcome measures next visit and re-submit to cover remaining visits.  Therapeutic Exercise SciFit level 3 for 8 minutes using BUE/BLEs for endurance challenge and large amplitude reciprocal mobility. RPE of 6-7/10 following activity. HR at 4 min: 100 bpm HR at 8 min: 88 bpm Educated him on max HR range (130s) as well as goal of RPE 6-7/10   Therapeutic Activity To work on dynamic balance, SLS, and visual scanning: Alt L/R LE Blaze pod taps in semi-cirlce, medium difficulty, gets dizzy, 27 hits Added in black resistance band around hips, gets dizzy, 18 hits Deferred further LE tapping of Blaze pods due to onset of dizziness Alt L/R UE Blaze pod taps at ballet bar on mirror, easy, no dizziness, 28 hits Added in blue mat under LE, x 28 hits, slightly more challenging Added in airex under LE, x 29 hits, little harder Added in black resistance band around hips on solid ground, more challening x 23 hits, x 26 hits  PATIENT EDUCATION: Education details: Continue HEP and working towards walking program, see above regarding HR max and RPE goal as well as plan to reassess outcome measures next visit for insurance auth Person educated: Patient Education method: Programmer, Multimedia, Facilities Manager, and Verbal cues Education comprehension: verbalized understanding, returned demonstration, verbal cues required, and needs further education  HOME EXERCISE PROGRAM: Access Code: [email or web address] URL: [email or web address] Date: 11/25/2023 Prepared by: Damien Caulk  Exercises - Standing March with Counter Support  - 1 x daily -  5 x weekly - 1-2 sets - 10 reps - Heel Raises with Counter Support  - 1 x daily - 5 x weekly - 1-2 sets - 10 reps - Standing Hip Abduction with Counter Support  - 1 x daily - 5 x weekly - 1-2 sets - 10 reps - Backward Walking with  Counter Support  - 1 x daily - 5 x weekly - 3 sets - 10 reps - Walking with Eyes Closed and Counter Support  - 1 x daily - 5 x weekly - 3 sets - 10 reps - Tandem Walking with Counter Support  - 1 x daily - 5 x weekly - 3 sets - 10 reps - Walking with Head Rotation  - 1 x daily - 5 x weekly - 3 sets - 10 reps  You Can Walk For A Certain Length Of Time Each Day                          Walk 5 minutes 1-2 times per day.             Increase 1-2  minutes every week             Work up to 20 minutes (1x per day).  GOALS: Goals reviewed with patient? Yes  SHORT TERM GOALS: Target date: 12/23/2023  Pt will be independent with initial HEP in order to improve strength and balance in order to decrease fall risk and improve function at home for ADL's. Baseline: Issued initially 11/25/23 Goal status: INITIAL  2.  Assess FGA and update LTG as needed. Baseline: 15/30 (11/4) Goal status: MET  3.  Patient will tolerate 5 seconds of single leg stance without loss of balance B LE's to improve ability to get in and out of shower safely. Baseline: 2.78 seconds R LE and 3.43 seconds L LE on Eval Goal status: INITIAL  4. Patient will be compliant to formal walking program >/= 3 days per week to improve aerobic tolerance and ambulatory mechanics.   LONG TERM GOALS: Target date: 01/06/2024  Pt will be independent with final HEP in order to improve strength and balance in order to decrease fall risk and improve function at home for ADL's. Baseline: Issued initially 11/25/23 Goal status: INITIAL  2.  Pt will decrease 5 Time Sit to Stand by at least 3 seconds or more in order to demonstrate clinically significant improvement in LE strength. Baseline: 17.28 seconds on Eval Goal status: INITIAL  3.  Pt will increase by at least 0.13 m/s in order to demonstrate clinically significant improvement in community ambulation.  Baseline: 0.877 m/sec on Eval Goal status: INITIAL  4.  Patient will  increase Functional Gait Assessment score to >22/30 as to reduce fall risk and improve dynamic gait safety with community ambulation. Baseline: 15/30 (11/4) Goal status: INITIAL  5.  Pt will demonstrate improved B LE foot clearance for safety with gait. Baseline: decreased B LE step length/foot clearance on Eval Goal status: INITIAL  ASSESSMENT:  CLINICAL IMPRESSION: Emphasis of skilled PT session on continuing to assess vitals, working on global endurance training, and working on editor, commissioning. Educated patient on HR goal as well as RPE goal during exercise. He does have onset of dizziness with standing LE Blaze pod task but has no dizziness with UE Blaze pod task, may have been brought on by cervical flexion so deferred further repetitions exercise placing his head in this position. He continues to  benefit from skilled PT services to work on improving his endurance, his functional strength, and his balance. Plan to reassess outcome measures next visit for insurance authorization. Continue POC.   OBJECTIVE IMPAIRMENTS: Abnormal gait, cardiopulmonary status limiting activity, decreased activity tolerance, decreased balance, decreased coordination, decreased endurance, decreased knowledge of condition, decreased knowledge of use of DME, decreased mobility, difficulty walking, decreased strength, and postural dysfunction.   ACTIVITY LIMITATIONS: carrying, lifting, standing, squatting, stairs, transfers, toileting, dressing, and locomotion level  PARTICIPATION LIMITATIONS: cleaning, laundry, driving, shopping, and community activity  PERSONAL FACTORS: Age, Fitness, Past/current experiences, and 1-2 comorbidities: CAD s/p CABG, htn, PSVT are also affecting patient's functional outcome.   REHAB POTENTIAL: Good  CLINICAL DECISION MAKING: Evolving/moderate complexity  EVALUATION COMPLEXITY: Moderate  PLAN:  PT FREQUENCY: 2x/week  PT DURATION: 6 weeks  PLANNED INTERVENTIONS: [id number]- PT  Re-evaluation, 97750- Physical Performance Testing, 97110-Therapeutic exercises, 97530- Therapeutic activity, [id number]- Neuromuscular re-education, 97535- Self Care, [id number]- Manual therapy, [id number]- Gait training, [phone]- Orthotic Initial, [phone]- Orthotic/Prosthetic subsequent, [phone]- Aquatic Therapy, [phone]- Electrical stimulation (manual), [phone]- Ultrasound, Patient/Family education, Balance training, Stair training, Taping, Joint mobilization, DME instructions, Cryotherapy, and Moist heat  PLAN FOR NEXT SESSION: check on HEP/walking program (and progress?); balance; increase B LE foot clearance; obstacle clearance, backwards gait, visually limited conditions, compliant surfaces, blaze pods, terminal hip flexor engagement, assess STG/outcome measures and resubmit to insurance to cover remaining visits   Waddell Southgate, PT Waddell Southgate, PT, DPT, CSRS  12/15/2023, 3:29 PM

## 2023-12-17 ENCOUNTER — Encounter: Payer: Self-pay | Admitting: Physical Therapy

## 2023-12-17 ENCOUNTER — Ambulatory Visit: Admitting: Physical Therapy

## 2023-12-17 VITALS — BP 135/69 | HR 67

## 2023-12-17 DIAGNOSIS — Z9181 History of falling: Secondary | ICD-10-CM

## 2023-12-17 DIAGNOSIS — I639 Cerebral infarction, unspecified: Secondary | ICD-10-CM

## 2023-12-17 DIAGNOSIS — R2689 Other abnormalities of gait and mobility: Secondary | ICD-10-CM

## 2023-12-17 NOTE — Therapy (Signed)
 OUTPATIENT PHYSICAL THERAPY NEURO TREATMENT   Patient Name: Robert Pacheco MRN: [id number] DOB:[birth year], 89 y.o., male Today's Date: 12/17/2023   PCP: Robert [name], DO REFERRING PROVIDER: Raenelle Donalda HERO, MD  END OF SESSION:  PT End of Session - 12/17/23 1536     Visit Number 6    Number of Visits 13   12 plus Eval   Date for Recertification  01/22/24   for scheduling delays   Authorization Type [facility] Medicare    Authorization Time Period 11/25/23 - 01/20/24    Authorization - Visit Number 6    Authorization - Number of Visits 6    Progress Note Due on Visit 10    PT Start Time 1535    PT Stop Time 1624    PT Time Calculation (min) 49 min    Equipment Utilized During Treatment Gait belt    Activity Tolerance Patient tolerated treatment well    Behavior During Therapy [facility] P[facility]co for tasks assessed/performed           Past Medical History:  Diagnosis Date   Allergy    Coronary artery disease    Hyperlipidemia    Hypertension    Personal history of colonic adenomas 05/10/2012   Skin cancer, basal cell    Past Surgical History:  Procedure Laterality Date   CATARACT EXTRACTION     COLONOSCOPY  multiple   CORONARY ARTERY BYPASS GRAFT  2000   EYE SURGERY     HERNIA REPAIR     MOHS SURGERY     Patient Active Problem List   Diagnosis Date Noted   Transaminitis 11/06/2023   History of TIA (transient ischemic attack) 11/05/2023   Right sided weakness 11/05/2023   Facial droop 11/05/2023   History of atrial tachycardia 11/05/2023   History of CAD (coronary artery disease) 11/05/2023   Acute CVA (cerebrovascular accident) (HCC) 11/05/2023   Elevated liver enzymes 11/05/2023   PSVT (paroxysmal supraventricular tachycardia) 03/27/2021   Senile purpura 09/26/2020   Bilateral leg edema 07/11/2020   Dizziness 07/11/2020   Coronary artery disease involving native coronary artery of native heart without angina pectoris 07/11/2020   Mixed hyperlipidemia 07/11/2020    Allergy 07/10/2020   Lightheadedness 04/23/2020   Lumbar radiculopathy 08/08/2019   Greater trochanteric pain syndrome of right lower extremity 04/18/2019   Constipation 04/18/2019   Bilateral lower extremity edema 10/15/2018   Displacement of intraocular lens 06/16/2017   Combined forms of age-related cataract of left eye 03/09/2014   Regular astigmatism 03/09/2014   Family history of colon cancer 10/01/2012   History of colonic polyps 05/10/2012   Personal history of colonic adenomas 05/10/2012   BPH (benign prostatic hyperplasia) 03/27/2009   DYSPLASTIC NEVUS, FACE 10/05/2008   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  Has lightheadedness in mornings (going back to see PCP next week and will discuss with PCP then); no current symptoms.  Forgot to put in hearing aide today.  Pt accompanied by: self and family member (daughter stayed in waiting room)  PERTINENT HISTORY: Hospitalization 11/04/23 to 11/07/23; pt with R facial droop, dysphagia, and R sided weakness; found to have acute L basal ganglia CVA.  ED visit 11/19/24 d/t sudden onset numbness B UE's/LE's (sx's then resolved; no motor symptoms); imaging showing significant DDD with spinal cord mass effect--recommended f/u OP with Neurosurgery; no collar necessary  per MD Robert Pacheco.  Per follow-up Neurology note from DO Robert Pacheco 11/24/23 pt continues to have spells of numbness involving the arms and leg which occurs a few times per week. Symptoms are not constant. He denies arm/leg weakness or imbalance. He has some neck discomfort, which is worse at night time. He also mentions mild numbness around the lips on both side of the mouth. No problems with speech or swallow.   PMH includes CAD s/p CABG, htn, PSVT, HLD.  PAIN:  Are you having pain? No  PRECAUTIONS: Fall; C-spine imaging showing significant DDD with spinal cord mass effect  RED FLAGS: Cervical red flags: Dysphagia No, Dysmetria No, Diplopia No, Nystagmus No, and Nausea No   WEIGHT BEARING RESTRICTIONS: No  FALLS: Has patient fallen in last 6 months? Yes. Number of falls 1 (fell between bed and end table; skin tear to L UE)  LIVING ENVIRONMENT: Lives with: lives alone Lives in: House/apartment Stairs: Yes: External: 2 steps; none Has following equipment at home: Single point cane, Walker - 2 wheeled, shower chair, and Grab bars.  PLOF: Independent.  Hasn't drove lately (waiting until has more energy back).  Mostly uses walker in bedroom.  PATIENT GOALS: Be able to move a little better.  Improve leg strength.  OBJECTIVE:  Note: Objective measures were completed at Evaluation unless otherwise noted.  DIAGNOSTIC FINDINGS:  MRI Brain 11/05/23: Small acute perforator infarct in the left basal ganglia.   MRI c-spine 11/20/23: 1. Multifactorial degenerative cervical spinal stenosis WITH spinal cord mass effect from C2-C3 through C6-C7. No spinal cord edema or myelomalacia identified. 2. 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 3. Degenerative severe right C4 and C7 nerve level foraminal stenosis, moderate at the left C5, right C6, left C7 levels.  COGNITION: Overall cognitive status: Within functional limits for tasks  assessed   SENSATION: Intact B LE light touch (pt with self reported decreased sensation below knees to feet B)  COORDINATION: Mild decreased B rapid alternating toe tapping Edema: L LE more edematous than R LE   MUSCLE TONE: Intact B LE's  Edema: 28.8 cm L LE malleolar line    30 cm R LE malleolar line    Chronic LE edema per pt report  POSTURE: rounded shoulders, forward head, and posterior pelvic tilt  LOWER EXTREMITY ROM:     Active  Right Eval Left Eval  Hip flexion WNL WNL  Hip extension    Hip abduction    Hip adduction    Hip internal rotation    Hip external rotation    Knee flexion WNL WNL  Knee extension WNL WNL  Ankle dorsiflexion WNL WNL  Ankle plantarflexion WNL WNL  Ankle inversion    Ankle eversion     (Blank rows = not tested)  LOWER EXTREMITY MMT:    MMT Right Eval Left  Eval  Hip flexion 5/5 4+/5  Hip extension    Hip abduction    Hip adduction    Hip internal rotation    Hip external rotation    Knee flexion 5/5 4+/5  Knee extension 5/5 4+/5  Ankle dorsiflexion At least 3/5 AROM At least 3/5 AROM  Ankle plantarflexion    Ankle inversion    Ankle eversion    (Blank rows = not tested)  TRANSFERS: Use of B UE's Sit to stand: Modified independence  Assistive device utilized: None     Stand to sit: Modified independence  Assistive device utilized: None      GAIT: Findings: Mild flexed posture; decreased B LE step length/foot clearance;  decreased B LE heel-strike (more foot flat); decreased R UE (compared to L UE) arm swing; clinic distances; SBA  FUNCTIONAL TESTS:  5 times sit to stand: 17.28 seconds; B UE support on Eval.  17.94 seconds (pt intermittently using UE's and no UE's) on 12/17/23 10 meter walk test: 0.877 m/sec (11.40 seconds; no AD use) on Eval.. 0.87 m/sec (11.41 seconds) 12/17/23 SLS: 2.78 seconds R LE and 3.43 seconds L LE on Eval.  L LE 6.37 seconds and R LE 13.0 seconds on 12/17/23 FGA: 15/30 (12/01/23); 19/30 on  12/17/23  FUNCTIONAL GAIT ASSESSMENT  Date: 12/17/23 Score  GAIT LEVEL SURFACE Instructions: Walk at your normal speed from here to the next mark (6 m) [20 ft]. (1) Moderate impairment-Walks 6 m (20 ft), slow speed, abnormal gait pattern, evidence for imbalance, or deviates 25.4 - 38.1 cm (10 -15 in) outside of the 30.48-cm (12-in) walkway width. Requires more than 7 seconds to ambulate 6 m (20 ft). (7.88 seconds)  2.   CHANGE IN GAIT SPEED Instructions: Begin walking at your normal pace (for 1.5 m [5 ft]). When I tell you "go," walk as fast as you can (for 1.5 m [5 ft]). When I tell you "slow," walk as slowly as you can (for 1.5 m [5 ft]. (2) Mild impairment - Is able to change speed but demonstrates mild gait deviations, deviates 15.24 -25.4 cm (6 -10 in) outside of the 30.48-cm (12-in) walkway width, or no gait deviations but unable to achieve a significant change in velocity, or uses an assistive device  3.    GAIT WITH HORIZONTAL HEAD TURNS Instructions: Walk from here to the next mark 6 m (20 ft) away. Begin walking at your normal pace. Keep walking straight; after 3 steps, turn your head to the right and keep walking straight while looking to the right. After 3 more steps, turn your head to the left and keep walking straight while looking left. Continue alternating looking right and left. (2) Mild impairment - Performs head turns smoothly with slight change in gait velocity (eg, minor disruption to smooth gait path), deviates 15.24 -25.4 cm (6 -10 in) outside 30.48-cm (12-in) walkway width, or uses an assistive device.  4.   GAIT WITH VERTICAL HEAD TURNS Instructions: Walk from here to the next mark (6 m [20 ft]). Begin walking at your normal pace. Keep walking straight; after 3 steps, tip your head up and keep walking straight while looking up. After 3 more steps, tip your head down, keep walking straight while looking down. Continue  alternating looking up and down every 3 steps until you have  completed 2 repetitions in each direction. (2) Mild impairment - Performs task with slight change in gait velocity (eg, minor disruption to smooth gait path), deviates 15.24 -25.4 cm (  6 -10 in) outside 30.48-cm (12-in) walkway width or uses assistive device.  5.  GAIT AND PIVOT TURN Instructions: Begin with walking at your normal pace. When I tell you, "turn and stop," turn as quickly as you can to face the opposite direction and stop. (3) Normal - Pivot turns safely within 3 seconds and stops quickly with no loss of balance  6.   STEP OVER OBSTACLE Instructions: Begin walking at your normal speed. When you come to the shoe box, step over it, not around it, and keep walking. (2) Mild impairment - Is able to step over one shoe box (11.43 cm [4.5 in] total height) without changing gait speed; no evidence of imbalance.  7.   GAIT WITH NARROW BASE OF SUPPORT Instructions: Walk on the floor with arms folded across the chest, feet aligned heel to toe in tandem for a distance of 3.6 m [12 ft]. The number of steps taken in a straight line are counted for a maximum of 10 steps. (2) Mild impairment - Ambulates 7-9 steps  8.   GAIT WITH EYES CLOSED Instructions: Walk at your normal speed from here to the next mark (6 m [20 ft]) with your eyes closed. (1) Moderate impairment - Walks 6 m (20 ft), slow speed, abnormal gait pattern, evidence for imbalance, deviates 25.4 -38.1 cm (10 -15 in) outside 30.48-cm (12-in) walkway width. Requires more than 9 seconds to ambulate 6 m (20 ft). (10.19 seconds)  9.   AMBULATING BACKWARDS Instructions: Walk backwards until I tell you to stop (2) Mild impairment - Walks 6 m (20 ft), uses assistive device, slower speed, mild gait deviations, deviates 15.24 -25.4 cm (6 -10 in) outside 30.48-cm (12-in) walkway width (16.72 seconds)  10. STEPS Instructions: Walk up these stairs as you would at home (ie, using the rail if necessary). At the top turn around and walk down. (2) Mild  impairment-Alternating feet, must use rail.  Total 19/30   Interpretation of scores: Non-Specific Older Adults Cutoff Score: <=22/30 = risk of falls Parkinson's Disease Cutoff score <15/30= fall risk (Hoehn & Yahr 1-4)  Minimally Clinically Important Difference (MCID)  Stroke (acute, subacute, and chronic) = MDC: 4.2 points Vestibular (acute) = MDC: 6 points Community Dwelling Older Adults =  MCID: 4 points Parkinson's Disease  =  MDC: 4.3 points  (Academy of Neu[employer]ic Physical Therapy (nd). Functional Gait Assessment. Retrieved from https://www.neuropt.org/docs/default-source/cpgs/core-outcome-measures/function-gait-assessment-pocket-guide-proof9-(2).pdf?sfvrsn=b77f35043_0.)                                                                                                                       TREATMENT DATE: 12/17/23  Self Care: BP and HR taken in sitting at rest beginning of session (see below for details). Vitals:   12/17/23 1540  BP: 135/69  Pulse: 67   Therapeutic Activity: Assessed STG's and LTG's (see below and STG's/LTG's for additional details) SLS: L LE 6.37 seconds and R LE 13.0 seconds FGA: 19/30 (see above for additional details) 5 time sit to stand: 17.94  seconds (pt intermittently using UE's and no UE's) : 0.87 m/sec  Therapeutic Exercise: SciFit level 3 for 8 minutes using BUE/BLEs for endurance challenge and large amplitude reciprocal mobility. RPE of 5/10 following activity. HR at 4 min:  76 bpm HR at 8 min:  80 bpm Reviewed HEP and walking program  PATIENT EDUCATION: Education details: STG's and outcome measure results.  Pt aware he met 6/6 authorized insurance visits today (notified last session regarding this as well).  Plan to submit for insurance auth for additional therapy visits.  Continue HEP and working towards walking program. Person educated: Patient Education method: Programmer, Multimedia, Facilities Manager, and Verbal cues Education comprehension:  verbalized understanding, returned demonstration, verbal cues required, and needs further education  HOME EXERCISE PROGRAM: Access Code: [email or web address] URL: [email or web address] Date: 11/25/2023 Prepared by: Damien Caulk  Exercises - Standing March with Counter Support  - 1 x daily - 5 x weekly - 1-2 sets - 10 reps - Heel Raises with Counter Support  - 1 x daily - 5 x weekly - 1-2 sets - 10 reps - Standing Hip Abduction with Counter Support  - 1 x daily - 5 x weekly - 1-2 sets - 10 reps - Backward Walking with Counter Support  - 1 x daily - 5 x weekly - 3 sets - 10 reps - Walking with Eyes Closed and Counter Support  - 1 x daily - 5 x weekly - 3 sets - 10 reps - Tandem Walking with Counter Support  - 1 x daily - 5 x weekly - 3 sets - 10 reps - Walking with Head Rotation  - 1 x daily - 5 x weekly - 3 sets - 10 reps  You Can Walk For A Certain Length Of Time Each Day                          Walk 5 minutes 1-2 times per day.             Increase 1-2  minutes every week             Work up to 20 minutes (1x per day).  GOALS: Goals reviewed with patient? Yes  SHORT TERM GOALS: Target date: 12/23/2023  Pt will be independent with initial HEP in order to improve strength and balance in order to decrease fall risk and improve function at home for ADL's. Baseline: Issued initially 11/25/23 Goal status: MET  2.  Assess FGA and update LTG as needed. Baseline: 15/30 (12/01/23) Goal status: MET  3.  Patient will tolerate 5 seconds of single leg stance without loss of balance B LE's to improve ability to get in and out of shower safely. Baseline: 2.78 seconds R LE and 3.43 seconds L LE on Eval; L LE 6.37 seconds and R LE 13.0 seconds 12/17/23 Goal status: MET  4. Patient will be compliant to formal walking program >/= 3 days per week to improve aerobic tolerance and ambulatory mechanics.  Baseline:  Goal status: PARTIALLY MET (pt using treadmill 3x/week to walk but not doing  overground walking program)   LONG TERM GOALS: Target date: 01/06/2024  Pt will be independent with final HEP in order to improve strength and balance in order to decrease fall risk and improve function at home for ADL's. Baseline: Issued initially 11/25/23 Goal status: INITIAL  2.  Pt will decrease 5 Time Sit to Stand by at least 3 seconds or more in order to demonstrate clinically significant  improvement in LE strength. Baseline: 17.28 seconds on Eval with B UE support; 17.94 seconds (pt intermittently using UE's and no UE's) on 12/17/23 Goal status: INITIAL  3.  Pt will increase by at least 0.13 m/s in order to demonstrate clinically significant improvement in community ambulation.  Baseline: 0.877 m/sec on Eval; 0.87 m/sec on 12/17/23 Goal status: INITIAL  4.  Patient will increase Functional Gait Assessment score to >22/30 as to reduce fall risk and improve dynamic gait safety with community ambulation. Baseline: 15/30 (12/01/23); 19/30 on 12/17/23 Goal status: INITIAL  5.  Pt will demonstrate improved B LE foot clearance for safety with gait. Baseline: decreased B LE step length/foot clearance on Eval; pt reporting able to pick them up a little better on 12/17/23 Goal status: INITIAL  ASSESSMENT:  CLINICAL IMPRESSION: Patient was seen today for physical therapy treatment to address STG's, outcome measures, and strengthening/endurance.  Pt has met visit limit today (pt aware and was notified last session as well).  Focused session on assessing STG's/outcome measures and also overall strengthening/endurance.  Pt has met 3/4 STG's and partially met 1/4 STG's (pt walking on treadmill instead of overground for walking program).  Outcome measures assessed during today's session included 5 time sit to stand, , SLS, and FGA.  Pt scored 19/30 on FGA indicating pt is at increased risk of falls (</= 22/30 = risk of falls in elderly); improved from 15/30 on 12/01/23.  Pt improve SLS on  both LE's (2.78 seconds R LE on eval to 13.0 seconds today; 3.43 seconds L LE on Eval to 6.37 seconds today); pt reports noticing this improvement when dressing.  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  Pt scored 17.94 seconds on the 5 time sit to stand test (pt intermittently using UE's and no UE's) indicating pt is at increased risk of falls (>15 seconds = increased risk of falls); pt scored 17.28 seconds on Eval (use of B UE's consistently); anticipate pt did not improve on sit to stand test d/t pt not consistently using UE's like pt had done on eval.  Patient continues to be limited by strength, endurance, balance, and foot clearance.  They do demonstrate improvement in global endurance compared to previous sessions.  Pt reports noticing improvements in strength and balance with daily activities and would like to continue with therapy to continue making improvements.  They would continue to benefit from skilled PT to address impairments as noted and progress towards long term goals.  Plan to submit for insurance auth for 7 more visits to continue to work towards LTG's.   OBJECTIVE IMPAIRMENTS: Abnormal gait, cardiopulmonary status limiting activity, decreased activity tolerance, decreased balance, decreased coordination, decreased endurance, decreased knowledge of condition, decreased knowledge of use of DME, decreased mobility, difficulty walking, decreased strength, and postural dysfunction.   ACTIVITY LIMITATIONS: carrying, lifting, standing, squatting, stairs, transfers, toileting, dressing, and locomotion level  PARTICIPATION LIMITATIONS: cleaning, laundry, driving, shopping, and community activity  PERSONAL FACTORS: Age, Fitness, Past/current experiences, and 1-2 comorbidities: CAD  s/p CABG, htn, PSVT are also affecting patient's functional outcome.   REHAB POTENTIAL: Good  CLINICAL DECISION MAKING: Evolving/moderate complexity  EVALUATION COMPLEXITY: Moderate  PLAN:  PT FREQUENCY: 2x/week  PT DURATION: 6 weeks  PLANNED INTERVENTIONS: [id number]- PT Re-evaluation, 97750- Physical Performance  Testing, 97110-Therapeutic exercises, 97530- Therapeutic activity, [id number]- Neuromuscular re-education, [phone]- Self Care, [id number]- Manual therapy, [phone]- Gait training, [phone]- Orthotic Initial, [phone]- Orthotic/Prosthetic subsequent, [phone]- Aquatic Therapy, [phone]- Electrical stimulation (manual), [phone]- Ultrasound, Patient/Family education, Balance training, Stair training, Taping, Joint mobilization, DME instructions, Cryotherapy, and Moist heat  PLAN FOR NEXT SESSION: Check if received insurance auth for remaining visits; check on HEP/walking program (and progress?); balance; increase B LE foot clearance; obstacle clearance, backwards gait, visually limited conditions, compliant surfaces, blaze pods, terminal hip flexor engagement.   Damien Caulk, PT 12/17/2023, 5:49 PM

## 2023-12-21 ENCOUNTER — Telehealth: Payer: Self-pay | Admitting: Physical Therapy

## 2023-12-21 NOTE — Telephone Encounter (Signed)
 Patient Name: Robert Pacheco MRN: [id number] DOB:24-May-1933, 89 y.o., male Today's Date: 12/21/2023  Therapist called (and talked to) pt.  Pt updated on approved therapy visit for tomorrow and of appointment time.  Damien Caulk, PT 12/21/2023, 6:01 PM

## 2023-12-22 ENCOUNTER — Ambulatory Visit: Admitting: Physical Therapy

## 2023-12-22 ENCOUNTER — Encounter: Payer: Self-pay | Admitting: Physical Therapy

## 2023-12-22 VITALS — BP 151/63 | HR 63

## 2023-12-22 DIAGNOSIS — R2689 Other abnormalities of gait and mobility: Secondary | ICD-10-CM

## 2023-12-22 DIAGNOSIS — Z9181 History of falling: Secondary | ICD-10-CM

## 2023-12-22 DIAGNOSIS — I639 Cerebral infarction, unspecified: Secondary | ICD-10-CM

## 2023-12-22 NOTE — Therapy (Signed)
 OUTPATIENT PHYSICAL THERAPY NEURO TREATMENT   Patient Name: Robert Pacheco MRN: [id number] DOB:20-Sep-1933, 89 y.o., male Today's Date: 12/22/2023   PCP: [name], DO REFERRING PROVIDER: Raenelle Donalda HERO, MD  END OF SESSION:  PT End of Session - 12/22/23 1534     Visit Number 7    Number of Visits 13   12 plus Eval   Date for Recertification  01/22/24   for scheduling delays   Authorization Type [facility] Medicare    Authorization Time Period 11/25/23 - 01/20/24 for 6 visits; 12/22/23 - 02/09/23 for 7 additional visits    Authorization - Visit Number 7    Authorization - Number of Visits 13   6 plus 7   Progress Note Due on Visit 10    PT Start Time 1532    PT Stop Time 1613    PT Time Calculation (min) 41 min    Equipment Utilized During Treatment Gait belt    Activity Tolerance Patient tolerated treatment well    Behavior During Therapy [facility] for tasks assessed/performed           Past Medical History:  Diagnosis Date   Allergy    Coronary artery disease    Hyperlipidemia    Hypertension    Personal history of colonic adenomas 05/10/2012   Skin cancer, basal cell    Past Surgical History:  Procedure Laterality Date   CATARACT EXTRACTION     COLONOSCOPY  multiple   CORONARY ARTERY BYPASS GRAFT  2000   EYE SURGERY     HERNIA REPAIR     MOHS SURGERY     Patient Active Problem List   Diagnosis Date Noted   Transaminitis 11/06/2023   History of TIA (transient ischemic attack) 11/05/2023   Right sided weakness 11/05/2023   Facial droop 11/05/2023   History of atrial tachycardia 11/05/2023   History of CAD (coronary artery disease) 11/05/2023   Acute CVA (cerebrovascular accident) (HCC) 11/05/2023   Elevated liver enzymes 11/05/2023   PSVT (paroxysmal supraventricular tachycardia) 03/27/2021   Senile purpura 09/26/2020   Bilateral leg edema 07/11/2020   Dizziness 07/11/2020   Coronary artery disease involving native coronary artery of native heart  without angina pectoris 07/11/2020   Mixed hyperlipidemia 07/11/2020   Allergy 07/10/2020   Lightheadedness 04/23/2020   Lumbar radiculopathy 08/08/2019   Greater trochanteric pain syndrome of right lower extremity 04/18/2019   Constipation 04/18/2019   Bilateral lower extremity edema 10/15/2018   Displacement of intraocular lens 06/16/2017   Combined forms of age-related cataract of left eye 03/09/2014   Regular astigmatism 03/09/2014   Family history of colon cancer 10/01/2012   History of colonic polyps 05/10/2012   Personal history of colonic adenomas 05/10/2012   BPH (benign prostatic hyperplasia) 03/27/2009   DYSPLASTIC NEVUS, FACE 10/05/2008   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  Mild shoulder achiness after using Sci-Fit last session but did not last long.  Has PCP visit tomorrow (will discuss lightheadedness symptoms that he has in the mornings). Pt accompanied by: self and family member (daughter stayed in waiting room)  PERTINENT HISTORY: Hospitalization 11/04/23 to 11/07/23; pt with R facial droop, dysphagia, and R sided weakness; found to have acute L basal ganglia CVA.  ED visit 11/19/24 d/t sudden onset numbness B UE's/LE's (sx's then resolved; no motor symptoms); imaging showing significant  DDD with spinal cord mass effect--recommended f/u OP with Neurosurgery; no collar necessary per MD [name].  Per follow-up Neurology note from DO [facility] 11/24/23 pt continues to have spells of numbness involving the arms and leg which occurs a few times per week. Symptoms are not constant. He denies arm/leg weakness or imbalance. He has some neck discomfort, which is worse at night time. He also mentions mild numbness around the lips on both side of the mouth. No problems with speech or swallow.   PMH includes CAD s/p CABG, htn, PSVT, HLD.  PAIN:  Are you having pain? No  PRECAUTIONS: Fall; C-spine imaging showing significant DDD with spinal cord mass effect  RED FLAGS: Cervical red flags: Dysphagia No, Dysmetria No, Diplopia No, Nystagmus No, and Nausea No   WEIGHT BEARING RESTRICTIONS: No  FALLS: Has patient fallen in last 6 months? Yes. Number of falls 1 (fell between bed and end table; skin tear to L UE)  LIVING ENVIRONMENT: Lives with: lives alone Lives in: House/apartment Stairs: Yes: External: 2 steps; none Has following equipment at home: Single point cane, Walker - 2 wheeled, shower chair, and Grab bars.  PLOF: Independent.  Hasn't drove lately (waiting until has more energy back).  Mostly uses walker in bedroom.  PATIENT GOALS: Be able to move a little better.  Improve leg strength.  OBJECTIVE:  Note: Objective measures were completed at Evaluation unless otherwise noted.  DIAGNOSTIC FINDINGS:  MRI Brain 11/05/23: Small acute perforator infarct in the left basal ganglia.   MRI c-spine 11/20/23: 1. Multifactorial degenerative cervical spinal stenosis WITH spinal cord mass effect from C2-C3 through C6-C7. No spinal cord edema or myelomalacia identified. 2. 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 3. Degenerative severe right C4 and C7 nerve level foraminal stenosis, moderate at the left C5, right C6, left C7  levels.  COGNITION: Overall cognitive status: Within functional limits for tasks assessed   SENSATION: Intact B LE light touch (pt with self reported decreased sensation below knees to feet B)  COORDINATION: Mild decreased B rapid alternating toe tapping Edema: L LE more edematous than R LE   MUSCLE TONE: Intact B LE's  Edema: 28.8 cm L LE malleolar line    30 cm R LE malleolar line    Chronic LE edema per pt report  POSTURE: rounded shoulders, forward head, and posterior pelvic tilt  LOWER EXTREMITY ROM:     Active  Right Eval Left Eval  Hip flexion WNL WNL  Hip extension    Hip abduction    Hip adduction    Hip internal rotation    Hip external rotation    Knee flexion WNL WNL  Knee extension WNL WNL  Ankle dorsiflexion WNL WNL  Ankle plantarflexion WNL WNL  Ankle inversion    Ankle eversion     (Blank rows = not tested)  LOWER EXTREMITY MMT:    MMT Right Eval Left  Eval  Hip flexion 5/5 4+/5  Hip extension    Hip abduction    Hip adduction    Hip internal rotation    Hip external rotation    Knee flexion 5/5 4+/5  Knee extension 5/5 4+/5  Ankle dorsiflexion At least 3/5 AROM At least 3/5 AROM  Ankle plantarflexion    Ankle inversion    Ankle eversion    (Blank rows = not tested)  TRANSFERS: Use of B UE's Sit to stand: Modified independence  Assistive device utilized: None     Stand to sit: Modified independence  Assistive device utilized: None      GAIT: Findings: Mild flexed posture; decreased B LE step length/foot clearance;  decreased B LE heel-strike (more foot flat); decreased R UE (compared to L UE) arm swing; clinic distances; SBA  FUNCTIONAL TESTS:  5 times sit to stand: 17.28 seconds; B UE support on Eval.  17.94 seconds (pt intermittently using UE's and no UE's) on 12/17/23 10 meter walk test: 0.877 m/sec (11.40 seconds; no AD use) on Eval.. 0.87 m/sec (11.41 seconds) 12/17/23 SLS: 2.78 seconds R LE and 3.43 seconds L LE on Eval.  L LE  6.37 seconds and R LE 13.0 seconds on 12/17/23 FGA: 15/30 (12/01/23); 19/30 on 12/17/23  FUNCTIONAL GAIT ASSESSMENT  Date: 12/17/23 Score  GAIT LEVEL SURFACE Instructions: Walk at your normal speed from here to the next mark (6 m) [20 ft]. (1) Moderate impairment-Walks 6 m (20 ft), slow speed, abnormal gait pattern, evidence for imbalance, or deviates 25.4 - 38.1 cm (10 -15 in) outside of the 30.48-cm (12-in) walkway width. Requires more than 7 seconds to ambulate 6 m (20 ft). (7.88 seconds)  2.   CHANGE IN GAIT SPEED Instructions: Begin walking at your normal pace (for 1.5 m [5 ft]). When I tell you "go," walk as fast as you can (for 1.5 m [5 ft]). When I tell you "slow," walk as slowly as you can (for 1.5 m [5 ft]. (2) Mild impairment - Is able to change speed but demonstrates mild gait deviations, deviates 15.24 -25.4 cm (6 -10 in) outside of the 30.48-cm (12-in) walkway width, or no gait deviations but unable to achieve a significant change in velocity, or uses an assistive device  3.    GAIT WITH HORIZONTAL HEAD TURNS Instructions: Walk from here to the next mark 6 m (20 ft) away. Begin walking at your normal pace. Keep walking straight; after 3 steps, turn your head to the right and keep walking straight while looking to the right. After 3 more steps, turn your head to the left and keep walking straight while looking left. Continue alternating looking right and left. (2) Mild impairment - Performs head turns smoothly with slight change in gait velocity (eg, minor disruption to smooth gait path), deviates 15.24 -25.4 cm (6 -10 in) outside 30.48-cm (12-in) walkway width, or uses an assistive device.  4.   GAIT WITH VERTICAL HEAD TURNS Instructions: Walk from here to the next mark (6 m [20 ft]). Begin walking at your normal pace. Keep walking straight; after 3 steps, tip your head up and keep walking straight while looking up. After 3 more steps, tip your head down, keep walking straight while looking  down. Continue  alternating looking up and down every 3 steps until you have completed 2 repetitions in each direction. (2) Mild impairment - Performs task with slight change in gait velocity (eg, minor disruption to smooth gait path), deviates 15.24 -25.4 cm (  6 -10 in) outside 30.48-cm (12-in) walkway width or uses assistive device.  5.  GAIT AND PIVOT TURN Instructions: Begin with walking at your normal pace. When I tell you, "turn and stop," turn as quickly as you can to face the opposite direction and stop. (3) Normal - Pivot turns safely within 3 seconds and stops quickly with no loss of balance  6.   STEP OVER OBSTACLE Instructions: Begin walking at your normal speed. When you come to the shoe box, step over it, not around it, and keep walking. (2) Mild impairment - Is able to step over one shoe box (11.43 cm [4.5 in] total height) without changing gait speed; no evidence of imbalance.  7.   GAIT WITH NARROW BASE OF SUPPORT Instructions: Walk on the floor with arms folded across the chest, feet aligned heel to toe in tandem for a distance of 3.6 m [12 ft]. The number of steps taken in a straight line are counted for a maximum of 10 steps. (2) Mild impairment - Ambulates 7-9 steps  8.   GAIT WITH EYES CLOSED Instructions: Walk at your normal speed from here to the next mark (6 m [20 ft]) with your eyes closed. (1) Moderate impairment - Walks 6 m (20 ft), slow speed, abnormal gait pattern, evidence for imbalance, deviates 25.4 -38.1 cm (10 -15 in) outside 30.48-cm (12-in) walkway width. Requires more than 9 seconds to ambulate 6 m (20 ft). (10.19 seconds)  9.   AMBULATING BACKWARDS Instructions: Walk backwards until I tell you to stop (2) Mild impairment - Walks 6 m (20 ft), uses assistive device, slower speed, mild gait deviations, deviates 15.24 -25.4 cm (6 -10 in) outside 30.48-cm (12-in) walkway width (16.72 seconds)  10. STEPS Instructions: Walk up these stairs as you would at home (ie, using  the rail if necessary). At the top turn around and walk down. (2) Mild impairment-Alternating feet, must use rail.  Total 19/30   Interpretation of scores: Non-Specific Older Adults Cutoff Score: <=22/30 = risk of falls Parkinson's Disease Cutoff score <15/30= fall risk (Hoehn & Yahr 1-4)  Minimally Clinically Important Difference (MCID)  Stroke (acute, subacute, and chronic) = MDC: 4.2 points Vestibular (acute) = MDC: 6 points Community Dwelling Older Adults =  MCID: 4 points Parkinson's Disease  =  MDC: 4.3 points  (Academy of Neurologic Physical Therapy (nd). Functional Gait Assessment. Retrieved from https://www.neuropt.org/docs/default-source/cpgs/core-outcome-measures/function-gait-assessment-pocket-guide-proof9-(2).pdf?sfvrsn=b38f35043_0.)                                                                                                                       TREATMENT DATE: 12/22/23  Self Care: BP and HR taken in sitting at rest beginning of session (see below for details). Vitals:   12/22/23 1537  BP: (!) 151/63  Pulse: 63   Therapeutic Activity: Balance beam in // bars (purple; foam; 9 feet) x4 reps forwards heel to toe (intermittent UE support; CGA) x4 reps (L/R/L/R) sidestepping with L UE support (CGA) Pt throwing compliant air filled  soft medium sized purple ball at rebounder and then catching ball (pt standing on Airex): x20 reps with feet shoulder width apart x20 reps with feet 2 inches apart x20 reps with feet together Notes: CGA; pt using ankle strategies and able to self correct all 3 trials Toe taps at steps (standing on Airex; CGA): x10 reps R LE to 6 inch step (no UE support) x10 reps L LE to 6 inch step (no UE support) x10 reps R LE to 12 inch step (no UE support) x10 reps L LE to 12 inch step (no UE support); pt mildly catching L foot on step at times Ambulation with black [place] resistance band around pelvis with random mild resistance/perturbations: x230  feet no AD use; CGA  PATIENT EDUCATION: Education details: English as a second language teacher for 7 visits.  Continue HEP and working towards walking program. Person educated: Patient Education method: Programmer, Multimedia, Facilities Manager, and Verbal cues Education comprehension: verbalized understanding, returned demonstration, verbal cues required, and needs further education  HOME EXERCISE PROGRAM: Access Code: [email or web address] URL: [email or web address] Date: 11/25/2023 Prepared by: Damien Caulk  Exercises - Standing March with Counter Support  - 1 x daily - 5 x weekly - 1-2 sets - 10 reps - Heel Raises with Counter Support  - 1 x daily - 5 x weekly - 1-2 sets - 10 reps - Standing Hip Abduction with Counter Support  - 1 x daily - 5 x weekly - 1-2 sets - 10 reps - Backward Walking with Counter Support  - 1 x daily - 5 x weekly - 3 sets - 10 reps - Walking with Eyes Closed and Counter Support  - 1 x daily - 5 x weekly - 3 sets - 10 reps - Tandem Walking with Counter Support  - 1 x daily - 5 x weekly - 3 sets - 10 reps - Walking with Head Rotation  - 1 x daily - 5 x weekly - 3 sets - 10 reps  You Can Walk For A Certain Length Of Time Each Day                          Walk 5 minutes 1-2 times per day.             Increase 1-2  minutes every week             Work up to 20 minutes (1x per day).  GOALS: Goals reviewed with patient? Yes  SHORT TERM GOALS: Target date: 12/23/2023  Pt will be independent with initial HEP in order to improve strength and balance in order to decrease fall risk and improve function at home for ADL's. Baseline: Issued initially 11/25/23 Goal status: MET  2.  Assess FGA and update LTG as needed. Baseline: 15/30 (12/01/23) Goal status: MET  3.  Patient will tolerate 5 seconds of single leg stance without loss of balance B LE's to improve ability to get in and out of shower safely. Baseline: 2.78 seconds R LE and 3.43 seconds L LE on Eval; L LE 6.37 seconds and R LE  13.0 seconds 12/17/23 Goal status: MET  4. Patient will be compliant to formal walking program >/= 3 days per week to improve aerobic tolerance and ambulatory mechanics.  Baseline:  Goal status: PARTIALLY MET (pt using treadmill 3x/week to walk but not doing overground walking program)   LONG TERM GOALS: Target date: 01/06/2024  Pt will be independent with final HEP in order to improve strength and  balance in order to decrease fall risk and improve function at home for ADL's. Baseline: Issued initially 11/25/23 Goal status: INITIAL  2.  Pt will decrease 5 Time Sit to Stand by at least 3 seconds or more in order to demonstrate clinically significant improvement in LE strength. Baseline: 17.28 seconds on Eval with B UE support; 17.94 seconds (pt intermittently using UE's and no UE's) on 12/17/23 Goal status: INITIAL  3.  Pt will increase by at least 0.13 m/s in order to demonstrate clinically significant improvement in community ambulation.  Baseline: 0.877 m/sec on Eval; 0.87 m/sec on 12/17/23 Goal status: INITIAL  4.  Patient will increase Functional Gait Assessment score to >22/30 as to reduce fall risk and improve dynamic gait safety with community ambulation. Baseline: 15/30 (12/01/23); 19/30 on 12/17/23 Goal status: INITIAL  5.  Pt will demonstrate improved B LE foot clearance for safety with gait. Baseline: decreased B LE step length/foot clearance on Eval; pt reporting able to pick them up a little better on 12/17/23 Goal status: INITIAL  ASSESSMENT:  CLINICAL IMPRESSION: Patient was seen today for physical therapy treatment to address balance.  Focused session on improving dynamic balance on compliant surfaces, single leg stability/balance, and balance during ambulation.  Patient continues to be limited by strength, endurance, balance, and foot clearance.  They demonstrate improvement in reaction time/response with balance activities during session.  They would continue  to benefit from skilled PT to address impairments as noted and progress towards long term goals.  OBJECTIVE IMPAIRMENTS: Abnormal gait, cardiopulmonary status limiting activity, decreased activity tolerance, decreased balance, decreased coordination, decreased endurance, decreased knowledge of condition, decreased knowledge of use of DME, decreased mobility, difficulty walking, decreased strength, and postural dysfunction.   ACTIVITY LIMITATIONS: carrying, lifting, standing, squatting, stairs, transfers, toileting, dressing, and locomotion level  PARTICIPATION LIMITATIONS: cleaning, laundry, driving, shopping, and community activity  PERSONAL FACTORS: Age, Fitness, Past/current experiences, and 1-2 comorbidities: CAD s/p CABG, htn, PSVT are also affecting patient's functional outcome.   REHAB POTENTIAL: Good  CLINICAL DECISION MAKING: Evolving/moderate complexity  EVALUATION COMPLEXITY: Moderate  PLAN:  PT FREQUENCY: 2x/week  PT DURATION: 6 weeks  PLANNED INTERVENTIONS: 97164- PT Re-evaluation, 97750- Physical Performance Testing, 97110-Therapeutic exercises, 97530- Therapeutic activity, [id number]- Neuromuscular re-education, 97535- Self Care, [id number]- Manual therapy, [id number]- Gait training, [phone]- Orthotic Initial, [phone]- Orthotic/Prosthetic subsequent, [phone]- Aquatic Therapy, [phone]- Electrical stimulation (manual), [phone]- Ultrasound, Patient/Family education, Balance training, Stair training, Taping, Joint mobilization, DME instructions, Cryotherapy, and Moist heat  PLAN FOR NEXT SESSION: Check on HEP/walking program (and progress?); balance; increase B LE foot clearance; obstacle clearance, backwards gait, visually limited conditions, compliant surfaces, blaze pods, terminal hip flexor engagement.   Damien Caulk, PT 12/22/2023, 7:22 PM

## 2023-12-23 ENCOUNTER — Ambulatory Visit: Payer: Self-pay | Admitting: Family Medicine

## 2023-12-23 ENCOUNTER — Other Ambulatory Visit (INDEPENDENT_AMBULATORY_CARE_PROVIDER_SITE_OTHER)

## 2023-12-23 ENCOUNTER — Other Ambulatory Visit: Payer: Self-pay | Admitting: Family Medicine

## 2023-12-23 DIAGNOSIS — I10 Essential (primary) hypertension: Secondary | ICD-10-CM

## 2023-12-23 LAB — COMPREHENSIVE METABOLIC PANEL WITH GFR
ALT: 9 U/L (ref 0–53)
AST: 14 U/L (ref 0–37)
Albumin: 4.5 g/dL (ref 3.5–5.2)
Alkaline Phosphatase: 79 U/L (ref 39–117)
BUN: 17 mg/dL (ref 6–23)
CO2: 27 meq/L (ref 19–32)
Calcium: 9.4 mg/dL (ref 8.4–10.5)
Chloride: 105 meq/L (ref 96–112)
Creatinine, Ser: 1.03 mg/dL (ref 0.40–1.50)
GFR: 64.81 mL/min (ref >60.00)
Glucose, Bld: 122 mg/dL — ABNORMAL HIGH (ref 70–99)
Potassium: 4.6 meq/L (ref 3.5–5.1)
Sodium: 140 meq/L (ref 135–145)
Total Bilirubin: 0.8 mg/dL (ref 0.2–1.2)
Total Protein: 6.9 g/dL (ref 6.0–8.3)

## 2023-12-23 LAB — LIPID PANEL
Cholesterol: 120 mg/dL (ref 0–200)
HDL: 38.3 mg/dL — ABNORMAL LOW (ref >39.00)
LDL Cholesterol: 51 mg/dL (ref 0–99)
NonHDL: 82
Total CHOL/HDL Ratio: 3
Triglycerides: 156 mg/dL — ABNORMAL HIGH (ref 0.0–149.0)
VLDL: 31.2 mg/dL (ref 0.0–40.0)

## 2023-12-23 MED ORDER — METOPROLOL SUCCINATE ER 25 MG PO TB24: 12.5000 mg | ORAL_TABLET | Freq: Every day | ORAL | 0 refills | Status: DC

## 2023-12-23 NOTE — Telephone Encounter (Signed)
 Copied from CRM #[id number]. Topic: Clinical - Medication Refill >> Dec 23, 2023 11:44 AM Shereese L wrote: Medication: metoprolol  succinate (TOPROL -XL) 25 MG 24 hr tablet  Has the patient contacted their pharmacy? Yes (Agent: If no, request that the patient contact the pharmacy for the refill. If patient does not wish to contact the pharmacy document the reason why and proceed with request.) (Agent: If yes, when and what did the pharmacy advise?)  This is the patient's preferred pharmacy:  [facility] [address] [place] - [address] [place]  [place] [address] Phone: [phone] Fax: [phone]  Is this the correct pharmacy for this prescription? Yes If no, delete pharmacy and type the correct one.   Has the prescription been filled recently? Yes  Is the patient out of the medication? Yes  Has the patient been seen for an appointment in the last year OR does the patient have an upcoming appointment? Yes  Can we respond through MyChart? Yes  Agent: Please be advised that Rx refills may take up to 3 business days. We ask that you follow-up with your pharmacy.

## 2023-12-28 ENCOUNTER — Ambulatory Visit: Admitting: Family Medicine

## 2023-12-28 ENCOUNTER — Encounter: Payer: Self-pay | Admitting: Family Medicine

## 2023-12-28 VITALS — BP 128/70 | HR 100 | Temp 98.0°F | Resp 16 | Ht 67.0 in | Wt 180.2 lb

## 2023-12-28 DIAGNOSIS — R42 Dizziness and giddiness: Secondary | ICD-10-CM

## 2023-12-28 DIAGNOSIS — Z23 Encounter for immunization: Secondary | ICD-10-CM | POA: Diagnosis not present

## 2023-12-28 DIAGNOSIS — K5900 Constipation, unspecified: Secondary | ICD-10-CM | POA: Diagnosis not present

## 2023-12-28 NOTE — Addendum Note (Signed)
 Addended by: Constance Hackenberg M on: 12/28/2023 11:37 AM   Modules accepted: Orders

## 2023-12-28 NOTE — Progress Notes (Signed)
 Chief Complaint  Patient presents with   GI Problem    Gi Problem    Subjective: Patient is a 89 y.o. male here for continued constipation.  He won't have a BM for 4-5 days. He will take a laxative and finally resolve the issue. Stool is typically large in quantity. Sometimes it is hard/small. Stays hydrated. Diet is fair. Denies pain bleeding, leakage, N/V. He takes Metamucil daily.   He gets lightheaded when he gets up in the AM. He seems to improve throughout the day. Pt stays relatively well hydrated. Eating normally. Eating does not resolve the issue. No LOC or passing out. No CP or SOB. This has been going on for around 1 mo. No med/supplement changes leading into this.   Past Medical History:  Diagnosis Date   Allergy    Coronary artery disease    Hyperlipidemia    Hypertension    Personal history of colonic adenomas 05/10/2012   Skin cancer, basal cell     Objective: BP 128/70 (BP Location: Left Arm, Patient Position: Sitting)   Pulse 100   Temp 98 F (36.7 C) (Oral)   Resp 16   Ht 5' 7 (1.702 m)   Wt 180 lb 3.2 oz (81.7 kg)   SpO2 98%   BMI 28.22 kg/m  General: Awake, appears stated age Heart: RRR, no bruits Lungs: CTAB, no rales, wheezes or rhonchi. No accessory muscle use Abd: BS+, mildly distended, no TTP, no masses Neuro: Speech is fluent, gait is slow/cautious.  Psych: Age appropriate judgment and insight, normal affect and mood  Assessment and Plan: Constipation, unspecified constipation type  Lightheadedness  Possibly adverse effect of chronic medication.  He will stop his Toprol  XL 12.5 mg daily and send a message in 1 to 2 weeks to update us  with how things are going.  Needs to stay hydrated.  55-60 ounces of water daily recommended.  Continue Metamucil daily.  Okay to use a laxative every 2 to 3 days as needed.  Follow-up pending the above. Flu shot today. The patient voiced understanding and agreement to the plan.  [name] Merritt,  DO 12/28/23  11:31 AM

## 2023-12-28 NOTE — Patient Instructions (Addendum)
 Call in a week to update us  with how you are doing with the lightheadedness and constipation.  Try to drink 55-60 oz of water daily outside of exercise.  Continue Metamucil.   Hold the Toprol  for now.   OK to use a laxative like MiraLAX /polyethylene glycol every 2-3 days as needed.   Let us  know if you need anything.

## 2023-12-29 ENCOUNTER — Ambulatory Visit: Admitting: Physical Therapy

## 2023-12-29 ENCOUNTER — Encounter: Payer: Self-pay | Admitting: Physical Therapy

## 2023-12-29 VITALS — BP 150/64 | HR 72

## 2023-12-29 DIAGNOSIS — Z9181 History of falling: Secondary | ICD-10-CM | POA: Insufficient documentation

## 2023-12-29 DIAGNOSIS — R2689 Other abnormalities of gait and mobility: Secondary | ICD-10-CM | POA: Diagnosis present

## 2023-12-29 DIAGNOSIS — I639 Cerebral infarction, unspecified: Secondary | ICD-10-CM | POA: Insufficient documentation

## 2023-12-29 NOTE — Therapy (Signed)
 OUTPATIENT PHYSICAL THERAPY NEURO TREATMENT   Patient Name: Robert Pacheco MRN: [id number] DOB:[birth year], 89 y.o., male Today's Date: 12/29/2023   PCP: [name], DO REFERRING PROVIDER: Raenelle Donalda HERO, MD  END OF SESSION:  PT End of Session - 12/29/23 1417     Visit Number 8    Number of Visits 13   12 plus Eval   Date for Recertification  01/22/24   for scheduling delays   Authorization Type [facility] Medicare    Authorization Time Period 11/25/23 - 01/20/24 for 6 visits; 12/22/23 - 02/09/23 for 7 additional visits    Authorization - Visit Number 8    Authorization - Number of Visits 13   6 plus 7   Progress Note Due on Visit 10    PT Start Time 1409    PT Stop Time 1450    PT Time Calculation (min) 41 min    Equipment Utilized During Treatment Gait belt    Activity Tolerance Patient tolerated treatment well    Behavior During Therapy [facility] for tasks assessed/performed           Past Medical History:  Diagnosis Date   Allergy    Coronary artery disease    Hyperlipidemia    Hypertension    Personal history of colonic adenomas 05/10/2012   Skin cancer, basal cell    Past Surgical History:  Procedure Laterality Date   CATARACT EXTRACTION     COLONOSCOPY  multiple   CORONARY ARTERY BYPASS GRAFT  2000   EYE SURGERY     HERNIA REPAIR     MOHS SURGERY     Patient Active Problem List   Diagnosis Date Noted   Transaminitis 11/06/2023   History of TIA (transient ischemic attack) 11/05/2023   Right sided weakness 11/05/2023   Facial droop 11/05/2023   History of atrial tachycardia 11/05/2023   History of CAD (coronary artery disease) 11/05/2023   Acute CVA (cerebrovascular accident) (HCC) 11/05/2023   Elevated liver enzymes 11/05/2023   PSVT (paroxysmal supraventricular tachycardia) 03/27/2021   Senile purpura 09/26/2020   Bilateral leg edema 07/11/2020   Dizziness 07/11/2020   Coronary artery disease involving native coronary artery of native heart  without angina pectoris 07/11/2020   Mixed hyperlipidemia 07/11/2020   Allergy 07/10/2020   Lightheadedness 04/23/2020   Lumbar radiculopathy 08/08/2019   Greater trochanteric pain syndrome of right lower extremity 04/18/2019   Constipation 04/18/2019   Bilateral lower extremity edema 10/15/2018   Displacement of intraocular lens 06/16/2017   Combined forms of age-related cataract of left eye 03/09/2014   Regular astigmatism 03/09/2014   Family history of colon cancer 10/01/2012   History of colonic polyps 05/10/2012   Personal history of colonic adenomas 05/10/2012   BPH (benign prostatic hyperplasia) 03/27/2009   DYSPLASTIC NEVUS, FACE 10/05/2008   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  No pain currently but some mild light headedness.  He reports doctor took him off one of his medicines and he will see PCP again Monday. Pt accompanied by: self and family member (daughter stayed in waiting room)  PERTINENT HISTORY: Hospitalization 11/04/23 to 11/07/23; pt with R facial droop, dysphagia, and R sided weakness; found to have acute L basal ganglia CVA.  ED visit 11/19/24 d/t sudden onset numbness B UE's/LE's (sx's then resolved; no motor symptoms); imaging showing significant DDD with spinal cord mass  effect--recommended f/u OP with Neurosurgery; no collar necessary per MD [name].  Per follow-up Neurology note from DO [facility] 11/24/23 pt continues to have spells of numbness involving the arms and leg which occurs a few times per week. Symptoms are not constant. He denies arm/leg weakness or imbalance. He has some neck discomfort, which is worse at night time. He also mentions mild numbness around the lips on both side of the mouth. No problems with speech or swallow.   PMH includes CAD s/p CABG, htn, PSVT, HLD.  PAIN:  Are you having pain? No  PRECAUTIONS: Fall; C-spine imaging showing significant DDD with spinal cord mass effect  RED FLAGS: Cervical red flags: Dysphagia No, Dysmetria No, Diplopia No, Nystagmus No, and Nausea No   WEIGHT BEARING RESTRICTIONS: No  FALLS: Has patient fallen in last 6 months? Yes. Number of falls 1 (fell between bed and end table; skin tear to L UE)  LIVING ENVIRONMENT: Lives with: lives alone Lives in: House/apartment Stairs: Yes: External: 2 steps; none Has following equipment at home: Single point cane, Walker - 2 wheeled, shower chair, and Grab bars.  PLOF: Independent.  Hasn't drove lately (waiting until has more energy back).  Mostly uses walker in bedroom.  PATIENT GOALS: Be able to move a little better.  Improve leg strength.  OBJECTIVE:  Note: Objective measures were completed at Evaluation unless otherwise noted.  DIAGNOSTIC FINDINGS:  MRI Brain 11/05/23: Small acute perforator infarct in the left basal ganglia.   MRI c-spine 11/20/23: 1. Multifactorial degenerative cervical spinal stenosis WITH spinal cord mass effect from C2-C3 through C6-C7. No spinal cord edema or myelomalacia identified. 2. 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 3. Degenerative severe right C4 and C7 nerve level foraminal stenosis, moderate at the left C5, right C6, left C7  levels.  COGNITION: Overall cognitive status: Within functional limits for tasks assessed   SENSATION: Intact B LE light touch (pt with self reported decreased sensation below knees to feet B)  COORDINATION: Mild decreased B rapid alternating toe tapping Edema: L LE more edematous than R LE   MUSCLE TONE: Intact B LE's  Edema: 28.8 cm L LE malleolar line    30 cm R LE malleolar line    Chronic LE edema per pt report  POSTURE: rounded shoulders, forward head, and posterior pelvic tilt  LOWER EXTREMITY ROM:     Active  Right Eval Left Eval  Hip flexion WNL WNL  Hip extension    Hip abduction    Hip adduction    Hip internal rotation    Hip external rotation    Knee flexion WNL WNL  Knee extension WNL WNL  Ankle dorsiflexion WNL WNL  Ankle plantarflexion WNL WNL  Ankle inversion    Ankle eversion     (Blank rows = not tested)  LOWER EXTREMITY MMT:    MMT Right Eval Left Eval  Hip flexion 5/5 4+/5  Hip extension    Hip abduction    Hip adduction    Hip internal rotation    Hip external rotation    Knee flexion 5/5 4+/5  Knee extension 5/5 4+/5  Ankle dorsiflexion At least 3/5 AROM At least 3/5 AROM  Ankle plantarflexion    Ankle inversion    Ankle eversion    (Blank rows = not tested)  TRANSFERS: Use of B UE's Sit to stand: Modified independence  Assistive device utilized: None     Stand to sit: Modified independence  Assistive device utilized: None      GAIT: Findings: Mild flexed posture; decreased B LE step length/foot clearance;  decreased B LE heel-strike (more foot flat); decreased R UE (compared to L UE) arm swing; clinic distances; SBA  FUNCTIONAL TESTS:  5 times sit to stand: 17.28 seconds; B UE support on Eval.  17.94 seconds (pt intermittently using UE's and no UE's) on 12/17/23 10 meter walk test: 0.877 m/sec (11.40 seconds; no AD use) on Eval.. 0.87 m/sec (11.41 seconds) 12/17/23 SLS: 2.78 seconds R LE and 3.43 seconds L LE on Eval.  L LE  6.37 seconds and R LE 13.0 seconds on 12/17/23 FGA: 15/30 (12/01/23); 19/30 on 12/17/23  FUNCTIONAL GAIT ASSESSMENT  Date: 12/17/23 Score  GAIT LEVEL SURFACE Instructions: Walk at your normal speed from here to the next mark (6 m) [20 ft]. (1) Moderate impairment-Walks 6 m (20 ft), slow speed, abnormal gait pattern, evidence for imbalance, or deviates 25.4 - 38.1 cm (10 -15 in) outside of the 30.48-cm (12-in) walkway width. Requires more than 7 seconds to ambulate 6 m (20 ft). (7.88 seconds)  2.   CHANGE IN GAIT SPEED Instructions: Begin walking at your normal pace (for 1.5 m [5 ft]). When I tell you "go," walk as fast as you can (for 1.5 m [5 ft]). When I tell you "slow," walk as slowly as you can (for 1.5 m [5 ft]. (2) Mild impairment - Is able to change speed but demonstrates mild gait deviations, deviates 15.24 -25.4 cm (6 -10 in) outside of the 30.48-cm (12-in) walkway width, or no gait deviations but unable to achieve a significant change in velocity, or uses an assistive device  3.    GAIT WITH HORIZONTAL HEAD TURNS Instructions: Walk from here to the next mark 6 m (20 ft) away. Begin walking at your normal pace. Keep walking straight; after 3 steps, turn your head to the right and keep walking straight while looking to the right. After 3 more steps, turn your head to the left and keep walking straight while looking left. Continue alternating looking right and left. (2) Mild impairment - Performs head turns smoothly with slight change in gait velocity (eg, minor disruption to smooth gait path), deviates 15.24 -25.4 cm (6 -10 in) outside 30.48-cm (12-in) walkway width, or uses an assistive device.  4.   GAIT WITH VERTICAL HEAD TURNS Instructions: Walk from here to the next mark (6 m [20 ft]). Begin walking at your normal pace. Keep walking straight; after 3 steps, tip your head up and keep walking straight while looking up. After 3 more steps, tip your head down, keep walking straight while looking  down. Continue  alternating looking up and down every 3 steps until you have completed 2 repetitions in each direction. (2) Mild impairment - Performs task with slight change in gait velocity (eg, minor disruption to smooth gait path), deviates 15.24 -25.4 cm (6 -10  in) outside 30.48-cm (12-in) walkway width or uses assistive device.  5.  GAIT AND PIVOT TURN Instructions: Begin with walking at your normal pace. When I tell you, "turn and stop," turn as quickly as you can to face the opposite direction and stop. (3) Normal - Pivot turns safely within 3 seconds and stops quickly with no loss of balance  6.   STEP OVER OBSTACLE Instructions: Begin walking at your normal speed. When you come to the shoe box, step over it, not around it, and keep walking. (2) Mild impairment - Is able to step over one shoe box (11.43 cm [4.5 in] total height) without changing gait speed; no evidence of imbalance.  7.   GAIT WITH NARROW BASE OF SUPPORT Instructions: Walk on the floor with arms folded across the chest, feet aligned heel to toe in tandem for a distance of 3.6 m [12 ft]. The number of steps taken in a straight line are counted for a maximum of 10 steps. (2) Mild impairment - Ambulates 7-9 steps  8.   GAIT WITH EYES CLOSED Instructions: Walk at your normal speed from here to the next mark (6 m [20 ft]) with your eyes closed. (1) Moderate impairment - Walks 6 m (20 ft), slow speed, abnormal gait pattern, evidence for imbalance, deviates 25.4 -38.1 cm (10 -15 in) outside 30.48-cm (12-in) walkway width. Requires more than 9 seconds to ambulate 6 m (20 ft). (10.19 seconds)  9.   AMBULATING BACKWARDS Instructions: Walk backwards until I tell you to stop (2) Mild impairment - Walks 6 m (20 ft), uses assistive device, slower speed, mild gait deviations, deviates 15.24 -25.4 cm (6 -10 in) outside 30.48-cm (12-in) walkway width (16.72 seconds)  10. STEPS Instructions: Walk up these stairs as you would at home (ie, using  the rail if necessary). At the top turn around and walk down. (2) Mild impairment-Alternating feet, must use rail.  Total 19/30   Interpretation of scores: Non-Specific Older Adults Cutoff Score: <=22/30 = risk of falls Parkinson's Disease Cutoff score <15/30= fall risk (Hoehn & Yahr 1-4)  Minimally Clinically Important Difference (MCID)  Stroke (acute, subacute, and chronic) = MDC: 4.2 points Vestibular (acute) = MDC: 6 points Community Dwelling Older Adults =  MCID: 4 points Parkinson's Disease  =  MDC: 4.3 points  (Academy of Neu[employer]ic Physical Therapy (nd). Functional Gait Assessment. Retrieved from https://www.neuropt.org/docs/default-source/cpgs/core-outcome-measures/function-gait-assessment-pocket-guide-proof9-(2).pdf?sfvrsn=b50f35043_0.)                                                                                                                       TREATMENT DATE: 12/29/23  Self Care: L BP and HR taken in sitting at rest beginning of session (see below for details). Vitals:   12/29/23 1414  BP: (!) 150/64  Pulse: 72   Therapeutic Activity: SciFit multi-peaks mode up to level 5.0 x8 minutes using BUE/BLE for dynamic HIIT style workout cued to maintain stride of 10-12 inches with 11.4 average achieved.  NMR: Foam beam step up w/  contralateral hip drive alt LE [id number] progressing to LUE support only SBA-CGA 4 hurdles on foam beam 4x10 ft w/ BUE support > added manual dual task of bean bag retrieval and carry across beam and hurdles to basket (increased cognitive demand by increasing number of bags carried unilaterally w/ contralateral UE support on ballet bar) 5x10 ft each direction SBA  PATIENT EDUCATION: Education details: Continue HEP and working towards walking program.  Discussed goal of strengthening to improve knee pain with movement as pt expresses concern and interest in better managing LLE pain particularly of the knee from old injury. Person educated:  Patient Education method: Explanation, Demonstration, and Verbal cues Education comprehension: verbalized understanding, returned demonstration, verbal cues required, and needs further education  HOME EXERCISE PROGRAM: Access Code: [email or web address] URL: [email or web address] Date: 11/25/2023 Prepared by: Damien Caulk  Exercises - Standing March with Counter Support  - 1 x daily - 5 x weekly - 1-2 sets - 10 reps - Heel Raises with Counter Support  - 1 x daily - 5 x weekly - 1-2 sets - 10 reps - Standing Hip Abduction with Counter Support  - 1 x daily - 5 x weekly - 1-2 sets - 10 reps - Backward Walking with Counter Support  - 1 x daily - 5 x weekly - 3 sets - 10 reps - Walking with Eyes Closed and Counter Support  - 1 x daily - 5 x weekly - 3 sets - 10 reps - Tandem Walking with Counter Support  - 1 x daily - 5 x weekly - 3 sets - 10 reps - Walking with Head Rotation  - 1 x daily - 5 x weekly - 3 sets - 10 reps  You Can Walk For A Certain Length Of Time Each Day                          Walk 5 minutes 1-2 times per day.             Increase 1-2  minutes every week             Work up to 20 minutes (1x per day).  GOALS: Goals reviewed with patient? Yes  SHORT TERM GOALS: Target date: 12/23/2023  Pt will be independent with initial HEP in order to improve strength and balance in order to decrease fall risk and improve function at home for ADL's. Baseline: Issued initially 11/25/23 Goal status: MET  2.  Assess FGA and update LTG as needed. Baseline: 15/30 (12/01/23) Goal status: MET  3.  Patient will tolerate 5 seconds of single leg stance without loss of balance B LE's to improve ability to get in and out of shower safely. Baseline: 2.78 seconds R LE and 3.43 seconds L LE on Eval; L LE 6.37 seconds and R LE 13.0 seconds 12/17/23 Goal status: MET  4. Patient will be compliant to formal walking program >/= 3 days per week to improve aerobic tolerance and ambulatory  mechanics.  Baseline:  Goal status: PARTIALLY MET (pt using treadmill 3x/week to walk but not doing overground walking program)   LONG TERM GOALS: Target date: 01/06/2024  Pt will be independent with final HEP in order to improve strength and balance in order to decrease fall risk and improve function at home for ADL's. Baseline: Issued initially 11/25/23 Goal status: INITIAL  2.  Pt will decrease 5 Time Sit to Stand by at least 3 seconds or more in order to demonstrate  clinically significant improvement in LE strength. Baseline: 17.28 seconds on Eval with B UE support; 17.94 seconds (pt intermittently using UE's and no UE's) on 12/17/23 Goal status: INITIAL  3.  Pt will increase by at least 0.13 m/s in order to demonstrate clinically significant improvement in community ambulation.  Baseline: 0.877 m/sec on Eval; 0.87 m/sec on 12/17/23 Goal status: INITIAL  4.  Patient will increase Functional Gait Assessment score to >22/30 as to reduce fall risk and improve dynamic gait safety with community ambulation. Baseline: 15/30 (12/01/23); 19/30 on 12/17/23 Goal status: INITIAL  5.  Pt will demonstrate improved B LE foot clearance for safety with gait. Baseline: decreased B LE step length/foot clearance on Eval; pt reporting able to pick them up a little better on 12/17/23 Goal status: INITIAL  ASSESSMENT:  CLINICAL IMPRESSION: Focus of skilled PT session today on progressing work on terminal hip flexion strength.  Added dual tasking and compliant surface challenges to improve movement patterns and retention with more complex tasks.  He does well with these today still demonstrating mild circumduction pattern with high cognitive load tasks, but better self-monitoring and correction.  He would continue to benefit from skilled PT to address impairments as noted and progress towards long term goals.  OBJECTIVE IMPAIRMENTS: Abnormal gait, cardiopulmonary status limiting activity, decreased  activity tolerance, decreased balance, decreased coordination, decreased endurance, decreased knowledge of condition, decreased knowledge of use of DME, decreased mobility, difficulty walking, decreased strength, and postural dysfunction.   ACTIVITY LIMITATIONS: carrying, lifting, standing, squatting, stairs, transfers, toileting, dressing, and locomotion level  PARTICIPATION LIMITATIONS: cleaning, laundry, driving, shopping, and community activity  PERSONAL FACTORS: Age, Fitness, Past/current experiences, and 1-2 comorbidities: CAD s/p CABG, htn, PSVT are also affecting patient's functional outcome.   REHAB POTENTIAL: Good  CLINICAL DECISION MAKING: Evolving/moderate complexity  EVALUATION COMPLEXITY: Moderate  PLAN:  PT FREQUENCY: 2x/week  PT DURATION: 6 weeks  PLANNED INTERVENTIONS: [id number]- PT Re-evaluation, 97750- Physical Performance Testing, 97110-Therapeutic exercises, 97530- Therapeutic activity, [id number]- Neuromuscular re-education, 97535- Self Care, [id number]- Manual therapy, [id number]- Gait training, [phone]- Orthotic Initial, [phone]- Orthotic/Prosthetic subsequent, [phone]- Aquatic Therapy, [phone]- Electrical stimulation (manual), [phone]- Ultrasound, Patient/Family education, Balance training, Stair training, Taping, Joint mobilization, DME instructions, Cryotherapy, and Moist heat  PLAN FOR NEXT SESSION: Check on HEP/walking program (and progress?); balance; increase B LE foot clearance; obstacle clearance, backwards gait, visually limited conditions, compliant surfaces, blaze pods, terminal hip flexor engagement.  Stride length.   Daved KATHEE Bull, PT, DPT 12/29/2023, 2:58 PM

## 2023-12-31 ENCOUNTER — Ambulatory Visit: Admitting: Physical Therapy

## 2023-12-31 ENCOUNTER — Encounter: Payer: Self-pay | Admitting: Physical Therapy

## 2023-12-31 VITALS — BP 142/69 | HR 73

## 2023-12-31 DIAGNOSIS — R2689 Other abnormalities of gait and mobility: Secondary | ICD-10-CM

## 2023-12-31 DIAGNOSIS — Z9181 History of falling: Secondary | ICD-10-CM

## 2023-12-31 DIAGNOSIS — I639 Cerebral infarction, unspecified: Secondary | ICD-10-CM

## 2023-12-31 NOTE — Therapy (Signed)
 OUTPATIENT PHYSICAL THERAPY NEURO TREATMENT   Patient Name: CEDRIC MCCLAINE MRN: [id number] DOB:12-30-1933, 89 y.o., male Today's Date: 12/31/2023   PCP: [name], DO REFERRING PROVIDER: Raenelle Donalda HERO, MD  END OF SESSION:  PT End of Session - 12/31/23 1454     Visit Number 9    Number of Visits 13   12 plus Eval   Date for Recertification  01/22/24   for scheduling delays   Authorization Type [facility] Medicare    Authorization Time Period 11/25/23 - 01/20/24 for 6 visits; 12/22/23 - 02/09/23 for 7 additional visits    Authorization - Visit Number 9    Authorization - Number of Visits 13   6 plus 7   Progress Note Due on Visit 10    PT Start Time 1452   pt arrived late   PT Stop Time 1534    PT Time Calculation (min) 42 min    Equipment Utilized During Treatment Gait belt    Activity Tolerance Patient tolerated treatment well    Behavior During Therapy [facility] for tasks assessed/performed           Past Medical History:  Diagnosis Date   Allergy    Coronary artery disease    Hyperlipidemia    Hypertension    Personal history of colonic adenomas 05/10/2012   Skin cancer, basal cell    Past Surgical History:  Procedure Laterality Date   CATARACT EXTRACTION     COLONOSCOPY  multiple   CORONARY ARTERY BYPASS GRAFT  2000   EYE SURGERY     HERNIA REPAIR     MOHS SURGERY     Patient Active Problem List   Diagnosis Date Noted   Transaminitis 11/06/2023   History of TIA (transient ischemic attack) 11/05/2023   Right sided weakness 11/05/2023   Facial droop 11/05/2023   History of atrial tachycardia 11/05/2023   History of CAD (coronary artery disease) 11/05/2023   Acute CVA (cerebrovascular accident) (HCC) 11/05/2023   Elevated liver enzymes 11/05/2023   PSVT (paroxysmal supraventricular tachycardia) 03/27/2021   Senile purpura 09/26/2020   Bilateral leg edema 07/11/2020   Dizziness 07/11/2020   Coronary artery disease involving native coronary artery of  native heart without angina pectoris 07/11/2020   Mixed hyperlipidemia 07/11/2020   Allergy 07/10/2020   Lightheadedness 04/23/2020   Lumbar radiculopathy 08/08/2019   Greater trochanteric pain syndrome of right lower extremity 04/18/2019   Constipation 04/18/2019   Bilateral lower extremity edema 10/15/2018   Displacement of intraocular lens 06/16/2017   Combined forms of age-related cataract of left eye 03/09/2014   Regular astigmatism 03/09/2014   Family history of colon cancer 10/01/2012   History of colonic polyps 05/10/2012   Personal history of colonic adenomas 05/10/2012   BPH (benign prostatic hyperplasia) 03/27/2009   DYSPLASTIC NEVUS, FACE 10/05/2008   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  Lightheadedness better since pt stopped taking metoprolol  medication.  Isn't doing walking program but has gone on treadmill for 5-10 minutes at a time.  Is doing HEP; no question/concerns. Pt accompanied by: self and family member (daughter stayed in waiting room)  PERTINENT HISTORY: Hospitalization 11/04/23 to 11/07/23; pt with R facial droop, dysphagia, and R sided weakness; found to have acute L basal ganglia CVA.  ED visit 11/19/24 d/t sudden onset numbness B UE's/LE's (sx's then resolved; no motor  symptoms); imaging showing significant DDD with spinal cord mass effect--recommended f/u OP with Neurosurgery; no collar necessary per MD [name].  Per follow-up Neurology note from DO [facility] 11/24/23 pt continues to have spells of numbness involving the arms and leg which occurs a few times per week. Symptoms are not constant. He denies arm/leg weakness or imbalance. He has some neck discomfort, which is worse at night time. He also mentions mild numbness around the lips on both side of the mouth. No problems with speech or swallow.   PMH includes CAD s/p CABG, htn, PSVT, HLD.  PAIN:  Are you having pain? No  PRECAUTIONS: Fall; C-spine imaging showing significant DDD with spinal cord mass effect  RED FLAGS: Cervical red flags: Dysphagia No, Dysmetria No, Diplopia No, Nystagmus No, and Nausea No   WEIGHT BEARING RESTRICTIONS: No  FALLS: Has patient fallen in last 6 months? Yes. Number of falls 1 (fell between bed and end table; skin tear to L UE)  LIVING ENVIRONMENT: Lives with: lives alone Lives in: House/apartment Stairs: Yes: External: 2 steps; none Has following equipment at home: Single point cane, Walker - 2 wheeled, shower chair, and Grab bars.  PLOF: Independent.  Hasn't drove lately (waiting until has more energy back).  Mostly uses walker in bedroom.  PATIENT GOALS: Be able to move a little better.  Improve leg strength.  OBJECTIVE:  Note: Objective measures were completed at Evaluation unless otherwise noted.  DIAGNOSTIC FINDINGS:  MRI Brain 11/05/23: Small acute perforator infarct in the left basal ganglia.   MRI c-spine 11/20/23: 1. Multifactorial degenerative cervical spinal stenosis WITH spinal cord mass effect from C2-C3 through C6-C7. No spinal cord edema or myelomalacia identified. 2. 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 3. Degenerative severe right C4 and C7 nerve level foraminal stenosis,  moderate at the left C5, right C6, left C7 levels.  COGNITION: Overall cognitive status: Within functional limits for tasks assessed   SENSATION: Intact B LE light touch (pt with self reported decreased sensation below knees to feet B)  COORDINATION: Mild decreased B rapid alternating toe tapping Edema: L LE more edematous than R LE   MUSCLE TONE: Intact B LE's  Edema: 28.8 cm L LE malleolar line    30 cm R LE malleolar line    Chronic LE edema per pt report  POSTURE: rounded shoulders, forward head, and posterior pelvic tilt  LOWER EXTREMITY ROM:     Active  Right Eval Left Eval  Hip flexion WNL WNL  Hip extension    Hip abduction    Hip adduction    Hip internal rotation    Hip external rotation    Knee flexion WNL WNL  Knee extension WNL WNL  Ankle dorsiflexion WNL WNL  Ankle plantarflexion WNL WNL  Ankle inversion    Ankle eversion     (Blank rows = not tested)  LOWER EXTREMITY MMT:  MMT Right Eval Left Eval  Hip flexion 5/5 4+/5  Hip extension    Hip abduction    Hip adduction    Hip internal rotation    Hip external rotation    Knee flexion 5/5 4+/5  Knee extension 5/5 4+/5  Ankle dorsiflexion At least 3/5 AROM At least 3/5 AROM  Ankle plantarflexion    Ankle inversion    Ankle eversion    (Blank rows = not tested)  TRANSFERS: Use of B UE's Sit to stand: Modified independence  Assistive device utilized: None     Stand to sit: Modified independence  Assistive device utilized: None      GAIT: Findings: Mild flexed posture; decreased B LE step length/foot clearance;  decreased B LE heel-strike (more foot flat); decreased R UE (compared to L UE) arm swing; clinic distances; SBA  FUNCTIONAL TESTS:  5 times sit to stand: 17.28 seconds; B UE support on Eval.  17.94 seconds (pt intermittently using UE's and no UE's) on 12/17/23 10 meter walk test: 0.877 m/sec (11.40 seconds; no AD use) on Eval.. 0.87 m/sec (11.41 seconds) 12/17/23 SLS: 2.78  seconds R LE and 3.43 seconds L LE on Eval.  L LE 6.37 seconds and R LE 13.0 seconds on 12/17/23 FGA: 15/30 (12/01/23); 19/30 on 12/17/23  FUNCTIONAL GAIT ASSESSMENT  Date: 12/17/23 Score  GAIT LEVEL SURFACE Instructions: Walk at your normal speed from here to the next mark (6 m) [20 ft]. (1) Moderate impairment-Walks 6 m (20 ft), slow speed, abnormal gait pattern, evidence for imbalance, or deviates 25.4 - 38.1 cm (10 -15 in) outside of the 30.48-cm (12-in) walkway width. Requires more than 7 seconds to ambulate 6 m (20 ft). (7.88 seconds)  2.   CHANGE IN GAIT SPEED Instructions: Begin walking at your normal pace (for 1.5 m [5 ft]). When I tell you "go," walk as fast as you can (for 1.5 m [5 ft]). When I tell you "slow," walk as slowly as you can (for 1.5 m [5 ft]. (2) Mild impairment - Is able to change speed but demonstrates mild gait deviations, deviates 15.24 -25.4 cm (6 -10 in) outside of the 30.48-cm (12-in) walkway width, or no gait deviations but unable to achieve a significant change in velocity, or uses an assistive device  3.    GAIT WITH HORIZONTAL HEAD TURNS Instructions: Walk from here to the next mark 6 m (20 ft) away. Begin walking at your normal pace. Keep walking straight; after 3 steps, turn your head to the right and keep walking straight while looking to the right. After 3 more steps, turn your head to the left and keep walking straight while looking left. Continue alternating looking right and left. (2) Mild impairment - Performs head turns smoothly with slight change in gait velocity (eg, minor disruption to smooth gait path), deviates 15.24 -25.4 cm (6 -10 in) outside 30.48-cm (12-in) walkway width, or uses an assistive device.  4.   GAIT WITH VERTICAL HEAD TURNS Instructions: Walk from here to the next mark (6 m [20 ft]). Begin walking at your normal pace. Keep walking straight; after 3 steps, tip your head up and keep walking straight while looking up. After 3 more steps, tip  your head down, keep walking straight while looking down. Continue  alternating looking up and down every 3 steps until you have completed 2 repetitions in each direction. (2) Mild impairment - Performs task with slight change in gait velocity (eg, minor disruption to smooth gait path),  deviates 15.24 -25.4 cm (6 -10 in) outside 30.48-cm (12-in) walkway width or uses assistive device.  5.  GAIT AND PIVOT TURN Instructions: Begin with walking at your normal pace. When I tell you, "turn and stop," turn as quickly as you can to face the opposite direction and stop. (3) Normal - Pivot turns safely within 3 seconds and stops quickly with no loss of balance  6.   STEP OVER OBSTACLE Instructions: Begin walking at your normal speed. When you come to the shoe box, step over it, not around it, and keep walking. (2) Mild impairment - Is able to step over one shoe box (11.43 cm [4.5 in] total height) without changing gait speed; no evidence of imbalance.  7.   GAIT WITH NARROW BASE OF SUPPORT Instructions: Walk on the floor with arms folded across the chest, feet aligned heel to toe in tandem for a distance of 3.6 m [12 ft]. The number of steps taken in a straight line are counted for a maximum of 10 steps. (2) Mild impairment - Ambulates 7-9 steps  8.   GAIT WITH EYES CLOSED Instructions: Walk at your normal speed from here to the next mark (6 m [20 ft]) with your eyes closed. (1) Moderate impairment - Walks 6 m (20 ft), slow speed, abnormal gait pattern, evidence for imbalance, deviates 25.4 -38.1 cm (10 -15 in) outside 30.48-cm (12-in) walkway width. Requires more than 9 seconds to ambulate 6 m (20 ft). (10.19 seconds)  9.   AMBULATING BACKWARDS Instructions: Walk backwards until I tell you to stop (2) Mild impairment - Walks 6 m (20 ft), uses assistive device, slower speed, mild gait deviations, deviates 15.24 -25.4 cm (6 -10 in) outside 30.48-cm (12-in) walkway width (16.72 seconds)  10. STEPS Instructions:  Walk up these stairs as you would at home (ie, using the rail if necessary). At the top turn around and walk down. (2) Mild impairment-Alternating feet, must use rail.  Total 19/30   Interpretation of scores: Non-Specific Older Adults Cutoff Score: <=22/30 = risk of falls Parkinson's Disease Cutoff score <15/30= fall risk (Hoehn & Yahr 1-4)  Minimally Clinically Important Difference (MCID)  Stroke (acute, subacute, and chronic) = MDC: 4.2 points Vestibular (acute) = MDC: 6 points Community Dwelling Older Adults =  MCID: 4 points Parkinson's Disease  =  MDC: 4.3 points  (Academy of Neurologic Physical Therapy (nd). Functional Gait Assessment. Retrieved from https://www.neuropt.org/docs/default-source/cpgs/core-outcome-measures/function-gait-assessment-pocket-guide-proof9-(2).pdf?sfvrsn=b35f35043_0.)                                                                                                                       TREATMENT DATE: 12/31/23  Self Care: BP and HR taken in sitting at rest beginning of session (see below for details). Vitals:   12/31/23 1458  BP: (!) 142/69  Pulse: 73   Therapeutic Activity: Ambulation: x150 feet no AD use; tossing/catching ball for dual tasking (pt tripped 1x on mat on floor with L foot requiring min assist to correct balance) x20 feet  x2 sets walking forward with eyes closed (veers to R each time) x20 feet ambulating backwards tossing/catching ball for dual tasking (slower gait speed and increased concentration noted)  Therapeutic Exercise: Step ups: R LE (foot kept on step); x10 reps forward; x10 reps lateral L LE (foot kept on step); x10 reps forward; x10 reps lateral x10 sit to stands holding 4# medicine ball; from chair x10 sit to stands holding 6# medicine ball; from chair SciFit multi-peaks up to level 4 for 5 minutes using BUE/BLEs for strengthening, dynamic cardiovascular endurance and increased amplitude of stepping. RPE of 6-7/10 following  activity.  Average stride length 10.8 inches.   PATIENT EDUCATION: Education details: Continue HEP and working towards walking program. Person educated: Patient Education method: Programmer, Multimedia, Facilities Manager, and Verbal cues Education comprehension: verbalized understanding, returned demonstration, verbal cues required, and needs further education  HOME EXERCISE PROGRAM: Access Code: [email or web address] URL: [email or web address] Date: 11/25/2023 Prepared by: Damien Caulk  Exercises - Standing March with Counter Support  - 1 x daily - 5 x weekly - 1-2 sets - 10 reps - Heel Raises with Counter Support  - 1 x daily - 5 x weekly - 1-2 sets - 10 reps - Standing Hip Abduction with Counter Support  - 1 x daily - 5 x weekly - 1-2 sets - 10 reps - Backward Walking with Counter Support  - 1 x daily - 5 x weekly - 3 sets - 10 reps - Walking with Eyes Closed and Counter Support  - 1 x daily - 5 x weekly - 3 sets - 10 reps - Tandem Walking with Counter Support  - 1 x daily - 5 x weekly - 3 sets - 10 reps - Walking with Head Rotation  - 1 x daily - 5 x weekly - 3 sets - 10 reps  You Can Walk For A Certain Length Of Time Each Day                          Walk 5 minutes 1-2 times per day.             Increase 1-2  minutes every week             Work up to 20 minutes (1x per day).  GOALS: Goals reviewed with patient? Yes  SHORT TERM GOALS: Target date: 12/23/2023  Pt will be independent with initial HEP in order to improve strength and balance in order to decrease fall risk and improve function at home for ADL's. Baseline: Issued initially 11/25/23 Goal status: MET  2.  Assess FGA and update LTG as needed. Baseline: 15/30 (12/01/23) Goal status: MET  3.  Patient will tolerate 5 seconds of single leg stance without loss of balance B LE's to improve ability to get in and out of shower safely. Baseline: 2.78 seconds R LE and 3.43 seconds L LE on Eval; L LE 6.37 seconds and R LE 13.0  seconds 12/17/23 Goal status: MET  4. Patient will be compliant to formal walking program >/= 3 days per week to improve aerobic tolerance and ambulatory mechanics.  Baseline:  Goal status: PARTIALLY MET (pt using treadmill 3x/week to walk but not doing overground walking program)   LONG TERM GOALS: Target date: 01/06/2024; REVISED to 01/16/24 (d/t LTG's assessed 12/17/23)  Pt will be independent with final HEP in order to improve strength and balance in order to decrease fall risk and improve function at home for ADL's. Baseline: Issued  initially 11/25/23 Goal status: INITIAL  2.  Pt will decrease 5 Time Sit to Stand by at least 3 seconds or more in order to demonstrate clinically significant improvement in LE strength. Baseline: 17.28 seconds on Eval with B UE support; 17.94 seconds (pt intermittently using UE's and no UE's) on 12/17/23 Goal status: INITIAL  3.  Pt will increase by at least 0.13 m/s in order to demonstrate clinically significant improvement in community ambulation.  Baseline: 0.877 m/sec on Eval; 0.87 m/sec on 12/17/23 Goal status: INITIAL  4.  Patient will increase Functional Gait Assessment score to >22/30 as to reduce fall risk and improve dynamic gait safety with community ambulation. Baseline: 15/30 (12/01/23); 19/30 on 12/17/23 Goal status: INITIAL  5.  Pt will demonstrate improved B LE foot clearance for safety with gait. Baseline: decreased B LE step length/foot clearance on Eval; pt reporting able to pick them up a little better on 12/17/23 Goal status: INITIAL  ASSESSMENT:  CLINICAL IMPRESSION: Patient was seen today for physical therapy treatment to address strength and gait.  Focused session on LE strengthening and balance with ambulation (dual tasking also utilized).  Patient continues to be limited by strength and balance.  They demonstrate improvement in overall activity tolerance today.  They would continue to benefit from skilled PT to address  impairments as noted and progress towards long term goals.  OBJECTIVE IMPAIRMENTS: Abnormal gait, cardiopulmonary status limiting activity, decreased activity tolerance, decreased balance, decreased coordination, decreased endurance, decreased knowledge of condition, decreased knowledge of use of DME, decreased mobility, difficulty walking, decreased strength, and postural dysfunction.   ACTIVITY LIMITATIONS: carrying, lifting, standing, squatting, stairs, transfers, toileting, dressing, and locomotion level  PARTICIPATION LIMITATIONS: cleaning, laundry, driving, shopping, and community activity  PERSONAL FACTORS: Age, Fitness, Past/current experiences, and 1-2 comorbidities: CAD s/p CABG, htn, PSVT are also affecting patient's functional outcome.   REHAB POTENTIAL: Good  CLINICAL DECISION MAKING: Evolving/moderate complexity  EVALUATION COMPLEXITY: Moderate  PLAN:  PT FREQUENCY: 2x/week  PT DURATION: 6 weeks  PLANNED INTERVENTIONS: [id number]- PT Re-evaluation, 97750- Physical Performance Testing, 97110-Therapeutic exercises, 97530- Therapeutic activity, [id number]- Neuromuscular re-education, 97535- Self Care, [id number]- Manual therapy, [id number]- Gait training, [phone]- Orthotic Initial, [phone]- Orthotic/Prosthetic subsequent, [phone]- Aquatic Therapy, [phone]- Electrical stimulation (manual), [phone]- Ultrasound, Patient/Family education, Balance training, Stair training, Taping, Joint mobilization, DME instructions, Cryotherapy, and Moist heat  PLAN FOR NEXT SESSION: Check on HEP/walking program (and progress?); balance; increase B LE foot clearance; obstacle clearance, backwards gait, visually limited conditions, compliant surfaces, blaze pods, terminal hip flexor engagement.  Stride length.   Damien Caulk, PT 12/31/2023, 8:01 PM

## 2024-01-04 ENCOUNTER — Telehealth: Payer: Self-pay

## 2024-01-04 NOTE — Telephone Encounter (Signed)
 If he isn't passing any stool at all he may need to take a suppository.

## 2024-01-04 NOTE — Telephone Encounter (Signed)
 Copied from CRM [id number]. Topic: Clinical - Medical Advice >> Jan 04, 2024  2:55 PM Dedra B wrote: Reason for CRM: Pt called to let Dr. Frann know that he is taking metamucil and Dulcolax when needed but still having issues with constipation.

## 2024-01-05 ENCOUNTER — Ambulatory Visit: Admitting: Physical Therapy

## 2024-01-05 ENCOUNTER — Encounter: Payer: Self-pay | Admitting: Physical Therapy

## 2024-01-05 VITALS — BP 140/86 | HR 72

## 2024-01-05 DIAGNOSIS — R2689 Other abnormalities of gait and mobility: Secondary | ICD-10-CM | POA: Diagnosis not present

## 2024-01-05 DIAGNOSIS — I639 Cerebral infarction, unspecified: Secondary | ICD-10-CM

## 2024-01-05 DIAGNOSIS — Z9181 History of falling: Secondary | ICD-10-CM

## 2024-01-05 NOTE — Telephone Encounter (Signed)
 Called pt was to try suppository. Pt stated understand.

## 2024-01-05 NOTE — Therapy (Signed)
 OUTPATIENT PHYSICAL THERAPY NEURO TREATMENT   Physical Therapy 10th visit Progress Note   Dates of Reporting Period: 11/25/23 - 01/05/24  See Note below for Objective Data and Assessment of Progress/Goals.  Thank you for the referral of this patient. Damien Caulk, PT   Patient Name: Robert Pacheco MRN: [id number] DOB:1933-10-06, 89 y.o., male Today's Date: 01/05/2024   PCP: [name], DO REFERRING PROVIDER: Raenelle Donalda HERO, MD  END OF SESSION:  PT End of Session - 01/05/24 1535     Visit Number 10    Number of Visits 13   12 plus Eval   Date for Recertification  01/22/24   for scheduling delays   Authorization Type [facility] Medicare    Authorization Time Period 11/25/23 - 01/20/24 for 6 visits; 12/22/23 - 02/09/23 for 7 additional visits    Authorization - Visit Number 10    Authorization - Number of Visits 13   6 plus 7   Progress Note Due on Visit 10    PT Start Time 1534    PT Stop Time 1621    PT Time Calculation (min) 47 min    Equipment Utilized During Treatment Gait belt    Activity Tolerance Patient tolerated treatment well    Behavior During Therapy [facility] for tasks assessed/performed           Past Medical History:  Diagnosis Date   Allergy    Coronary artery disease    Hyperlipidemia    Hypertension    Personal history of colonic adenomas 05/10/2012   Skin cancer, basal cell    Past Surgical History:  Procedure Laterality Date   CATARACT EXTRACTION     COLONOSCOPY  multiple   CORONARY ARTERY BYPASS GRAFT  2000   EYE SURGERY     HERNIA REPAIR     MOHS SURGERY     Patient Active Problem List   Diagnosis Date Noted   Transaminitis 11/06/2023   History of TIA (transient ischemic attack) 11/05/2023   Right sided weakness 11/05/2023   Facial droop 11/05/2023   History of atrial tachycardia 11/05/2023   History of CAD (coronary artery disease) 11/05/2023   Acute CVA (cerebrovascular accident) (HCC) 11/05/2023   Elevated liver enzymes  11/05/2023   PSVT (paroxysmal supraventricular tachycardia) 03/27/2021   Senile purpura 09/26/2020   Bilateral leg edema 07/11/2020   Dizziness 07/11/2020   Coronary artery disease involving native coronary artery of native heart without angina pectoris 07/11/2020   Mixed hyperlipidemia 07/11/2020   Allergy 07/10/2020   Lightheadedness 04/23/2020   Lumbar radiculopathy 08/08/2019   Greater trochanteric pain syndrome of right lower extremity 04/18/2019   Constipation 04/18/2019   Bilateral lower extremity edema 10/15/2018   Displacement of intraocular lens 06/16/2017   Combined forms of age-related cataract of left eye 03/09/2014   Regular astigmatism 03/09/2014   Family history of colon cancer 10/01/2012   History of colonic polyps 05/10/2012   Personal history of colonic adenomas 05/10/2012   BPH (benign prostatic hyperplasia) 03/27/2009   DYSPLASTIC NEVUS, FACE 10/05/2008   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  No acute changes.  No pain.  Still having lightheadedness in morning but goes away; no current lightheadedness.  Doing HEP last 2 days--no issues.  Not doing much walking d/t cold weather but is getting on treadmill 2-3x's per week at various low pace. Pt accompanied by: self and family member  (daughter stayed in waiting room)  PERTINENT HISTORY: Hospitalization 11/04/23 to 11/07/23; pt with R facial droop, dysphagia, and R sided weakness; found to have acute L basal ganglia CVA.  ED visit 11/19/24 d/t sudden onset numbness B UE's/LE's (sx's then resolved; no motor symptoms); imaging showing significant DDD with spinal cord mass effect--recommended f/u OP with Neurosurgery; no collar necessary per MD [name].  Per follow-up Neurology note from DO [facility] 11/24/23 pt continues to have spells of numbness involving the arms and leg which occurs a few times per week. Symptoms are not constant. He denies arm/leg weakness or imbalance. He has some neck discomfort, which is worse at night time. He also mentions mild numbness around the lips on both side of the mouth. No problems with speech or swallow.   PMH includes CAD s/p CABG, htn, PSVT, HLD.  PAIN:  Are you having pain? No  PRECAUTIONS: Fall; C-spine imaging showing significant DDD with spinal cord mass effect  RED FLAGS: Cervical red flags: Dysphagia No, Dysmetria No, Diplopia No, Nystagmus No, and Nausea No   WEIGHT BEARING RESTRICTIONS: No  FALLS: Has patient fallen in last 6 months? Yes. Number of falls 1 (fell between bed and end table; skin tear to L UE)  LIVING ENVIRONMENT: Lives with: lives alone Lives in: House/apartment Stairs: Yes: External: 2 steps; none Has following equipment at home: Single point cane, Walker - 2 wheeled, shower chair, and Grab bars.  PLOF: Independent.  Hasn't drove lately (waiting until has more energy back).  Mostly uses walker in bedroom.  PATIENT GOALS: Be able to move a little better.  Improve leg strength.  OBJECTIVE:  Note: Objective measures were completed at Evaluation unless otherwise noted.  DIAGNOSTIC FINDINGS:  MRI Brain 11/05/23: Small acute perforator infarct in the left basal ganglia.   MRI c-spine 11/20/23: 1. Multifactorial degenerative cervical spinal stenosis WITH spinal cord  mass effect from C2-C3 through C6-C7. No spinal cord edema or myelomalacia identified. 2. 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 3. Degenerative severe right C4 and C7 nerve level foraminal stenosis, moderate at the left C5, right C6, left C7 levels.  COGNITION: Overall cognitive status: Within functional limits for tasks assessed   SENSATION: Intact B LE light touch (pt with self reported decreased sensation below knees to feet B)  COORDINATION: Mild decreased B rapid alternating toe tapping Edema: L LE more edematous than R LE   MUSCLE TONE: Intact B LE's  Edema: 28.8 cm L LE malleolar line    30 cm R LE malleolar line    Chronic LE edema per pt report  POSTURE: rounded shoulders, forward head, and posterior pelvic tilt  LOWER EXTREMITY ROM:     Active  Right Eval Left Eval  Hip flexion WNL WNL  Hip extension    Hip abduction    Hip adduction    Hip internal rotation    Hip external rotation    Knee flexion WNL WNL  Knee extension WNL WNL  Ankle dorsiflexion WNL WNL  Ankle plantarflexion WNL WNL  Ankle inversion    Ankle eversion     (  Blank rows = not tested)  LOWER EXTREMITY MMT:    MMT Right Eval Left Eval  Hip flexion 5/5 4+/5  Hip extension    Hip abduction    Hip adduction    Hip internal rotation    Hip external rotation    Knee flexion 5/5 4+/5  Knee extension 5/5 4+/5  Ankle dorsiflexion At least 3/5 AROM At least 3/5 AROM  Ankle plantarflexion    Ankle inversion    Ankle eversion    (Blank rows = not tested)  TRANSFERS: Use of B UE's Sit to stand: Modified independence  Assistive device utilized: None     Stand to sit: Modified independence  Assistive device utilized: None      GAIT: Findings: Mild flexed posture; decreased B LE step length/foot clearance;  decreased B LE heel-strike (more foot flat); decreased R UE (compared to L UE) arm swing; clinic distances;  SBA  FUNCTIONAL TESTS:  5 times sit to stand: 17.28 seconds; B UE support on Eval.  17.94 seconds (pt intermittently using UE's and no UE's) on 12/17/23 10 meter walk test: 0.877 m/sec (11.40 seconds; no AD use) on Eval.. 0.87 m/sec (11.41 seconds) 12/17/23 SLS: 2.78 seconds R LE and 3.43 seconds L LE on Eval.  L LE 6.37 seconds and R LE 13.0 seconds on 12/17/23 FGA: 15/30 (12/01/23); 19/30 on 12/17/23  FUNCTIONAL GAIT ASSESSMENT  Date: 12/17/23 Score  GAIT LEVEL SURFACE Instructions: Walk at your normal speed from here to the next mark (6 m) [20 ft]. (1) Moderate impairment-Walks 6 m (20 ft), slow speed, abnormal gait pattern, evidence for imbalance, or deviates 25.4 - 38.1 cm (10 -15 in) outside of the 30.48-cm (12-in) walkway width. Requires more than 7 seconds to ambulate 6 m (20 ft). (7.88 seconds)  2.   CHANGE IN GAIT SPEED Instructions: Begin walking at your normal pace (for 1.5 m [5 ft]). When I tell you "go," walk as fast as you can (for 1.5 m [5 ft]). When I tell you "slow," walk as slowly as you can (for 1.5 m [5 ft]. (2) Mild impairment - Is able to change speed but demonstrates mild gait deviations, deviates 15.24 -25.4 cm (6 -10 in) outside of the 30.48-cm (12-in) walkway width, or no gait deviations but unable to achieve a significant change in velocity, or uses an assistive device  3.    GAIT WITH HORIZONTAL HEAD TURNS Instructions: Walk from here to the next mark 6 m (20 ft) away. Begin walking at your normal pace. Keep walking straight; after 3 steps, turn your head to the right and keep walking straight while looking to the right. After 3 more steps, turn your head to the left and keep walking straight while looking left. Continue alternating looking right and left. (2) Mild impairment - Performs head turns smoothly with slight change in gait velocity (eg, minor disruption to smooth gait path), deviates 15.24 -25.4 cm (6 -10 in) outside 30.48-cm (12-in) walkway width, or uses an  assistive device.  4.   GAIT WITH VERTICAL HEAD TURNS Instructions: Walk from here to the next mark (6 m [20 ft]). Begin walking at your normal pace. Keep walking straight; after 3 steps, tip your head up and keep walking straight while looking up. After 3 more steps, tip your head down, keep walking straight while looking down. Continue  alternating looking up and down every 3 steps until you have completed 2 repetitions in each direction. (2) Mild impairment - Performs task with  slight change in gait velocity (eg, minor disruption to smooth gait path), deviates 15.24 -25.4 cm (6 -10 in) outside 30.48-cm (12-in) walkway width or uses assistive device.  5.  GAIT AND PIVOT TURN Instructions: Begin with walking at your normal pace. When I tell you, "turn and stop," turn as quickly as you can to face the opposite direction and stop. (3) Normal - Pivot turns safely within 3 seconds and stops quickly with no loss of balance  6.   STEP OVER OBSTACLE Instructions: Begin walking at your normal speed. When you come to the shoe box, step over it, not around it, and keep walking. (2) Mild impairment - Is able to step over one shoe box (11.43 cm [4.5 in] total height) without changing gait speed; no evidence of imbalance.  7.   GAIT WITH NARROW BASE OF SUPPORT Instructions: Walk on the floor with arms folded across the chest, feet aligned heel to toe in tandem for a distance of 3.6 m [12 ft]. The number of steps taken in a straight line are counted for a maximum of 10 steps. (2) Mild impairment - Ambulates 7-9 steps  8.   GAIT WITH EYES CLOSED Instructions: Walk at your normal speed from here to the next mark (6 m [20 ft]) with your eyes closed. (1) Moderate impairment - Walks 6 m (20 ft), slow speed, abnormal gait pattern, evidence for imbalance, deviates 25.4 -38.1 cm (10 -15 in) outside 30.48-cm (12-in) walkway width. Requires more than 9 seconds to ambulate 6 m (20 ft). (10.19 seconds)  9.   AMBULATING  BACKWARDS Instructions: Walk backwards until I tell you to stop (2) Mild impairment - Walks 6 m (20 ft), uses assistive device, slower speed, mild gait deviations, deviates 15.24 -25.4 cm (6 -10 in) outside 30.48-cm (12-in) walkway width (16.72 seconds)  10. STEPS Instructions: Walk up these stairs as you would at home (ie, using the rail if necessary). At the top turn around and walk down. (2) Mild impairment-Alternating feet, must use rail.  Total 19/30   Interpretation of scores: Non-Specific Older Adults Cutoff Score: <=22/30 = risk of falls Parkinson's Disease Cutoff score <15/30= fall risk (Hoehn & Yahr 1-4)  Minimally Clinically Important Difference (MCID)  Stroke (acute, subacute, and chronic) = MDC: 4.2 points Vestibular (acute) = MDC: 6 points Community Dwelling Older Adults =  MCID: 4 points Parkinson's Disease  =  MDC: 4.3 points  (Academy of Neu[employer]ic Physical Therapy (nd). Functional Gait Assessment. Retrieved from https://www.neuropt.org/docs/default-source/cpgs/core-outcome-measures/function-gait-assessment-pocket-guide-proof9-(2).pdf?sfvrsn=b14f35043_0.)                                                                                                                       TREATMENT DATE: 01/05/24  Self Care: BP and HR taken in sitting at rest beginning of session (see below for details).  Manual BP taken. Vitals:   01/05/24 1540  BP: (!) 140/86  Pulse: 72   Therapeutic Activity: Balance on 9 foot foam balance beam: Sidestepping L/R (L then R =1  rep): x4 reps (intermittent UE support for balance; CGA for safety) Heel to toe tandem walking (1 rep=1 length of balance beam): x4 reps walking forward (intermittent UE support for balance) Standing on Aire[employer] in // bars: Toe taps activity (forward to 6 inch cone to lateral to gum drop and back forward to 6 inch cone = 1 rep) x10 reps then x5 reps B LE's Notes: occasionally UE support for balance (especially with fatigue last  few reps); pt pausing on lateral gum drop for balance typically  Therapeutic Exercise SciFit multi-peaks up to level 4 for 8 minutes using BUE/BLEs for neural priming for LE strengthening, dynamic cardiovascular activity and increased amplitude of stepping. RPE of 7/10 with HR 76 bpm following activity.  Average stride length 11.2 inches.  PATIENT EDUCATION: Education details: Continue HEP and working towards walking program. Person educated: Patient Education method: Programmer, Multimedia, Facilities Manager, and Verbal cues Education comprehension: verbalized understanding, returned demonstration, verbal cues required, and needs further education  HOME EXERCISE PROGRAM: Access Code: [email or web address] URL: [email or web address] Date: 11/25/2023 Prepared by: Damien Caulk  Exercises - Standing March with Counter Support  - 1 x daily - 5 x weekly - 1-2 sets - 10 reps - Heel Raises with Counter Support  - 1 x daily - 5 x weekly - 1-2 sets - 10 reps - Standing Hip Abduction with Counter Support  - 1 x daily - 5 x weekly - 1-2 sets - 10 reps - Backward Walking with Counter Support  - 1 x daily - 5 x weekly - 3 sets - 10 reps - Walking with Eyes Closed and Counter Support  - 1 x daily - 5 x weekly - 3 sets - 10 reps - Tandem Walking with Counter Support  - 1 x daily - 5 x weekly - 3 sets - 10 reps - Walking with Head Rotation  - 1 x daily - 5 x weekly - 3 sets - 10 reps  You Can Walk For A Certain Length Of Time Each Day                          Walk 5 minutes 1-2 times per day.             Increase 1-2  minutes every week             Work up to 20 minutes (1x per day).  GOALS: Goals reviewed with patient? Yes  SHORT TERM GOALS: Target date: 12/23/2023  Pt will be independent with initial HEP in order to improve strength and balance in order to decrease fall risk and improve function at home for ADL's. Baseline: Issued initially 11/25/23 Goal status: MET  2.  Assess FGA and update LTG as  needed. Baseline: 15/30 (12/01/23) Goal status: MET  3.  Patient will tolerate 5 seconds of single leg stance without loss of balance B LE's to improve ability to get in and out of shower safely. Baseline: 2.78 seconds R LE and 3.43 seconds L LE on Eval; L LE 6.37 seconds and R LE 13.0 seconds 12/17/23 Goal status: MET  4. Patient will be compliant to formal walking program >/= 3 days per week to improve aerobic tolerance and ambulatory mechanics.  Baseline:  Goal status: PARTIALLY MET (pt using treadmill 3x/week to walk but not doing overground walking program)   LONG TERM GOALS: Target date: 01/06/2024; REVISED to 01/16/24 (d/t LTG's assessed 12/17/23)  Pt will be independent with final HEP in  order to improve strength and balance in order to decrease fall risk and improve function at home for ADL's. Baseline: Issued initially 11/25/23 Goal status: INITIAL  2.  Pt will decrease 5 Time Sit to Stand by at least 3 seconds or more in order to demonstrate clinically significant improvement in LE strength. Baseline: 17.28 seconds on Eval with B UE support; 17.94 seconds (pt intermittently using UE's and no UE's) on 12/17/23 Goal status: INITIAL  3.  Pt will increase by at least 0.13 m/s in order to demonstrate clinically significant improvement in community ambulation.  Baseline: 0.877 m/sec on Eval; 0.87 m/sec on 12/17/23 Goal status: INITIAL  4.  Patient will increase Functional Gait Assessment score to >22/30 as to reduce fall risk and improve dynamic gait safety with community ambulation. Baseline: 15/30 (12/01/23); 19/30 on 12/17/23 Goal status: INITIAL  5.  Pt will demonstrate improved B LE foot clearance for safety with gait. Baseline: decreased B LE step length/foot clearance on Eval; pt reporting able to pick them up a little better on 12/17/23 Goal status: INITIAL  ASSESSMENT:  CLINICAL IMPRESSION: Patient was seen today for physical therapy treatment to address  balance and strength.  Focused session on dynamic balance on compliant surface and UE/LE strengthening/endurance.  Patient continues to be limited by strength and balance.  They demonstrate improvement in overall strength and also balance reactions/correction with balance activity repetition.  They would continue to benefit from skilled PT to address impairments as noted and progress towards long term goals.  OBJECTIVE IMPAIRMENTS: Abnormal gait, cardiopulmonary status limiting activity, decreased activity tolerance, decreased balance, decreased coordination, decreased endurance, decreased knowledge of condition, decreased knowledge of use of DME, decreased mobility, difficulty walking, decreased strength, and postural dysfunction.   ACTIVITY LIMITATIONS: carrying, lifting, standing, squatting, stairs, transfers, toileting, dressing, and locomotion level  PARTICIPATION LIMITATIONS: cleaning, laundry, driving, shopping, and community activity  PERSONAL FACTORS: Age, Fitness, Past/current experiences, and 1-2 comorbidities: CAD s/p CABG, htn, PSVT are also affecting patient's functional outcome.   REHAB POTENTIAL: Good  CLINICAL DECISION MAKING: Evolving/moderate complexity  EVALUATION COMPLEXITY: Moderate  PLAN:  PT FREQUENCY: 2x/week  PT DURATION: 6 weeks  PLANNED INTERVENTIONS: [id number]- PT Re-evaluation, 97750- Physical Performance Testing, 97110-Therapeutic exercises, 97530- Therapeutic activity, [id number]- Neuromuscular re-education, 97535- Self Care, [id number]- Manual therapy, [id number]- Gait training, [phone]- Orthotic Initial, [phone]- Orthotic/Prosthetic subsequent, [phone]- Aquatic Therapy, [phone]- Electrical stimulation (manual), [phone]- Ultrasound, Patient/Family education, Balance training, Stair training, Taping, Joint mobilization, DME instructions, Cryotherapy, and Moist heat  PLAN FOR NEXT SESSION: Check on HEP/walking program (and progress?); balance; increase B LE foot clearance; obstacle  clearance, backwards gait, visually limited conditions, compliant surfaces, blaze pods, terminal hip flexor engagement.  Stride length.   Damien Caulk, PT 01/05/2024, 7:15 PM

## 2024-01-07 ENCOUNTER — Encounter: Payer: Self-pay | Admitting: Physical Therapy

## 2024-01-07 ENCOUNTER — Ambulatory Visit: Admitting: Physical Therapy

## 2024-01-07 VITALS — BP 154/67 | HR 80

## 2024-01-07 DIAGNOSIS — I639 Cerebral infarction, unspecified: Secondary | ICD-10-CM

## 2024-01-07 DIAGNOSIS — R2689 Other abnormalities of gait and mobility: Secondary | ICD-10-CM | POA: Diagnosis not present

## 2024-01-07 DIAGNOSIS — Z9181 History of falling: Secondary | ICD-10-CM

## 2024-01-07 NOTE — Therapy (Signed)
 OUTPATIENT PHYSICAL THERAPY NEURO TREATMENT   Patient Name: Robert Pacheco MRN: [id number] DOB:November 11, 1933, 89 y.o., male Today's Date: 01/07/2024   PCP: [name], DO REFERRING PROVIDER: Raenelle Donalda HERO, MD  END OF SESSION:  PT End of Session - 01/07/24 1455     Visit Number 11    Number of Visits 13   12 plus Eval   Date for Recertification  01/22/24   for scheduling delays   Authorization Type [facility] Medicare    Authorization Time Period 11/25/23 - 01/20/24 for 6 visits; 12/22/23 - 02/09/23 for 7 additional visits    Authorization - Visit Number 11    Authorization - Number of Visits 13   6 plus 7   Progress Note Due on Visit 10    PT Start Time 1454   pt arrived late   PT Stop Time 1532    PT Time Calculation (min) 38 min    Equipment Utilized During Treatment Gait belt    Activity Tolerance Patient tolerated treatment well    Behavior During Therapy [facility] for tasks assessed/performed           Past Medical History:  Diagnosis Date   Allergy    Coronary artery disease    Hyperlipidemia    Hypertension    Personal history of colonic adenomas 05/10/2012   Skin cancer, basal cell    Past Surgical History:  Procedure Laterality Date   CATARACT EXTRACTION     COLONOSCOPY  multiple   CORONARY ARTERY BYPASS GRAFT  2000   EYE SURGERY     HERNIA REPAIR     MOHS SURGERY     Patient Active Problem List   Diagnosis Date Noted   Transaminitis 11/06/2023   History of TIA (transient ischemic attack) 11/05/2023   Right sided weakness 11/05/2023   Facial droop 11/05/2023   History of atrial tachycardia 11/05/2023   History of CAD (coronary artery disease) 11/05/2023   Acute CVA (cerebrovascular accident) (HCC) 11/05/2023   Elevated liver enzymes 11/05/2023   PSVT (paroxysmal supraventricular tachycardia) 03/27/2021   Senile purpura 09/26/2020   Bilateral leg edema 07/11/2020   Dizziness 07/11/2020   Coronary artery disease involving native coronary artery  of native heart without angina pectoris 07/11/2020   Mixed hyperlipidemia 07/11/2020   Allergy 07/10/2020   Lightheadedness 04/23/2020   Lumbar radiculopathy 08/08/2019   Greater trochanteric pain syndrome of right lower extremity 04/18/2019   Constipation 04/18/2019   Bilateral lower extremity edema 10/15/2018   Displacement of intraocular lens 06/16/2017   Combined forms of age-related cataract of left eye 03/09/2014   Regular astigmatism 03/09/2014   Family history of colon cancer 10/01/2012   History of colonic polyps 05/10/2012   Personal history of colonic adenomas 05/10/2012   BPH (benign prostatic hyperplasia) 03/27/2009   DYSPLASTIC NEVUS, FACE 10/05/2008   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  No pain.  No acute changes reported.  Doing HEP; no concerns/questions.  Pt reports running late d/t taking a nap. Pt accompanied by: self and family member (daughter stayed in waiting room)  PERTINENT HISTORY: Hospitalization 11/04/23 to 11/07/23; pt with R facial droop, dysphagia, and R sided weakness; found to have acute L basal ganglia CVA.  ED visit 11/19/24 d/t sudden onset numbness B UE's/LE's (sx's then resolved; no motor symptoms); imaging showing significant DDD with spinal cord mass  effect--recommended f/u OP with Neurosurgery; no collar necessary per MD [name].  Per follow-up Neurology note from DO [facility] 11/24/23 pt continues to have spells of numbness involving the arms and leg which occurs a few times per week. Symptoms are not constant. He denies arm/leg weakness or imbalance. He has some neck discomfort, which is worse at night time. He also mentions mild numbness around the lips on both side of the mouth. No problems with speech or swallow.   PMH includes CAD s/p CABG, htn, PSVT, HLD.  PAIN:  Are you having pain? No  PRECAUTIONS: Fall; C-spine imaging showing significant DDD with spinal cord mass effect  RED FLAGS: Cervical red flags: Dysphagia No, Dysmetria No, Diplopia No, Nystagmus No, and Nausea No   WEIGHT BEARING RESTRICTIONS: No  FALLS: Has patient fallen in last 6 months? Yes. Number of falls 1 (fell between bed and end table; skin tear to L UE)  LIVING ENVIRONMENT: Lives with: lives alone Lives in: House/apartment Stairs: Yes: External: 2 steps; none Has following equipment at home: Single point cane, Walker - 2 wheeled, shower chair, and Grab bars.  PLOF: Independent.  Hasn't drove lately (waiting until has more energy back).  Mostly uses walker in bedroom.  PATIENT GOALS: Be able to move a little better.  Improve leg strength.  OBJECTIVE:  Note: Objective measures were completed at Evaluation unless otherwise noted.  DIAGNOSTIC FINDINGS:  MRI Brain 11/05/23: Small acute perforator infarct in the left basal ganglia.   MRI c-spine 11/20/23: 1. Multifactorial degenerative cervical spinal stenosis WITH spinal cord mass effect from C2-C3 through C6-C7. No spinal cord edema or myelomalacia identified. 2. 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 3. Degenerative severe right C4 and C7 nerve level foraminal stenosis, moderate at the left C5, right C6, left C7  levels.  COGNITION: Overall cognitive status: Within functional limits for tasks assessed   SENSATION: Intact B LE light touch (pt with self reported decreased sensation below knees to feet B)  COORDINATION: Mild decreased B rapid alternating toe tapping Edema: L LE more edematous than R LE   MUSCLE TONE: Intact B LE's  Edema: 28.8 cm L LE malleolar line    30 cm R LE malleolar line    Chronic LE edema per pt report  POSTURE: rounded shoulders, forward head, and posterior pelvic tilt  LOWER EXTREMITY ROM:     Active  Right Eval Left Eval  Hip flexion WNL WNL  Hip extension    Hip abduction    Hip adduction    Hip internal rotation    Hip external rotation    Knee flexion WNL WNL  Knee extension WNL WNL  Ankle dorsiflexion WNL WNL  Ankle plantarflexion WNL WNL  Ankle inversion    Ankle eversion     (Blank rows = not tested)  LOWER EXTREMITY MMT:    MMT Right Eval Left Eval  Hip flexion 5/5 4+/5  Hip extension    Hip abduction    Hip adduction    Hip internal rotation    Hip external rotation    Knee flexion 5/5 4+/5  Knee extension 5/5 4+/5  Ankle dorsiflexion At least 3/5 AROM At least 3/5 AROM  Ankle plantarflexion    Ankle inversion    Ankle eversion    (Blank rows = not tested)  TRANSFERS: Use of B UE's Sit to stand: Modified independence  Assistive device utilized: None     Stand to sit: Modified independence  Assistive device utilized: None      GAIT: Findings: Mild flexed posture; decreased B LE step length/foot clearance;  decreased B LE heel-strike (more foot flat); decreased R UE (compared to L UE) arm swing; clinic distances; SBA  FUNCTIONAL TESTS:  5 times sit to stand: 17.28 seconds; B UE support on Eval.  17.94 seconds (pt intermittently using UE's and no UE's) on 12/17/23 10 meter walk test: 0.877 m/sec (11.40 seconds; no AD use) on Eval.. 0.87 m/sec (11.41 seconds) 12/17/23 SLS: 2.78 seconds R LE and 3.43 seconds L LE on Eval.  L LE  6.37 seconds and R LE 13.0 seconds on 12/17/23 FGA: 15/30 (12/01/23); 19/30 on 12/17/23  FUNCTIONAL GAIT ASSESSMENT  Date: 12/17/23 Score  GAIT LEVEL SURFACE Instructions: Walk at your normal speed from here to the next mark (6 m) [20 ft]. (1) Moderate impairment-Walks 6 m (20 ft), slow speed, abnormal gait pattern, evidence for imbalance, or deviates 25.4 - 38.1 cm (10 -15 in) outside of the 30.48-cm (12-in) walkway width. Requires more than 7 seconds to ambulate 6 m (20 ft). (7.88 seconds)  2.   CHANGE IN GAIT SPEED Instructions: Begin walking at your normal pace (for 1.5 m [5 ft]). When I tell you go, walk as fast as you can (for 1.5 m [5 ft]). When I tell you slow, walk as slowly as you can (for 1.5 m [5 ft]. (2) Mild impairment - Is able to change speed but demonstrates mild gait deviations, deviates 15.24 -25.4 cm (6 -10 in) outside of the 30.48-cm (12-in) walkway width, or no gait deviations but unable to achieve a significant change in velocity, or uses an assistive device  3.    GAIT WITH HORIZONTAL HEAD TURNS Instructions: Walk from here to the next mark 6 m (20 ft) away. Begin walking at your normal pace. Keep walking straight; after 3 steps, turn your head to the right and keep walking straight while looking to the right. After 3 more steps, turn your head to the left and keep walking straight while looking left. Continue alternating looking right and left. (2) Mild impairment - Performs head turns smoothly with slight change in gait velocity (eg, minor disruption to smooth gait path), deviates 15.24 -25.4 cm (6 -10 in) outside 30.48-cm (12-in) walkway width, or uses an assistive device.  4.   GAIT WITH VERTICAL HEAD TURNS Instructions: Walk from here to the next mark (6 m [20 ft]). Begin walking at your normal pace. Keep walking straight; after 3 steps, tip your head up and keep walking straight while looking up. After 3 more steps, tip your head down, keep walking straight while looking  down. Continue  alternating looking up and down every 3 steps until you have completed 2 repetitions in each direction. (2) Mild impairment - Performs task with slight change in gait velocity (eg, minor disruption to smooth gait path), deviates 15.24 -25.4 cm (6 -10 in) outside 30.48-cm (12-in) walkway  width or uses assistive device.  5.  GAIT AND PIVOT TURN Instructions: Begin with walking at your normal pace. When I tell you, turn and stop, turn as quickly as you can to face the opposite direction and stop. (3) Normal - Pivot turns safely within 3 seconds and stops quickly with no loss of balance  6.   STEP OVER OBSTACLE Instructions: Begin walking at your normal speed. When you come to the shoe box, step over it, not around it, and keep walking. (2) Mild impairment - Is able to step over one shoe box (11.43 cm [4.5 in] total height) without changing gait speed; no evidence of imbalance.  7.   GAIT WITH NARROW BASE OF SUPPORT Instructions: Walk on the floor with arms folded across the chest, feet aligned heel to toe in tandem for a distance of 3.6 m [12 ft]. The number of steps taken in a straight line are counted for a maximum of 10 steps. (2) Mild impairment - Ambulates 7-9 steps  8.   GAIT WITH EYES CLOSED Instructions: Walk at your normal speed from here to the next mark (6 m [20 ft]) with your eyes closed. (1) Moderate impairment - Walks 6 m (20 ft), slow speed, abnormal gait pattern, evidence for imbalance, deviates 25.4 -38.1 cm (10 -15 in) outside 30.48-cm (12-in) walkway width. Requires more than 9 seconds to ambulate 6 m (20 ft). (10.19 seconds)  9.   AMBULATING BACKWARDS Instructions: Walk backwards until I tell you to stop (2) Mild impairment - Walks 6 m (20 ft), uses assistive device, slower speed, mild gait deviations, deviates 15.24 -25.4 cm (6 -10 in) outside 30.48-cm (12-in) walkway width (16.72 seconds)  10. STEPS Instructions: Walk up these stairs as you would at home (ie, using  the rail if necessary). At the top turn around and walk down. (2) Mild impairment-Alternating feet, must use rail.  Total 19/30   Interpretation of scores: Non-Specific Older Adults Cutoff Score: <=22/30 = risk of falls Parkinsons Disease Cutoff score <15/30= fall risk (Hoehn & Yahr 1-4)  Minimally Clinically Important Difference (MCID)  Stroke (acute, subacute, and chronic) = MDC: 4.2 points Vestibular (acute) = MDC: 6 points Community Dwelling Older Adults =  MCID: 4 points Parkinsons Disease  =  MDC: 4.3 points  (Academy of Neu[employer]ic Physical Therapy (nd). Functional Gait Assessment. Retrieved from https://www.neuropt.org/docs/default-source/cpgs/core-outcome-measures/function-gait-assessment-pocket-guide-proof9-(2).pdf?sfvrsn=b38f35043_0.)                                                                                                                       TREATMENT DATE: 01/07/24  Self Care: BP and HR taken in sitting at rest beginning of session (see below for details). Vitals:   01/07/24 1457  BP: (!) 154/67  Pulse: 80   Therapeutic Exercise: SciFit multi-peaks up to level 4 for 8 minutes using BUE/BLEs for global strengthening, dynamic cardiovascular warmup and increased amplitude of stepping. RPE of 7/10 with HR 92 bpm following activity.  Average stride length 11.2 inches.  End range  hip flexion strengthening (standing at bottom of steps with one foot starting on 1st 6 inch step; B UE support): x10 reps B LE's Notes: vc's not to arch back last few reps d/t hip flexor fatigue; CGA  Therapeutic Activity: Ambulation with 3# ankle weights B LE's: x230 feet x2 trials CGA Notes: occasional vc's to pick up feet/increase foot clearance; sitting rest break between trials Ambulation with black [place] resistance band around pelvis with random perturbations/resistance: x230 feet (CGA; pt able to self correct balance when challenged)   PATIENT EDUCATION: Education details:  Continue HEP and working towards walking program.  Remaining visits left in POC. Person educated: Patient Education method: Explanation, Demonstration, and Verbal cues Education comprehension: verbalized understanding, returned demonstration, verbal cues required, and needs further education  HOME EXERCISE PROGRAM: Access Code: [email or web address] URL: [email or web address] Date: 11/25/2023 Prepared by: Damien Caulk  Exercises - Standing March with Counter Support  - 1 x daily - 5 x weekly - 1-2 sets - 10 reps - Heel Raises with Counter Support  - 1 x daily - 5 x weekly - 1-2 sets - 10 reps - Standing Hip Abduction with Counter Support  - 1 x daily - 5 x weekly - 1-2 sets - 10 reps - Backward Walking with Counter Support  - 1 x daily - 5 x weekly - 3 sets - 10 reps - Walking with Eyes Closed and Counter Support  - 1 x daily - 5 x weekly - 3 sets - 10 reps - Tandem Walking with Counter Support  - 1 x daily - 5 x weekly - 3 sets - 10 reps - Walking with Head Rotation  - 1 x daily - 5 x weekly - 3 sets - 10 reps  You Can Walk For A Certain Length Of Time Each Day                          Walk 5 minutes 1-2 times per day.             Increase 1-2  minutes every week             Work up to 20 minutes (1x per day).  GOALS: Goals reviewed with patient? Yes  SHORT TERM GOALS: Target date: 12/23/2023  Pt will be independent with initial HEP in order to improve strength and balance in order to decrease fall risk and improve function at home for ADL's. Baseline: Issued initially 11/25/23 Goal status: MET  2.  Assess FGA and update LTG as needed. Baseline: 15/30 (12/01/23) Goal status: MET  3.  Patient will tolerate 5 seconds of single leg stance without loss of balance B LE's to improve ability to get in and out of shower safely. Baseline: 2.78 seconds R LE and 3.43 seconds L LE on Eval; L LE 6.37 seconds and R LE 13.0 seconds 12/17/23 Goal status: MET  4. Patient will be  compliant to formal walking program >/= 3 days per week to improve aerobic tolerance and ambulatory mechanics.  Baseline:  Goal status: PARTIALLY MET (pt using treadmill 3x/week to walk but not doing overground walking program)   LONG TERM GOALS: Target date: 01/06/2024; REVISED to 01/16/24 (d/t LTG's assessed 12/17/23)  Pt will be independent with final HEP in order to improve strength and balance in order to decrease fall risk and improve function at home for ADL's. Baseline: Issued initially 11/25/23 Goal status: INITIAL  2.  Pt will decrease 5 Time Sit  to Stand by at least 3 seconds or more in order to demonstrate clinically significant improvement in LE strength. Baseline: 17.28 seconds on Eval with B UE support; 17.94 seconds (pt intermittently using UE's and no UE's) on 12/17/23 Goal status: INITIAL  3.  Pt will increase by at least 0.13 m/s in order to demonstrate clinically significant improvement in community ambulation.  Baseline: 0.877 m/sec on Eval; 0.87 m/sec on 12/17/23 Goal status: INITIAL  4.  Patient will increase Functional Gait Assessment score to >22/30 as to reduce fall risk and improve dynamic gait safety with community ambulation. Baseline: 15/30 (12/01/23); 19/30 on 12/17/23 Goal status: INITIAL  5.  Pt will demonstrate improved B LE foot clearance for safety with gait. Baseline: decreased B LE step length/foot clearance on Eval; pt reporting able to pick them up a little better on 12/17/23 Goal status: INITIAL  ASSESSMENT:  CLINICAL IMPRESSION: Patient was seen today for physical therapy treatment to address strength and gait.  Focused session on global strengthening, balance during ambulation, and increasing foot clearance to improve gait.  Patient continues to be limited by strength and balance.  They demonstrate improvement in B LE foot clearance during ambulation after use of ankle weights.  They would continue to benefit from skilled PT to address  impairments as noted and progress towards long term goals.  OBJECTIVE IMPAIRMENTS: Abnormal gait, cardiopulmonary status limiting activity, decreased activity tolerance, decreased balance, decreased coordination, decreased endurance, decreased knowledge of condition, decreased knowledge of use of DME, decreased mobility, difficulty walking, decreased strength, and postural dysfunction.   ACTIVITY LIMITATIONS: carrying, lifting, standing, squatting, stairs, transfers, toileting, dressing, and locomotion level  PARTICIPATION LIMITATIONS: cleaning, laundry, driving, shopping, and community activity  PERSONAL FACTORS: Age, Fitness, Past/current experiences, and 1-2 comorbidities: CAD s/p CABG, htn, PSVT are also affecting patient's functional outcome.   REHAB POTENTIAL: Good  CLINICAL DECISION MAKING: Evolving/moderate complexity  EVALUATION COMPLEXITY: Moderate  PLAN:  PT FREQUENCY: 2x/week  PT DURATION: 6 weeks  PLANNED INTERVENTIONS: [id number]- PT Re-evaluation, 97750- Physical Performance Testing, 97110-Therapeutic exercises, 97530- Therapeutic activity, [id number]- Neuromuscular re-education, 97535- Self Care, [id number]- Manual therapy, [id number]- Gait training, [id number]- Orthotic Initial, [phone]- Orthotic/Prosthetic subsequent, [phone]- Aquatic Therapy, [phone]- Electrical stimulation (manual), [phone]- Ultrasound, Patient/Family education, Balance training, Stair training, Taping, Joint mobilization, DME instructions, Cryotherapy, and Moist heat  PLAN FOR NEXT SESSION: Check on HEP; balance; increase B LE foot clearance; obstacle clearance, backwards gait, visually limited conditions, compliant surfaces, blaze pods, terminal hip flexor engagement.  Stride length.   Damien Caulk, PT 01/07/2024, 7:23 PM

## 2024-01-12 ENCOUNTER — Ambulatory Visit: Admitting: Physical Therapy

## 2024-01-12 ENCOUNTER — Encounter: Payer: Self-pay | Admitting: Physical Therapy

## 2024-01-12 VITALS — BP 136/72 | HR 75

## 2024-01-12 DIAGNOSIS — R2689 Other abnormalities of gait and mobility: Secondary | ICD-10-CM | POA: Diagnosis not present

## 2024-01-12 DIAGNOSIS — I639 Cerebral infarction, unspecified: Secondary | ICD-10-CM

## 2024-01-12 DIAGNOSIS — Z9181 History of falling: Secondary | ICD-10-CM

## 2024-01-12 NOTE — Therapy (Signed)
 OUTPATIENT PHYSICAL THERAPY NEURO TREATMENT   Patient Name: Robert Pacheco MRN: [id number] DOB:[birth year], 89 y.o., male Today's Date: 01/12/2024   PCP: [name], DO REFERRING PROVIDER: Raenelle Donalda HERO, MD  END OF SESSION:  PT End of Session - 01/12/24 1536     Visit Number 12    Number of Visits 13   12 plus Eval   Date for Recertification  01/22/24   for scheduling delays   Authorization Type [facility]    Authorization Time Period 11/25/23 - 01/20/24 for 6 visits; 12/22/23 - 02/09/23 for 7 additional visits    Authorization - Visit Number 12    Authorization - Number of Visits 13   6 plus 7   Progress Note Due on Visit 10    PT Start Time 1535    PT Stop Time 1620    PT Time Calculation (min) 45 min    Equipment Utilized During Treatment Gait belt    Activity Tolerance Patient tolerated treatment well    Behavior During Therapy [facility] for tasks assessed/performed           Past Medical History:  Diagnosis Date   Allergy    Coronary artery disease    Hyperlipidemia    Hypertension    Personal history of colonic adenomas 05/10/2012   Skin cancer, basal cell    Past Surgical History:  Procedure Laterality Date   CATARACT EXTRACTION     COLONOSCOPY  multiple   CORONARY ARTERY BYPASS GRAFT  2000   EYE SURGERY     HERNIA REPAIR     MOHS SURGERY     Patient Active Problem List   Diagnosis Date Noted   Transaminitis 11/06/2023   History of TIA (transient ischemic attack) 11/05/2023   Right sided weakness 11/05/2023   Facial droop 11/05/2023   History of atrial tachycardia 11/05/2023   History of CAD (coronary artery disease) 11/05/2023   Acute CVA (cerebrovascular accident) (HCC) 11/05/2023   Elevated liver enzymes 11/05/2023   PSVT (paroxysmal supraventricular tachycardia) 03/27/2021   Senile purpura 09/26/2020   Bilateral leg edema 07/11/2020   Dizziness 07/11/2020   Coronary artery disease involving native coronary artery of native heart  without angina pectoris 07/11/2020   Mixed hyperlipidemia 07/11/2020   Allergy 07/10/2020   Lightheadedness 04/23/2020   Lumbar radiculopathy 08/08/2019   Greater trochanteric pain syndrome of right lower extremity 04/18/2019   Constipation 04/18/2019   Bilateral lower extremity edema 10/15/2018   Displacement of intraocular lens 06/16/2017   Combined forms of age-related cataract of left eye 03/09/2014   Regular astigmatism 03/09/2014   Family history of colon cancer 10/01/2012   History of colonic polyps 05/10/2012   Personal history of colonic adenomas 05/10/2012   BPH (benign prostatic hyperplasia) 03/27/2009   DYSPLASTIC NEVUS, FACE 10/05/2008   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  No pain and no falls reported.  Pt reports no questions with HEP. Pt accompanied by: self and family member (daughter stayed in waiting room)  PERTINENT HISTORY: Hospitalization 11/04/23 to 11/07/23; pt with R facial droop, dysphagia, and R sided weakness; found to have acute L basal ganglia CVA.  ED visit 11/19/24 d/t sudden onset numbness B UE's/LE's (sx's then resolved; no motor symptoms); imaging showing significant DDD with spinal cord mass effect--recommended f/u OP with Neurosurgery; no collar necessary per MD [name].   Per follow-up Neurology note from DO [facility] 11/24/23 pt continues to have spells of numbness involving the arms and leg which occurs a few times per week. Symptoms are not constant. He denies arm/leg weakness or imbalance. He has some neck discomfort, which is worse at night time. He also mentions mild numbness around the lips on both side of the mouth. No problems with speech or swallow.   PMH includes CAD s/p CABG, htn, PSVT, HLD.  PAIN:  Are you having pain? No  PRECAUTIONS: Fall; C-spine imaging showing significant DDD with spinal cord mass effect  RED FLAGS: Cervical red flags: Dysphagia No, Dysmetria No, Diplopia No, Nystagmus No, and Nausea No   WEIGHT BEARING RESTRICTIONS: No  FALLS: Has patient fallen in last 6 months? Yes. Number of falls 1 (fell between bed and end table; skin tear to L UE)  LIVING ENVIRONMENT: Lives with: lives alone Lives in: House/apartment Stairs: Yes: External: 2 steps; none Has following equipment at home: Single point cane, Walker - 2 wheeled, shower chair, and Grab bars.  PLOF: Independent.  Hasn't drove lately (waiting until has more energy back).  Mostly uses walker in bedroom.  PATIENT GOALS: Be able to move a little better.  Improve leg strength.  OBJECTIVE:  Note: Objective measures were completed at Evaluation unless otherwise noted.  DIAGNOSTIC FINDINGS:  MRI Brain 11/05/23: Small acute perforator infarct in the left basal ganglia.   MRI c-spine 11/20/23: 1. Multifactorial degenerative cervical spinal stenosis WITH spinal cord mass effect from C2-C3 through C6-C7. No spinal cord edema or myelomalacia identified. 2. 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 3. Degenerative severe right C4 and C7 nerve level foraminal stenosis, moderate at the left C5, right C6, left C7 levels.  COGNITION: Overall cognitive status: Within functional limits for tasks  assessed   SENSATION: Intact B LE light touch (pt with self reported decreased sensation below knees to feet B)  COORDINATION: Mild decreased B rapid alternating toe tapping Edema: L LE more edematous than R LE   MUSCLE TONE: Intact B LE's  Edema: 28.8 cm L LE malleolar line    30 cm R LE malleolar line    Chronic LE edema per pt report  POSTURE: rounded shoulders, forward head, and posterior pelvic tilt  LOWER EXTREMITY ROM:     Active  Right Eval Left Eval  Hip flexion WNL WNL  Hip extension    Hip abduction    Hip adduction    Hip internal rotation    Hip external rotation    Knee flexion WNL WNL  Knee extension WNL WNL  Ankle dorsiflexion WNL WNL  Ankle plantarflexion WNL WNL  Ankle inversion    Ankle eversion     (Blank rows = not tested)  LOWER EXTREMITY MMT:    MMT Right Eval Left Eval  Hip flexion 5/5 4+/5  Hip extension    Hip abduction  Hip adduction    Hip internal rotation    Hip external rotation    Knee flexion 5/5 4+/5  Knee extension 5/5 4+/5  Ankle dorsiflexion At least 3/5 AROM At least 3/5 AROM  Ankle plantarflexion    Ankle inversion    Ankle eversion    (Blank rows = not tested)  TRANSFERS: Use of B UE's Sit to stand: Modified independence  Assistive device utilized: None     Stand to sit: Modified independence  Assistive device utilized: None      GAIT: Findings: Mild flexed posture; decreased B LE step length/foot clearance;  decreased B LE heel-strike (more foot flat); decreased R UE (compared to L UE) arm swing; clinic distances; SBA  FUNCTIONAL TESTS:  5 times sit to stand: 17.28 seconds; B UE support on Eval.  17.94 seconds (pt intermittently using UE's and no UE's) on 12/17/23 10 meter walk test: 0.877 m/sec (11.40 seconds; no AD use) on Eval.. 0.87 m/sec (11.41 seconds) 12/17/23 SLS: 2.78 seconds R LE and 3.43 seconds L LE on Eval.  L LE 6.37 seconds and R LE 13.0 seconds on 12/17/23 FGA: 15/30 (12/01/23); 19/30 on  12/17/23  FUNCTIONAL GAIT ASSESSMENT  Date: 12/17/23 Score  GAIT LEVEL SURFACE Instructions: Walk at your normal speed from here to the next mark (6 m) [20 ft]. (1) Moderate impairment-Walks 6 m (20 ft), slow speed, abnormal gait pattern, evidence for imbalance, or deviates 25.4 - 38.1 cm (10 -15 in) outside of the 30.48-cm (12-in) walkway width. Requires more than 7 seconds to ambulate 6 m (20 ft). (7.88 seconds)  2.   CHANGE IN GAIT SPEED Instructions: Begin walking at your normal pace (for 1.5 m [5 ft]). When I tell you go, walk as fast as you can (for 1.5 m [5 ft]). When I tell you slow, walk as slowly as you can (for 1.5 m [5 ft]. (2) Mild impairment - Is able to change speed but demonstrates mild gait deviations, deviates 15.24 -25.4 cm (6 -10 in) outside of the 30.48-cm (12-in) walkway width, or no gait deviations but unable to achieve a significant change in velocity, or uses an assistive device  3.    GAIT WITH HORIZONTAL HEAD TURNS Instructions: Walk from here to the next mark 6 m (20 ft) away. Begin walking at your normal pace. Keep walking straight; after 3 steps, turn your head to the right and keep walking straight while looking to the right. After 3 more steps, turn your head to the left and keep walking straight while looking left. Continue alternating looking right and left. (2) Mild impairment - Performs head turns smoothly with slight change in gait velocity (eg, minor disruption to smooth gait path), deviates 15.24 -25.4 cm (6 -10 in) outside 30.48-cm (12-in) walkway width, or uses an assistive device.  4.   GAIT WITH VERTICAL HEAD TURNS Instructions: Walk from here to the next mark (6 m [20 ft]). Begin walking at your normal pace. Keep walking straight; after 3 steps, tip your head up and keep walking straight while looking up. After 3 more steps, tip your head down, keep walking straight while looking down. Continue  alternating looking up and down every 3 steps until you have  completed 2 repetitions in each direction. (2) Mild impairment - Performs task with slight change in gait velocity (eg, minor disruption to smooth gait path), deviates 15.24 -25.4 cm (6 -10 in) outside 30.48-cm (12-in) walkway width or uses assistive device.  5.  GAIT AND  PIVOT TURN Instructions: Begin with walking at your normal pace. When I tell you, turn and stop, turn as quickly as you can to face the opposite direction and stop. (3) Normal - Pivot turns safely within 3 seconds and stops quickly with no loss of balance  6.   STEP OVER OBSTACLE Instructions: Begin walking at your normal speed. When you come to the shoe box, step over it, not around it, and keep walking. (2) Mild impairment - Is able to step over one shoe box (11.43 cm [4.5 in] total height) without changing gait speed; no evidence of imbalance.  7.   GAIT WITH NARROW BASE OF SUPPORT Instructions: Walk on the floor with arms folded across the chest, feet aligned heel to toe in tandem for a distance of 3.6 m [12 ft]. The number of steps taken in a straight line are counted for a maximum of 10 steps. (2) Mild impairment - Ambulates 7-9 steps  8.   GAIT WITH EYES CLOSED Instructions: Walk at your normal speed from here to the next mark (6 m [20 ft]) with your eyes closed. (1) Moderate impairment - Walks 6 m (20 ft), slow speed, abnormal gait pattern, evidence for imbalance, deviates 25.4 -38.1 cm (10 -15 in) outside 30.48-cm (12-in) walkway width. Requires more than 9 seconds to ambulate 6 m (20 ft). (10.19 seconds)  9.   AMBULATING BACKWARDS Instructions: Walk backwards until I tell you to stop (2) Mild impairment - Walks 6 m (20 ft), uses assistive device, slower speed, mild gait deviations, deviates 15.24 -25.4 cm (6 -10 in) outside 30.48-cm (12-in) walkway width (16.72 seconds)  10. STEPS Instructions: Walk up these stairs as you would at home (ie, using the rail if necessary). At the top turn around and walk down. (2) Mild  impairment-Alternating feet, must use rail.  Total 19/30   Interpretation of scores: Non-Specific Older Adults Cutoff Score: <=22/30 = risk of falls Parkinsons Disease Cutoff score <15/30= fall risk (Hoehn & Yahr 1-4)  Minimally Clinically Important Difference (MCID)  Stroke (acute, subacute, and chronic) = MDC: 4.2 points Vestibular (acute) = MDC: 6 points Community Dwelling Older Adults =  MCID: 4 points Parkinsons Disease  =  MDC: 4.3 points  (Academy of Neurologic Physical Therapy (nd). Functional Gait Assessment. Retrieved from https://www.neuropt.org/docs/default-source/cpgs/core-outcome-measures/function-gait-assessment-pocket-guide-proof9-(2).pdf?sfvrsn=b73f35043_0.)                                                                                                                       TREATMENT DATE: 01/12/24  Self Care: BP and HR taken in sitting at rest beginning of session (see below for details). Vitals:   01/12/24 1540  BP: 136/72  Pulse: 75   Therapeutic Exercise (reviewed HEP): Standing March with Counter Support x10 reps B LE's Heel Raises with Counter Support x10 reps Standing Hip Abduction with Counter Support x5 reps B LE's Backward Walking with Counter Support  x2 reps (x8 feet) Walking with Eyes Closed and Counter Support x2 reps (x8 feet) Tandem Walking  with Counter Support  x2 reps (x8 feet) Walking with Head Rotation with Counter Support x2 reps (x8 feet) SciFit multi-peaks up to level 4 for 8 minutes using BUE/BLEs for global strengthening, dynamic cardiovascular activity and increased amplitude of stepping. RPE of 7-8/10 following activity.  Average stride length 11.3 inches.  Therapeutic Activity: Balance on 9 foot foam balance beam: Sidestepping L/R (R then L =1 rep): x4 reps (intermittent UE support for balance; SBA for safety) Heel to toe tandem walking (1 rep =1 length of balance beam): x8 reps walking forward (intermittent UE support for balance;  SBA) Ambulation with black [place] resistance band around pelvis with random perturbations/resistance: x275 feet (CGA; pt able to self correct balance when challenged)  PATIENT EDUCATION: Education details: Continue HEP.  Remaining visit left in POC (1 more session); plan to discharge from therapy next session. Person educated: Patient Education method: Explanation, Demonstration, and Verbal cues Education comprehension: verbalized understanding, returned demonstration, verbal cues required, and needs further education  HOME EXERCISE PROGRAM: Access Code: [email or web address] URL: [email or web address] Date: 11/25/2023 Prepared by: Damien Caulk  Exercises - Standing March with Counter Support  - 1 x daily - 5 x weekly - 1-2 sets - 10 reps - Heel Raises with Counter Support  - 1 x daily - 5 x weekly - 1-2 sets - 10 reps - Standing Hip Abduction with Counter Support  - 1 x daily - 5 x weekly - 1-2 sets - 10 reps - Backward Walking with Counter Support  - 1 x daily - 5 x weekly - 3 sets - 10 reps - Walking with Eyes Closed and Counter Support  - 1 x daily - 5 x weekly - 3 sets - 10 reps - Tandem Walking with Counter Support  - 1 x daily - 5 x weekly - 3 sets - 10 reps - Walking with Head Rotation  - 1 x daily - 5 x weekly - 3 sets - 10 reps  You Can Walk For A Certain Length Of Time Each Day                          Walk 5 minutes 1-2 times per day.             Increase 1-2  minutes every week             Work up to 20 minutes (1x per day).  GOALS: Goals reviewed with patient? Yes  SHORT TERM GOALS: Target date: 12/23/2023  Pt will be independent with initial HEP in order to improve strength and balance in order to decrease fall risk and improve function at home for ADL's. Baseline: Issued initially 11/25/23 Goal status: MET  2.  Assess FGA and update LTG as needed. Baseline: 15/30 (12/01/23) Goal status: MET  3.  Patient will tolerate 5 seconds of single leg stance  without loss of balance B LE's to improve ability to get in and out of shower safely. Baseline: 2.78 seconds R LE and 3.43 seconds L LE on Eval; L LE 6.37 seconds and R LE 13.0 seconds 12/17/23 Goal status: MET  4. Patient will be compliant to formal walking program >/= 3 days per week to improve aerobic tolerance and ambulatory mechanics.  Baseline:  Goal status: PARTIALLY MET (pt using treadmill 3x/week to walk but not doing overground walking program)   LONG TERM GOALS: Target date: 01/06/2024; REVISED to 01/16/24 (d/t LTG's assessed 12/17/23)  Pt will be independent with  final HEP in order to improve strength and balance in order to decrease fall risk and improve function at home for ADL's. Baseline: Issued initially 11/25/23 Goal status: INITIAL  2.  Pt will decrease 5 Time Sit to Stand by at least 3 seconds or more in order to demonstrate clinically significant improvement in LE strength. Baseline: 17.28 seconds on Eval with B UE support; 17.94 seconds (pt intermittently using UE's and no UE's) on 12/17/23 Goal status: INITIAL  3.  Pt will increase by at least 0.13 m/s in order to demonstrate clinically significant improvement in community ambulation.  Baseline: 0.877 m/sec on Eval; 0.87 m/sec on 12/17/23 Goal status: INITIAL  4.  Patient will increase Functional Gait Assessment score to >22/30 as to reduce fall risk and improve dynamic gait safety with community ambulation. Baseline: 15/30 (12/01/23); 19/30 on 12/17/23 Goal status: INITIAL  5.  Pt will demonstrate improved B LE foot clearance for safety with gait. Baseline: decreased B LE step length/foot clearance on Eval; pt reporting able to pick them up a little better on 12/17/23 Goal status: INITIAL  ASSESSMENT:  CLINICAL IMPRESSION: Patient was seen today for physical therapy treatment to address HEP, balance, and strength.  Focused session on reviewing HEP (pt demonstrating appropriate technique and  understanding); balance on compliant surface; and global strengthening.  Patient continues to be limited by strength and balance.  They demonstrate improvement in balance reaction response with repetition of activity; improved activity tolerance also noted in general.  They would continue to benefit from skilled PT to address impairments as noted and progress towards long term goals.  Pt aware next session is last planned therapy session.  OBJECTIVE IMPAIRMENTS: Abnormal gait, cardiopulmonary status limiting activity, decreased activity tolerance, decreased balance, decreased coordination, decreased endurance, decreased knowledge of condition, decreased knowledge of use of DME, decreased mobility, difficulty walking, decreased strength, and postural dysfunction.   ACTIVITY LIMITATIONS: carrying, lifting, standing, squatting, stairs, transfers, toileting, dressing, and locomotion level  PARTICIPATION LIMITATIONS: cleaning, laundry, driving, shopping, and community activity  PERSONAL FACTORS: Age, Fitness, Past/current experiences, and 1-2 comorbidities: CAD s/p CABG, htn, PSVT are also affecting patient's functional outcome.   REHAB POTENTIAL: Good  CLINICAL DECISION MAKING: Evolving/moderate complexity  EVALUATION COMPLEXITY: Moderate  PLAN:  PT FREQUENCY: 2x/week  PT DURATION: 6 weeks  PLANNED INTERVENTIONS: [id number]- PT Re-evaluation, 97750- Physical Performance Testing, 97110-Therapeutic exercises, 97530- Therapeutic activity, [id number]- Neuromuscular re-education, 97535- Self Care, [id number]- Manual therapy, [id number]- Gait training, [phone]- Orthotic Initial, [phone]- Orthotic/Prosthetic subsequent, [phone]- Aquatic Therapy, [phone]- Electrical stimulation (manual), [phone]- Ultrasound, Patient/Family education, Balance training, Stair training, Taping, Joint mobilization, DME instructions, Cryotherapy, and Moist heat  PLAN FOR NEXT SESSION: LTG's due; plan to discharge from therapy (last visit).  Check on HEP;  balance; increase B LE foot clearance; obstacle clearance, backwards gait, visually limited conditions, compliant surfaces, blaze pods, terminal hip flexor engagement.   Damien Caulk, PT 01/12/2024, 4:42 PM

## 2024-01-14 ENCOUNTER — Ambulatory Visit: Admitting: Physical Therapy

## 2024-01-14 ENCOUNTER — Encounter: Payer: Self-pay | Admitting: Physical Therapy

## 2024-01-14 VITALS — BP 158/68 | HR 82

## 2024-01-14 DIAGNOSIS — I639 Cerebral infarction, unspecified: Secondary | ICD-10-CM

## 2024-01-14 DIAGNOSIS — R2689 Other abnormalities of gait and mobility: Secondary | ICD-10-CM

## 2024-01-14 DIAGNOSIS — Z9181 History of falling: Secondary | ICD-10-CM

## 2024-01-14 NOTE — Therapy (Signed)
 OUTPATIENT PHYSICAL THERAPY NEURO TREATMENT--DISCHARGE  PHYSICAL THERAPY DISCHARGE SUMMARY  Visits from Start of Care: 13  Current functional level related to goals / functional outcomes: Independent with ambulation   Remaining deficits: Higher level balance.   Education / Equipment: Continue HEP.   Patient agrees to discharge. Patient goals were mostly met (4/5 Long term goals). Patient is being discharged due to meeting 4/5 LTG's and being pleased with the current functional level.  Damien Caulk, PT 01/14/2024, 4:33 PM   Patient Name: Robert Pacheco MRN: [id number] DOB:02/16/1933, 89 y.o., male Today's Date: 01/14/2024   PCP: [name], DO REFERRING PROVIDER: Raenelle Donalda HERO, MD  END OF SESSION:  PT End of Session - 01/14/24 1533     Visit Number 13    Number of Visits 13   12 plus Eval   Date for Recertification  01/22/24   for scheduling delays   Authorization Type [facility] Medicare    Authorization Time Period 11/25/23 - 01/20/24 for 6 visits; 12/22/23 - 02/09/23 for 7 additional visits    Authorization - Visit Number 13    Authorization - Number of Visits 13   6 plus 7   Progress Note Due on Visit 10    PT Start Time --   pt needing to use restroom beginning of session   Equipment Utilized During Treatment Gait belt    Activity Tolerance Patient tolerated treatment well    Behavior During Therapy [facility] for tasks assessed/performed           Past Medical History:  Diagnosis Date   Allergy    Coronary artery disease    Hyperlipidemia    Hypertension    Personal history of colonic adenomas 05/10/2012   Skin cancer, basal cell    Past Surgical History:  Procedure Laterality Date   CATARACT EXTRACTION     COLONOSCOPY  multiple   CORONARY ARTERY BYPASS GRAFT  2000   EYE SURGERY     HERNIA REPAIR     MOHS SURGERY     Patient Active Problem List   Diagnosis Date Noted   Transaminitis 11/06/2023   History of TIA (transient ischemic  attack) 11/05/2023   Right sided weakness 11/05/2023   Facial droop 11/05/2023   History of atrial tachycardia 11/05/2023   History of CAD (coronary artery disease) 11/05/2023   Acute CVA (cerebrovascular accident) (HCC) 11/05/2023   Elevated liver enzymes 11/05/2023   PSVT (paroxysmal supraventricular tachycardia) 03/27/2021   Senile purpura 09/26/2020   Bilateral leg edema 07/11/2020   Dizziness 07/11/2020   Coronary artery disease involving native coronary artery of native heart without angina pectoris 07/11/2020   Mixed hyperlipidemia 07/11/2020   Allergy 07/10/2020   Lightheadedness 04/23/2020   Lumbar radiculopathy 08/08/2019   Greater trochanteric pain syndrome of right lower extremity 04/18/2019   Constipation 04/18/2019   Bilateral lower extremity edema 10/15/2018   Displacement of intraocular lens 06/16/2017   Combined forms of age-related cataract of left eye 03/09/2014   Regular astigmatism 03/09/2014   Family history of colon cancer 10/01/2012   History of colonic polyps 05/10/2012   Personal history of colonic adenomas 05/10/2012   BPH (benign prostatic hyperplasia) 03/27/2009   DYSPLASTIC NEVUS, FACE 10/05/2008   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  Rationale for Evaluation and Treatment: Rehabilitation  SUBJECTIVE:                                                                                                                                                                                             SUBJECTIVE STATEMENT: No recent falls.  No pain reported.  No acute changes.  Pt reports still doing HEP; no concerns. Pt accompanied by: self and family member (daughter  stayed in waiting room)  PERTINENT HISTORY: Hospitalization 11/04/23 to 11/07/23; pt with R facial droop, dysphagia, and R sided weakness; found to have acute L basal ganglia CVA.  ED visit 11/19/24 d/t sudden onset numbness B UE's/LE's (sx's then resolved; no motor symptoms); imaging showing significant DDD with spinal cord mass effect--recommended f/u OP with Neurosurgery; no collar necessary per MD [name].  Per follow-up Neurology note from [facility] 11/24/23 pt continues to have spells of numbness involving the arms and leg which occurs a few times per week. Symptoms are not constant. He denies arm/leg weakness or imbalance. He has some neck discomfort, which is worse at night time. He also mentions mild numbness around the lips on both side of the mouth. No problems with speech or swallow.   PMH includes CAD s/p CABG, htn, PSVT, HLD.  PAIN:  Are you having pain? No  PRECAUTIONS: Fall; C-spine imaging showing significant DDD with spinal cord mass effect  RED FLAGS: Cervical red flags: Dysphagia No, Dysmetria No, Diplopia No, Nystagmus No, and Nausea No   WEIGHT BEARING RESTRICTIONS: No  FALLS: Has patient fallen in last 6 months? Yes. Number of falls 1 (fell between bed and end table; skin tear to L UE)  LIVING ENVIRONMENT: Lives with: lives alone Lives in: House/apartment Stairs: Yes: External: 2 steps; none Has following equipment at home: Single point cane, Walker - 2 wheeled, shower chair, and Grab bars.  PLOF: Independent.  Hasn't drove lately (waiting until has more energy back).  Mostly uses walker in bedroom.  PATIENT GOALS: Be able to move a little better.  Improve leg strength.  OBJECTIVE:  Note: Objective measures were completed at Evaluation unless otherwise noted.  DIAGNOSTIC FINDINGS:  MRI Brain 11/05/23: Small acute perforator infarct in the left basal ganglia.   MRI c-spine 11/20/23: 1. Multifactorial degenerative cervical spinal stenosis WITH spinal cord  mass effect from C2-C3 through C6-C7. No spinal cord edema or myelomalacia identified. 2. 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 3. Degenerative severe right C4 and C7 nerve level foraminal stenosis, moderate at the left C5,  right C6, left C7 levels.  COGNITION: Overall cognitive status: Within functional limits for tasks assessed   SENSATION: Intact B LE light touch (pt with self reported decreased sensation below knees to feet B)  COORDINATION: Mild decreased B rapid alternating toe tapping Edema: L LE more edematous than R LE   MUSCLE TONE: Intact B LE's  Edema: 28.8 cm L LE malleolar line    30 cm R LE malleolar line    Chronic LE edema per pt report  POSTURE: rounded shoulders, forward head, and posterior pelvic tilt  LOWER EXTREMITY ROM:     Active  Right Eval Left Eval  Hip flexion WNL WNL  Hip extension    Hip abduction    Hip adduction    Hip internal rotation    Hip external rotation    Knee flexion WNL WNL  Knee extension WNL WNL  Ankle dorsiflexion WNL WNL  Ankle plantarflexion WNL WNL  Ankle inversion    Ankle eversion     (Blank rows = not tested)  LOWER EXTREMITY MMT:    MMT Right Eval Left Eval  Hip flexion 5/5 4+/5  Hip extension    Hip abduction    Hip adduction    Hip internal rotation    Hip external rotation    Knee flexion 5/5 4+/5  Knee extension 5/5 4+/5  Ankle dorsiflexion At least 3/5 AROM At least 3/5 AROM  Ankle plantarflexion    Ankle inversion    Ankle eversion    (Blank rows = not tested)  TRANSFERS: Use of B UE's Sit to stand: Modified independence  Assistive device utilized: None     Stand to sit: Modified independence  Assistive device utilized: None      GAIT: Findings: Mild flexed posture; decreased B LE step length/foot clearance;  decreased B LE heel-strike (more foot flat); decreased R UE (compared to L UE) arm swing; clinic distances;  SBA  FUNCTIONAL TESTS:  5 times sit to stand: 17.28 seconds; B UE support on Eval.  17.94 seconds (pt intermittently using UE's and no UE's) on 12/17/23; 10.69 seconds using B UE's 01/14/24 10 meter walk test: 0.877 m/sec (11.40 seconds; no AD use) on Eval.. 0.87 m/sec (11.41 seconds) 12/17/23.  .90 m/sec (11.04 seconds) 01/14/24 SLS: 2.78 seconds R LE and 3.43 seconds L LE on Eval.  L LE 6.37 seconds and R LE 13.0 seconds on 12/17/23 FGA: 15/30 (12/01/23); 19/30 on 12/17/23  FUNCTIONAL GAIT ASSESSMENT  Date: 12/17/23 Score  GAIT LEVEL SURFACE Instructions: Walk at your normal speed from here to the next mark (6 m) [20 ft]. (1) Moderate impairment-Walks 6 m (20 ft), slow speed, abnormal gait pattern, evidence for imbalance, or deviates 25.4 - 38.1 cm (10 -15 in) outside of the 30.48-cm (12-in) walkway width. Requires more than 7 seconds to ambulate 6 m (20 ft). (7.88 seconds)  2.   CHANGE IN GAIT SPEED Instructions: Begin walking at your normal pace (for 1.5 m [5 ft]). When I tell you go, walk as fast as you can (for 1.5 m [5 ft]). When I tell you slow, walk as slowly as you can (for 1.5 m [5 ft]. (2) Mild impairment - Is able to change speed but demonstrates mild gait deviations, deviates 15.24 -25.4 cm (6 -10 in) outside of the 30.48-cm (12-in) walkway width, or no gait deviations but unable to achieve a significant change in velocity, or uses an assistive device  3.    GAIT WITH HORIZONTAL HEAD TURNS Instructions:  Walk from here to the next mark 6 m (20 ft) away. Begin walking at your normal pace. Keep walking straight; after 3 steps, turn your head to the right and keep walking straight while looking to the right. After 3 more steps, turn your head to the left and keep walking straight while looking left. Continue alternating looking right and left. (2) Mild impairment - Performs head turns smoothly with slight change in gait velocity (eg, minor disruption to smooth gait path), deviates 15.24  -25.4 cm (6 -10 in) outside 30.48-cm (12-in) walkway width, or uses an assistive device.  4.   GAIT WITH VERTICAL HEAD TURNS Instructions: Walk from here to the next mark (6 m [20 ft]). Begin walking at your normal pace. Keep walking straight; after 3 steps, tip your head up and keep walking straight while looking up. After 3 more steps, tip your head down, keep walking straight while looking down. Continue  alternating looking up and down every 3 steps until you have completed 2 repetitions in each direction. (2) Mild impairment - Performs task with slight change in gait velocity (eg, minor disruption to smooth gait path), deviates 15.24 -25.4 cm (6 -10 in) outside 30.48-cm (12-in) walkway width or uses assistive device.  5.  GAIT AND PIVOT TURN Instructions: Begin with walking at your normal pace. When I tell you, turn and stop, turn as quickly as you can to face the opposite direction and stop. (3) Normal - Pivot turns safely within 3 seconds and stops quickly with no loss of balance  6.   STEP OVER OBSTACLE Instructions: Begin walking at your normal speed. When you come to the shoe box, step over it, not around it, and keep walking. (2) Mild impairment - Is able to step over one shoe box (11.43 cm [4.5 in] total height) without changing gait speed; no evidence of imbalance.  7.   GAIT WITH NARROW BASE OF SUPPORT Instructions: Walk on the floor with arms folded across the chest, feet aligned heel to toe in tandem for a distance of 3.6 m [12 ft]. The number of steps taken in a straight line are counted for a maximum of 10 steps. (2) Mild impairment - Ambulates 7-9 steps  8.   GAIT WITH EYES CLOSED Instructions: Walk at your normal speed from here to the next mark (6 m [20 ft]) with your eyes closed. (1) Moderate impairment - Walks 6 m (20 ft), slow speed, abnormal gait pattern, evidence for imbalance, deviates 25.4 -38.1 cm (10 -15 in) outside 30.48-cm (12-in) walkway width. Requires more than 9  seconds to ambulate 6 m (20 ft). (10.19 seconds)  9.   AMBULATING BACKWARDS Instructions: Walk backwards until I tell you to stop (2) Mild impairment - Walks 6 m (20 ft), uses assistive device, slower speed, mild gait deviations, deviates 15.24 -25.4 cm (6 -10 in) outside 30.48-cm (12-in) walkway width (16.72 seconds)  10. STEPS Instructions: Walk up these stairs as you would at home (ie, using the rail if necessary). At the top turn around and walk down. (2) Mild impairment-Alternating feet, must use rail.  Total 19/30   Interpretation of scores: Non-Specific Older Adults Cutoff Score: <=22/30 = risk of falls Parkinsons Disease Cutoff score <15/30= fall risk (Hoehn & Yahr 1-4)  Minimally Clinically Important Difference (MCID)  Stroke (acute, subacute, and chronic) = MDC: 4.2 points Vestibular (acute) = MDC: 6 points Community Dwelling Older Adults =  MCID: 4 points Parkinsons Disease  =  MDC: 4.3 points  (  Academy of Neurologic Physical Therapy (nd). Functional Gait Assessment. Retrieved from https://www.neuropt.org/docs/default-source/cpgs/core-outcome-measures/function-gait-assessment-pocket-guide-proof9-(2).pdf?sfvrsn=b49f35043_0.)                                                                                                                       TREATMENT DATE: 01/14/24  Self Care: BP and HR taken in sitting at rest beginning of session (see below for details). Vitals:   01/14/24 1537  BP: (!) 158/68  Pulse: 82    Therapeutic Activities: 5 time sit to stand: 10.69 seconds using B UE's : .90 m/sec (11.04 seconds)  Physical Performance Testing: FGA: 22/30 (see below for details) FUNCTIONAL GAIT ASSESSMENT  Date: 01/14/24 Score  GAIT LEVEL SURFACE Instructions: Walk at your normal speed from here to the next mark (6 m) [20 ft]. (2) Mild impairment - Walks 6 m (20 ft) in less than 7 seconds but greater than 5.5 seconds, uses assistive device, slower speed, mild gait  deviations, or deviates 15.24 -25.4 cm (6 -10 in) outside of the 30.48-cm (12-in) walkway width. 6.81 seconds  2.   CHANGE IN GAIT SPEED Instructions: Begin walking at your normal pace (for 1.5 m [5 ft]). When I tell you go, walk as fast as you can (for 1.5 m [5 ft]). When I tell you slow, walk as slowly as you can (for 1.5 m [5 ft]. (3) Normal - Able to smoothly change walking speed without loss of balance or gait deviation. Shows a significant difference in walking speeds between normal, fast, and slow speeds. Deviates no more than 15.24 cm (6 in) outside of the 30.48-cm (12-in) walkway width.  3.    GAIT WITH HORIZONTAL HEAD TURNS Instructions: Walk from here to the next mark 6 m (20 ft) away. Begin walking at your normal pace. Keep walking straight; after 3 steps, turn your head to the right and keep walking straight while looking to the right. After 3 more steps, turn your head to the left and keep walking straight while looking left. Continue alternating looking right and left. (3) Normal - Performs head turns smoothly with no change in gait. Deviates no more than 15.24 cm (6 in) outside 30.48-cm (12-in) walkway width.  4.   GAIT WITH VERTICAL HEAD TURNS Instructions: Walk from here to the next mark (6 m [20 ft]). Begin walking at your normal pace. Keep walking straight; after 3 steps, tip your head up and keep walking straight while looking up. After 3 more steps, tip your head down, keep walking straight while looking down. Continue  alternating looking up and down every 3 steps until you have completed 2 repetitions in each direction. (3) Normal - Performs head turns with no change in gait. Deviates no more than 15.24 cm (6 in) outside 30.48-cm (12-in) walkway width.  5.  GAIT AND PIVOT TURN Instructions: Begin with walking at your normal pace. When I tell you, turn and stop, turn as quickly as you can to face the opposite direction and stop. (3) Normal - Pivot turns  safely within 3 seconds  and stops quickly with no loss of balance  6.   STEP OVER OBSTACLE Instructions: Begin walking at your normal speed. When you come to the shoe box, step over it, not around it, and keep walking. (2) Mild impairment - Is able to step over one shoe box (11.43 cm [4.5 in] total height) without changing gait speed; no evidence of imbalance.  7.   GAIT WITH NARROW BASE OF SUPPORT Instructions: Walk on the floor with arms folded across the chest, feet aligned heel to toe in tandem for a distance of 3.6 m [12 ft]. The number of steps taken in a straight line are counted for a maximum of 10 steps. (1) Moderate impairment - Ambulates 4 -7 steps.  8.   GAIT WITH EYES CLOSED Instructions: Walk at your normal speed from here to the next mark (6 m [20 ft]) with your eyes closed. (1) Moderate impairment - Walks 6 m (20 ft), slow speed, abnormal gait pattern, evidence for imbalance, deviates 25.4 -38.1 cm (10 -15 in) outside 30.48-cm (12-in) walkway width. Requires more than 9 seconds to ambulate 6 m (20 ft).  11.78 seconds; veers to R.  9.   AMBULATING BACKWARDS Instructions: Walk backwards until I tell you to stop (2) Mild impairment - Walks 6 m (20 ft), uses assistive device, slower speed, mild gait deviations, deviates 15.24 -25.4 cm (6 -10 in) outside 30.48-cm (12-in) walkway width.  17.47 seconds  10. STEPS Instructions: Walk up these stairs as you would at home (ie, using the rail if necessary). At the top turn around and walk down. (2) Mild impairment-Alternating feet, must use rail.  Total 22/30   Interpretation of scores: Non-Specific Older Adults Cutoff Score: <=22/30 = risk of falls Parkinsons Disease Cutoff score <15/30= fall risk (Hoehn & Yahr 1-4)  Minimally Clinically Important Difference (MCID)  Stroke (acute, subacute, and chronic) = MDC: 4.2 points Vestibular (acute) = MDC: 6 points Community Dwelling Older Adults =  MCID: 4 points Parkinsons Disease  =  MDC: 4.3 points  (Academy of  Neurologic Physical Therapy (nd). Functional Gait Assessment. Retrieved from https://www.neuropt.org/docs/default-source/cpgs/core-outcome-measures/function-gait-assessment-pocket-guide-proof9-(2).pdf?sfvrsn=b20f35043_0.)  PATIENT EDUCATION: Education details: Continue HEP.  Plan to graduate from therapy today. Person educated: Patient Education method: Explanation, Demonstration, and Verbal cues Education comprehension: verbalized understanding, returned demonstration, verbal cues required, and needs further education  HOME EXERCISE PROGRAM: Access Code: [email or web address] URL: [email or web address] Date: 11/25/2023 Prepared by: Damien Caulk  Exercises - Standing March with Counter Support  - 1 x daily - 5 x weekly - 1-2 sets - 10 reps - Heel Raises with Counter Support  - 1 x daily - 5 x weekly - 1-2 sets - 10 reps - Standing Hip Abduction with Counter Support  - 1 x daily - 5 x weekly - 1-2 sets - 10 reps - Backward Walking with Counter Support  - 1 x daily - 5 x weekly - 3 sets - 10 reps - Walking with Eyes Closed and Counter Support  - 1 x daily - 5 x weekly - 3 sets - 10 reps - Tandem Walking with Counter Support  - 1 x daily - 5 x weekly - 3 sets - 10 reps - Walking with Head Rotation  - 1 x daily - 5 x weekly - 3 sets - 10 reps  You Can Walk For A Certain Length Of Time Each Day  Walk 5 minutes 1-2 times per day.             Increase 1-2  minutes every week             Work up to 20 minutes (1x per day).  GOALS: Goals reviewed with patient? Yes  SHORT TERM GOALS: Target date: 12/23/2023  Pt will be independent with initial HEP in order to improve strength and balance in order to decrease fall risk and improve function at home for ADL's. Baseline: Issued initially 11/25/23 Goal status: MET  2.  Assess FGA and update LTG as needed. Baseline: 15/30 (12/01/23) Goal status: MET  3.  Patient will tolerate 5 seconds of single leg stance without  loss of balance B LE's to improve ability to get in and out of shower safely. Baseline: 2.78 seconds R LE and 3.43 seconds L LE on Eval; L LE 6.37 seconds and R LE 13.0 seconds 12/17/23 Goal status: MET  4. Patient will be compliant to formal walking program >/= 3 days per week to improve aerobic tolerance and ambulatory mechanics.  Baseline:  Goal status: PARTIALLY MET (pt using treadmill 3x/week to walk but not doing overground walking program)   LONG TERM GOALS: Target date: 01/06/2024; REVISED to 01/16/24 (d/t LTG's assessed 12/17/23)  Pt will be independent with final HEP in order to improve strength and balance in order to decrease fall risk and improve function at home for ADL's. Baseline: Issued initially 11/25/23 Goal status: MET  2.  Pt will decrease 5 Time Sit to Stand by at least 3 seconds or more in order to demonstrate clinically significant improvement in LE strength. Baseline: 17.28 seconds on Eval with B UE support; 17.94 seconds (pt intermittently using UE's and no UE's) on 12/17/23; 10.69 seconds using B UE's 01/14/24 Goal status: MET  3.  Pt will increase by at least 0.13 m/s in order to demonstrate clinically significant improvement in community ambulation.  Baseline: 0.877 m/sec on Eval; 0.87 m/sec on 12/17/23; 0.90 m/sec (11.04 seconds) 01/14/24 Goal status: PROGRESSING (pt prefers to not walk too fast)  4.  Patient will increase Functional Gait Assessment score to >22/30 as to reduce fall risk and improve dynamic gait safety with community ambulation. Baseline: 15/30 (12/01/23); 19/30 on 12/17/23; 22/30 Goal status: MET  5.  Pt will demonstrate improved B LE foot clearance for safety with gait. Baseline: decreased B LE step length/foot clearance on Eval; pt reporting able to pick them up a little better on 12/17/23 Goal status: MET  ASSESSMENT:  CLINICAL IMPRESSION: Patient was seen today for physical therapy treatment to address LTG's and POC.  Focused  session on assessing outcome measures including 5 time sit to stand, , and  FGA.  Pt scored 10.69 seconds on the 5 time sit to stand test indicating pt is not at increased risk of falls (>15 seconds = increased risk of falls); improved from 17.94 seconds on 12/17/23.  Pt scored 0.90 m/sec on the 10 Meter Walk Test indicating pt is a community ambulatory (Cut off scores: <0.4 m/s = household Ambulator, 0.4-0.8 m/s = limited community Ambulator, >0.8 m/s = community Ambulator, >1.2 m/s = crossing a street, <1.0 = increased fall risk); improved from 0.87 m/sec on 12/17/23.  Pt scored 22/30 on FGA indicating pt is at risk of falls (</= 22/30 = risk of falls in elderly); improved from 19/30 on 12/17/23.  Pt met 4/5 LTG's and progressing with 1 LTG ( goal progressing; pt reports he  prefers not to walk too fast).  Pt appears pleased with current status and progress.  Will plan to discharge (graduate) from PT today.  OBJECTIVE IMPAIRMENTS: Abnormal gait, cardiopulmonary status limiting activity, decreased activity tolerance, decreased balance, decreased coordination, decreased endurance, decreased knowledge of condition, decreased knowledge of use of DME, decreased mobility, difficulty walking, decreased strength, and postural dysfunction.   ACTIVITY LIMITATIONS: carrying, lifting, standing, squatting, stairs, transfers, toileting, dressing, and locomotion level  PARTICIPATION LIMITATIONS: cleaning, laundry, driving, shopping, and community activity  PERSONAL FACTORS: Age, Fitness, Past/current experiences, and 1-2 comorbidities: CAD s/p CABG, htn, PSVT are also affecting patient's functional outcome.   REHAB POTENTIAL: Good  CLINICAL DECISION MAKING: Evolving/moderate complexity  EVALUATION COMPLEXITY: Moderate  PLAN:  PT FREQUENCY: 2x/week  PT DURATION: 6 weeks  PLANNED INTERVENTIONS: [id number]- PT Re-evaluation, 97750- Physical Performance Testing, 97110-Therapeutic exercises, 97530-  Therapeutic activity, [id number]- Neuromuscular re-education, 97535- Self Care, [id number]- Manual therapy, [id number]- Gait training, [id number]- Orthotic Initial, [phone]- Orthotic/Prosthetic subsequent, [phone]- Aquatic Therapy, [phone]- Electrical stimulation (manual), [phone]- Ultrasound, Patient/Family education, Balance training, Stair training, Taping, Joint mobilization, DME instructions, Cryotherapy, and Moist heat  PLAN FOR NEXT SESSION: Discharge from PT today.   Damien Caulk, PT 01/14/2024, 3:40 PM

## 2024-02-01 ENCOUNTER — Other Ambulatory Visit (HOSPITAL_COMMUNITY): Payer: Self-pay

## 2024-02-08 ENCOUNTER — Telehealth: Payer: Self-pay

## 2024-02-08 NOTE — Telephone Encounter (Signed)
 Copied from CRM #[id number]. Topic: Clinical - Medication Question >> Feb 08, 2024  3:02 PM Wess RAMAN wrote: Reason for CRM: Patient would like to speak with someone in the office about his medications. He would like to know if he should be taking Meloxicam  7.5 mg  Callback #: [phone]  Called pt regarding Meloxicam  and pt said he's not taking it. He said his out but do he need it, because the  TYLENOL  is working fine. Pt was advised if TYLENOL  is working than we will don't need Meloxicam , pt stated understand.

## 2024-03-31 ENCOUNTER — Ambulatory Visit: Payer: Medicare Other

## 2024-04-11 ENCOUNTER — Ambulatory Visit: Admitting: Family Medicine

## 2024-05-24 ENCOUNTER — Ambulatory Visit: Admitting: Neurology
